# Patient Record
Sex: Female | Born: 1980 | Race: White | Hispanic: No | Marital: Single | Smoking: Never smoker
Health system: Southern US, Community
[De-identification: ages and names within clinical notes are randomized; demographics above are authoritative.]

## PROBLEM LIST (undated history)

## (undated) DIAGNOSIS — E559 Vitamin D deficiency, unspecified: Secondary | ICD-10-CM

## (undated) DIAGNOSIS — M5481 Occipital neuralgia: Secondary | ICD-10-CM

## (undated) DIAGNOSIS — G43909 Migraine, unspecified, not intractable, without status migrainosus: Secondary | ICD-10-CM

## (undated) DIAGNOSIS — D649 Anemia, unspecified: Secondary | ICD-10-CM

## (undated) DIAGNOSIS — D509 Iron deficiency anemia, unspecified: Secondary | ICD-10-CM

## (undated) DIAGNOSIS — R9431 Abnormal electrocardiogram [ECG] [EKG]: Secondary | ICD-10-CM

## (undated) DIAGNOSIS — Z87442 Personal history of urinary calculi: Secondary | ICD-10-CM

## (undated) DIAGNOSIS — N200 Calculus of kidney: Secondary | ICD-10-CM

## (undated) DIAGNOSIS — N938 Other specified abnormal uterine and vaginal bleeding: Secondary | ICD-10-CM

## (undated) DIAGNOSIS — K589 Irritable bowel syndrome without diarrhea: Secondary | ICD-10-CM

## (undated) DIAGNOSIS — E669 Obesity, unspecified: Secondary | ICD-10-CM

## (undated) DIAGNOSIS — Z9889 Other specified postprocedural states: Secondary | ICD-10-CM

## (undated) DIAGNOSIS — G43119 Migraine with aura, intractable, without status migrainosus: Secondary | ICD-10-CM

## (undated) DIAGNOSIS — R112 Nausea with vomiting, unspecified: Secondary | ICD-10-CM

## (undated) DIAGNOSIS — T8859XA Other complications of anesthesia, initial encounter: Secondary | ICD-10-CM

## (undated) DIAGNOSIS — E041 Nontoxic single thyroid nodule: Secondary | ICD-10-CM

## (undated) DIAGNOSIS — R011 Cardiac murmur, unspecified: Secondary | ICD-10-CM

## (undated) DIAGNOSIS — Z87448 Personal history of other diseases of urinary system: Secondary | ICD-10-CM

## (undated) HISTORY — DX: Obesity, unspecified: E66.9

## (undated) HISTORY — DX: Calculus of kidney: N20.0

## (undated) HISTORY — DX: Irritable bowel syndrome, unspecified: K58.9

## (undated) HISTORY — PX: URETHRAL STRICTURE DILATATION: SHX477

## (undated) HISTORY — PX: LITHOTRIPSY: SUR834

## (undated) HISTORY — DX: Vitamin D deficiency, unspecified: E55.9

## (undated) HISTORY — DX: Migraine, unspecified, not intractable, without status migrainosus: G43.909

---

## 2004-10-28 ENCOUNTER — Observation Stay: Payer: Self-pay | Admitting: Internal Medicine

## 2005-01-22 ENCOUNTER — Emergency Department (HOSPITAL_COMMUNITY): Admission: EM | Admit: 2005-01-22 | Discharge: 2005-01-22 | Payer: Self-pay | Admitting: Family Medicine

## 2005-02-03 ENCOUNTER — Emergency Department (HOSPITAL_COMMUNITY): Admission: EM | Admit: 2005-02-03 | Discharge: 2005-02-03 | Payer: Self-pay | Admitting: Family Medicine

## 2005-07-22 ENCOUNTER — Ambulatory Visit: Payer: Self-pay | Admitting: Gastroenterology

## 2006-12-29 ENCOUNTER — Emergency Department (HOSPITAL_COMMUNITY): Admission: EM | Admit: 2006-12-29 | Discharge: 2006-12-29 | Payer: Self-pay | Admitting: Family Medicine

## 2008-01-31 ENCOUNTER — Emergency Department (HOSPITAL_COMMUNITY): Admission: EM | Admit: 2008-01-31 | Discharge: 2008-01-31 | Payer: Self-pay | Admitting: Emergency Medicine

## 2009-08-26 ENCOUNTER — Emergency Department (HOSPITAL_COMMUNITY): Admission: EM | Admit: 2009-08-26 | Discharge: 2009-08-26 | Payer: Self-pay | Admitting: Family Medicine

## 2010-06-17 ENCOUNTER — Ambulatory Visit: Payer: Self-pay | Admitting: Internal Medicine

## 2010-07-21 ENCOUNTER — Other Ambulatory Visit (HOSPITAL_COMMUNITY): Payer: Self-pay | Admitting: Oncology

## 2010-07-21 ENCOUNTER — Encounter (HOSPITAL_BASED_OUTPATIENT_CLINIC_OR_DEPARTMENT_OTHER): Payer: 59 | Admitting: Oncology

## 2010-07-21 ENCOUNTER — Encounter: Payer: Self-pay | Admitting: Oncology

## 2010-07-21 DIAGNOSIS — D509 Iron deficiency anemia, unspecified: Secondary | ICD-10-CM

## 2010-07-21 LAB — LACTATE DEHYDROGENASE: LDH: 140 U/L (ref 94–250)

## 2010-07-21 LAB — CBC & DIFF AND RETIC
Basophils Absolute: 0.1 10*3/uL (ref 0.0–0.1)
EOS%: 1.6 % (ref 0.0–7.0)
LYMPH%: 20 % (ref 14.0–49.7)
MCH: 21.2 pg — ABNORMAL LOW (ref 25.1–34.0)
MCV: 71.2 fL — ABNORMAL LOW (ref 79.5–101.0)
MONO%: 4.5 % (ref 0.0–14.0)
Platelets: 388 10*3/uL (ref 145–400)
RBC: 4.96 10*6/uL (ref 3.70–5.45)
RDW: 17.9 % — ABNORMAL HIGH (ref 11.2–14.5)
Retic %: 1.05 % (ref 0.50–1.50)
Retic Ct Abs: 52.08 10*3/uL (ref 18.30–72.70)

## 2010-07-21 LAB — MORPHOLOGY: PLT EST: ADEQUATE

## 2010-07-21 LAB — COMPREHENSIVE METABOLIC PANEL
ALT: 13 U/L (ref 0–35)
AST: 18 U/L (ref 0–37)
Albumin: 4.4 g/dL (ref 3.5–5.2)
CO2: 26 mEq/L (ref 19–32)
Calcium: 9.6 mg/dL (ref 8.4–10.5)
Chloride: 105 mEq/L (ref 96–112)
Creatinine, Ser: 0.82 mg/dL (ref 0.40–1.20)
Potassium: 3.7 mEq/L (ref 3.5–5.3)

## 2010-07-21 LAB — IRON AND TIBC: UIBC: 421 ug/dL

## 2010-09-15 ENCOUNTER — Ambulatory Visit: Payer: Self-pay

## 2010-09-15 ENCOUNTER — Other Ambulatory Visit: Payer: Self-pay | Admitting: Occupational Medicine

## 2010-09-15 DIAGNOSIS — R52 Pain, unspecified: Secondary | ICD-10-CM

## 2010-12-03 ENCOUNTER — Other Ambulatory Visit (HOSPITAL_COMMUNITY): Payer: Self-pay | Admitting: Orthopedic Surgery

## 2010-12-03 DIAGNOSIS — M25532 Pain in left wrist: Secondary | ICD-10-CM

## 2010-12-09 ENCOUNTER — Other Ambulatory Visit (HOSPITAL_COMMUNITY): Payer: Self-pay | Admitting: Orthopedic Surgery

## 2010-12-09 DIAGNOSIS — M25532 Pain in left wrist: Secondary | ICD-10-CM

## 2010-12-10 ENCOUNTER — Ambulatory Visit (HOSPITAL_COMMUNITY)
Admission: RE | Admit: 2010-12-10 | Discharge: 2010-12-10 | Disposition: A | Discharge status: Home / Self Care | Payer: PRIVATE HEALTH INSURANCE | Source: Ambulatory Visit | Attending: Orthopedic Surgery | Admitting: Orthopedic Surgery

## 2010-12-10 ENCOUNTER — Other Ambulatory Visit (HOSPITAL_COMMUNITY): Payer: Self-pay | Admitting: Orthopedic Surgery

## 2010-12-10 DIAGNOSIS — M25532 Pain in left wrist: Secondary | ICD-10-CM

## 2010-12-10 DIAGNOSIS — M8569 Other cyst of bone, multiple sites: Secondary | ICD-10-CM | POA: Insufficient documentation

## 2010-12-10 DIAGNOSIS — M25539 Pain in unspecified wrist: Secondary | ICD-10-CM | POA: Insufficient documentation

## 2010-12-10 MED ORDER — GADOBENATE DIMEGLUMINE 529 MG/ML IV SOLN
0.5000 mL | Freq: Once | INTRAVENOUS | Status: AC | PRN
  Administered 2010-12-10: 0.5 mL via INTRAVENOUS

## 2010-12-10 MED ORDER — IOHEXOL 300 MG/ML  SOLN
10.0000 mL | Freq: Once | INTRAMUSCULAR | Status: AC | PRN
  Administered 2010-12-10: 10 mL via INTRAVENOUS

## 2011-03-09 LAB — POCT URINALYSIS DIP (DEVICE)
Ketones, ur: 15 — AB
Protein, ur: NEGATIVE
pH: 6

## 2011-03-09 LAB — URINE CULTURE

## 2011-03-11 ENCOUNTER — Inpatient Hospital Stay (INDEPENDENT_AMBULATORY_CARE_PROVIDER_SITE_OTHER)
Admission: RE | Admit: 2011-03-11 | Discharge: 2011-03-11 | Disposition: A | Discharge status: Home / Self Care | Payer: PRIVATE HEALTH INSURANCE | Source: Ambulatory Visit | Attending: Family Medicine | Admitting: Family Medicine

## 2011-03-11 DIAGNOSIS — M79609 Pain in unspecified limb: Secondary | ICD-10-CM

## 2011-03-13 ENCOUNTER — Other Ambulatory Visit (HOSPITAL_COMMUNITY): Payer: Self-pay | Admitting: Specialist

## 2011-03-13 DIAGNOSIS — M2390 Unspecified internal derangement of unspecified knee: Secondary | ICD-10-CM

## 2011-03-16 ENCOUNTER — Ambulatory Visit (HOSPITAL_COMMUNITY)
Admission: RE | Admit: 2011-03-16 | Discharge: 2011-03-16 | Disposition: A | Discharge status: Home / Self Care | Payer: 59 | Source: Ambulatory Visit | Attending: Specialist | Admitting: Specialist

## 2011-03-16 DIAGNOSIS — M7120 Synovial cyst of popliteal space [Baker], unspecified knee: Secondary | ICD-10-CM | POA: Insufficient documentation

## 2011-03-16 DIAGNOSIS — W19XXXA Unspecified fall, initial encounter: Secondary | ICD-10-CM | POA: Insufficient documentation

## 2011-03-16 DIAGNOSIS — M2240 Chondromalacia patellae, unspecified knee: Secondary | ICD-10-CM | POA: Insufficient documentation

## 2011-03-16 DIAGNOSIS — M25569 Pain in unspecified knee: Secondary | ICD-10-CM | POA: Insufficient documentation

## 2011-03-16 DIAGNOSIS — S8000XA Contusion of unspecified knee, initial encounter: Secondary | ICD-10-CM | POA: Insufficient documentation

## 2011-03-16 DIAGNOSIS — M25469 Effusion, unspecified knee: Secondary | ICD-10-CM | POA: Insufficient documentation

## 2011-03-16 DIAGNOSIS — M2390 Unspecified internal derangement of unspecified knee: Secondary | ICD-10-CM

## 2011-04-10 ENCOUNTER — Other Ambulatory Visit: Payer: Self-pay | Admitting: Otolaryngology

## 2011-04-23 ENCOUNTER — Encounter (HOSPITAL_BASED_OUTPATIENT_CLINIC_OR_DEPARTMENT_OTHER): Payer: Self-pay | Admitting: *Deleted

## 2011-04-23 NOTE — Progress Notes (Signed)
To [facility] at 1100.Hg,urine pregnancy on arrival.npo after mn-clear liquid only until 0700.

## 2011-04-24 ENCOUNTER — Encounter (HOSPITAL_BASED_OUTPATIENT_CLINIC_OR_DEPARTMENT_OTHER): Payer: Self-pay | Admitting: Anesthesiology

## 2011-04-24 ENCOUNTER — Ambulatory Visit (HOSPITAL_BASED_OUTPATIENT_CLINIC_OR_DEPARTMENT_OTHER): Payer: 59 | Admitting: Anesthesiology

## 2011-04-24 ENCOUNTER — Encounter (HOSPITAL_BASED_OUTPATIENT_CLINIC_OR_DEPARTMENT_OTHER): Payer: Self-pay | Admitting: *Deleted

## 2011-04-24 ENCOUNTER — Ambulatory Visit (HOSPITAL_BASED_OUTPATIENT_CLINIC_OR_DEPARTMENT_OTHER)
Admission: RE | Admit: 2011-04-24 | Discharge: 2011-04-24 | Disposition: A | Discharge status: Home / Self Care | Payer: 59 | Source: Ambulatory Visit | Attending: Specialist | Admitting: Specialist

## 2011-04-24 ENCOUNTER — Encounter (HOSPITAL_BASED_OUTPATIENT_CLINIC_OR_DEPARTMENT_OTHER)
Admission: RE | Disposition: A | Discharge status: Home / Self Care | Payer: Self-pay | Source: Ambulatory Visit | Attending: Specialist

## 2011-04-24 DIAGNOSIS — R011 Cardiac murmur, unspecified: Secondary | ICD-10-CM | POA: Insufficient documentation

## 2011-04-24 DIAGNOSIS — IMO0002 Reserved for concepts with insufficient information to code with codable children: Secondary | ICD-10-CM | POA: Insufficient documentation

## 2011-04-24 DIAGNOSIS — Z79899 Other long term (current) drug therapy: Secondary | ICD-10-CM | POA: Insufficient documentation

## 2011-04-24 DIAGNOSIS — M2240 Chondromalacia patellae, unspecified knee: Secondary | ICD-10-CM | POA: Insufficient documentation

## 2011-04-24 DIAGNOSIS — W19XXXS Unspecified fall, sequela: Secondary | ICD-10-CM | POA: Insufficient documentation

## 2011-04-24 DIAGNOSIS — IMO0001 Reserved for inherently not codable concepts without codable children: Secondary | ICD-10-CM | POA: Insufficient documentation

## 2011-04-24 DIAGNOSIS — Y9289 Other specified places as the place of occurrence of the external cause: Secondary | ICD-10-CM | POA: Insufficient documentation

## 2011-04-24 DIAGNOSIS — Y939 Activity, unspecified: Secondary | ICD-10-CM | POA: Insufficient documentation

## 2011-04-24 DIAGNOSIS — W19XXXA Unspecified fall, initial encounter: Secondary | ICD-10-CM | POA: Insufficient documentation

## 2011-04-24 DIAGNOSIS — S83209A Unspecified tear of unspecified meniscus, current injury, unspecified knee, initial encounter: Secondary | ICD-10-CM

## 2011-04-24 HISTORY — DX: Anemia, unspecified: D64.9

## 2011-04-24 HISTORY — PX: KNEE ARTHROSCOPY: SHX127

## 2011-04-24 HISTORY — DX: Cardiac murmur, unspecified: R01.1

## 2011-04-24 LAB — POCT HEMOGLOBIN-HEMACUE: Hemoglobin: 11.7 g/dL — ABNORMAL LOW (ref 12.0–15.0)

## 2011-04-24 SURGERY — ARTHROSCOPY, KNEE
Anesthesia: General | Site: Knee | Laterality: Right | Wound class: Clean

## 2011-04-24 MED ORDER — ONDANSETRON HCL 4 MG/2ML IJ SOLN
INTRAMUSCULAR | Status: DC | PRN
  Administered 2011-04-24: 4 mg via INTRAVENOUS

## 2011-04-24 MED ORDER — FENTANYL CITRATE 0.05 MG/ML IJ SOLN: 25.0000 ug | INTRAMUSCULAR | Status: DC | PRN

## 2011-04-24 MED ORDER — PROPOFOL 10 MG/ML IV EMUL
INTRAVENOUS | Status: DC | PRN
  Administered 2011-04-24: 300 mg via INTRAVENOUS

## 2011-04-24 MED ORDER — PROMETHAZINE HCL 25 MG/ML IJ SOLN
6.2500 mg | INTRAMUSCULAR | Status: DC | PRN
  Administered 2011-04-24: 6.25 mg via INTRAVENOUS

## 2011-04-24 MED ORDER — DEXAMETHASONE SODIUM PHOSPHATE 4 MG/ML IJ SOLN
INTRAMUSCULAR | Status: DC | PRN
  Administered 2011-04-24: 4 mg via INTRAVENOUS

## 2011-04-24 MED ORDER — CHLORHEXIDINE GLUCONATE 4 % EX LIQD: 60.0000 mL | Freq: Once | CUTANEOUS | Status: DC

## 2011-04-24 MED ORDER — KETOROLAC TROMETHAMINE 30 MG/ML IJ SOLN: 15.0000 mg | Freq: Once | INTRAMUSCULAR | Status: DC | PRN

## 2011-04-24 MED ORDER — LACTATED RINGERS IV SOLN
INTRAVENOUS | Status: DC
  Administered 2011-04-24 (×2): via INTRAVENOUS

## 2011-04-24 MED ORDER — BUPIVACAINE-EPINEPHRINE 0.5% -1:200000 IJ SOLN
INTRAMUSCULAR | Status: DC | PRN
  Administered 2011-04-24: 30 mL

## 2011-04-24 MED ORDER — MIDAZOLAM HCL 5 MG/5ML IJ SOLN
INTRAMUSCULAR | Status: DC | PRN
  Administered 2011-04-24: 2 mg via INTRAVENOUS

## 2011-04-24 MED ORDER — LIDOCAINE HCL (CARDIAC) 20 MG/ML IV SOLN
INTRAVENOUS | Status: DC | PRN
  Administered 2011-04-24: 80 mg via INTRAVENOUS

## 2011-04-24 MED ORDER — SODIUM CHLORIDE 0.9 % IR SOLN
Status: DC | PRN
  Administered 2011-04-24: 3000 mL

## 2011-04-24 MED ORDER — FENTANYL CITRATE 0.05 MG/ML IJ SOLN
INTRAMUSCULAR | Status: DC | PRN
  Administered 2011-04-24 (×4): 50 ug via INTRAVENOUS

## 2011-04-24 MED ORDER — CEFAZOLIN SODIUM 1-5 GM-% IV SOLN
1.0000 g | INTRAVENOUS | Status: AC
  Administered 2011-04-24: 2 g via INTRAVENOUS

## 2011-04-24 MED ORDER — KETOROLAC TROMETHAMINE 30 MG/ML IJ SOLN
INTRAMUSCULAR | Status: DC | PRN
  Administered 2011-04-24: 30 mg via INTRAVENOUS

## 2011-04-24 MED ORDER — SODIUM CHLORIDE 0.9 % IR SOLN
Status: DC | PRN
  Administered 2011-04-24: 13:00:00

## 2011-04-24 SURGICAL SUPPLY — 37 items
BANDAGE ELASTIC 6 VELCRO ST LF (GAUZE/BANDAGES/DRESSINGS) ×2 IMPLANT
BLADE 4.2CUDA (BLADE) IMPLANT
BLADE CUDA SHAVER 3.5 (BLADE) ×2 IMPLANT
BLADE GREAT WHITE 4.2 (BLADE) IMPLANT
BOOTIES KNEE HIGH SLOAN (MISCELLANEOUS) ×2 IMPLANT
CANISTER SUCT LVC 12 LTR MEDI- (MISCELLANEOUS) ×2 IMPLANT
CANISTER SUCTION 1200CC (MISCELLANEOUS) IMPLANT
CANISTER SUCTION 2500CC (MISCELLANEOUS) IMPLANT
CANNULA ACUFLEX KIT 5X76 (CANNULA) IMPLANT
CLOTH BEACON ORANGE TIMEOUT ST (SAFETY) ×2 IMPLANT
CUTTER MENISCUS  4.2MM (BLADE)
CUTTER MENISCUS 4.2MM (BLADE) IMPLANT
DRAPE ARTHROSCOPY W/POUCH 114 (DRAPES) ×2 IMPLANT
DURAPREP 26ML APPLICATOR (WOUND CARE) ×2 IMPLANT
ELECT REM PT RETURN 9FT ADLT (ELECTROSURGICAL)
ELECTRODE REM PT RTRN 9FT ADLT (ELECTROSURGICAL) IMPLANT
GLOVE SURG SS PI 8.0 STRL IVOR (GLOVE) ×2 IMPLANT
GOWN PREVENTION PLUS LG XLONG (DISPOSABLE) ×2 IMPLANT
GOWN STRL REIN XL XLG (GOWN DISPOSABLE) ×2 IMPLANT
KNEE WRAP E Z 3 GEL PACK (MISCELLANEOUS) ×2 IMPLANT
MINI VAC (SURGICAL WAND) IMPLANT
NDL SAFETY ECLIPSE 18X1.5 (NEEDLE) ×2 IMPLANT
NEEDLE FILTER BLUNT 18X 1/2SAF (NEEDLE) ×1
NEEDLE FILTER BLUNT 18X1 1/2 (NEEDLE) ×1 IMPLANT
NEEDLE HYPO 18GX1.5 SHARP (NEEDLE) ×2
PACK ARTHROSCOPY DSU (CUSTOM PROCEDURE TRAY) ×2 IMPLANT
PACK BASIN DAY SURGERY FS (CUSTOM PROCEDURE TRAY) ×2 IMPLANT
PADDING CAST COTTON 6X4 STRL (CAST SUPPLIES) ×2 IMPLANT
SET ARTHROSCOPY TUBING (MISCELLANEOUS) ×1
SET ARTHROSCOPY TUBING LN (MISCELLANEOUS) ×1 IMPLANT
SPONGE GAUZE 4X4 12PLY (GAUZE/BANDAGES/DRESSINGS) ×2 IMPLANT
SUT ETHILON 4 0 PS 2 18 (SUTURE) ×2 IMPLANT
SYR 30ML LL (SYRINGE) ×2 IMPLANT
SYRINGE 10CC LL (SYRINGE) ×2 IMPLANT
TOWEL OR 17X24 6PK STRL BLUE (TOWEL DISPOSABLE) ×2 IMPLANT
WAND 90 DEG TURBOVAC W/CORD (SURGICAL WAND) IMPLANT
WATER STERILE IRR 500ML POUR (IV SOLUTION) ×2 IMPLANT

## 2011-04-24 NOTE — Anesthesia Procedure Notes (Addendum)
Procedure Name: LMA Insertion Performed by: Otie Headlee, Melessa Pre-anesthesia Checklist: Patient identified, Emergency Drugs available, Suction available and Patient being monitored Patient Re-evaluated:Patient Re-evaluated prior to inductionOxygen Delivery Method: Circle System Utilized Preoxygenation: Pre-oxygenation with 100% oxygen Intubation Type: IV induction Ventilation: Mask ventilation without difficulty LMA: LMA inserted LMA Size: 4.0 Number of attempts: 1 Airway Equipment and Method: bite block Placement Confirmation: positive ETCO2 Dental Injury: Teeth and Oropharynx as per pre-operative assessment      

## 2011-04-24 NOTE — Brief Op Note (Signed)
04/24/2011  2:59 PM  PATIENT:  Tina Fleming  30 y.o. female  PRE-OPERATIVE DIAGNOSIS:  MENISCAL TEAR RIGHT KNEE  POST-OPERATIVE DIAGNOSIS:  MENISCAL TEAR RIGHT KNEE  PROCEDURE:  Procedure(s): ARTHROSCOPY KNEE  SURGEON:  Surgeon(s): [employer]  PHYSICIAN ASSISTANT:   ASSISTANTS: none   ANESTHESIA:   general  EBL:  Total I/O In: 500 [I.V.:500] Out: -       LOCAL MEDICATIONS USED:  MARCAINE 25CC        TOURNIQUET:  * No tourniquets in log *  DICTATION: .Other Dictation: Dictation Number [id number]  PLAN OF CARE: Discharge to home after PACU  PATIENT DISPOSITION:  PACU - hemodynamically stable.   Delay start of Pharmacological VTE agent (>24hrs) due to surgical blood loss or risk of bleeding:  {YES/NO/NOT APPLICABLE:20182

## 2011-04-24 NOTE — Transfer of Care (Signed)
Immediate Anesthesia Transfer of Care Note  Patient: Tina Fleming  Procedure(s) Performed:  ARTHROSCOPY KNEE - /Arthroscopy with debridement, right knee  Patient Location: PACU  Anesthesia Type: General  Level of Consciousness: awake and oriented  Airway & Oxygen Therapy: Patient Spontanous Breathing and Patient connected to nasal cannula oxygen  Post-op Assessment: Report given to PACU RN  Post vital signs: Reviewed and stable  Complications: No apparent anesthesia complications

## 2011-04-24 NOTE — H&P (Signed)
Tina Fleming is an 30 y.o. female.   Chief Complaint: right knee pain HPI: 30 yo meniscus tear right knee refractory  Past Medical History  Diagnosis Date  . Heart murmur     asymtomatic  . Anemia     iron suppliments in past-not currently    Past Surgical History  Procedure Date  . 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 No pertinent family history. Social History:  reports that she has never smoked. She does not have any smokeless tobacco history on file. She reports that she does not drink alcohol or use illicit drugs.  Allergies:  Allergies  Allergen Reactions  . Sulfa Antibiotics Rash    Medications Prior to Admission  Medication Dose Route Frequency Provider Last Rate Last Dose  . 1 mL EPINEPHrine 1 mg/mL (1:1000) in 0.9% Normal Saline 3000 mL irrigation    PRN Javier Docker      . ceFAZolin (ANCEF) IVPB 1 g/50 mL premix  1 g Intravenous 60 min Pre-Op Liam Graham, PA      . chlorhexidine (HIBICLENS) 4 % liquid 4 application  60 mL Topical Once Liam Graham, PA      . lactated ringers infusion   Intravenous Continuous Liam Graham, PA 50 mL/hr at 04/24/11 1226     Medications Prior to Admission  Medication Sig Dispense Refill  . HYDROcodone-acetaminophen (NORCO) 5-325 MG per tablet Take 1 tablet by mouth every 6 (six) hours as needed.          Results for orders placed during the hospital encounter of 04/24/11 (from the past 48 hour(s))  POCT PREGNANCY, URINE     Status: Normal   Collection Time   04/24/11 11:48 AM      Component Value Range Comment   Preg Test, Ur NEGATIVE     POCT HEMOGLOBIN-HEMACUE     Status: Abnormal   Collection Time   04/24/11 12:32 PM      Component Value Range Comment   Hemoglobin 11.7 (*) 12.0 - 15.0 (g/dL)    No results found.  Review of Systems  Constitutional: Negative.   HENT: Negative.   Eyes: Negative.   Respiratory: Negative.   Cardiovascular: Negative.     Gastrointestinal: Negative.   Musculoskeletal: Positive for joint pain.  Skin: Negative.   Neurological: Negative.   Endo/Heme/Allergies: Negative.   Psychiatric/Behavioral: Negative.     Blood pressure 129/81, pulse 72, temperature 98.2 F (36.8 C), temperature source Oral, resp. rate 18, height 5\' 5"  (1.651 m), weight 104.327 kg (230 lb), last menstrual period 03/27/2011, SpO2 98.00%. Physical Exam  Constitutional: She appears well-developed.  HENT:  Head: Normocephalic.  Eyes: Pupils are equal, round, and reactive to light.  Neck: Normal range of motion.  Cardiovascular: Normal rate.   Respiratory: Effort normal.  GI: Soft.  Musculoskeletal: She exhibits tenderness.       Pain medial joint line. Positive McMurray. NVI. Effusion.  Neurological: She is alert.  Skin: Skin is warm.  Psychiatric: She has a normal mood and affect.     Assessment/Plan Right knee meniscus tear. Plan Knee Arthroscopy. Risks and benefits discussed.  Margaret Staggs C 04/24/2011, 1:47 PM

## 2011-04-24 NOTE — Anesthesia Preprocedure Evaluation (Signed)
Anesthesia Evaluation  Patient identified by MRN, date of birth, ID band Patient awake    Reviewed: Allergy & Precautions, H&P , NPO status , Patient's Chart, lab work & pertinent test results  Airway Mallampati: II TM Distance: <3 FB Neck ROM: Full    Dental No notable dental hx.    Pulmonary neg pulmonary ROS,  clear to auscultation  Pulmonary exam normal       Cardiovascular neg cardio ROS Regular Normal    Neuro/Psych Negative Neurological ROS  Negative Psych ROS   GI/Hepatic negative GI ROS, Neg liver ROS,   Endo/Other  Negative Endocrine ROSMorbid obesity  Renal/GU negative Renal ROS  Genitourinary negative   Musculoskeletal negative musculoskeletal ROS (+)   Abdominal   Peds negative pediatric ROS (+)  Hematology negative hematology ROS (+)   Anesthesia Other Findings   Reproductive/Obstetrics negative OB ROS                           Anesthesia Physical Anesthesia Plan  ASA: II  Anesthesia Plan: General   Post-op Pain Management:    Induction: Intravenous  Airway Management Planned: LMA  Additional Equipment:   Intra-op Plan:   Post-operative Plan:   Informed Consent: I have reviewed the patients History and Physical, chart, labs and discussed the procedure including the risks, benefits and alternatives for the proposed anesthesia with the patient or authorized representative who has indicated his/her understanding and acceptance.   Dental advisory given  Plan Discussed with: CRNA  Anesthesia Plan Comments:         Anesthesia Quick Evaluation

## 2011-04-27 ENCOUNTER — Encounter (HOSPITAL_BASED_OUTPATIENT_CLINIC_OR_DEPARTMENT_OTHER): Payer: Self-pay | Admitting: Specialist

## 2011-04-27 NOTE — Anesthesia Postprocedure Evaluation (Signed)
  Anesthesia Post-op Note  Patient: Tina Fleming  Procedure(s) Performed:  ARTHROSCOPY KNEE - /Arthroscopy with debridement, right knee  Patient Location: PACU  Anesthesia Type: General  Level of Consciousness: awake and alert   Airway and Oxygen Therapy: Patient Spontanous Breathing  Post-op Pain: mild  Post-op Assessment: Post-op Vital signs reviewed, Patient's Cardiovascular Status Stable, Respiratory Function Stable, Patent Airway and No signs of Nausea or vomiting  Post-op Vital Signs: stable  Complications: No apparent anesthesia complications

## 2011-04-27 NOTE — Op Note (Signed)
NAME:  Tina Fleming, Tina Fleming                  ACCOUNT NO.:  MEDICAL RECORD NO.:  [id number]  LOCATION:                                 FACILITY:  PHYSICIAN:  Jene Every, M.D.    DATE OF BIRTH:  [birth year]  DATE OF PROCEDURE:  04/24/2011 DATE OF DISCHARGE:                              OPERATIVE REPORT   PREOPERATIVE DIAGNOSIS:  Post-traumatic chondromalacia of the right knee, possible osteochondral loose fragment, meniscus tear.  POSTOPERATIVE DIAGNOSIS:  Post-traumatic chondromalacia of the right knee, possible osteochondral loose fragment, meniscus tear.  PROCEDURE PERFORMED: 1. Right knee arthroscopy. 2. Chondroplasty of the patella and medial femoral condyle,     synovectomy.  ANESTHESIA:  General.  ASSISTANT:  None.  BRIEF HISTORY:  This is a female who is status post a fall at work.  She had persistent pain in the knee locking and giving way.  She had an x- ray and MRI indicating a possible osteochondral fragment medial femoral condyle.  She had patellofemoral pain, medial joint line, mechanical symptoms, possible meniscal pathology.  She was indicated for arthroscopic debridement, evaluation, removal of loose bodies noted, meniscectomy.  Risks and benefits were discussed including bleeding, infection, damage to neurovascular structures, no change in symptoms, worsening symptoms, need for repeat debridement, DVT, PE, anesthetic complications, etc.  TECHNIQUE:  The patient in supine position, after induction of adequate anesthesia, 2 g Kefzol, the right lower extremity was prepped and draped in the usual sterile fashion.  A lateral parapatellar portal and superior medial parapatellar portal were fashioned with an #11 blade. Ingress cannula atraumatically placed.  Irrigant was utilized to insufflate the joint.  Under direct visualization, a medial parapatellar portal was fashioned with an #11 blade after localization with 18 gauge needle sparing the medial meniscus.   Noted on the medial compartment was a minor grade 3 changes of the femoral condyle in the medial superior area and then region where the previous fragment was noted on x-ray. Light chondroplasty performed here, it appeared that this was beneath the chondral surface in the periosteum, this appeared almost emanating as an osteophyte.  Certainly, it was not free or within the joint itself.  Therefore, moved to the center of the joint, examined the meniscus in its entirety.  There was some mild fraying, which was shaved.  The synovitis in medial compartment and then partial synovectomy performed.  ACL and PCL were unremarkable.  Lateral compartment of normal femoral condyle, tibial plateau, and meniscus stable to probe palpation.  Suprapatellar pouch revealed extensive grade 3 changes of the patella.  There was normal patellofemoral tracking.  Light chondroplasty performed above the patella, loose cartilaginous debris removed, gutters unremarkable.  Reexamined all compartments.  No further pathology amenable to arthroscopic intervention.  Reexamined the whole medial condyle and it was in the whole joint as well as posteriorly, I did not feel there was any loose cartilaginous body and we copiously lavaged the knee, all instrumentation was removed.  Portals were closed with 4-0 nylon simple sutures, 0.25% Marcaine with epinephrine was infiltrated in joint. Wound was dressed sterilely.  Awoken without difficulty and transported to recovery in satisfactory condition.  The patient tolerated the procedure  well.  No complications.  Minimal blood loss.     Jene Every, M.D.     Cordelia Pen  D:  04/24/2011  T:  04/25/2011  Job:  [id number]

## 2011-05-11 ENCOUNTER — Ambulatory Visit: Payer: 59 | Attending: Specialist | Admitting: Physical Therapy

## 2011-05-11 DIAGNOSIS — IMO0001 Reserved for inherently not codable concepts without codable children: Secondary | ICD-10-CM | POA: Insufficient documentation

## 2011-05-11 DIAGNOSIS — M25569 Pain in unspecified knee: Secondary | ICD-10-CM | POA: Insufficient documentation

## 2011-05-15 ENCOUNTER — Ambulatory Visit: Payer: 59 | Admitting: Physical Therapy

## 2011-05-21 ENCOUNTER — Ambulatory Visit: Payer: 59 | Admitting: Physical Therapy

## 2012-03-20 ENCOUNTER — Encounter (HOSPITAL_COMMUNITY): Payer: Self-pay | Admitting: Emergency Medicine

## 2012-03-20 ENCOUNTER — Observation Stay (HOSPITAL_COMMUNITY)
Admission: EM | Admit: 2012-03-20 | Discharge: 2012-03-22 | Disposition: A | Discharge status: Home / Self Care | Payer: 59 | Attending: General Surgery | Admitting: General Surgery

## 2012-03-20 DIAGNOSIS — K37 Unspecified appendicitis: Secondary | ICD-10-CM

## 2012-03-20 DIAGNOSIS — K3580 Unspecified acute appendicitis: Principal | ICD-10-CM | POA: Insufficient documentation

## 2012-03-20 DIAGNOSIS — R1033 Periumbilical pain: Secondary | ICD-10-CM | POA: Insufficient documentation

## 2012-03-20 LAB — URINALYSIS, MICROSCOPIC ONLY
Bilirubin Urine: NEGATIVE
Hgb urine dipstick: NEGATIVE
Protein, ur: NEGATIVE mg/dL
Specific Gravity, Urine: 1.026 (ref 1.005–1.030)
Urobilinogen, UA: 0.2 mg/dL (ref 0.0–1.0)

## 2012-03-20 LAB — CBC WITH DIFFERENTIAL/PLATELET
Basophils Absolute: 0 10*3/uL (ref 0.0–0.1)
HCT: 38 % (ref 36.0–46.0)
Lymphocytes Relative: 6 % — ABNORMAL LOW (ref 12–46)
Neutro Abs: 16.9 10*3/uL — ABNORMAL HIGH (ref 1.7–7.7)
Platelets: 399 10*3/uL (ref 150–400)
RDW: 17.7 % — ABNORMAL HIGH (ref 11.5–15.5)
WBC: 18.8 10*3/uL — ABNORMAL HIGH (ref 4.0–10.5)

## 2012-03-20 LAB — POCT PREGNANCY, URINE: Preg Test, Ur: NEGATIVE

## 2012-03-20 LAB — COMPREHENSIVE METABOLIC PANEL
ALT: 17 U/L (ref 0–35)
AST: 20 U/L (ref 0–37)
CO2: 26 mEq/L (ref 19–32)
Chloride: 101 mEq/L (ref 96–112)
GFR calc non Af Amer: >90 mL/min (ref >90)
Sodium: 137 mEq/L (ref 135–145)
Total Bilirubin: 0.2 mg/dL — ABNORMAL LOW (ref 0.3–1.2)

## 2012-03-20 MED ORDER — ONDANSETRON 4 MG PO TBDP
8.0000 mg | ORAL_TABLET | Freq: Once | ORAL | Status: AC
  Administered 2012-03-20: 8 mg via ORAL
  Filled 2012-03-20: qty 2

## 2012-03-20 MED ORDER — SODIUM CHLORIDE 0.9 % IV BOLUS (SEPSIS)
1000.0000 mL | Freq: Once | INTRAVENOUS | Status: AC
  Administered 2012-03-20: 1000 mL via INTRAVENOUS

## 2012-03-20 MED ORDER — MORPHINE SULFATE 4 MG/ML IJ SOLN
4.0000 mg | Freq: Once | INTRAMUSCULAR | Status: AC
  Administered 2012-03-20: 4 mg via INTRAVENOUS
  Filled 2012-03-20: qty 1

## 2012-03-20 NOTE — ED Notes (Signed)
I&O cath done, clear yellow urine returned, no complications.

## 2012-03-20 NOTE — ED Notes (Signed)
Pt reports having mid abd pain since 230, reports emesis, and having decreased UOP; pt reports hx of kidney stones

## 2012-03-20 NOTE — ED Notes (Signed)
Pt reports lower abdominal pain that started around 2pm today, pt began to have difficulty urinating when pain began.  Pt sts she is not able to urinate at present.  Has had kidney stones in the past, sts this is a different feeling.

## 2012-03-20 NOTE — ED Provider Notes (Signed)
History     CSN: [id number]  Arrival date & time 03/20/12  2123   First MD Initiated Contact with Patient 03/20/12 2301      Chief Complaint  Patient presents with  . Abdominal Pain    (Consider location/radiation/quality/duration/timing/severity/associated sxs/prior treatment) Patient is a 31 y.o. female presenting with abdominal pain. The history is provided by the patient.  Abdominal Pain The primary symptoms of the illness include abdominal pain, nausea, vomiting and dysuria. The primary symptoms of the illness do not include fever, diarrhea, vaginal discharge or vaginal bleeding. The current episode started 6 to 12 hours ago. The onset of the illness was sudden. The problem has not changed since onset. The abdominal pain began 6 to 12 hours ago. The pain came on suddenly. The abdominal pain has been unchanged since its onset. The abdominal pain is located in the periumbilical region. The abdominal pain does not radiate. The severity of the abdominal pain is 10/10. The abdominal pain is relieved by nothing.  The vomiting began today. Vomiting occurs 2 to 5 times per day. The emesis contains stomach contents.  The patient has not had a change in bowel habit. Symptoms associated with the illness do not include chills or constipation. Significant associated medical issues do not include diabetes.    Past Medical History  Diagnosis Date  . Heart murmur     asymtomatic  . Anemia     iron suppliments in past-not currently  . Kidney stone     Past Surgical History  Procedure Date  . 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 Knee arthroscopy 04/24/2011    Procedure: ARTHROSCOPY KNEE;  Surgeon: Javier Docker;  Location: [facility];  Service: Orthopedics;  Laterality: Right;  /Arthroscopy with debridement, right knee    History reviewed. No pertinent family history.  History  Substance Use Topics  . Smoking status: Never Smoker   . Smokeless tobacco:  Not on file  . Alcohol Use: No    OB History    Grav Para Term Preterm Abortions TAB SAB Ect Mult Living                  Review of Systems  Constitutional: Negative for fever and chills.  Gastrointestinal: Positive for nausea, vomiting and abdominal pain. Negative for diarrhea and constipation.  Genitourinary: Positive for dysuria and difficulty urinating. Negative for vaginal bleeding and vaginal discharge.  All other systems reviewed and are negative.    Allergies  Sulfa antibiotics  Home Medications   Current Outpatient Rx  Name Route Sig Dispense Refill  . ADULT MULTIVITAMIN W/MINERALS CH Oral Take 1 tablet by mouth daily.      BP 127/76  Pulse 79  Temp 97.7 F (36.5 C) (Oral)  Resp 18  SpO2 100%  LMP 03/06/2012  Physical Exam  Constitutional: She is oriented to person, place, and time. She appears well-developed and well-nourished. No distress.  HENT:  Head: Normocephalic and atraumatic.  Mouth/Throat: Oropharynx is clear and moist.  Eyes: Conjunctivae normal are normal. Pupils are equal, round, and reactive to light.  Neck: Normal range of motion. Neck supple.  Cardiovascular: Normal rate and regular rhythm.   Pulmonary/Chest: Effort normal and breath sounds normal. She has no wheezes. She has no rales.  Abdominal: Soft. Bowel sounds are normal. There is no tenderness. There is no rebound and no guarding.  Musculoskeletal: Normal range of motion.  Neurological: She is alert and oriented to person,  place, and time. She has normal reflexes.  Skin: Skin is warm and dry.  Psychiatric: She has a normal mood and affect.    ED Course  Procedures (including critical care time)  Labs Reviewed  CBC WITH DIFFERENTIAL - Abnormal; Notable for the following:    WBC 18.8 (*)     RBC 5.21 (*)     Hemoglobin 11.8 (*)     MCV 72.9 (*)     MCH 22.6 (*)     RDW 17.7 (*)     Neutrophils Relative 90 (*)     Neutro Abs 16.9 (*)     Lymphocytes Relative 6 (*)     All  other components within normal limits  COMPREHENSIVE METABOLIC PANEL - Abnormal; Notable for the following:    Glucose, Bld 129 (*)     Total Bilirubin 0.2 (*)     All other components within normal limits  URINALYSIS, MICROSCOPIC ONLY   No results found.   No diagnosis found.    MDM  Admit acute appendicitis       Lasaundra Riche K Hayden Kihara-Rasch, MD 03/21/12 [phone]

## 2012-03-20 NOTE — ED Notes (Signed)
Pt is aware that urine sample is needed, she tells me that she is unable to void and this has been her prolem

## 2012-03-21 ENCOUNTER — Encounter (HOSPITAL_COMMUNITY): Payer: Self-pay | Admitting: Anesthesiology

## 2012-03-21 ENCOUNTER — Encounter (HOSPITAL_COMMUNITY): Payer: Self-pay | Admitting: Radiology

## 2012-03-21 ENCOUNTER — Emergency Department (HOSPITAL_COMMUNITY): Payer: 59

## 2012-03-21 ENCOUNTER — Encounter (HOSPITAL_COMMUNITY)
Admission: EM | Disposition: A | Discharge status: Home / Self Care | Payer: Self-pay | Source: Home / Self Care | Attending: Emergency Medicine

## 2012-03-21 ENCOUNTER — Emergency Department (HOSPITAL_COMMUNITY): Payer: 59 | Admitting: Anesthesiology

## 2012-03-21 DIAGNOSIS — K3580 Unspecified acute appendicitis: Secondary | ICD-10-CM

## 2012-03-21 HISTORY — PX: LAPAROSCOPIC APPENDECTOMY: SHX408

## 2012-03-21 HISTORY — PX: APPENDECTOMY: SHX54

## 2012-03-21 LAB — BASIC METABOLIC PANEL
Chloride: 103 mEq/L (ref 96–112)
GFR calc non Af Amer: >90 mL/min (ref >90)
Glucose, Bld: 110 mg/dL — ABNORMAL HIGH (ref 70–99)
Potassium: 3.6 mEq/L (ref 3.5–5.1)
Sodium: 135 mEq/L (ref 135–145)

## 2012-03-21 LAB — CBC
Hemoglobin: 9.8 g/dL — ABNORMAL LOW (ref 12.0–15.0)
MCHC: 30.9 g/dL (ref 30.0–36.0)
RBC: 4.37 MIL/uL (ref 3.87–5.11)
WBC: 17 10*3/uL — ABNORMAL HIGH (ref 4.0–10.5)

## 2012-03-21 SURGERY — APPENDECTOMY, LAPAROSCOPIC
Anesthesia: General | Site: Abdomen | Wound class: Contaminated

## 2012-03-21 MED ORDER — OXYCODONE-ACETAMINOPHEN 5-325 MG PO TABS
1.0000 | ORAL_TABLET | ORAL | Status: DC | PRN
  Administered 2012-03-21: 2 via ORAL
  Administered 2012-03-21: 1 via ORAL
  Administered 2012-03-22 (×2): 2 via ORAL
  Filled 2012-03-21 (×4): qty 2

## 2012-03-21 MED ORDER — PROMETHAZINE HCL 25 MG/ML IJ SOLN: 12.5000 mg | Freq: Three times a day (TID) | INTRAMUSCULAR | Status: DC | PRN

## 2012-03-21 MED ORDER — GLYCOPYRROLATE 0.2 MG/ML IJ SOLN
INTRAMUSCULAR | Status: DC | PRN
  Administered 2012-03-21: 0.4 mg via INTRAVENOUS

## 2012-03-21 MED ORDER — ONDANSETRON HCL 4 MG PO TABS: 4.0000 mg | ORAL_TABLET | Freq: Four times a day (QID) | ORAL | Status: DC | PRN

## 2012-03-21 MED ORDER — SODIUM CHLORIDE 0.9 % IV BOLUS (SEPSIS)
500.0000 mL | Freq: Once | INTRAVENOUS | Status: AC
  Administered 2012-03-21: 500 mL via INTRAVENOUS

## 2012-03-21 MED ORDER — ONDANSETRON HCL 4 MG/2ML IJ SOLN: 4.0000 mg | Freq: Once | INTRAMUSCULAR | Status: DC | PRN

## 2012-03-21 MED ORDER — HYDROMORPHONE HCL PF 1 MG/ML IJ SOLN
INTRAMUSCULAR | Status: AC
  Filled 2012-03-21: qty 1

## 2012-03-21 MED ORDER — DEXTROSE 5 % IV SOLN
2.0000 g | INTRAVENOUS | Status: DC
  Filled 2012-03-21: qty 2

## 2012-03-21 MED ORDER — ENOXAPARIN SODIUM 40 MG/0.4ML ~~LOC~~ SOLN
40.0000 mg | SUBCUTANEOUS | Status: DC
  Administered 2012-03-21: 40 mg via SUBCUTANEOUS
  Filled 2012-03-21 (×2): qty 0.4

## 2012-03-21 MED ORDER — LIDOCAINE HCL (CARDIAC) 20 MG/ML IV SOLN
INTRAVENOUS | Status: DC | PRN
  Administered 2012-03-21: 60 mg via INTRAVENOUS

## 2012-03-21 MED ORDER — ONDANSETRON HCL 4 MG/2ML IJ SOLN
INTRAMUSCULAR | Status: DC | PRN
  Administered 2012-03-21: 4 mg via INTRAVENOUS

## 2012-03-21 MED ORDER — PIPERACILLIN-TAZOBACTAM 3.375 G IVPB 30 MIN
3.3750 g | Freq: Once | INTRAVENOUS | Status: AC
  Administered 2012-03-21: 3.375 g via INTRAVENOUS
  Filled 2012-03-21: qty 50

## 2012-03-21 MED ORDER — BUPIVACAINE-EPINEPHRINE 0.5% -1:200000 IJ SOLN
INTRAMUSCULAR | Status: DC | PRN
  Administered 2012-03-21: 10 mL

## 2012-03-21 MED ORDER — VECURONIUM BROMIDE 10 MG IV SOLR
INTRAVENOUS | Status: DC | PRN
  Administered 2012-03-21: 1 mg via INTRAVENOUS
  Administered 2012-03-21: 5 mg via INTRAVENOUS

## 2012-03-21 MED ORDER — BUPIVACAINE-EPINEPHRINE (PF) 0.5% -1:200000 IJ SOLN
INTRAMUSCULAR | Status: AC
  Filled 2012-03-21: qty 10

## 2012-03-21 MED ORDER — FENTANYL CITRATE 0.05 MG/ML IJ SOLN
INTRAMUSCULAR | Status: DC | PRN
  Administered 2012-03-21 (×2): 100 ug via INTRAVENOUS
  Administered 2012-03-21 (×2): 75 ug via INTRAVENOUS

## 2012-03-21 MED ORDER — SUCCINYLCHOLINE CHLORIDE 20 MG/ML IJ SOLN
INTRAMUSCULAR | Status: DC | PRN
  Administered 2012-03-21: 120 mg via INTRAVENOUS

## 2012-03-21 MED ORDER — HYDROMORPHONE HCL PF 1 MG/ML IJ SOLN
1.0000 mg | INTRAMUSCULAR | Status: DC | PRN
  Administered 2012-03-21 (×2): 1 mg via INTRAVENOUS
  Administered 2012-03-21: 0.5 mg via INTRAVENOUS
  Filled 2012-03-21 (×2): qty 1

## 2012-03-21 MED ORDER — ONDANSETRON HCL 4 MG/2ML IJ SOLN
4.0000 mg | Freq: Once | INTRAMUSCULAR | Status: AC
  Administered 2012-03-21: 4 mg via INTRAVENOUS
  Filled 2012-03-21: qty 2

## 2012-03-21 MED ORDER — SODIUM CHLORIDE 0.9 % IV SOLN
INTRAVENOUS | Status: DC | PRN
  Administered 2012-03-21: 03:00:00 via INTRAVENOUS

## 2012-03-21 MED ORDER — HYDROMORPHONE HCL PF 1 MG/ML IJ SOLN
0.2500 mg | INTRAMUSCULAR | Status: DC | PRN
  Administered 2012-03-21 (×4): 0.5 mg via INTRAVENOUS

## 2012-03-21 MED ORDER — ONDANSETRON HCL 4 MG/2ML IJ SOLN
4.0000 mg | Freq: Four times a day (QID) | INTRAMUSCULAR | Status: DC | PRN
  Administered 2012-03-21 – 2012-03-22 (×3): 4 mg via INTRAVENOUS
  Filled 2012-03-21 (×3): qty 2

## 2012-03-21 MED ORDER — NEOSTIGMINE METHYLSULFATE 1 MG/ML IJ SOLN
INTRAMUSCULAR | Status: DC | PRN
  Administered 2012-03-21: 2 mg via INTRAVENOUS

## 2012-03-21 MED ORDER — IOHEXOL 300 MG/ML  SOLN
100.0000 mL | Freq: Once | INTRAMUSCULAR | Status: AC | PRN
  Administered 2012-03-21: 100 mL via INTRAVENOUS

## 2012-03-21 MED ORDER — PROPOFOL 10 MG/ML IV BOLUS
INTRAVENOUS | Status: DC | PRN
  Administered 2012-03-21: 200 mg via INTRAVENOUS

## 2012-03-21 MED ORDER — SODIUM CHLORIDE 0.9 % IR SOLN
Status: DC | PRN
  Administered 2012-03-21: 1

## 2012-03-21 MED ORDER — LACTATED RINGERS IV SOLN
INTRAVENOUS | Status: DC | PRN
  Administered 2012-03-21 (×2): via INTRAVENOUS

## 2012-03-21 MED ORDER — SODIUM CHLORIDE 0.9 % IV SOLN
INTRAVENOUS | Status: DC
  Administered 2012-03-21 – 2012-03-22 (×2): via INTRAVENOUS

## 2012-03-21 MED ORDER — IOHEXOL 300 MG/ML  SOLN: 20.0000 mL | INTRAMUSCULAR | Status: AC

## 2012-03-21 SURGICAL SUPPLY — 40 items
APPLIER CLIP ROT 10 11.4 M/L (STAPLE)
BLADE SURG ROTATE 9660 (MISCELLANEOUS) IMPLANT
CANISTER SUCTION 2500CC (MISCELLANEOUS) ×2 IMPLANT
CHLORAPREP W/TINT 26ML (MISCELLANEOUS) ×2 IMPLANT
CLIP APPLIE ROT 10 11.4 M/L (STAPLE) IMPLANT
CLOTH BEACON ORANGE TIMEOUT ST (SAFETY) ×2 IMPLANT
COVER SURGICAL LIGHT HANDLE (MISCELLANEOUS) ×2 IMPLANT
CUTTER FLEX LINEAR 45M (STAPLE) ×2 IMPLANT
DERMABOND ADHESIVE PROPEN (GAUZE/BANDAGES/DRESSINGS) ×1
DERMABOND ADVANCED (GAUZE/BANDAGES/DRESSINGS) ×1
DERMABOND ADVANCED .7 DNX12 (GAUZE/BANDAGES/DRESSINGS) ×1 IMPLANT
DERMABOND ADVANCED .7 DNX6 (GAUZE/BANDAGES/DRESSINGS) ×1 IMPLANT
DRAPE UTILITY 15X26 W/TAPE STR (DRAPE) ×4 IMPLANT
DRAPE WARM FLUID 44X44 (DRAPE) IMPLANT
ELECT REM PT RETURN 9FT ADLT (ELECTROSURGICAL) ×2
ELECTRODE REM PT RTRN 9FT ADLT (ELECTROSURGICAL) ×1 IMPLANT
ENDOLOOP SUT PDS II  0 18 (SUTURE)
ENDOLOOP SUT PDS II 0 18 (SUTURE) IMPLANT
GLOVE BIO SURGEON STRL SZ8 (GLOVE) ×4 IMPLANT
GLOVE BIOGEL PI IND STRL 8 (GLOVE) ×2 IMPLANT
GLOVE BIOGEL PI INDICATOR 8 (GLOVE) ×2
GOWN EXTRA PROTECTION XXL 0583 (GOWNS) ×2 IMPLANT
GOWN STRL NON-REIN LRG LVL3 (GOWN DISPOSABLE) ×2 IMPLANT
KIT BASIN OR (CUSTOM PROCEDURE TRAY) ×2 IMPLANT
KIT ROOM TURNOVER OR (KITS) ×2 IMPLANT
NS IRRIG 1000ML POUR BTL (IV SOLUTION) ×2 IMPLANT
PAD ARMBOARD 7.5X6 YLW CONV (MISCELLANEOUS) ×4 IMPLANT
POUCH SPECIMEN RETRIEVAL 10MM (ENDOMECHANICALS) ×2 IMPLANT
RELOAD STAPLE TA45 3.5 REG BLU (ENDOMECHANICALS) ×2 IMPLANT
SCALPEL HARMONIC ACE (MISCELLANEOUS) ×2 IMPLANT
SET IRRIG TUBING LAPAROSCOPIC (IRRIGATION / IRRIGATOR) ×2 IMPLANT
SPECIMEN JAR SMALL (MISCELLANEOUS) ×2 IMPLANT
SUT MON AB 4-0 PC3 18 (SUTURE) ×2 IMPLANT
TOWEL OR 17X24 6PK STRL BLUE (TOWEL DISPOSABLE) ×2 IMPLANT
TOWEL OR 17X26 10 PK STRL BLUE (TOWEL DISPOSABLE) ×2 IMPLANT
TRAY FOLEY CATH 14FR (SET/KITS/TRAYS/PACK) ×2 IMPLANT
TRAY LAPAROSCOPIC (CUSTOM PROCEDURE TRAY) ×2 IMPLANT
TROCAR XCEL BLADELESS 5X75MML (TROCAR) ×4 IMPLANT
TROCAR XCEL BLUNT TIP 100MML (ENDOMECHANICALS) ×2 IMPLANT
WATER STERILE IRR 1000ML POUR (IV SOLUTION) IMPLANT

## 2012-03-21 NOTE — Op Note (Signed)
Appendectomy, Laparoscopic Procedure Note  Indications: The patient presented with a history of right-sided abdominal pain. A CT revealed findings consistent with acute appendicitis.Options of treatment discussed as well as expectations and complications of each.   Pre-operative Diagnosis: Acute appendicitis without mention of peritonitis  Post-operative Diagnosis: Same  Surgeon: Damascus Feldpausch A.   Assistants: OR   Anesthesia: General endotracheal anesthesia and Local anesthesia 0.25.% bupivacaine, with epinephrine  ASA Class: 1  Procedure Details  The patient was seen again in the Holding Room. The risks, benefits, complications, treatment options, and expected outcomes were discussed with the patient and/or family. The possibilities of reaction to medication, pulmonary aspiration, perforation of viscus, bleeding, recurrent infection, finding a normal appendix, the need for additional procedures, failure to diagnose a condition, and creating a complication requiring transfusion or operation were discussed. There was concurrence with the proposed plan and informed consent was obtained. The site of surgery was properly noted/marked. The patient was taken to Operating Room, identified as Tina Fleming and the procedure verified as Appendectomy. A Time Out was held and the above information confirmed.  The patient was placed in the supine position and general anesthesia was induced, along with placement of orogastric tube, Venodyne boots, and a Foley catheter. The abdomen was prepped and draped in a sterile fashion. A one centimeter infraumbilical incision was made and the peritoneal cavity was accessed using the OPEN  technique. The pneumoperitoneum was then established to steady pressure of 15 mmHg. A 12 mm port was placed through the umbilical incision. Additional 5 mm cannulas then placed in the left lower quadrant of the abdomen and half way between the umbilicus and pubic symphysis under  direct vision. A careful evaluation of the entire abdomen was carried out. The patient was placed in Trendelenburg and left lateral decubitus position. The small intestines were retracted to the cephalad and left lateral direction away from the pelvis and right lower quadrant. The patient was found to have an enlarged and inflamed appendix that was extending into the pelvis. There was no evidence of perforation.  The appendix was carefully dissected. A window was made in the mesoappendix at the base of the appendix. A harmonic scalpel was used across the mesoappendix. The appendix was divided at its base using an endo-GIA stapler. Minimal appendiceal stump was left in place. There was no evidence of bleeding, leakage, or complication after division of the appendix. Irrigation was also performed and irrigate suctioned from the abdomen as well.  Appendix removed with endo catch bag. No evidence of intraabdominal injury upon inspection.  The umbilical port site was closed using 0 vicryl pursestring sutures fashion at the level of the fascia. The trocar site skin wounds were closed using 4 O moncryl.  Instrument, sponge, and needle counts were correct at the conclusion of the case.   Findings: The appendix was found to be inflamed. There were signs of necrosis.  There was not perforation. There was not abscess formation.  Estimated Blood Loss:  Minimal         Drains: none         Total IV Fluids: 1000 mL         Specimens: appendix         Complications:  None; patient tolerated the procedure well.         Disposition: PACU - hemodynamically stable.         Condition: stable

## 2012-03-21 NOTE — ED Notes (Signed)
CT called for pt transport 

## 2012-03-21 NOTE — Transfer of Care (Signed)
Immediate Anesthesia Transfer of Care Note  Patient: Tina Fleming  Procedure(s) Performed: Procedure(s) (LRB) with comments: APPENDECTOMY LAPAROSCOPIC (N/A)  Patient Location: PACU  Anesthesia Type:General  Level of Consciousness: awake  Airway & Oxygen Therapy: Patient Spontanous Breathing  Post-op Assessment: Report given to PACU RN and Post -op Vital signs reviewed and stable  Post vital signs: Reviewed and stable  Complications: No apparent anesthesia complications

## 2012-03-21 NOTE — Progress Notes (Signed)
Day of Surgery  Subjective: A/A/O this am still c/o of nausea with food, "has not voided yet" per patient. Pain is well mananged.  Objective: Vital signs in last 24 hours: Temp:  [97.7 F (36.5 C)-99.2 F (37.3 C)] 98.3 F (36.8 C) (10/28 0532) Pulse Rate:  [77-149] 77  (10/28 0532) Resp:  [16-37] 16  (10/28 0532) BP: (111-135)/(55-86) 112/61 mmHg (10/28 0532) SpO2:  [93 %-100 %] 97 % (10/28 0532) Weight:  [229 lb 11.5 oz (104.2 kg)] 229 lb 11.5 oz (104.2 kg) (10/28 0532) Last BM Date: 03/20/12  Intake/Output from previous day: 10/27 0701 - 10/28 0700 In: 1300 [I.V.:1300] Out: 20 [Blood:20] Intake/Output this shift:    General appearance: alert, cooperative, appears stated age, no distress and moderately obese Chest: CTA bilaterally Cardiac: RRR No M/R/G Abdomen: soft, appropriately tender, all surgical wound sites are well approximated no signs of drainage or distention, + BS, flatus. + c/o nausea no emesis. Extremities: warm to touch + pulses bilaterally, moving all four extremities equally. WBC trending down post op, H&H down some from pre-op (will monitor, may represent dilution) VSS, afebrile  Lab Results:   [facility] 03/21/12 0821 03/20/12 2135  WBC 17.0* 18.8*  HGB 9.8* 11.8*  HCT 31.7* 38.0  PLT 327 399   BMET  Basename 03/20/12 2135  NA 137  K 3.7  CL 101  CO2 26  GLUCOSE 129*  BUN 9  CREATININE 0.78  CALCIUM 9.9   PT/INR No results found for this basename: LABPROT:2,INR:2 in the last 72 hours ABG No results found for this basename: PHART:2,PCO2:2,PO2:2,HCO3:2 in the last 72 hours  Studies/Results: Ct Abdomen Pelvis W Contrast  03/21/2012  *RADIOLOGY REPORT*  Clinical Data: No abdominal pain.  Difficulty urinating.  CT ABDOMEN AND PELVIS WITH CONTRAST  Technique:  Multidetector CT imaging of the abdomen and pelvis was performed following the standard protocol during bolus administration of intravenous contrast.  Contrast: 1 OMNIPAQUE IOHEXOL 300  MG/ML  SOLN, OMNIPAQUE IOHEXOL 300 MG/ML  SOLN  Comparison: None.  Findings: Dependent atelectasis is seen in the lung base.  No pleural or pericardial effusion.  The gallbladder, liver, spleen, adrenal glands, pancreas and left kidney appear normal.  Two punctate nonobstructing nonobstructing stones are identified in the right kidney.  The larger measures 0.4 cm.  There is a small amount of free pelvic fluid which is greater than typically seen in physiologic change.  The appendix is dilated at 1.4 cm and there is periappendiceal inflammatory change.  No abscess or perforation is identified.  Uterus and adnexa appear normal.  The stomach, small bowel and colon are normal in appearance.  There is no lymphadenopathy.  No focal bony abnormality is identified.  IMPRESSION:  1.  Study is positive for acute appendicitis without abscess or perforation. 2.  Two nonobstructing right renal stones are identified.  Critical Value/emergent results were called by telephone at the time of interpretation on 03/21/2012 at 1:55 am to Dr. Nicanor Alcon- Rasch, who verbally acknowledged these results.   Original Report Authenticated By: Bernadene Bell. Maricela Curet, M.D.     Anti-infectives: Anti-infectives     Start     Dose/Rate Route Frequency Ordered Stop   03/21/12 0600   cefOXitin (MEFOXIN) 2 g in dextrose 5 % 50 mL IVPB  Status:  Discontinued        2 g 100 mL/hr over 30 Minutes Intravenous On call to O.R. 03/21/12 0329 03/21/12 0530   03/21/12 0215  piperacillin-tazobactam (ZOSYN) IVPB 3.375 g  3.375 g 100 mL/hr over 30 Minutes Intravenous  Once 03/21/12 1308 03/21/12 0256          Assessment/Plan: There is no problem list on file for this patient. s/p Procedure(s) (LRB) with comments: APPENDECTOMY LAPAROSCOPIC (N/A)  Plan: 1. Will increase IVF and give NS bolus in order to hopefully increase UOP 2. May need in and out cath (will check residual if patient has not voided 4 hrs post Bolus and increased IV  rate) 3. Ambulate/OOB 4.Encourage IS use. 5. Will recheck labs in am   LOS: 1 day    Golda Acre [facility] Pager # [phone] 03/21/2012

## 2012-03-21 NOTE — Progress Notes (Signed)
Utilization review completed. Jalaiya Oyster, RN, BSN. 

## 2012-03-21 NOTE — Anesthesia Postprocedure Evaluation (Signed)
  Anesthesia Post-op Note  Patient: Tina Fleming  Procedure(s) Performed: Procedure(s) (LRB) with comments: APPENDECTOMY LAPAROSCOPIC (N/A)  Patient Location: PACU  Anesthesia Type: General  Level of Consciousness: awake, oriented, sedated and patient cooperative  Airway and Oxygen Therapy: Patient Spontanous Breathing  Post-op Pain: mild  Post-op Assessment: Post-op Vital signs reviewed, Patient's Cardiovascular Status Stable, Respiratory Function Stable, Patent Airway, No signs of Nausea or vomiting and Pain level controlled  Post-op Vital Signs: stable  Complications: No apparent anesthesia complications

## 2012-03-21 NOTE — Anesthesia Procedure Notes (Signed)
Procedure Name: Intubation Date/Time: 03/21/2012 3:36 AM Performed by: Alanda Amass A Pre-anesthesia Checklist: Patient identified, Timeout performed, Emergency Drugs available, Suction available and Patient being monitored Patient Re-evaluated:Patient Re-evaluated prior to inductionOxygen Delivery Method: Circle system utilized Preoxygenation: Pre-oxygenation with 100% oxygen Intubation Type: IV induction, Rapid sequence and Cricoid Pressure applied Laryngoscope Size: Mac and 3 Grade View: Grade I Tube type: Oral Tube size: 7.5 mm Number of attempts: 1 Airway Equipment and Method: Stylet Placement Confirmation: ETT inserted through vocal cords under direct vision,  breath sounds checked- equal and bilateral and positive ETCO2 Secured at: 21 cm Tube secured with: Tape Dental Injury: Teeth and Oropharynx as per pre-operative assessment

## 2012-03-21 NOTE — Progress Notes (Signed)
Agree with above.  Will need IVF and to urinate.  Ambulate today.  Home this PM or in AM.

## 2012-03-21 NOTE — H&P (Signed)
Tina Fleming is an 31 y.o. female.   Chief Complaint: abdominal pain HPI: 1 day history of periumbilical pain.  Sharp and worsening especially with movement.  Nausea and vomiting. CT shows appendicitis.  Past Medical History  Diagnosis Date  . Heart murmur     asymtomatic  . Anemia     iron suppliments in past-not currently  . Kidney stone     Past Surgical History  Procedure Date  . 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 Knee arthroscopy 04/24/2011    Procedure: ARTHROSCOPY KNEE;  Surgeon: Javier Docker;  Location: [facility];  Service: Orthopedics;  Laterality: Right;  /Arthroscopy with debridement, right knee    History reviewed. No pertinent family history. Social History:  reports that she has never smoked. She does not have any smokeless tobacco history on file. She reports that she does not drink alcohol or use illicit drugs.  Allergies:  Allergies  Allergen Reactions  . Sulfa Antibiotics Rash     (Not in a hospital admission)  Results for orders placed during the hospital encounter of 03/20/12 (from the past 48 hour(s))  CBC WITH DIFFERENTIAL     Status: Abnormal   Collection Time   03/20/12  9:35 PM      Component Value Range Comment   WBC 18.8 (*) 4.0 - 10.5 K/uL    RBC 5.21 (*) 3.87 - 5.11 MIL/uL    Hemoglobin 11.8 (*) 12.0 - 15.0 g/dL    HCT 47.8  29.5 - 62.1 %    MCV 72.9 (*) 78.0 - 100.0 fL    MCH 22.6 (*) 26.0 - 34.0 pg    MCHC 31.1  30.0 - 36.0 g/dL    RDW 30.8 (*) 65.7 - 15.5 %    Platelets 399  150 - 400 K/uL    Neutrophils Relative 90 (*) 43 - 77 %    Neutro Abs 16.9 (*) 1.7 - 7.7 K/uL    Lymphocytes Relative 6 (*) 12 - 46 %    Lymphs Abs 1.2  0.7 - 4.0 K/uL    Monocytes Relative 4  3 - 12 %    Monocytes Absolute 0.7  0.1 - 1.0 K/uL    Eosinophils Relative 0  0 - 5 %    Eosinophils Absolute 0.0  0.0 - 0.7 K/uL    Basophils Relative 0  0 - 1 %    Basophils Absolute 0.0  0.0 - 0.1 K/uL   COMPREHENSIVE METABOLIC  PANEL     Status: Abnormal   Collection Time   03/20/12  9:35 PM      Component Value Range Comment   Sodium 137  135 - 145 mEq/L    Potassium 3.7  3.5 - 5.1 mEq/L    Chloride 101  96 - 112 mEq/L    CO2 26  19 - 32 mEq/L    Glucose, Bld 129 (*) 70 - 99 mg/dL    BUN 9  6 - 23 mg/dL    Creatinine, Ser 8.46  0.50 - 1.10 mg/dL    Calcium 9.9  8.4 - 96.2 mg/dL    Total Protein 8.0  6.0 - 8.3 g/dL    Albumin 4.4  3.5 - 5.2 g/dL    AST 20  0 - 37 U/L    ALT 17  0 - 35 U/L    Alkaline Phosphatase 89  39 - 117 U/L    Total Bilirubin 0.2 (*) 0.3 -  1.2 mg/dL    GFR calc non Af Amer >90  >90 mL/min    GFR calc Af Amer >90  >90 mL/min   LIPASE, BLOOD     Status: Normal   Collection Time   03/20/12  9:35 PM      Component Value Range Comment   Lipase 27  11 - 59 U/L   URINALYSIS, MICROSCOPIC ONLY     Status: Abnormal   Collection Time   03/20/12 11:36 PM      Component Value Range Comment   Color, Urine YELLOW  YELLOW    APPearance CLOUDY (*) CLEAR    Specific Gravity, Urine 1.026  1.005 - 1.030    pH 7.5  5.0 - 8.0    Glucose, UA NEGATIVE  NEGATIVE mg/dL    Hgb urine dipstick NEGATIVE  NEGATIVE    Bilirubin Urine NEGATIVE  NEGATIVE    Ketones, ur 40 (*) NEGATIVE mg/dL    Protein, ur NEGATIVE  NEGATIVE mg/dL    Urobilinogen, UA 0.2  0.0 - 1.0 mg/dL    Nitrite NEGATIVE  NEGATIVE    Leukocytes, UA NEGATIVE  NEGATIVE    WBC, UA 0-2  <3 WBC/hpf    Bacteria, UA MANY (*) RARE    Squamous Epithelial / LPF FEW (*) RARE   POCT PREGNANCY, URINE     Status: Normal   Collection Time   03/20/12 11:40 PM      Component Value Range Comment   Preg Test, Ur NEGATIVE  NEGATIVE    Ct Abdomen Pelvis W Contrast  03/21/2012  *RADIOLOGY REPORT*  Clinical Data: No abdominal pain.  Difficulty urinating.  CT ABDOMEN AND PELVIS WITH CONTRAST  Technique:  Multidetector CT imaging of the abdomen and pelvis was performed following the standard protocol during bolus administration of intravenous contrast.   Contrast: 1 OMNIPAQUE IOHEXOL 300 MG/ML  SOLN, OMNIPAQUE IOHEXOL 300 MG/ML  SOLN  Comparison: None.  Findings: Dependent atelectasis is seen in the lung base.  No pleural or pericardial effusion.  The gallbladder, liver, spleen, adrenal glands, pancreas and left kidney appear normal.  Two punctate nonobstructing nonobstructing stones are identified in the right kidney.  The larger measures 0.4 cm.  There is a small amount of free pelvic fluid which is greater than typically seen in physiologic change.  The appendix is dilated at 1.4 cm and there is periappendiceal inflammatory change.  No abscess or perforation is identified.  Uterus and adnexa appear normal.  The stomach, small bowel and colon are normal in appearance.  There is no lymphadenopathy.  No focal bony abnormality is identified.  IMPRESSION:  1.  Study is positive for acute appendicitis without abscess or perforation. 2.  Two nonobstructing right renal stones are identified.  Critical Value/emergent results were called by telephone at the time of interpretation on 03/21/2012 at 1:55 am to Dr. Nicanor Alcon- Rasch, who verbally acknowledged these results.   Original Report Authenticated By: Bernadene Bell. Maricela Curet, M.D.     Review of Systems  Constitutional: Negative.   HENT: Negative.   Eyes: Negative.   Respiratory: Negative.   Cardiovascular: Negative.   Gastrointestinal: Positive for nausea, vomiting and abdominal pain.  Genitourinary: Negative.   Musculoskeletal: Negative.   Skin: Negative.   Neurological: Negative.   Endo/Heme/Allergies: Negative.   Psychiatric/Behavioral: Negative.     Blood pressure 127/76, pulse 79, temperature 97.7 F (36.5 C), temperature source Oral, resp. rate 18, last menstrual period 03/06/2012, SpO2 100.00%. Physical Exam  Constitutional: She is  oriented to person, place, and time. She appears well-developed and well-nourished.  HENT:  Head: Normocephalic and atraumatic.  Eyes: EOM are normal. Pupils  are equal, round, and reactive to light.  Neck: Normal range of motion. Neck supple.  Cardiovascular: Normal rate and regular rhythm.   Respiratory: Effort normal and breath sounds normal.  GI: There is tenderness in the right lower quadrant and periumbilical area. There is tenderness at McBurney's point.  Musculoskeletal: Normal range of motion.  Neurological: She is alert and oriented to person, place, and time.  Skin: Skin is warm.  Psychiatric: She has a normal mood and affect. Her behavior is normal. Judgment and thought content normal.     Assessment/Plan Acute appendicitis  Discussed medical and surgical management and risks/benifits of each.  She wishes to proceed with laparoscopic appendectomy.  Risks of bleeding,  Infection,  Organ injury open procedure ,  Abscess and need for further surgery.  She agrees to proceed.  Maxamus Colao A. 03/21/2012, 2:31 AM

## 2012-03-21 NOTE — Anesthesia Preprocedure Evaluation (Addendum)
Anesthesia Evaluation  Patient identified by MRN, date of birth, ID band Patient awake    Reviewed: Allergy & Precautions, H&P , NPO status , Patient's Chart, lab work & pertinent test results  History of Anesthesia Complications (+) PONV  Airway Mallampati: I TM Distance: >3 FB Neck ROM: Full    Dental  (+) Teeth Intact and Dental Advisory Given   Pulmonary          Cardiovascular + Valvular Problems/Murmurs Rhythm:regular Rate:Normal     Neuro/Psych  Headaches,    GI/Hepatic   Endo/Other    Renal/GU      Musculoskeletal   Abdominal   Peds  Hematology   Anesthesia Other Findings   Reproductive/Obstetrics                          Anesthesia Physical Anesthesia Plan  ASA: I  Anesthesia Plan: General   Post-op Pain Management:    Induction: Intravenous  Airway Management Planned: Oral ETT  Additional Equipment:   Intra-op Plan:   Post-operative Plan: Extubation in OR  Informed Consent: I have reviewed the patients History and Physical, chart, labs and discussed the procedure including the risks, benefits and alternatives for the proposed anesthesia with the patient or authorized representative who has indicated his/her understanding and acceptance.     Plan Discussed with: CRNA, Anesthesiologist and Surgeon  Anesthesia Plan Comments:         Anesthesia Quick Evaluation

## 2012-03-21 NOTE — ED Notes (Signed)
OR called for pt.  

## 2012-03-22 LAB — CBC
Hemoglobin: 9.3 g/dL — ABNORMAL LOW (ref 12.0–15.0)
MCH: 22 pg — ABNORMAL LOW (ref 26.0–34.0)
RBC: 4.22 MIL/uL (ref 3.87–5.11)

## 2012-03-22 MED ORDER — OXYCODONE-ACETAMINOPHEN 5-325 MG PO TABS: 1.0000 | ORAL_TABLET | ORAL | Status: DC | PRN

## 2012-03-22 NOTE — Progress Notes (Signed)
Pt doing better this PM and asking to go home.  Pt to f/u x 2 weeks

## 2012-03-22 NOTE — Progress Notes (Signed)
1 Day Post-Op  Subjective: 31 y/o female POD#2 s/p lap appy for appendicitis.  Pt's abdomen pain is much improved today, but she is c/o nausea, dizziness, and a headache.  She also did not receive her dinner last night and didn't receive her breakfast until 1000 this am so she is a bit frustrated.  As of 1045 she has been too nauseous to eat.  Pt was given zofran, but it hasn't kicked in yet.  Pt denies vomiting, diarrhea, chest pain, SOB, or leg tenderness.  Pt also notes nausea and dizziness during ambulation.  She is having flatus, but no BM yet.    Objective: Vital signs in last 24 hours: Temp:  [97.3 F (36.3 C)-98.2 F (36.8 C)] 97.3 F (36.3 C) (10/29 0643) Pulse Rate:  [55-66] 55  (10/29 0643) Resp:  [16-18] 18  (10/29 0643) BP: (110-112)/(60-61) 110/61 mmHg (10/29 0643) SpO2:  [94 %-98 %] 94 % (10/29 0643) Last BM Date: 03/20/12  Intake/Output from previous day: 10/28 0701 - 10/29 0700 In: 360 [P.O.:360] Out: 3 [Urine:2; Emesis/NG output:1] Intake/Output this shift:    PE: Gen:  Alert, NAD, pleasant Card:  RRR, no murmur heard Pulm:  CTA, no wheezing Abd: Soft, NT/ND, +BS, dermabond in place, no dressings, incisions C/D/I Ext:  No erythema, edema, or tenderness  Lab Results:   Basename 03/22/12 0740 03/21/12 0821  WBC 7.3 17.0*  HGB 9.3* 9.8*  HCT 31.4* 31.7*  PLT 309 327   BMET  Basename 03/21/12 0821 03/20/12 2135  NA 135 137  K 3.6 3.7  CL 103 101  CO2 24 26  GLUCOSE 110* 129*  BUN 6 9  CREATININE 0.68 0.78  CALCIUM 8.6 9.9   PT/INR No results found for this basename: LABPROT:2,INR:2 in the last 72 hours CMP     Component Value Date/Time   NA 135 03/21/2012 0821   K 3.6 03/21/2012 0821   CL 103 03/21/2012 0821   CO2 24 03/21/2012 0821   GLUCOSE 110* 03/21/2012 0821   BUN 6 03/21/2012 0821   CREATININE 0.68 03/21/2012 0821   CALCIUM 8.6 03/21/2012 0821   PROT 8.0 03/20/2012 2135   ALBUMIN 4.4 03/20/2012 2135   AST 20 03/20/2012 2135   ALT  17 03/20/2012 2135   ALKPHOS 89 03/20/2012 2135   BILITOT 0.2* 03/20/2012 2135   GFRNONAA >90 03/21/2012 0821   GFRAA >90 03/21/2012 0821   Lipase     Component Value Date/Time   LIPASE 27 03/20/2012 2135       Studies/Results: Ct Abdomen Pelvis W Contrast  03/21/2012  *RADIOLOGY REPORT*  Clinical Data: No abdominal pain.  Difficulty urinating.  CT ABDOMEN AND PELVIS WITH CONTRAST  Technique:  Multidetector CT imaging of the abdomen and pelvis was performed following the standard protocol during bolus administration of intravenous contrast.  Contrast: 1 OMNIPAQUE IOHEXOL 300 MG/ML  SOLN, OMNIPAQUE IOHEXOL 300 MG/ML  SOLN  Comparison: None.  Findings: Dependent atelectasis is seen in the lung base.  No pleural or pericardial effusion.  The gallbladder, liver, spleen, adrenal glands, pancreas and left kidney appear normal.  Two punctate nonobstructing nonobstructing stones are identified in the right kidney.  The larger measures 0.4 cm.  There is a small amount of free pelvic fluid which is greater than typically seen in physiologic change.  The appendix is dilated at 1.4 cm and there is periappendiceal inflammatory change.  No abscess or perforation is identified.  Uterus and adnexa appear normal.  The stomach, small  bowel and colon are normal in appearance.  There is no lymphadenopathy.  No focal bony abnormality is identified.  IMPRESSION:  1.  Study is positive for acute appendicitis without abscess or perforation. 2.  Two nonobstructing right renal stones are identified.  Critical Value/emergent results were called by telephone at the time of interpretation on 03/21/2012 at 1:55 am to Dr. Nicanor Alcon- Rasch, who verbally acknowledged these results.   Original Report Authenticated By: Bernadene Bell. Maricela Curet, M.D.     Anti-infectives: Anti-infectives     Start     Dose/Rate Route Frequency Ordered Stop   03/21/12 0600   cefOXitin (MEFOXIN) 2 g in dextrose 5 % 50 mL IVPB  Status:  Discontinued         2 g 100 mL/hr over 30 Minutes Intravenous On call to O.R. 03/21/12 0329 03/21/12 0530   03/21/12 0215  piperacillin-tazobactam (ZOSYN) IVPB 3.375 g       3.375 g 100 mL/hr over 30 Minutes Intravenous  Once 03/21/12 0212 03/21/12 0256           Assessment/Plan 31 y/o female POD#2 s/p lap appy for appendicitis 1.  Due to nausea, dizziness, and headache may not be able to go home today, will plan on tomorrow unless she is much improved this afternoon/evening 2.  Cont diet as tolerated 3.  Cont ambulation OOB 4.  Urinating today-no need for cath    LOS: 2 days    Tina Fleming, Tina Fleming 03/22/2012, 10:49 AM Pager: [phone]

## 2012-03-22 NOTE — Discharge Summary (Signed)
Physician Discharge Summary  Patient ID: Tina Fleming MRN: [id number] DOB/AGE: January 23, 1981 31 y.o.  Admit date: 03/20/2012 Discharge date: 03/22/2012  Admitting Diagnosis: 1.  Acute appendicitis  Discharge Diagnosis Acute appendicitis, s/p lap appy  Consultants None  Procedures Lap Appy  Hospital Course:  31 y/o female who presented to [facility] with right lower quadrant abdominal pain, nausea, and vomiting.  Workup showed acute appendicitis and elevated WBC count.  Patient was admitted and underwent procedure listed above.  Tolerated procedure well and was transferred to the floor.  Diet was advanced as tolerated.  On POD 1 and early AM on POD 2 she had some nausea and headache.  On POD 2 (PM), the patient was voiding well, tolerating diet, ambulating well, pain well controlled, vital signs stable, incisions c/d/i and felt stable for discharge home.  Patient will follow up in our office in 2-3 weeks and knows to call with questions or concerns.    Medication List     As of 03/22/2012 12:59 PM    TAKE these medications         multivitamin with minerals Tabs   Take 1 tablet by mouth daily.      oxyCODONE-acetaminophen 5-325 MG per tablet   Commonly known as: PERCOCET/ROXICET   Take 1-2 tablets by mouth every 4 (four) hours as needed.             Follow-up Information    Follow up with [facility]. On 04/12/2012. (2:00pm arrive at 1:30pm for appointment)    Contact information:   [address]   [place] [address] [phone]          Signed: Candiss Norse [facility] [phone]  03/22/2012, 12:59 PM

## 2012-03-22 NOTE — Progress Notes (Signed)
D/C instructions and scripts given.Pts parents are at the bedside to take pt home.

## 2012-03-24 ENCOUNTER — Encounter (HOSPITAL_COMMUNITY): Payer: Self-pay | Admitting: Surgery

## 2012-04-12 ENCOUNTER — Encounter (INDEPENDENT_AMBULATORY_CARE_PROVIDER_SITE_OTHER): Payer: Self-pay

## 2012-04-12 ENCOUNTER — Encounter (INDEPENDENT_AMBULATORY_CARE_PROVIDER_SITE_OTHER): Payer: Self-pay | Admitting: General Surgery

## 2012-04-12 ENCOUNTER — Ambulatory Visit (INDEPENDENT_AMBULATORY_CARE_PROVIDER_SITE_OTHER): Payer: Commercial Managed Care - PPO | Admitting: General Surgery

## 2012-04-12 VITALS — BP 122/80 | HR 85 | Temp 98.7°F | Ht 65.0 in | Wt 222.8 lb

## 2012-04-12 DIAGNOSIS — Z9889 Other specified postprocedural states: Secondary | ICD-10-CM

## 2012-04-12 DIAGNOSIS — K3580 Unspecified acute appendicitis: Secondary | ICD-10-CM

## 2012-04-12 NOTE — Progress Notes (Signed)
Tina Fleming [facility] [birth year] [id number] 04/12/2012   History of Present Illness: Tina Fleming is a  31 y.o. female who presents today status post lap appy by Dr. Luisa Hart.  Pathology reveals acute appendicitis.  The patient is tolerating a regular diet, having normal bowel movements, has good pain control.  She  is back to most normal activities.  The only complaint she has is a chronic cough since surgery.  No hoarseness.  Physical Exam: Abd: soft, nontender, active bowel sounds, nondistended.  All incisions are well healed.  Impression: 1.  Acute appendicitis, s/p lap appy  Plan: She is able to return to normal activities. She  may follow up on a prn basis. I have informed her to return to work in 2 more weeks when she can resume full duties.  She is also informed to follow up with her PCP for her cough.

## 2012-04-12 NOTE — Patient Instructions (Signed)
Return to work in 2 weeks

## 2012-07-09 ENCOUNTER — Other Ambulatory Visit: Payer: Self-pay

## 2013-02-07 ENCOUNTER — Encounter: Payer: Self-pay | Admitting: Internal Medicine

## 2013-02-07 ENCOUNTER — Ambulatory Visit (INDEPENDENT_AMBULATORY_CARE_PROVIDER_SITE_OTHER): Payer: 59 | Admitting: Internal Medicine

## 2013-02-07 VITALS — BP 110/80 | HR 66 | Temp 98.0°F | Ht 65.25 in | Wt 218.2 lb

## 2013-02-07 DIAGNOSIS — N200 Calculus of kidney: Secondary | ICD-10-CM

## 2013-02-07 DIAGNOSIS — E559 Vitamin D deficiency, unspecified: Secondary | ICD-10-CM

## 2013-02-07 DIAGNOSIS — R55 Syncope and collapse: Secondary | ICD-10-CM

## 2013-02-07 DIAGNOSIS — G43909 Migraine, unspecified, not intractable, without status migrainosus: Secondary | ICD-10-CM

## 2013-02-07 DIAGNOSIS — D649 Anemia, unspecified: Secondary | ICD-10-CM

## 2013-02-07 DIAGNOSIS — Z87440 Personal history of urinary (tract) infections: Secondary | ICD-10-CM

## 2013-02-07 DIAGNOSIS — K589 Irritable bowel syndrome without diarrhea: Secondary | ICD-10-CM

## 2013-02-07 DIAGNOSIS — Z13220 Encounter for screening for lipoid disorders: Secondary | ICD-10-CM

## 2013-02-07 LAB — LIPID PANEL
Cholesterol: 185 mg/dL (ref 0–200)
HDL: 45.8 mg/dL (ref >39.00)
VLDL: 16.2 mg/dL (ref 0.0–40.0)

## 2013-02-07 LAB — CBC WITH DIFFERENTIAL/PLATELET
Basophils Relative: 0.5 % (ref 0.0–3.0)
Eosinophils Relative: 1.1 % (ref 0.0–5.0)
HCT: 33.6 % — ABNORMAL LOW (ref 36.0–46.0)
Lymphs Abs: 1.4 10*3/uL (ref 0.7–4.0)
MCV: 70 fl — ABNORMAL LOW (ref 78.0–100.0)
Monocytes Absolute: 0.4 10*3/uL (ref 0.1–1.0)
RBC: 4.8 Mil/uL (ref 3.87–5.11)
WBC: 7.4 10*3/uL (ref 4.5–10.5)

## 2013-02-07 LAB — COMPREHENSIVE METABOLIC PANEL
Alkaline Phosphatase: 74 U/L (ref 39–117)
BUN: 12 mg/dL (ref 6–23)
Glucose, Bld: 87 mg/dL (ref 70–99)
Total Bilirubin: 0.4 mg/dL (ref 0.3–1.2)

## 2013-02-07 LAB — TSH: TSH: 1.12 u[IU]/mL (ref 0.35–5.50)

## 2013-02-08 ENCOUNTER — Telehealth: Payer: Self-pay | Admitting: Internal Medicine

## 2013-02-08 ENCOUNTER — Encounter: Payer: Self-pay | Admitting: Internal Medicine

## 2013-02-08 DIAGNOSIS — D649 Anemia, unspecified: Secondary | ICD-10-CM

## 2013-02-08 NOTE — Telephone Encounter (Signed)
Messaged pt back about the iron.

## 2013-02-08 NOTE — Telephone Encounter (Signed)
Pt notified of lab results via my chart.  Needs a follow up non fasting lab in 4 weeks.  Please schedule her for a lab appt date and time and call her.  Thanks.

## 2013-02-09 MED ORDER — POLYSACCHARIDE IRON COMPLEX 150 MG PO CAPS: 150.0000 mg | ORAL_CAPSULE | Freq: Every day | ORAL | Status: DC

## 2013-02-09 NOTE — Telephone Encounter (Signed)
Lab appointmetn 10/15  Pt aware jamie made appointment

## 2013-02-09 NOTE — Telephone Encounter (Signed)
Left message for pt to call office

## 2013-02-09 NOTE — Telephone Encounter (Signed)
Sent in rx for ferrex 150mg  q day #30 with 3 refills.

## 2013-02-11 ENCOUNTER — Encounter: Payer: Self-pay | Admitting: Internal Medicine

## 2013-02-11 DIAGNOSIS — K589 Irritable bowel syndrome without diarrhea: Secondary | ICD-10-CM | POA: Insufficient documentation

## 2013-02-11 DIAGNOSIS — N200 Calculus of kidney: Secondary | ICD-10-CM | POA: Insufficient documentation

## 2013-02-11 DIAGNOSIS — Z87440 Personal history of urinary (tract) infections: Secondary | ICD-10-CM | POA: Insufficient documentation

## 2013-02-11 DIAGNOSIS — G43909 Migraine, unspecified, not intractable, without status migrainosus: Secondary | ICD-10-CM | POA: Insufficient documentation

## 2013-02-11 DIAGNOSIS — D649 Anemia, unspecified: Secondary | ICD-10-CM | POA: Insufficient documentation

## 2013-02-11 DIAGNOSIS — E559 Vitamin D deficiency, unspecified: Secondary | ICD-10-CM | POA: Insufficient documentation

## 2013-02-11 DIAGNOSIS — R55 Syncope and collapse: Secondary | ICD-10-CM | POA: Insufficient documentation

## 2013-02-11 NOTE — Assessment & Plan Note (Signed)
Alternating diarrhea and constipation.  Saw Dr Wohl.  Has had colonoscopy.  Follow.    

## 2013-02-11 NOTE — Progress Notes (Signed)
  Subjective:    Patient ID: Tina Fleming, female    DOB: [birth year], 32 y.o.   MRN: [id number]  HPI 32 year old female with past history of nephrolithiasis, migraine headaches, anemia, vitamin D deficiency and IBS.  She comes in today to follow up on these issues as well as to establish care.  She has been seeing Dr Park Breed.  States she has a history of heart racing.  Previous syncope.  Last episode occurred a few years ago.  Had extensive w/up.  No reoccurrence.  Brother has ankylosing spondylitis.  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  No back pain.  She does have migraine headaches.  Diagnosed at age 25.  Has tried imitrex.  Also has tried Lexapro and zoloft.  She uses maxalt now.  Typical headache starts with her losing her vision out of one eye.  Has associated emesis.  Last severe headache 6/14.  Takes excedrin migraine three times per week to prevent headaches.  Better. Has IBS.  Has had colonoscopy at age 50/21.  Saw Dr Servando Snare.  Alternates between diarrhea and constipation.  Lives with her parents in [place].     Past Medical History  Diagnosis Date  . Heart murmur     asymtomatic  . Anemia     iron suppliments in past-not currently  . Nephrolithiasis   . Migraine   . IBS (irritable bowel syndrome)   . Anemia   . Vitamin D deficiency     Outpatient Encounter Prescriptions as of 02/07/2013  Medication Sig Dispense Refill  . rizatriptan (MAXALT) 10 MG tablet Take 10 mg by mouth as needed for migraine. May repeat in 2 hours if needed      . [DISCONTINUED] Multiple Vitamin (MULTIVITAMIN WITH MINERALS) TABS Take 1 tablet by mouth daily.       No facility-administered encounter medications on file as of 02/07/2013.    Review of Systems Patient denies lightheadedness or dizziness.  Headaches as outlined.  Takes excedrin migraine three times per week and since doing this - she has not had a severe headache.  Last severe headache - 6/14.  Has had intolerance to multiple medications.  No  chest pain, tightness or palpitations.  No increased shortness of breath, cough or congestion.  No nausea or vomiting. No acid reflux.  No abdominal pain or cramping.  No BRBPR or melana.  Alternating diarrhea and constipation.  No urine change.        Objective:   Physical Exam Filed Vitals:   02/07/13 0834  BP: 110/80  Pulse: 66  Temp: 98 F (53.59 C)   32 year old female in no acute distress.   HEENT:  Nares- clear.  Oropharynx - without lesions. NECK:  Supple.  Nontender.  No audible bruit.  HEART:  Appears to be regular. LUNGS:  No crackles or wheezing audible.  Respirations even and unlabored.  RADIAL PULSE:  Equal bilaterally.   ABDOMEN:  Soft, nontender.  Bowel sounds present and normal.  No audible abdominal bruit.   EXTREMITIES:  No increased edema present.  DP pulses palpable and equal bilaterally.          Assessment & Plan:  HEALTH MAINTENANCE.  Obtain records.  Keep on track with her physicals.    I spent 45 minutes with the patient and more than 50% of the time was spent in consultation regarding the above.

## 2013-02-11 NOTE — Assessment & Plan Note (Signed)
Last kidney stone - one year ago.  Stable.  Follow.

## 2013-02-11 NOTE — Assessment & Plan Note (Signed)
Has a history of urethral stricture.  Surgery at age 32.  Doing well.  Follow.

## 2013-02-11 NOTE — Assessment & Plan Note (Signed)
Has a history of anemia.  Check cbc and ferritin.   

## 2013-02-11 NOTE — Assessment & Plan Note (Signed)
Extensive w/up.  Not an issue now.  Follow.   

## 2013-02-11 NOTE — Assessment & Plan Note (Signed)
Headaches as outlined.  Takes excedrin migraine now three times per week - preventative.  She has had intolerance to multiple medications.  Discussed concern regarding rebound headaches with increased excedrin migraine.  Discussed referral to neurology for further evaluation and treatment of her headaches.  She is in agreement.  Schedule appt with [facility].

## 2013-02-11 NOTE — Assessment & Plan Note (Signed)
Check vitamin D level 

## 2013-02-21 ENCOUNTER — Ambulatory Visit (INDEPENDENT_AMBULATORY_CARE_PROVIDER_SITE_OTHER): Payer: 59 | Admitting: Neurology

## 2013-02-21 ENCOUNTER — Encounter: Payer: Self-pay | Admitting: Neurology

## 2013-02-21 VITALS — BP 114/71 | HR 72 | Ht 65.0 in | Wt 220.0 lb

## 2013-02-21 DIAGNOSIS — G43909 Migraine, unspecified, not intractable, without status migrainosus: Secondary | ICD-10-CM

## 2013-02-21 NOTE — Progress Notes (Signed)
Reason for visit: Headache  Tina Fleming is a 32 y.o. female  History of present illness:  Tina Fleming is a 32 year old right-handed white female with a history of migraine headaches since age 10. The patient indicates that she gets 1 or 2 headaches a month, essentially always while at work. The patient believes that stress will bring on her headaches. The patient is taking Excedrin Migraine approximately 3 times a week when she goes to work to help prevent the headache. The patient will take Maxalt for the headache, but she indicates that this causes dizziness, and she will not take this until she leaves work, several hours after the headache has started. The patient will note that the headaches will begin with visual changes involving one eye or the next, and then go into a generalized throbbing headache. The patient does have nausea and vomiting with the headache, and she has not been able to take Imitrex because of nausea. The patient indicates that light will bother her during the headache, but sound does not. The patient reports no focal numbness or weakness of the face, arms, or legs. The patient does not note any other particular activating factors for her headache. The patient is sent to this office for an evaluation.  Past Medical History  Diagnosis Date  . Heart murmur     asymtomatic  . Anemia     iron suppliments in past-not currently  . Nephrolithiasis   . Migraine   . IBS (irritable bowel syndrome)   . Anemia   . Vitamin D deficiency   . Obesity     Past Surgical History  Procedure Laterality Date  . Urethral stricture dilatation      as a child  . Knee arthroscopy  04/24/2011    Procedure: ARTHROSCOPY KNEE;  Surgeon: Javier Docker;  Location: [facility];  Service: Orthopedics;  Laterality: Right;  /Arthroscopy with debridement, right knee  . Laparoscopic appendectomy  03/21/2012    Procedure: APPENDECTOMY LAPAROSCOPIC;  Surgeon: Clovis Pu.  Cornett, MD;  Location: [facility];  Service: General;  Laterality: N/A;  . Appendectomy  03/21/12    laproscopic appy    Family History  Problem Relation Age of Onset  . Cancer Mother     pancreatic  . Arthritis Mother   . Hyperlipidemia Mother   . Hypertension Mother   . Cancer Father     skin  . Arthritis Father   . Hyperlipidemia Father   . Hypertension Father   . Diabetes Father   . Ankylosing spondylitis Brother   . Spondylitis Brother     Social history:  reports that she has never smoked. She has never used smokeless tobacco. She reports that she does not drink alcohol or use illicit drugs.  Medications:  Current Outpatient Prescriptions on File Prior to Visit  Medication Sig Dispense Refill  . iron polysaccharides (FERREX 150) 150 MG capsule Take 1 capsule (150 mg total) by mouth daily.  30 capsule  3  . rizatriptan (MAXALT) 10 MG tablet Take 10 mg by mouth as needed for migraine. May repeat in 2 hours if needed       No current facility-administered medications on file prior to visit.      Allergies  Allergen Reactions  . Sulfa Antibiotics Rash    ROS:  Out of a complete 14 system review of symptoms, the patient complains only of the following symptoms, and all other reviewed systems are negative.  Murmur Anemia Allergies  Headache  Blood pressure 114/71, pulse 72, height 5\' 5"  (1.651 m), weight 220 lb (99.791 kg), last menstrual period 02/02/2013.  Physical Exam  General: The patient is alert and cooperative at the time of the examination. The patient is moderately to markedly obese.  Head: Pupils are equal, round, and reactive to light. Discs are flat bilaterally.  Neck: The neck is supple, no carotid bruits are noted.  Respiratory: The respiratory examination is clear.  Cardiovascular: The cardiovascular examination reveals a regular rate and rhythm, no obvious murmurs or rubs are noted.  Neuromuscular: The patient has good range of movement of the  cervical spine, some crepitus is noted in the temporomandibular joints bilaterally.  Skin: Extremities are without significant edema.  Neurologic Exam  Mental status:  Cranial nerves: Facial symmetry is present. There is good sensation of the face to pinprick and soft touch bilaterally. The strength of the facial muscles and the muscles to head turning and shoulder shrug are normal bilaterally. Speech is well enunciated, no aphasia or dysarthria is noted. Extraocular movements are full. Visual fields are full.  Motor: The motor testing reveals 5 over 5 strength of all 4 extremities. Good symmetric motor tone is noted throughout.  Sensory: Sensory testing is intact to pinprick, soft touch, vibration sensation, and position sense on all 4 extremities. No evidence of extinction is noted.  Coordination: Cerebellar testing reveals good finger-nose-finger and heel-to-shin bilaterally.  Gait and station: Gait is normal. Tandem gait is normal. Romberg is negative. No drift is seen.  Reflexes: Deep tendon reflexes are symmetric and normal bilaterally. Toes are downgoing bilaterally.   Assessment/Plan:  1. Migraine headache  The patient is having only one or 2 headaches a month, and treatment of these headaches usually do not include a daily prophylactic medication. We will need to try to find a medication that she can tolerate, and one that works for her headaches. The patient is having dizziness from Maxalt, and she cannot keep the Imitrex down because of nausea. I will try Zomig nasal spray to see if this helps. Samples were given. The patient will contact our office if she needs a prescription. The patient otherwise will followup in 3 or 4 months.  Marlan Palau MD 02/21/2013 1:34 PM  [facility] [address] [place], [place] [address]  Phone [phone] Fax [phone]

## 2013-03-08 ENCOUNTER — Other Ambulatory Visit: Payer: Self-pay

## 2013-03-09 ENCOUNTER — Other Ambulatory Visit (INDEPENDENT_AMBULATORY_CARE_PROVIDER_SITE_OTHER): Payer: 59

## 2013-03-09 DIAGNOSIS — D649 Anemia, unspecified: Secondary | ICD-10-CM

## 2013-03-09 LAB — CBC WITH DIFFERENTIAL/PLATELET
Basophils Absolute: 0 10*3/uL (ref 0.0–0.1)
Eosinophils Absolute: 0.1 10*3/uL (ref 0.0–0.7)
Hemoglobin: 10.4 g/dL — ABNORMAL LOW (ref 12.0–15.0)
Lymphocytes Relative: 24 % (ref 12.0–46.0)
MCHC: 31.6 g/dL (ref 30.0–36.0)
Neutro Abs: 4.6 10*3/uL (ref 1.4–7.7)
Neutrophils Relative %: 69.4 % (ref 43.0–77.0)
RDW: 19.4 % — ABNORMAL HIGH (ref 11.5–14.6)

## 2013-03-10 ENCOUNTER — Telehealth: Payer: Self-pay | Admitting: Internal Medicine

## 2013-03-10 ENCOUNTER — Encounter: Payer: Self-pay | Admitting: Internal Medicine

## 2013-03-10 DIAGNOSIS — D649 Anemia, unspecified: Secondary | ICD-10-CM

## 2013-03-10 MED ORDER — POLYSACCHARIDE IRON COMPLEX 150 MG PO CAPS: 150.0000 mg | ORAL_CAPSULE | Freq: Two times a day (BID) | ORAL | Status: DC

## 2013-03-10 NOTE — Telephone Encounter (Signed)
rx sent if for femerex bid #60 with 3 refills

## 2013-03-10 NOTE — Telephone Encounter (Signed)
Pt notified of lab results and need for f/u lab in 6 weeks.   Please schedule her for a non fasting lab appt in 6 weeks and contact pt with appt date and time.  Thanks.

## 2013-03-13 NOTE — Telephone Encounter (Signed)
Left message for pt to call office

## 2013-03-15 ENCOUNTER — Encounter: Payer: Self-pay | Admitting: Internal Medicine

## 2013-03-15 NOTE — Telephone Encounter (Signed)
Left message for pt to call office

## 2013-03-17 NOTE — Telephone Encounter (Signed)
Appointment 1/25 pt aware °

## 2013-03-30 ENCOUNTER — Other Ambulatory Visit: Payer: Self-pay

## 2013-04-14 ENCOUNTER — Emergency Department (HOSPITAL_COMMUNITY): Payer: 59

## 2013-04-14 ENCOUNTER — Encounter (HOSPITAL_COMMUNITY): Payer: Self-pay | Admitting: Emergency Medicine

## 2013-04-14 ENCOUNTER — Emergency Department (HOSPITAL_COMMUNITY)
Admission: EM | Admit: 2013-04-14 | Discharge: 2013-04-14 | Disposition: A | Discharge status: Home / Self Care | Payer: 59 | Attending: Emergency Medicine | Admitting: Emergency Medicine

## 2013-04-14 DIAGNOSIS — G43909 Migraine, unspecified, not intractable, without status migrainosus: Secondary | ICD-10-CM | POA: Insufficient documentation

## 2013-04-14 DIAGNOSIS — Z79899 Other long term (current) drug therapy: Secondary | ICD-10-CM | POA: Insufficient documentation

## 2013-04-14 DIAGNOSIS — D649 Anemia, unspecified: Secondary | ICD-10-CM | POA: Insufficient documentation

## 2013-04-14 DIAGNOSIS — N200 Calculus of kidney: Secondary | ICD-10-CM | POA: Insufficient documentation

## 2013-04-14 DIAGNOSIS — Z3202 Encounter for pregnancy test, result negative: Secondary | ICD-10-CM | POA: Insufficient documentation

## 2013-04-14 DIAGNOSIS — E669 Obesity, unspecified: Secondary | ICD-10-CM | POA: Insufficient documentation

## 2013-04-14 DIAGNOSIS — R011 Cardiac murmur, unspecified: Secondary | ICD-10-CM | POA: Insufficient documentation

## 2013-04-14 DIAGNOSIS — Z8719 Personal history of other diseases of the digestive system: Secondary | ICD-10-CM | POA: Insufficient documentation

## 2013-04-14 DIAGNOSIS — Z8639 Personal history of other endocrine, nutritional and metabolic disease: Secondary | ICD-10-CM | POA: Insufficient documentation

## 2013-04-14 LAB — COMPREHENSIVE METABOLIC PANEL
ALT: 16 U/L (ref 0–35)
AST: 20 U/L (ref 0–37)
CO2: 23 mEq/L (ref 19–32)
Chloride: 104 mEq/L (ref 96–112)
Creatinine, Ser: 1.23 mg/dL — ABNORMAL HIGH (ref 0.50–1.10)
GFR calc Af Amer: 67 mL/min — ABNORMAL LOW (ref >90)
GFR calc non Af Amer: 58 mL/min — ABNORMAL LOW (ref >90)
Sodium: 136 mEq/L (ref 135–145)
Total Bilirubin: 0.4 mg/dL (ref 0.3–1.2)

## 2013-04-14 LAB — URINALYSIS, ROUTINE W REFLEX MICROSCOPIC
Bilirubin Urine: NEGATIVE
Glucose, UA: NEGATIVE mg/dL
Nitrite: NEGATIVE
Protein, ur: NEGATIVE mg/dL
Specific Gravity, Urine: 1.031 — ABNORMAL HIGH (ref 1.005–1.030)
Urobilinogen, UA: 0.2 mg/dL (ref 0.0–1.0)

## 2013-04-14 LAB — CBC WITH DIFFERENTIAL/PLATELET
Eosinophils Relative: 1 % (ref 0–5)
Hemoglobin: 10.2 g/dL — ABNORMAL LOW (ref 12.0–15.0)
Lymphs Abs: 1.4 10*3/uL (ref 0.7–4.0)
MCV: 71.7 fL — ABNORMAL LOW (ref 78.0–100.0)
Monocytes Absolute: 0.9 10*3/uL (ref 0.1–1.0)
Monocytes Relative: 9 % (ref 3–12)
Neutro Abs: 7.5 10*3/uL (ref 1.7–7.7)
Neutrophils Relative %: 76 % (ref 43–77)
Platelets: 330 10*3/uL (ref 150–400)
RBC: 4.6 MIL/uL (ref 3.87–5.11)
WBC: 9.9 10*3/uL (ref 4.0–10.5)

## 2013-04-14 LAB — URINE MICROSCOPIC-ADD ON

## 2013-04-14 MED ORDER — KETOROLAC TROMETHAMINE 30 MG/ML IJ SOLN
30.0000 mg | Freq: Once | INTRAMUSCULAR | Status: AC
  Administered 2013-04-14: 30 mg via INTRAVENOUS
  Filled 2013-04-14: qty 1

## 2013-04-14 MED ORDER — ONDANSETRON HCL 4 MG/2ML IJ SOLN
4.0000 mg | Freq: Once | INTRAMUSCULAR | Status: AC
  Administered 2013-04-14: 4 mg via INTRAVENOUS

## 2013-04-14 MED ORDER — ONDANSETRON HCL 4 MG/2ML IJ SOLN
4.0000 mg | Freq: Once | INTRAMUSCULAR | Status: AC
  Administered 2013-04-14: 4 mg via INTRAVENOUS
  Filled 2013-04-14: qty 2

## 2013-04-14 MED ORDER — HYDROMORPHONE HCL PF 1 MG/ML IJ SOLN
1.0000 mg | Freq: Once | INTRAMUSCULAR | Status: AC
  Administered 2013-04-14: 1 mg via INTRAVENOUS
  Filled 2013-04-14: qty 1

## 2013-04-14 MED ORDER — ONDANSETRON 4 MG PO TBDP: 4.0000 mg | ORAL_TABLET | Freq: Three times a day (TID) | ORAL | Status: DC | PRN

## 2013-04-14 MED ORDER — HYDROCODONE-ACETAMINOPHEN 5-325 MG PO TABS: 1.0000 | ORAL_TABLET | Freq: Four times a day (QID) | ORAL | Status: DC | PRN

## 2013-04-14 MED ORDER — SODIUM CHLORIDE 0.9 % IV SOLN
1000.0000 mL | Freq: Once | INTRAVENOUS | Status: AC
  Administered 2013-04-14: 1000 mL via INTRAVENOUS

## 2013-04-14 MED ORDER — ONDANSETRON HCL 4 MG/2ML IJ SOLN
4.0000 mg | Freq: Once | INTRAMUSCULAR | Status: DC
  Filled 2013-04-14: qty 2

## 2013-04-14 MED ORDER — HYDROMORPHONE HCL PF 1 MG/ML IJ SOLN
0.5000 mg | Freq: Once | INTRAMUSCULAR | Status: AC
  Administered 2013-04-14: 0.5 mg via INTRAVENOUS
  Filled 2013-04-14: qty 1

## 2013-04-14 NOTE — ED Notes (Signed)
Patient reports to ED with right, mid abdominal pain radiating to right back, and nausea. Denies vomiting, diarrhea.

## 2013-04-14 NOTE — ED Notes (Signed)
Patient was educated not to drive, operate heavy machinery, or drink alcohol while taking narcotic medication.  

## 2013-04-14 NOTE — ED Provider Notes (Signed)
CSN: [id number]     Arrival date & time 04/14/13  7829 History   First MD Initiated Contact with Patient 04/14/13 0734     No chief complaint on file.  (Consider location/radiation/quality/duration/timing/severity/associated sxs/prior Treatment) HPI The patient presents with concerns of right abdominal right flank pain. Symptoms began several days ago without clear precipitant.  Since onset symptoms have been waxing/waning, severe when most prominent.  The pain is sharp, otherwise nonradiating.  There is associated nausea, but no vomiting. No urinary changes.  No fever, no chills, no confusion, no disorientation. Patient notes a history of kidney stone.  States that this feels similar. She has a history of appendectomy, prior ureteral stenosis revision. Past Medical History  Diagnosis Date  . Heart murmur     asymtomatic  . Anemia     iron suppliments in past-not currently  . Nephrolithiasis   . Migraine   . IBS (irritable bowel syndrome)   . Anemia   . Vitamin D deficiency   . Obesity    Past Surgical History  Procedure Laterality Date  . Urethral stricture dilatation      as a child  . Knee arthroscopy  04/24/2011    Procedure: ARTHROSCOPY KNEE;  Surgeon: Javier Docker;  Location: [facility];  Service: Orthopedics;  Laterality: Right;  /Arthroscopy with debridement, right knee  . Laparoscopic appendectomy  03/21/2012    Procedure: APPENDECTOMY LAPAROSCOPIC;  Surgeon: Clovis Pu. Cornett, MD;  Location: [facility];  Service: General;  Laterality: N/A;  . Appendectomy  03/21/12    laproscopic appy   Family History  Problem Relation Age of Onset  . Cancer Mother     pancreatic  . Arthritis Mother   . Hyperlipidemia Mother   . Hypertension Mother   . Cancer Father     skin  . Arthritis Father   . Hyperlipidemia Father   . Hypertension Father   . Diabetes Father   . Ankylosing spondylitis Brother   . Spondylitis Brother    History  Substance Use Topics   . Smoking status: Never Smoker   . Smokeless tobacco: Never Used  . Alcohol Use: No   OB History   Grav Para Term Preterm Abortions TAB SAB Ect Mult Living                 Review of Systems  Constitutional:       Per HPI, otherwise negative  HENT:       Per HPI, otherwise negative  Respiratory:       Per HPI, otherwise negative  Cardiovascular:       Per HPI, otherwise negative  Gastrointestinal: Negative for vomiting.  Endocrine:       Negative aside from HPI  Genitourinary:       Neg aside from HPI   Musculoskeletal:       Per HPI, otherwise negative  Skin: Negative.   Neurological: Negative for syncope.    Allergies  Sulfa antibiotics  Home Medications   Current Outpatient Rx  Name  Route  Sig  Dispense  Refill  . iron polysaccharides (FERREX 150) 150 MG capsule   Oral   Take 1 capsule (150 mg total) by mouth 2 (two) times daily.   60 capsule   3   . rizatriptan (MAXALT) 10 MG tablet   Oral   Take 10 mg by mouth as needed for migraine. May repeat in 2 hours if needed  BP 145/90  Pulse 81  Temp(Src) 97.8 F (36.6 C) (Oral)  Resp 16  SpO2 97% Physical Exam  Nursing note and vitals reviewed. Constitutional: She is oriented to person, place, and time. She appears well-developed and well-nourished. No distress.  HENT:  Head: Normocephalic and atraumatic.  Eyes: Conjunctivae and EOM are normal.  Cardiovascular: Normal rate and regular rhythm.   Pulmonary/Chest: Effort normal and breath sounds normal. No stridor. No respiratory distress.  Abdominal: She exhibits no distension. There is no tenderness. There is no rebound.  Musculoskeletal: She exhibits no edema.  Neurological: She is alert and oriented to person, place, and time. No cranial nerve deficit.  Skin: Skin is warm and dry.  Psychiatric: She has a normal mood and affect.    ED Course  Procedures (including critical care time) Labs Review Labs Reviewed  CBC WITH DIFFERENTIAL   COMPREHENSIVE METABOLIC PANEL  URINALYSIS, ROUTINE W REFLEX MICROSCOPIC  PREGNANCY, URINE   Imaging Review No results found.  EKG Interpretation   None      Update: On repeat exam the patient appears better.  She continues to have mild pain.  Ultrasound results discussed.  With persistent pain, elevated creatinine, CT scan ordered.  Update: Patient feeling.  CT results discussed with her and her mother.  She will follow up with nephrology. MDM   1. Nephrolithiasis    This patient presents with flank pain, and on exam is awake and alert, afebrile, uncomfortable, but in no distress.  Patient had substantial improvement in her condition here, though with evidence of a kidney stone, mild elevation in creatinine, she requires outpatient followup with urology.  She is a Engineer, civil (consulting).  She voiced an understanding of return precautions, follow up instructions.    Gerhard Munch, MD 04/14/13 1100

## 2013-04-14 NOTE — ED Notes (Signed)
Patient transported to CT 

## 2013-04-15 LAB — URINE CULTURE

## 2013-04-17 ENCOUNTER — Encounter (HOSPITAL_COMMUNITY): Payer: Self-pay | Admitting: *Deleted

## 2013-04-17 ENCOUNTER — Encounter (HOSPITAL_COMMUNITY)
Admission: RE | Disposition: A | Discharge status: Home / Self Care | Payer: Self-pay | Source: Ambulatory Visit | Attending: Urology

## 2013-04-17 ENCOUNTER — Ambulatory Visit (HOSPITAL_COMMUNITY)
Admission: RE | Admit: 2013-04-17 | Discharge: 2013-04-17 | Disposition: A | Discharge status: Home / Self Care | Payer: 59 | Source: Ambulatory Visit | Attending: Urology | Admitting: Urology

## 2013-04-17 ENCOUNTER — Other Ambulatory Visit: Payer: Self-pay | Admitting: Urology

## 2013-04-17 DIAGNOSIS — Z87442 Personal history of urinary calculi: Secondary | ICD-10-CM | POA: Insufficient documentation

## 2013-04-17 DIAGNOSIS — E669 Obesity, unspecified: Secondary | ICD-10-CM | POA: Insufficient documentation

## 2013-04-17 DIAGNOSIS — N201 Calculus of ureter: Secondary | ICD-10-CM | POA: Insufficient documentation

## 2013-04-17 SURGERY — LITHOTRIPSY, ESWL
Anesthesia: LOCAL | Laterality: Right

## 2013-04-17 MED ORDER — DEXTROSE-NACL 5-0.45 % IV SOLN
INTRAVENOUS | Status: DC
  Administered 2013-04-17: 16:00:00 via INTRAVENOUS

## 2013-04-17 MED ORDER — CIPROFLOXACIN HCL 500 MG PO TABS
500.0000 mg | ORAL_TABLET | ORAL | Status: AC
  Administered 2013-04-17: 500 mg via ORAL
  Filled 2013-04-17: qty 1

## 2013-04-17 MED ORDER — OXYCODONE-ACETAMINOPHEN 5-325 MG PO TABS: 1.0000 | ORAL_TABLET | ORAL | Status: DC | PRN

## 2013-04-17 MED ORDER — DIAZEPAM 5 MG PO TABS
10.0000 mg | ORAL_TABLET | ORAL | Status: AC
  Administered 2013-04-17: 10 mg via ORAL
  Filled 2013-04-17: qty 2

## 2013-04-17 MED ORDER — DIPHENHYDRAMINE HCL 25 MG PO CAPS
25.0000 mg | ORAL_CAPSULE | ORAL | Status: AC
  Administered 2013-04-17: 25 mg via ORAL
  Filled 2013-04-17: qty 1

## 2013-04-17 NOTE — H&P (Signed)
ctive Problems Problems   1. Ureteral stone (592.1)  History of Present Illness  Tina Fleming is a former patient with a history of Calcium Oxalate stones who I last saw in 2007.   She had the onset of severe right flank pain on Weds and finally had to go to the ER on Friday where a CT showed an obstructing 5mm stone in the right proximal ureter.   There was a tiny renal stone as well.  She still has severe pain and nausea.   Vicodin helps but doesn't eliminate the pain and doesn't last.  She has some dysuria and straining to void which started Thursday.  She has had no fever.    She has had one additional stone that she passed about 18 months ago.  She passed her stone in 2007.   Past Medical History Problems   1. History of cardiac murmur (V12.59)  2. History of renal calculi (V13.01)  Surgical History Problems   1. History of Appendectomy  2. History of Knee Arthroscopy  3. History of Urethra Surgery  Current Meds  1. Ferrex 150 Forte CAPS;  Therapy: (Recorded:24Nov2014) to Recorded  2. Hydrocodone-Acetaminophen 5-325 MG Oral Tablet;  Therapy: (Recorded:24Nov2014) to Recorded  Allergies Medication   1. Sulfa Drugs  Family History Problems   1. Family history of Blood in the urine : Father  2. Family history of hypertension (V17.49) : Mother  3. Family history of kidney stones (V18.69) : Father  4. Family history of pancreatic cancer (V16.0) : Mother  5. Family history of skin cancer (V16.8) : Father  Social History Problems    Daily caffeine consumption, 1 serving a day   Father alive   Mother alive   Never smoker (V49.89)   No alcohol use   Occupation   Engineer, civil (consulting) at [employer].   Single  Review of Systems Genitourinary, constitutional, skin, eye, otolaryngeal, hematologic/lymphatic, cardiovascular, pulmonary, endocrine, musculoskeletal, gastrointestinal, neurological and psychiatric system(s) were reviewed and pertinent findings if present are noted.   Genitourinary: dysuria, nocturia, urinary stream starts and stops, hematuria (micro in ER. ) and initiating urination requires straining.  Gastrointestinal: nausea, vomiting, heartburn and constipation.  Constitutional: feeling tired (fatigue).  Musculoskeletal: back pain.    Vitals Vital Signs [Data Includes: Last 1 Day]  Recorded: 24Nov2014 10:07AM  Height: 5 ft 5 in Weight: 220 lb  BMI Calculated: 36.61 BSA Calculated: 2.06 Blood Pressure: 126 / 76 Temperature: 97.5 F Heart Rate: 57  Physical Exam Constitutional: Well nourished and well developed . No acute distress.  ENT:. The ears and nose are normal in appearance.  Neck: The appearance of the neck is normal and no neck mass is present.  Pulmonary: No respiratory distress and normal respiratory rhythm and effort.  Cardiovascular: Heart rate and rhythm are normal . No peripheral edema.  Abdomen: The abdomen is mildly obese. No masses are palpated. Moderate tenderness in the RLQ is present. moderate right CVA tenderness and no left CVA tenderness. No hernias are palpable. No hepatosplenomegaly noted.  Skin: Normal skin turgor, no visible rash and no visible skin lesions.  Neuro/Psych:. Mood and affect are appropriate.    Results/Data Urine [Data Includes: Last 1 Day]   24Nov2014  COLOR YELLOW   APPEARANCE CLEAR   SPECIFIC GRAVITY 1.010   pH 7.0   GLUCOSE NEG mg/dL  BILIRUBIN NEG   KETONE NEG mg/dL  BLOOD NEG   PROTEIN NEG mg/dL  UROBILINOGEN 0.2 mg/dL  NITRITE NEG   LEUKOCYTE ESTERASE NEG  The following images/tracing/specimen were independently visualized:  I have reviewed her CT films and report. KUB today shows a 5mm stone between L3 and L4 on the right which is minimally changed from the CT. There are no other abnormalities.  The following clinical lab reports were reviewed:  UA and recent blood work reviewed.    Assessment Assessed   1. Ureteral stone (592.1)   She has a 5mm symptomatic right proximal  stone.   Plan Health Maintenance   1. UA With REFLEX; Status:Resulted - Requires Verification;   Done: 24Nov2014 09:48AM Ureteral stone   2. Administer: Morphine Sulfate 15 MG/ML Injection Solution; INJECT 10  MG Intramuscular;  To Be Done: 24Nov2014  3. Administer: Promethazine HCl 25 MG/ML Injection Solution; INJECT 0.5  ML  Intramuscular 12.5mg  IM; To Be Done: 24Nov2014  4. KUB; Status:Resulted - Requires Verification;   Done: 24Nov2014 12:00AM   I reviewed the options including MET, ureteroscopy and ESWL and will set her up for right ESWL. I reviewed the risks of bleeding, infection, injury to adjacent organs, need for secondary procedures for obstructing fragments, thrombotic events and sedation complications.    We will do her today.   Discussion/Summary  CC: Dr. Dale [place]

## 2013-04-18 HISTORY — PX: EXTRACORPOREAL SHOCK WAVE LITHOTRIPSY: SHX1557

## 2013-04-23 ENCOUNTER — Encounter: Payer: Self-pay | Admitting: Internal Medicine

## 2013-04-28 ENCOUNTER — Other Ambulatory Visit: Payer: Self-pay

## 2013-05-03 ENCOUNTER — Ambulatory Visit (INDEPENDENT_AMBULATORY_CARE_PROVIDER_SITE_OTHER): Payer: 59 | Admitting: Internal Medicine

## 2013-05-03 ENCOUNTER — Encounter: Payer: Self-pay | Admitting: Internal Medicine

## 2013-05-03 VITALS — BP 132/72 | HR 70 | Temp 98.3°F | Resp 12 | Ht 65.0 in | Wt 224.0 lb

## 2013-05-03 DIAGNOSIS — J209 Acute bronchitis, unspecified: Secondary | ICD-10-CM

## 2013-05-03 MED ORDER — BENZONATATE 200 MG PO CAPS: 200.0000 mg | ORAL_CAPSULE | Freq: Three times a day (TID) | ORAL | Status: DC | PRN

## 2013-05-03 NOTE — Progress Notes (Signed)
Pre visit review using our clinic review tool, if applicable. No additional management support is needed unless otherwise documented below in the visit note. 

## 2013-05-03 NOTE — Progress Notes (Signed)
Patient ID: Tina Fleming, female   DOB: Jun 14, 1980, 32 y.o.   MRN: [id number]  Patient Active Problem List   Diagnosis Date Noted  . Acute bronchitis 05/06/2013  . Nephrolithiasis 02/11/2013  . Migraine 02/11/2013  . History of frequent urinary tract infections 02/11/2013  . Syncope 02/11/2013  . IBS (irritable bowel syndrome) 02/11/2013  . Anemia 02/11/2013  . Vitamin D deficiency 02/11/2013    Subjective:  CC:   Chief Complaint  Patient presents with  . Cough    productive cough in the mornings, dry cough throughout day x 1 week    HPI:   Tina Dreibelbis Rimmeris a 32 y.o. female who presents with Productive cough for the past week ,  Worse in the morning  Dries up but persistent hacking for the rest of the day  No fevers, myalgias, or sinus congestion/drainage.    Past Medical History  Diagnosis Date  . Heart murmur     asymtomatic  . Anemia     iron suppliments in past-not currently  . Nephrolithiasis   . Migraine   . IBS (irritable bowel syndrome)   . Anemia   . Vitamin D deficiency   . Obesity     Past Surgical History  Procedure Laterality Date  . Urethral stricture dilatation      as a child  . Knee arthroscopy  04/24/2011    Procedure: ARTHROSCOPY KNEE;  Surgeon: Javier Docker;  Location: [facility];  Service: Orthopedics;  Laterality: Right;  /Arthroscopy with debridement, right knee  . Laparoscopic appendectomy  03/21/2012    Procedure: APPENDECTOMY LAPAROSCOPIC;  Surgeon: Clovis Pu. Cornett, MD;  Location: [facility];  Service: General;  Laterality: N/A;  . Appendectomy  03/21/12    laproscopic appy       The following portions of the patient's history were reviewed and updated as appropriate: Allergies, current medications, and problem list.    Review of Systems:   12 Pt  review of systems was negative except those addressed in the HPI,     History   Social History  . Marital Status: Single    Spouse Name: N/A    Number  of Children: N/A  . Years of Education: N/A   Occupational History  . Not on file.   Social History Main Topics  . Smoking status: Never Smoker   . Smokeless tobacco: Never Used  . Alcohol Use: No  . Drug Use: No  . Sexual Activity: Not on file   Other Topics Concern  . Not on file   Social History Narrative  . No narrative on file    Objective:  Filed Vitals:   05/03/13 1604  BP: 132/72  Pulse: 70  Temp: 98.3 F (36.8 C)  Resp: 12     General appearance: alert, cooperative and appears stated age Ears: normal TM's and external ear canals both ears Throat: lips, mucosa, and tongue normal; teeth and gums normal Neck: no adenopathy, no carotid bruit, supple, symmetrical, trachea midline and thyroid not enlarged, symmetric, no tenderness/mass/nodules Back: symmetric, no curvature. ROM normal. No CVA tenderness. Lungs: clear to auscultation bilaterally Heart: regular rate and rhythm, S1, S2 normal, no murmur, click, rub or gallop Abdomen: soft, non-tender; bowel sounds normal; no masses,  no organomegaly Pulses: 2+ and symmetric Skin: Skin color, texture, turgor normal. No rashes or lesions Lymph nodes: Cervical, supraclavicular, and axillary nodes normal.  Assessment and Plan:  Acute bronchitis Steroid ihnaler and prednisone taper prescribed. No  antibiotics indicated.   Updated Medication List Outpatient Encounter Prescriptions as of 05/03/2013  Medication Sig  . iron polysaccharides (NIFEREX) 150 MG capsule Take 150 mg by mouth daily.  . rizatriptan (MAXALT) 10 MG tablet Take 10 mg by mouth as needed for migraine. May repeat in 2 hours if needed  . benzonatate (TESSALON) 200 MG capsule Take 1 capsule (200 mg total) by mouth 3 (three) times daily as needed for cough.  . [DISCONTINUED] HYDROcodone-acetaminophen (NORCO/VICODIN) 5-325 MG per tablet Take 1 tablet by mouth every 6 (six) hours as needed for moderate pain.  . [DISCONTINUED] ondansetron (ZOFRAN ODT) 4 MG  disintegrating tablet Take 1 tablet (4 mg total) by mouth every 8 (eight) hours as needed for nausea or vomiting.  . [DISCONTINUED] oxyCODONE-acetaminophen (ROXICET) 5-325 MG per tablet Take 1 tablet by mouth every 4 (four) hours as needed for severe pain.     No orders of the defined types were placed in this encounter.    No Follow-up on file.

## 2013-05-03 NOTE — Patient Instructions (Signed)
I am treating you for bronchitis  With a steroid inhaler and a non narcotic cough suppressant   Flovent diskus: one puff twice daily for 2 weeks  Tessalon Perles  One caspule every 8 hours as needed for cough suppression  If the cough has not resolved after 1-2 weeks,  Follow up with Dr Lorin Picket for additional treatment and evaluation   Bronchitis Bronchitis is the body's way of reacting to injury and/or infection (inflammation) of the bronchi. Bronchi are the air tubes that extend from the windpipe into the lungs. If the inflammation becomes severe, it may cause shortness of breath. CAUSES  Inflammation may be caused by:  A virus.  Germs (bacteria).  Dust.  Allergens.  Pollutants and many other irritants. The cells lining the bronchial tree are covered with tiny hairs (cilia). These constantly beat upward, away from the lungs, toward the mouth. This keeps the lungs free of pollutants. When these cells become too irritated and are unable to do their job, mucus begins to develop. This causes the characteristic cough of bronchitis. The cough clears the lungs when the cilia are unable to do their job. Without either of these protective mechanisms, the mucus would settle in the lungs. Then you would develop pneumonia. Smoking is a common cause of bronchitis and can contribute to pneumonia. Stopping this habit is the single most important thing you can do to help yourself. TREATMENT   Your caregiver may prescribe an antibiotic if the cough is caused by bacteria. Also, medicines that open up your airways make it easier to breathe. Your caregiver may also recommend or prescribe an expectorant. It will loosen the mucus to be coughed up. Only take over-the-counter or prescription medicines for pain, discomfort, or fever as directed by your caregiver.  Removing whatever causes the problem (smoking, for example) is critical to preventing the problem from getting worse.  Cough suppressants may be  prescribed for relief of cough symptoms.  Inhaled medicines may be prescribed to help with symptoms now and to help prevent problems from returning.  For those with recurrent (chronic) bronchitis, there may be a need for steroid medicines. SEEK IMMEDIATE MEDICAL CARE IF:   During treatment, you develop more pus-like mucus (purulent sputum).  You have a fever.  You become progressively more ill.  You have increased difficulty breathing, wheezing, or shortness of breath. It is necessary to seek immediate medical care if you are elderly or sick from any other disease. MAKE SURE YOU:   Understand these instructions.  Will watch your condition.  Will get help right away if you are not doing well or get worse. Document Released: 05/11/2005 Document Revised: 01/11/2013 Document Reviewed: 01/03/2013 [facility] Patient Information 2014 [place], [place].

## 2013-05-06 ENCOUNTER — Encounter: Payer: Self-pay | Admitting: Internal Medicine

## 2013-05-06 DIAGNOSIS — J209 Acute bronchitis, unspecified: Secondary | ICD-10-CM | POA: Insufficient documentation

## 2013-05-06 NOTE — Assessment & Plan Note (Signed)
Steroid ihnaler and prednisone taper prescribed. No  antibiotics indicated.

## 2013-05-07 ENCOUNTER — Encounter: Payer: Self-pay | Admitting: Internal Medicine

## 2013-05-08 ENCOUNTER — Other Ambulatory Visit: Payer: Self-pay | Admitting: *Deleted

## 2013-05-08 MED ORDER — GUAIFENESIN-CODEINE 100-10 MG/5ML PO SYRP: ORAL_SOLUTION | ORAL | Status: DC

## 2013-05-08 NOTE — Telephone Encounter (Signed)
rx for cheratussin.  Pt informed not to take this with other sedating medicatoins.

## 2013-08-10 ENCOUNTER — Encounter: Payer: Self-pay | Admitting: Internal Medicine

## 2013-11-01 ENCOUNTER — Encounter: Payer: Self-pay | Admitting: Internal Medicine

## 2013-11-01 DIAGNOSIS — Z13220 Encounter for screening for lipoid disorders: Secondary | ICD-10-CM

## 2013-11-01 DIAGNOSIS — E559 Vitamin D deficiency, unspecified: Secondary | ICD-10-CM

## 2013-11-01 DIAGNOSIS — D649 Anemia, unspecified: Secondary | ICD-10-CM

## 2013-11-02 NOTE — Telephone Encounter (Signed)
Orders placed for f/u labs.  

## 2013-11-15 ENCOUNTER — Encounter: Payer: Self-pay | Admitting: Internal Medicine

## 2013-12-14 ENCOUNTER — Other Ambulatory Visit (INDEPENDENT_AMBULATORY_CARE_PROVIDER_SITE_OTHER): Payer: 59

## 2013-12-14 DIAGNOSIS — E559 Vitamin D deficiency, unspecified: Secondary | ICD-10-CM

## 2013-12-14 DIAGNOSIS — Z13220 Encounter for screening for lipoid disorders: Secondary | ICD-10-CM

## 2013-12-14 DIAGNOSIS — D649 Anemia, unspecified: Secondary | ICD-10-CM

## 2013-12-14 LAB — COMPREHENSIVE METABOLIC PANEL
ALBUMIN: 4 g/dL (ref 3.5–5.2)
ALT: 15 U/L (ref 0–35)
AST: 20 U/L (ref 0–37)
Alkaline Phosphatase: 80 U/L (ref 39–117)
BUN: 10 mg/dL (ref 6–23)
CHLORIDE: 106 meq/L (ref 96–112)
CO2: 27 mEq/L (ref 19–32)
Calcium: 9.3 mg/dL (ref 8.4–10.5)
Creatinine, Ser: 0.7 mg/dL (ref 0.4–1.2)
GFR: 101.37 mL/min (ref >60.00)
Glucose, Bld: 87 mg/dL (ref 70–99)
POTASSIUM: 4.2 meq/L (ref 3.5–5.1)
Sodium: 139 mEq/L (ref 135–145)
TOTAL PROTEIN: 6.9 g/dL (ref 6.0–8.3)
Total Bilirubin: 0.4 mg/dL (ref 0.2–1.2)

## 2013-12-14 LAB — FERRITIN: Ferritin: 3.4 ng/mL — ABNORMAL LOW (ref 10.0–291.0)

## 2013-12-14 LAB — CBC WITH DIFFERENTIAL/PLATELET
BASOS ABS: 0 10*3/uL (ref 0.0–0.1)
Basophils Relative: 0.5 % (ref 0.0–3.0)
EOS ABS: 0.1 10*3/uL (ref 0.0–0.7)
Eosinophils Relative: 1.8 % (ref 0.0–5.0)
HCT: 33.4 % — ABNORMAL LOW (ref 36.0–46.0)
Hemoglobin: 10.3 g/dL — ABNORMAL LOW (ref 12.0–15.0)
LYMPHS PCT: 28.1 % (ref 12.0–46.0)
Lymphs Abs: 1.7 10*3/uL (ref 0.7–4.0)
MCHC: 30.8 g/dL (ref 30.0–36.0)
MCV: 69.2 fl — ABNORMAL LOW (ref 78.0–100.0)
MONO ABS: 0.4 10*3/uL (ref 0.1–1.0)
Monocytes Relative: 5.7 % (ref 3.0–12.0)
NEUTROS PCT: 63.9 % (ref 43.0–77.0)
Neutro Abs: 4 10*3/uL (ref 1.4–7.7)
Platelets: 422 10*3/uL — ABNORMAL HIGH (ref 150.0–400.0)
RBC: 4.83 Mil/uL (ref 3.87–5.11)
RDW: 19.5 % — ABNORMAL HIGH (ref 11.5–15.5)
WBC: 6.2 10*3/uL (ref 4.0–10.5)

## 2013-12-14 LAB — LIPID PANEL
CHOL/HDL RATIO: 4
Cholesterol: 170 mg/dL (ref 0–200)
HDL: 45.9 mg/dL (ref >39.00)
LDL Cholesterol: 113 mg/dL — ABNORMAL HIGH (ref 0–99)
NONHDL: 124.1
TRIGLYCERIDES: 57 mg/dL (ref 0.0–149.0)
VLDL: 11.4 mg/dL (ref 0.0–40.0)

## 2013-12-14 LAB — TSH: TSH: 1.66 u[IU]/mL (ref 0.35–4.50)

## 2013-12-14 LAB — VITAMIN B12: VITAMIN B 12: 269 pg/mL (ref 211–911)

## 2013-12-14 LAB — IBC PANEL
IRON: 17 ug/dL — AB (ref 42–145)
Saturation Ratios: 3.6 % — ABNORMAL LOW (ref 20.0–50.0)
TRANSFERRIN: 333.2 mg/dL (ref 212.0–360.0)

## 2013-12-14 LAB — VITAMIN D 25 HYDROXY (VIT D DEFICIENCY, FRACTURES): VITD: 12.9 ng/mL — AB (ref 30.00–100.00)

## 2013-12-15 ENCOUNTER — Encounter: Payer: Self-pay | Admitting: Internal Medicine

## 2013-12-15 MED ORDER — ERGOCALCIFEROL 1.25 MG (50000 UT) PO CAPS: 50000.0000 [IU] | ORAL_CAPSULE | ORAL | Status: DC

## 2013-12-15 MED ORDER — INTEGRA 62.5-62.5-40-3 MG PO CAPS: ORAL_CAPSULE | ORAL | Status: DC

## 2013-12-15 NOTE — Telephone Encounter (Signed)
Sent in rx for ergocalciferol 50, 000 q week #4 with 2 refills and integra #30 with 2 refills.

## 2014-02-05 ENCOUNTER — Encounter: Payer: Self-pay | Admitting: Internal Medicine

## 2014-02-05 ENCOUNTER — Other Ambulatory Visit (HOSPITAL_COMMUNITY)
Admission: RE | Admit: 2014-02-05 | Discharge: 2014-02-05 | Disposition: A | Discharge status: Home / Self Care | Payer: 59 | Source: Ambulatory Visit | Attending: Internal Medicine | Admitting: Internal Medicine

## 2014-02-05 ENCOUNTER — Ambulatory Visit (INDEPENDENT_AMBULATORY_CARE_PROVIDER_SITE_OTHER): Payer: 59 | Admitting: Internal Medicine

## 2014-02-05 VITALS — BP 118/70 | HR 69 | Temp 98.3°F | Ht 66.0 in | Wt 229.5 lb

## 2014-02-05 DIAGNOSIS — Z124 Encounter for screening for malignant neoplasm of cervix: Secondary | ICD-10-CM | POA: Diagnosis present

## 2014-02-05 DIAGNOSIS — N200 Calculus of kidney: Secondary | ICD-10-CM

## 2014-02-05 DIAGNOSIS — Z1151 Encounter for screening for human papillomavirus (HPV): Secondary | ICD-10-CM | POA: Diagnosis present

## 2014-02-05 DIAGNOSIS — G43109 Migraine with aura, not intractable, without status migrainosus: Secondary | ICD-10-CM

## 2014-02-05 DIAGNOSIS — D508 Other iron deficiency anemias: Secondary | ICD-10-CM

## 2014-02-05 DIAGNOSIS — E559 Vitamin D deficiency, unspecified: Secondary | ICD-10-CM

## 2014-02-05 DIAGNOSIS — K589 Irritable bowel syndrome without diarrhea: Secondary | ICD-10-CM

## 2014-02-05 DIAGNOSIS — R55 Syncope and collapse: Secondary | ICD-10-CM

## 2014-02-05 MED ORDER — RIZATRIPTAN BENZOATE 10 MG PO TABS: ORAL_TABLET | ORAL | Status: DC

## 2014-02-05 NOTE — Progress Notes (Signed)
Pre visit review using our clinic review tool, if applicable. No additional management support is needed unless otherwise documented below in the visit note. 

## 2014-02-06 LAB — FERRITIN: Ferritin: 4.6 ng/mL — ABNORMAL LOW (ref 10.0–291.0)

## 2014-02-06 LAB — VITAMIN D 25 HYDROXY (VIT D DEFICIENCY, FRACTURES): VITD: 24.39 ng/mL — ABNORMAL LOW (ref 30.00–100.00)

## 2014-02-07 LAB — CBC WITH DIFFERENTIAL/PLATELET
BASOS ABS: 0 10*3/uL (ref 0.0–0.1)
Basophils Relative: 0.3 % (ref 0.0–3.0)
Eosinophils Absolute: 0.1 10*3/uL (ref 0.0–0.7)
Eosinophils Relative: 1 % (ref 0.0–5.0)
HEMATOCRIT: 34 % — AB (ref 36.0–46.0)
Hemoglobin: 10.6 g/dL — ABNORMAL LOW (ref 12.0–15.0)
Lymphocytes Relative: 18.5 % (ref 12.0–46.0)
Lymphs Abs: 1.9 10*3/uL (ref 0.7–4.0)
MCHC: 31.2 g/dL (ref 30.0–36.0)
MCV: 70.9 fl — AB (ref 78.0–100.0)
MONO ABS: 0 10*3/uL — AB (ref 0.1–1.0)
MONOS PCT: 0.2 % — AB (ref 3.0–12.0)
NEUTROS ABS: 8.3 10*3/uL — AB (ref 1.4–7.7)
Neutrophils Relative %: 80 % — ABNORMAL HIGH (ref 43.0–77.0)
Platelets: 370 10*3/uL (ref 150.0–400.0)
RBC: 4.79 Mil/uL (ref 3.87–5.11)
RDW: 20.9 % — AB (ref 11.5–15.5)
WBC: 10.4 10*3/uL (ref 4.0–10.5)

## 2014-02-08 LAB — CYTOLOGY - PAP

## 2014-02-10 ENCOUNTER — Encounter: Payer: Self-pay | Admitting: Internal Medicine

## 2014-02-11 ENCOUNTER — Encounter: Payer: Self-pay | Admitting: Internal Medicine

## 2014-02-11 NOTE — Progress Notes (Signed)
Subjective:    Patient ID: Tina Fleming, female    DOB: [birth year], 33 y.o.   MRN: [id number]  HPI 33 year old female with past history of nephrolithiasis, migraine headaches, anemia, vitamin D deficiency and IBS.  She comes in today to follow up on these issues as well as for a complete physical exam.   She has been seeing Dr Park Breed.  States she has a history of heart racing.  Previous syncope.  Last episode occurred a few years ago.  Had extensive w/up.  No reoccurrence.  Brother has ankylosing spondylitis.  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  No back pain.  She does have migraine headaches.  Diagnosed at age 24.  Has tried imitrex.  Also has tried Lexapro and zoloft.  She uses maxalt now.  Typical headache starts with her losing her vision out of one eye.  Has associated emesis.  Better.  States the "bad headaches" are better.  Less frequent.  States if she does not get enough sleep, will trigger.  Has IBS.  Has had colonoscopy at age [age].  Saw Dr Servando Snare.  Alternates between diarrhea and constipation.       Past Medical History  Diagnosis Date  . Heart murmur     asymtomatic  . Anemia     iron suppliments in past-not currently  . Nephrolithiasis   . Migraine   . IBS (irritable bowel syndrome)   . Anemia   . Vitamin D deficiency   . Obesity     Outpatient Encounter Prescriptions as of 02/05/2014  Medication Sig  . ergocalciferol (VITAMIN D2) 50000 UNITS capsule Take 1 capsule (50,000 Units total) by mouth once a week.  . Fe Fum-FePoly-Vit C-Vit B3 (INTEGRA) 62.5-62.5-40-3 MG CAPS One tablet per day  . rizatriptan (MAXALT) 10 MG tablet Use as directed  . [DISCONTINUED] benzonatate (TESSALON) 200 MG capsule Take 1 capsule (200 mg total) by mouth 3 (three) times daily as needed for cough.  . [DISCONTINUED] guaiFENesin-codeine (CHERATUSSIN AC) 100-10 MG/5ML syrup by mouth q hs prn  . [DISCONTINUED] iron polysaccharides (NIFEREX) 150 MG capsule Take 150 mg by mouth daily.  .  [DISCONTINUED] rizatriptan (MAXALT) 10 MG tablet Take 10 mg by mouth as needed for migraine. May repeat in 2 hours if needed    Review of Systems Patient denies lightheadedness or dizziness.  Headaches as outlined.  Has had intolerance to multiple medications.  Takes maxalt now.  Severe headaches are better.  No chest pain, tightness or palpitations.  No increased shortness of breath, cough or congestion.  No nausea or vomiting. No acid reflux.  No abdominal pain or cramping.  No BRBPR or melana.  Alternating diarrhea and constipation. No urine change.        Objective:   Physical Exam  Filed Vitals:   02/05/14 1556  BP: 118/70  Pulse: 69  Temp: 98.3 F (23.48 C)   33 year old female in no acute distress.   HEENT:  Nares- clear.  Oropharynx - without lesions. NECK:  Supple.  Nontender.  No audible bruit.  HEART:  Appears to be regular. LUNGS:  No crackles or wheezing audible.  Respirations even and unlabored.  RADIAL PULSE:  Equal bilaterally.    BREASTS:  No nipple discharge or nipple retraction present.  Could not appreciate any distinct nodules or axillary adenopathy.  ABDOMEN:  Soft, nontender.  Bowel sounds present and normal.  No audible abdominal bruit.  GU:  Normal external genitalia.  Vaginal vault  without lesions.  Cervix identified.  Pap performed. Could not appreciate any adnexal masses or tenderness.   RECTAL:  Not performed.    EXTREMITIES:  No increased edema present.  DP pulses palpable and equal bilaterally.        Assessment & Plan:  HEALTH MAINTENANCE.  Physical today.  Pap today.    I spent 25 minutes with the patient and more than 50% of the time was spent in consultation regarding the above.

## 2014-02-11 NOTE — Assessment & Plan Note (Signed)
Continue supplements.  Follow vitamin D level.    

## 2014-02-11 NOTE — Assessment & Plan Note (Addendum)
Last kidney stone - a couple of years ago.  Stable.  Follow.

## 2014-02-11 NOTE — Assessment & Plan Note (Signed)
Has a history of anemia.  Check cbc and ferritin.

## 2014-02-11 NOTE — Assessment & Plan Note (Signed)
Headaches as outlined.  Better.  Uses maxalt prn.  Feels stable.  Follow.

## 2014-02-11 NOTE — Assessment & Plan Note (Signed)
Extensive w/up.  Not an issue now.  Follow.

## 2014-02-11 NOTE — Assessment & Plan Note (Signed)
Alternating diarrhea and constipation.  Saw Dr Servando Snare.  Has had colonoscopy.  Follow.

## 2014-02-12 ENCOUNTER — Other Ambulatory Visit: Payer: Self-pay | Admitting: Internal Medicine

## 2014-02-12 ENCOUNTER — Encounter: Payer: Self-pay | Admitting: Internal Medicine

## 2014-02-12 DIAGNOSIS — D649 Anemia, unspecified: Secondary | ICD-10-CM

## 2014-02-12 NOTE — Progress Notes (Signed)
Order placed for f/u labs.  

## 2014-02-14 NOTE — Telephone Encounter (Signed)
Unread mychart message mailed to patient 

## 2014-04-16 ENCOUNTER — Other Ambulatory Visit: Payer: Self-pay

## 2014-04-21 ENCOUNTER — Telehealth: Payer: 59 | Admitting: Nurse Practitioner

## 2014-04-21 DIAGNOSIS — R197 Diarrhea, unspecified: Secondary | ICD-10-CM

## 2014-04-21 MED ORDER — AZITHROMYCIN 500 MG PO TABS: 500.0000 mg | ORAL_TABLET | Freq: Every day | ORAL | Status: DC

## 2014-04-21 NOTE — Progress Notes (Signed)
We are sorry that you are not feeling well.  Here is how we plan to help!  Based on what you have shared with me it looks like you have Acute Infectious Diarrhea.  Most cases of acute diarrhea are due to infections with virus and bacteria and are self-limited conditions lasting less than 14 days.  For your symptoms you may take Imodium 2 mg tablets that are over the counter at your local pharmacy. Take two tablet now and then one after each loose stool up to 6 a day.  Antibiotics are not needed for most people with diarrhea.  Optional: I have sent in  azithromycin 500 mg daily for 3 days.  HOME CARE  We recommend changing your diet to help with your symptoms for the next few days.  Drink plenty of fluids that contain water salt and sugar. Sports drinks such as Gatorade may help.   You may try broths, soups, bananas, applesauce, soft breads, mashed potatoes or crackers.   You are considered infectious for as long as the diarrhea continues. Hand washing or use of alcohol based hand sanitizers is recommend.  It is best to stay out of work or school until your symptoms stop.   If your stool remains black you will need to see your PCP ASAP.  GET HELP RIGHT AWAY  If you have dark yellow colored urine or do not pass urine frequently you should drink more fluids.    If your symptoms worsen   If you feel like you are going to pass out (faint)  You have a new problem  MAKE SURE YOU   Understand these instructions.  Will watch your condition.  Will get help right away if you are not doing well or get worse.  Your e-visit answers were reviewed by a board certified advanced clinical practitioner to complete your personal care plan.  Depending on the condition, your plan could have included both over the counter or prescription medications.  Please review your pharmacy choice.  If there is a problem, you may call our nursing hot line at [phone] and have the prescription routed to  another pharmacy.  Your safety is important to us.  If you have drug allergies check your prescription carefully.    You can use MyChart to ask questions about today's visit, request a non-urgent call back, or ask for a work or school excuse.  You will get an e-mail in the next two days asking about your experience.  I hope that your e-visit has been valuable and will speed your recovery. Thank you for using e-visits.

## 2014-04-23 ENCOUNTER — Other Ambulatory Visit (INDEPENDENT_AMBULATORY_CARE_PROVIDER_SITE_OTHER): Payer: 59

## 2014-04-23 ENCOUNTER — Other Ambulatory Visit: Payer: Self-pay

## 2014-04-23 DIAGNOSIS — D649 Anemia, unspecified: Secondary | ICD-10-CM

## 2014-04-23 LAB — CBC WITH DIFFERENTIAL/PLATELET
BASOS PCT: 0.6 % (ref 0.0–3.0)
Basophils Absolute: 0 10*3/uL (ref 0.0–0.1)
EOS PCT: 1.7 % (ref 0.0–5.0)
Eosinophils Absolute: 0.1 10*3/uL (ref 0.0–0.7)
HCT: 37.5 % (ref 36.0–46.0)
Hemoglobin: 11.7 g/dL — ABNORMAL LOW (ref 12.0–15.0)
LYMPHS ABS: 2.3 10*3/uL (ref 0.7–4.0)
Lymphocytes Relative: 29.4 % (ref 12.0–46.0)
MCHC: 31.1 g/dL (ref 30.0–36.0)
MCV: 72.2 fl — AB (ref 78.0–100.0)
MONO ABS: 0.4 10*3/uL (ref 0.1–1.0)
Monocytes Relative: 5.6 % (ref 3.0–12.0)
Neutro Abs: 4.8 10*3/uL (ref 1.4–7.7)
Neutrophils Relative %: 62.7 % (ref 43.0–77.0)
Platelets: 382 10*3/uL (ref 150.0–400.0)
RBC: 5.19 Mil/uL — ABNORMAL HIGH (ref 3.87–5.11)
RDW: 19.9 % — ABNORMAL HIGH (ref 11.5–15.5)
WBC: 7.7 10*3/uL (ref 4.0–10.5)

## 2014-04-23 LAB — FERRITIN: FERRITIN: 11.7 ng/mL (ref 10.0–291.0)

## 2014-04-26 ENCOUNTER — Encounter: Payer: Self-pay | Admitting: Internal Medicine

## 2014-04-26 MED ORDER — INTEGRA 62.5-62.5-40-3 MG PO CAPS: ORAL_CAPSULE | ORAL | Status: DC

## 2014-04-26 NOTE — Telephone Encounter (Signed)
rx sent in for integra #30 with 3 refills

## 2014-05-09 ENCOUNTER — Encounter: Payer: Self-pay | Admitting: Internal Medicine

## 2014-05-10 MED ORDER — RIZATRIPTAN BENZOATE 10 MG PO TBDP: 10.0000 mg | ORAL_TABLET | ORAL | Status: DC | PRN

## 2014-05-10 NOTE — Telephone Encounter (Signed)
Changed and refilled maxalt disintegrating tablets #10 with no refills.

## 2014-05-16 ENCOUNTER — Encounter: Payer: Self-pay | Admitting: *Deleted

## 2014-05-22 ENCOUNTER — Telehealth: Payer: 59 | Admitting: Nurse Practitioner

## 2014-05-22 DIAGNOSIS — J208 Acute bronchitis due to other specified organisms: Secondary | ICD-10-CM

## 2014-05-22 MED ORDER — BENZONATATE 200 MG PO CAPS: 200.0000 mg | ORAL_CAPSULE | Freq: Two times a day (BID) | ORAL | Status: DC | PRN

## 2014-05-22 NOTE — Progress Notes (Signed)
We are sorry that you are not feeling well.  Here is how we plan to help!  Based on what you have shared with me it looks like you have upper respiratory tract inflammation that has resulted in a signification cough.  Inflammation and infection in the upper respiratory tract is commonly called bronchitis and has four common causes:  Allergies, Viral Infections, Acid Reflux and Bacterial Infections.  Allergies, viruses and acid reflux are treated by controlling symptoms or eliminating the cause. An example might be a cough caused by taking certain blood pressure medications. You stop the cough by changing the medication. Another example might be a cough caused by acid reflux. Controlling the reflux helps control the cough.  Based on your presentation I believe you most likely have A cough due to a virus.  This is called viral bronchitis and is best treated by rest, plenty of fluids and control of the cough.  You may use Ibuprofen or Tylenol as directed to help your symptoms.    In addition you may use A prescription cough medication called Tessalon Perles 100mg. You may take 1-2 capsules every 8 hours as needed for your cough.    HOME CARE . Only take medications as instructed by your medical team. . Complete the entire course of an antibiotic. . Drink plenty of fluids and get plenty of rest. . Avoid close contacts especially the very young and the elderly . Cover your mouth if you cough or cough into your sleeve. . Always remember to wash your hands . A steam or ultrasonic humidifier can help congestion.    GET HELP RIGHT AWAY IF: . You develop worsening fever. . You become short of breath . You cough up blood. . Your symptoms persist after you have completed your treatment plan MAKE SURE YOU   Understand these instructions.  Will watch your condition.  Will get help right away if you are not doing well or get worse.  Your e-visit answers were reviewed by a board certified advanced  clinical practitioner to complete your personal care plan.  Depending on the condition, your plan could have included both over the counter or prescription medications.  If there is a problem please reply  once you have received a response from your provider.  Your safety is important to us.  If you have drug allergies check your prescription carefully.    You can use MyChart to ask questions about today's visit, request a non-urgent call back, or ask for a work or school excuse.  You will get an e-mail in the next two days asking about your experience.  I hope that your e-visit has been valuable and will speed your recovery. Thank you for using e-visits.   

## 2014-08-06 ENCOUNTER — Encounter: Payer: Self-pay | Admitting: Internal Medicine

## 2014-08-06 ENCOUNTER — Ambulatory Visit (INDEPENDENT_AMBULATORY_CARE_PROVIDER_SITE_OTHER): Payer: 59 | Admitting: Internal Medicine

## 2014-08-06 VITALS — BP 120/78 | HR 66 | Temp 98.3°F | Ht 66.0 in | Wt 239.4 lb

## 2014-08-06 DIAGNOSIS — H938X9 Other specified disorders of ear, unspecified ear: Secondary | ICD-10-CM | POA: Insufficient documentation

## 2014-08-06 DIAGNOSIS — K589 Irritable bowel syndrome without diarrhea: Secondary | ICD-10-CM

## 2014-08-06 DIAGNOSIS — D649 Anemia, unspecified: Secondary | ICD-10-CM

## 2014-08-06 DIAGNOSIS — Z0000 Encounter for general adult medical examination without abnormal findings: Secondary | ICD-10-CM

## 2014-08-06 DIAGNOSIS — H938X3 Other specified disorders of ear, bilateral: Secondary | ICD-10-CM

## 2014-08-06 DIAGNOSIS — L988 Other specified disorders of the skin and subcutaneous tissue: Secondary | ICD-10-CM

## 2014-08-06 DIAGNOSIS — N649 Disorder of breast, unspecified: Secondary | ICD-10-CM

## 2014-08-06 DIAGNOSIS — G43109 Migraine with aura, not intractable, without status migrainosus: Secondary | ICD-10-CM

## 2014-08-06 NOTE — Assessment & Plan Note (Signed)
Physical 02/05/14.  PAP at physical negative with negative HPV.

## 2014-08-06 NOTE — Assessment & Plan Note (Signed)
Persistent lesion.  Recently changed.  Refer to dermatology.

## 2014-08-06 NOTE — Progress Notes (Signed)
Patient ID: Jim LikeJennifer N Hsia, female   DOB: 01/26/1981, 34 y.o.   MRN: [id number]   Subjective:    Patient ID: Jim LikeJennifer N Droke, female    DOB: 09/28/1980, 34 y.o.   MRN: [id number]  HPI  Patient here for a scheduled follow up.  Reports increased bilateral ear fullness.  No pain.  Has been present for approximately two months.  Worse when she wakes up.  Some increased nasal congestion.  Increased drainage.  Sore throat in the am.  Some cough.  No chest congestion.  No sob.  No acid reflux.  Has tried otc nyquil and claritin.  She also has noticed a lesion on her right breast. Noticed a change - becoming darker.  Non tender.  Bowels stable.  Headaches stable.    Past Medical History  Diagnosis Date  . Heart murmur     asymtomatic  . Anemia     iron suppliments in past-not currently  . Nephrolithiasis   . Migraine   . IBS (irritable bowel syndrome)   . Anemia   . Vitamin D deficiency   . Obesity     Current Outpatient Prescriptions on File Prior to Visit  Medication Sig Dispense Refill  . Fe Fum-FePoly-Vit C-Vit B3 (INTEGRA) 62.5-62.5-40-3 MG CAPS One tablet per day 30 capsule 3  . rizatriptan (MAXALT-MLT) 10 MG disintegrating tablet Take 1 tablet (10 mg total) by mouth as needed for migraine. May repeat in 2 hours if needed 10 tablet 0   No current facility-administered medications on file prior to visit.    Review of Systems  Constitutional: Negative for appetite change and unexpected weight change.  HENT: Positive for congestion (nasal congestion), postnasal drip and sore throat (in the am. ). Negative for sinus pressure.        Bilateral ear fullness.  No pain.   Respiratory: Positive for cough (some cough.  no chest congestion. ). Negative for chest tightness and shortness of breath.   Cardiovascular: Negative for chest pain, palpitations and leg swelling.  Gastrointestinal: Negative for nausea, vomiting, abdominal pain and diarrhea.       No acid reflux.    Skin: Negative  for rash.       Right breast lesion  Neurological: Negative for dizziness, light-headedness and headaches.       Objective:    Physical Exam  Constitutional: She appears well-developed and well-nourished. No distress.  HENT:  Right Ear: External ear normal.  Left Ear: External ear normal.  Nose: Nose normal.  Mouth/Throat: Oropharynx is clear and moist.  TMs - no erythema visualized.    Neck: Neck supple. No thyromegaly present.  Cardiovascular: Normal rate and regular rhythm.   Pulmonary/Chest: Breath sounds normal. No respiratory distress. She has no wheezes.  Abdominal: Soft. Bowel sounds are normal. There is no tenderness.  Musculoskeletal: She exhibits no edema or tenderness.  Lymphadenopathy:    She has no cervical adenopathy.  Skin:  Small dark (brown) lesion right breast.  Non tender.     BP 120/78 mmHg  Pulse 66  Temp(Src) 98.3 F (36.8 C) (Oral)  Ht 5\' 6"  (1.676 m)  Wt 239 lb 6 oz (108.58 kg)  BMI 38.65 kg/m2  SpO2 96% Wt Readings from Last 3 Encounters:  08/06/14 239 lb 6 oz (108.58 kg)  02/05/14 229 lb 8 oz (104.101 kg)  05/03/13 224 lb (101.606 kg)     Lab Results  Component Value Date   WBC 7.7 04/23/2014   HGB 11.7* 04/23/2014  HCT 37.5 04/23/2014   PLT 382.0 04/23/2014   GLUCOSE 87 12/14/2013   CHOL 170 12/14/2013   TRIG 57.0 12/14/2013   HDL 45.90 12/14/2013   LDLCALC 113* 12/14/2013   ALT 15 12/14/2013   AST 20 12/14/2013   NA 139 12/14/2013   K 4.2 12/14/2013   CL 106 12/14/2013   CREATININE 0.7 12/14/2013   BUN 10 12/14/2013   CO2 27 12/14/2013   TSH 1.66 12/14/2013       Assessment & Plan:   Problem List Items Addressed This Visit    Anemia    Hemoglobin on last check 11/7.  Follow cbc and iron stores.        Ear fullness - Primary    Bilateral ear fullness.  No pain.  Exam as outlined.  Treat with nasacort nasal spray and saline nasal spray as directed.  Robitussin as directed.  Call with update over the next 1-2 weeks.          Health care maintenance    Physical 02/05/14.  PAP at physical negative with negative HPV.       IBS (irritable bowel syndrome)    Bowels stable.  Follow.       Migraine    Stable.       Skin lesion of breast    Persistent lesion.  Recently changed.  Refer to dermatology.        Relevant Orders   Ambulatory referral to Dermatology       Dale Dearing, MD

## 2014-08-06 NOTE — Patient Instructions (Signed)
Saline nasal spray - flush nose at least 2-3x/day  nasacort nasal spray - 2 sprays each nostril one time per day.  Do this in the evening.    Robitussin twice a day 

## 2014-08-06 NOTE — Assessment & Plan Note (Signed)
Bilateral ear fullness.  No pain.  Exam as outlined.  Treat with nasacort nasal spray and saline nasal spray as directed.  Robitussin as directed.  Call with update over the next 1-2 weeks.

## 2014-08-06 NOTE — Assessment & Plan Note (Signed)
Hemoglobin on last check 11/7.  Follow cbc and iron stores.

## 2014-08-06 NOTE — Assessment & Plan Note (Signed)
Stable

## 2014-08-06 NOTE — Assessment & Plan Note (Signed)
Bowels stable.  Follow.   

## 2014-08-06 NOTE — Progress Notes (Signed)
Pre visit review using our clinic review tool, if applicable. No additional management support is needed unless otherwise documented below in the visit note. 

## 2015-02-08 ENCOUNTER — Ambulatory Visit (INDEPENDENT_AMBULATORY_CARE_PROVIDER_SITE_OTHER): Payer: 59 | Admitting: Internal Medicine

## 2015-02-08 ENCOUNTER — Encounter: Payer: Self-pay | Admitting: Internal Medicine

## 2015-02-08 VITALS — BP 112/80 | HR 61 | Temp 98.2°F | Ht 66.0 in | Wt 229.0 lb

## 2015-02-08 DIAGNOSIS — D649 Anemia, unspecified: Secondary | ICD-10-CM

## 2015-02-08 DIAGNOSIS — Z0000 Encounter for general adult medical examination without abnormal findings: Secondary | ICD-10-CM | POA: Diagnosis not present

## 2015-02-08 DIAGNOSIS — R5383 Other fatigue: Secondary | ICD-10-CM

## 2015-02-08 DIAGNOSIS — J029 Acute pharyngitis, unspecified: Secondary | ICD-10-CM

## 2015-02-08 DIAGNOSIS — E7800 Pure hypercholesterolemia, unspecified: Secondary | ICD-10-CM

## 2015-02-08 DIAGNOSIS — K589 Irritable bowel syndrome without diarrhea: Secondary | ICD-10-CM

## 2015-02-08 DIAGNOSIS — E559 Vitamin D deficiency, unspecified: Secondary | ICD-10-CM

## 2015-02-08 DIAGNOSIS — G43109 Migraine with aura, not intractable, without status migrainosus: Secondary | ICD-10-CM

## 2015-02-08 LAB — CBC WITH DIFFERENTIAL/PLATELET
BASOS ABS: 0.1 10*3/uL (ref 0.0–0.1)
Basophils Relative: 0.9 % (ref 0.0–3.0)
EOS PCT: 2.1 % (ref 0.0–5.0)
Eosinophils Absolute: 0.2 10*3/uL (ref 0.0–0.7)
HEMATOCRIT: 36.7 % (ref 36.0–46.0)
HEMOGLOBIN: 11.6 g/dL — AB (ref 12.0–15.0)
LYMPHS PCT: 21.6 % (ref 12.0–46.0)
Lymphs Abs: 1.6 10*3/uL (ref 0.7–4.0)
MCHC: 31.6 g/dL (ref 30.0–36.0)
MCV: 72 fl — AB (ref 78.0–100.0)
MONOS PCT: 5 % (ref 3.0–12.0)
Monocytes Absolute: 0.4 10*3/uL (ref 0.1–1.0)
NEUTROS PCT: 70.4 % (ref 43.0–77.0)
Neutro Abs: 5.3 10*3/uL (ref 1.4–7.7)
Platelets: 377 10*3/uL (ref 150.0–400.0)
RBC: 5.09 Mil/uL (ref 3.87–5.11)
RDW: 19.3 % — ABNORMAL HIGH (ref 11.5–15.5)
WBC: 7.5 10*3/uL (ref 4.0–10.5)

## 2015-02-08 LAB — LIPID PANEL
CHOL/HDL RATIO: 4
Cholesterol: 180 mg/dL (ref 0–200)
HDL: 46.4 mg/dL (ref >39.00)
LDL Cholesterol: 111 mg/dL — ABNORMAL HIGH (ref 0–99)
NonHDL: 133.83
TRIGLYCERIDES: 115 mg/dL (ref 0.0–149.0)
VLDL: 23 mg/dL (ref 0.0–40.0)

## 2015-02-08 LAB — FERRITIN: Ferritin: 5.3 ng/mL — ABNORMAL LOW (ref 10.0–291.0)

## 2015-02-08 LAB — COMPREHENSIVE METABOLIC PANEL
ALBUMIN: 4.1 g/dL (ref 3.5–5.2)
ALK PHOS: 91 U/L (ref 39–117)
ALT: 20 U/L (ref 0–35)
AST: 19 U/L (ref 0–37)
BILIRUBIN TOTAL: 0.4 mg/dL (ref 0.2–1.2)
BUN: 12 mg/dL (ref 6–23)
CALCIUM: 9.5 mg/dL (ref 8.4–10.5)
CO2: 27 mEq/L (ref 19–32)
Chloride: 105 mEq/L (ref 96–112)
Creatinine, Ser: 0.72 mg/dL (ref 0.40–1.20)
GFR: 99.03 mL/min (ref >60.00)
GLUCOSE: 79 mg/dL (ref 70–99)
Potassium: 4.7 mEq/L (ref 3.5–5.1)
Sodium: 139 mEq/L (ref 135–145)
TOTAL PROTEIN: 7.1 g/dL (ref 6.0–8.3)

## 2015-02-08 LAB — TSH: TSH: 1.57 u[IU]/mL (ref 0.35–4.50)

## 2015-02-08 LAB — VITAMIN D 25 HYDROXY (VIT D DEFICIENCY, FRACTURES): VITD: 10.53 ng/mL — ABNORMAL LOW (ref 30.00–100.00)

## 2015-02-08 NOTE — Progress Notes (Signed)
Pre-visit discussion using our clinic review tool. No additional management support is needed unless otherwise documented below in the visit note.  

## 2015-02-08 NOTE — Progress Notes (Signed)
Patient ID: Tina Fleming, female   DOB: [birth year], 34 y.o.   MRN: [id number]   Subjective:    Patient ID: Tina Fleming, female    DOB: [birth year], 34 y.o.   MRN: [id number]  HPI  Patient here for her physical exam.  She also reports not feeling well.  She recently traveled with her mother to [place].  Did not go into the [place] zone.  States her mother was sick.  She noticed on her way back - not feeling well.  States has sore throat.  Started this am.  No fever.  No rash.  Some allergy symptoms, but no change from previous allergy issues.  No significant nasal congestion.  No headache.  No earache.  No sinus pressure.  No chest congestion or cough.  No sob. No acid reflux.  Reports fatigue.  No diarrhea.  Starting to feel better.  Energy is better.  Appetite is improving.  Eating and drinking now.  Denies possibility of being pregnant.  Has been off her iron.    Past Medical History  Diagnosis Date  . Heart murmur     asymtomatic  . Anemia     iron suppliments in past-not currently  . Nephrolithiasis   . Migraine   . IBS (irritable bowel syndrome)   . Anemia   . Vitamin D deficiency   . Obesity    Past Surgical History  Procedure Laterality Date  . Urethral stricture dilatation      as a child  . Knee arthroscopy  04/24/2011    Procedure: ARTHROSCOPY KNEE;  Surgeon: Johnn Hai;  Location: [facility];  Service: Orthopedics;  Laterality: Right;  /Arthroscopy with debridement, right knee  . Laparoscopic appendectomy  03/21/2012    Procedure: APPENDECTOMY LAPAROSCOPIC;  Surgeon: Joyice Faster. Cornett, MD;  Location: [facility];  Service: General;  Laterality: N/A;  . Appendectomy  03/21/12    laproscopic appy   Family History  Problem Relation Age of Onset  . Cancer Mother     pancreatic  . Arthritis Mother   . Hyperlipidemia Mother   . Hypertension Mother   . Cancer Father     skin  . Arthritis Father   . Hyperlipidemia Father   . Hypertension Father   .  Diabetes Father   . Ankylosing spondylitis Brother   . Spondylitis Brother    Social History   Social History  . Marital Status: Single    Spouse Name: N/A  . Number of Children: N/A  . Years of Education: N/A   Social History Main Topics  . Smoking status: Never Smoker   . Smokeless tobacco: Never Used  . Alcohol Use: No  . Drug Use: No  . Sexual Activity: Not Asked   Other Topics Concern  . None   Social History Narrative    Outpatient Encounter Prescriptions as of 02/08/2015  Medication Sig  . Fe Fum-FePoly-Vit C-Vit B3 (INTEGRA) 62.5-62.5-40-3 MG CAPS One tablet per day  . rizatriptan (MAXALT-MLT) 10 MG disintegrating tablet Take 1 tablet (10 mg total) by mouth as needed for migraine. May repeat in 2 hours if needed   No facility-administered encounter medications on file as of 02/08/2015.    Review of Systems  Constitutional: Positive for fatigue. Negative for fever and unexpected weight change.       Appetite improved.   HENT: Positive for sore throat. Negative for congestion and sinus pressure.   Eyes: Negative for pain and discharge.  Respiratory: Negative  for cough, chest tightness and shortness of breath.   Cardiovascular: Negative for chest pain, palpitations and leg swelling.  Gastrointestinal: Negative for nausea, vomiting, abdominal pain and diarrhea.  Genitourinary: Negative for dysuria and difficulty urinating.  Musculoskeletal: Negative for back pain and joint swelling.  Skin: Negative for color change and rash.  Neurological: Negative for dizziness, light-headedness and headaches.       Previous headache - sinus - resolved.  No migraine headaches recently.    Hematological: Negative for adenopathy. Does not bruise/bleed easily.  Psychiatric/Behavioral: Negative for dysphoric mood and agitation.       Objective:    Physical Exam  Constitutional: She is oriented to person, place, and time. She appears well-developed and well-nourished. No distress.    HENT:  Nose: Nose normal.  Minimal erythema - posterior oropharynx.   Eyes: Conjunctivae are normal. Right eye exhibits no discharge. Left eye exhibits no discharge. No scleral icterus.  Neck: Neck supple. No thyromegaly present.  Cardiovascular: Normal rate and regular rhythm.   Pulmonary/Chest: Breath sounds normal. No accessory muscle usage. No tachypnea. No respiratory distress. She has no decreased breath sounds. She has no wheezes. She has no rhonchi. Right breast exhibits no inverted nipple, no mass, no nipple discharge and no tenderness (no axillary adenopathy). Left breast exhibits no inverted nipple, no mass, no nipple discharge and no tenderness (no axilarry adenopathy).  Abdominal: Soft. Bowel sounds are normal. There is no tenderness.  Musculoskeletal: She exhibits no edema or tenderness.  Lymphadenopathy:    She has no cervical adenopathy.  Neurological: She is alert and oriented to person, place, and time.  Skin: Skin is warm. No rash noted. No erythema.  Psychiatric: She has a normal mood and affect. Her behavior is normal.    BP 112/80 mmHg  Pulse 61  Temp(Src) 98.2 F (36.8 C) (Oral)  Ht _0  (1.676 m)  Wt 229 lb (103.874 kg)  BMI 36.98 kg/m2  SpO2 95%  LMP 01/13/2015 (Approximate) Wt Readings from Last 3 Encounters:  02/08/15 229 lb (103.874 kg)  08/06/14 239 lb 6 oz (108.58 kg)  02/05/14 229 lb 8 oz (104.101 kg)     Lab Results  Component Value Date   WBC 7.5 02/08/2015   HGB 11.6* 02/08/2015   HCT 36.7 02/08/2015   PLT 377.0 02/08/2015   GLUCOSE 79 02/08/2015   CHOL 180 02/08/2015   TRIG 115.0 02/08/2015   HDL 46.40 02/08/2015   LDLCALC 111* 02/08/2015   ALT 20 02/08/2015   AST 19 02/08/2015   NA 139 02/08/2015   K 4.7 02/08/2015   CL 105 02/08/2015   CREATININE 0.72 02/08/2015   BUN 12 02/08/2015   CO2 27 02/08/2015   TSH 1.57 02/08/2015       Assessment & Plan:   Problem List Items Addressed This Visit    Anemia - Primary    Has not  been on iron supplements.  Back on now.  Check cbc and ferritin.        Relevant Orders   CBC with Differential/Platelet (Completed)   Ferritin (Completed)   Throat culture ([facility])   Fatigue    Fatigue as outlined.  Question of viral illness.  Sore throat as outlined.  Check throat culture.  Hold on abx.  Check cbc, met c and tsh.        Relevant Orders   Comprehensive metabolic panel (Completed)   TSH (Completed)   Throat culture [facility])   Health care maintenance  Physical today 02/08/15.  PAP 02/05/14 - negative with negative HPV.        IBS (irritable bowel syndrome)    Bowels stable.        Migraine    No migraine headaches recently.  Follow       Sore throat    Sore throat as outlined.  Reports fatigue.  Is improving.  Eating and drinking.  No rash.  No fever.  Check throat culture.  Rest.  Stay hydrated.  Check cbc and tsh as outlined.  Follow.  Call with update.        Vitamin D deficiency    Not on supplements now.  Recheck vitamin D level today with labs.        Relevant Orders   Vit D  25 hydroxy (rtn osteoporosis monitoring) (Completed)   Throat culture [facility])    Other Visit Diagnoses    Hypercholesterolemia        Relevant Orders    Lipid panel (Completed)    Throat culture [facility])        Einar Pheasant, MD

## 2015-02-09 ENCOUNTER — Encounter: Payer: Self-pay | Admitting: Internal Medicine

## 2015-02-09 DIAGNOSIS — J029 Acute pharyngitis, unspecified: Secondary | ICD-10-CM | POA: Insufficient documentation

## 2015-02-09 LAB — CULTURE, GROUP A STREP: Organism ID, Bacteria: NORMAL

## 2015-02-09 NOTE — Assessment & Plan Note (Signed)
Has not been on iron supplements.  Back on now.  Check cbc and ferritin.

## 2015-02-09 NOTE — Assessment & Plan Note (Signed)
Sore throat as outlined.  Reports fatigue.  Is improving.  Eating and drinking.  No rash.  No fever.  Check throat culture.  Rest.  Stay hydrated.  Check cbc and tsh as outlined.  Follow.  Call with update.

## 2015-02-09 NOTE — Assessment & Plan Note (Signed)
Bowels stable.  

## 2015-02-09 NOTE — Assessment & Plan Note (Signed)
Fatigue as outlined.  Question of viral illness.  Sore throat as outlined.  Check throat culture.  Hold on abx.  Check cbc, met c and tsh.   

## 2015-02-09 NOTE — Assessment & Plan Note (Signed)
No migraine headaches recently.  Follow

## 2015-02-09 NOTE — Assessment & Plan Note (Signed)
Physical today 02/08/15.  PAP 02/05/14 - negative with negative HPV.

## 2015-02-09 NOTE — Assessment & Plan Note (Signed)
Not on supplements now.  Recheck vitamin D level today with labs.

## 2015-02-10 ENCOUNTER — Other Ambulatory Visit: Payer: Self-pay | Admitting: Internal Medicine

## 2015-02-10 ENCOUNTER — Encounter: Payer: Self-pay | Admitting: Internal Medicine

## 2015-02-10 DIAGNOSIS — D509 Iron deficiency anemia, unspecified: Secondary | ICD-10-CM

## 2015-02-10 DIAGNOSIS — E559 Vitamin D deficiency, unspecified: Secondary | ICD-10-CM

## 2015-02-10 MED ORDER — ERGOCALCIFEROL 1.25 MG (50000 UT) PO CAPS: 50000.0000 [IU] | ORAL_CAPSULE | ORAL | Status: DC

## 2015-02-10 NOTE — Progress Notes (Signed)
Order placed for f/u labs and rx sent in for prescription vitamin d.

## 2015-02-12 ENCOUNTER — Other Ambulatory Visit: Payer: Self-pay | Admitting: *Deleted

## 2015-02-12 MED ORDER — RIZATRIPTAN BENZOATE 10 MG PO TBDP: 10.0000 mg | ORAL_TABLET | ORAL | Status: DC | PRN

## 2015-04-15 ENCOUNTER — Other Ambulatory Visit: Payer: Self-pay

## 2015-04-15 ENCOUNTER — Other Ambulatory Visit (INDEPENDENT_AMBULATORY_CARE_PROVIDER_SITE_OTHER): Payer: 59

## 2015-04-15 DIAGNOSIS — E559 Vitamin D deficiency, unspecified: Secondary | ICD-10-CM

## 2015-04-15 DIAGNOSIS — D509 Iron deficiency anemia, unspecified: Secondary | ICD-10-CM | POA: Diagnosis not present

## 2015-04-16 LAB — CBC WITH DIFFERENTIAL/PLATELET
BASOS ABS: 0 10*3/uL (ref 0.0–0.1)
Basophils Relative: 0.3 % (ref 0.0–3.0)
EOS ABS: 0.2 10*3/uL (ref 0.0–0.7)
Eosinophils Relative: 1.5 % (ref 0.0–5.0)
HCT: 36.6 % (ref 36.0–46.0)
Hemoglobin: 11.2 g/dL — ABNORMAL LOW (ref 12.0–15.0)
LYMPHS ABS: 2.3 10*3/uL (ref 0.7–4.0)
LYMPHS PCT: 22 % (ref 12.0–46.0)
MCHC: 30.5 g/dL (ref 30.0–36.0)
MCV: 72.2 fl — ABNORMAL LOW (ref 78.0–100.0)
MONOS PCT: 3.4 % (ref 3.0–12.0)
Monocytes Absolute: 0.3 10*3/uL (ref 0.1–1.0)
NEUTROS ABS: 7.5 10*3/uL (ref 1.4–7.7)
NEUTROS PCT: 72.8 % (ref 43.0–77.0)
PLATELETS: 402 10*3/uL — AB (ref 150.0–400.0)
RBC: 5.07 Mil/uL (ref 3.87–5.11)
RDW: 18.6 % — ABNORMAL HIGH (ref 11.5–15.5)
WBC: 10.2 10*3/uL (ref 4.0–10.5)

## 2015-04-16 LAB — VITAMIN D 25 HYDROXY (VIT D DEFICIENCY, FRACTURES): VITD: 22.12 ng/mL — AB (ref 30.00–100.00)

## 2015-04-16 LAB — FERRITIN: Ferritin: 5.7 ng/mL — ABNORMAL LOW (ref 10.0–291.0)

## 2015-04-17 ENCOUNTER — Encounter: Payer: Self-pay | Admitting: Internal Medicine

## 2015-04-17 ENCOUNTER — Other Ambulatory Visit: Payer: Self-pay | Admitting: Internal Medicine

## 2015-04-17 DIAGNOSIS — D509 Iron deficiency anemia, unspecified: Secondary | ICD-10-CM

## 2015-04-17 NOTE — Progress Notes (Signed)
Orders placed for labs

## 2015-04-25 ENCOUNTER — Encounter: Payer: Self-pay | Admitting: Internal Medicine

## 2015-04-26 NOTE — Telephone Encounter (Signed)
Please place her on the schedule for Monday 04/29/15 - 4:30 - for back pain.  Thanks  Pt is aware of appt.  I just need her placed on the schedule.

## 2015-04-29 ENCOUNTER — Ambulatory Visit (INDEPENDENT_AMBULATORY_CARE_PROVIDER_SITE_OTHER): Payer: 59 | Admitting: Internal Medicine

## 2015-04-29 ENCOUNTER — Encounter: Payer: Self-pay | Admitting: Internal Medicine

## 2015-04-29 VITALS — BP 112/70 | HR 67 | Temp 97.9°F | Resp 18 | Ht 66.0 in | Wt 236.1 lb

## 2015-04-29 DIAGNOSIS — M545 Low back pain, unspecified: Secondary | ICD-10-CM

## 2015-04-29 DIAGNOSIS — K3189 Other diseases of stomach and duodenum: Secondary | ICD-10-CM

## 2015-04-29 MED ORDER — MELOXICAM 7.5 MG PO TABS: 7.5000 mg | ORAL_TABLET | Freq: Every day | ORAL | Status: DC

## 2015-04-29 NOTE — Patient Instructions (Signed)
Tylenol extra strength (or arthritis tylenol) - two tablets two times per day  Zantac (ranitidine) 150mg  - take one tablet 30 minutes before breakfast

## 2015-04-29 NOTE — Progress Notes (Signed)
Pre-visit discussion using our clinic review tool. No additional management support is needed unless otherwise documented below in the visit note.  

## 2015-04-30 ENCOUNTER — Ambulatory Visit (HOSPITAL_COMMUNITY)
Admission: RE | Admit: 2015-04-30 | Discharge: 2015-04-30 | Disposition: A | Discharge status: Home / Self Care | Payer: 59 | Source: Ambulatory Visit | Attending: Internal Medicine | Admitting: Internal Medicine

## 2015-04-30 ENCOUNTER — Encounter: Payer: Self-pay | Admitting: Internal Medicine

## 2015-04-30 DIAGNOSIS — M545 Low back pain, unspecified: Secondary | ICD-10-CM

## 2015-04-30 DIAGNOSIS — K3189 Other diseases of stomach and duodenum: Secondary | ICD-10-CM | POA: Insufficient documentation

## 2015-04-30 NOTE — Assessment & Plan Note (Signed)
Persistent low back pain as outlined.  Exam as outlined.  Will start mobic 7.5mg  q day.   Tylenol as directed.  Stop ibuprofen.  Check xray.  Further w/up pending results.

## 2015-04-30 NOTE — Assessment & Plan Note (Signed)
Minimal.  Add zantac.  Stop ibuprofen.  mobic with food.  Follow closely.

## 2015-04-30 NOTE — Progress Notes (Signed)
Patient ID: Tina Fleming, female   DOB: 05-28-1980, 34 y.o.   MRN: [id number]   Subjective:    Patient ID: Tina Fleming, female    DOB: 04/15/1981, 34 y.o.   MRN: [id number]  HPI  Patient with past history of anemia.  She comes in today as a work in with concerns regarding low back pain.  She has been having intermittent flares with her back for a while now.  May last a couple of days and then resolves.  Last week, was lifting a Christmas tree.  Increased low back pain since.  No pain radiating down her legs.  No numbness or tingling.  Increased stiffness and pain after she has been sitting and then gets up.  Increased pain with standing.  No pain when lying down.  Taking  ibuprofen bid.  Brother has anklyosing spondylitis.  Denies possibility of being pregnant.  Having period now and states "no way could be pregnant".     Past Medical History  Diagnosis Date  . Heart murmur     asymtomatic  . Anemia     iron suppliments in past-not currently  . Nephrolithiasis   . Migraine   . IBS (irritable bowel syndrome)   . Anemia   . Vitamin D deficiency   . Obesity    Past Surgical History  Procedure Laterality Date  . Urethral stricture dilatation      as a child  . Knee arthroscopy  04/24/2011    Procedure: ARTHROSCOPY KNEE;  Surgeon: Javier Docker;  Location: [facility];  Service: Orthopedics;  Laterality: Right;  /Arthroscopy with debridement, right knee  . Laparoscopic appendectomy  03/21/2012    Procedure: APPENDECTOMY LAPAROSCOPIC;  Surgeon: Clovis Pu. Cornett, MD;  Location: [facility];  Service: General;  Laterality: N/A;  . Appendectomy  03/21/12    laproscopic appy   Family History  Problem Relation Age of Onset  . Cancer Mother     pancreatic  . Arthritis Mother   . Hyperlipidemia Mother   . Hypertension Mother   . Cancer Father     skin  . Arthritis Father   . Hyperlipidemia Father   . Hypertension Father   . Diabetes Father   . Ankylosing  spondylitis Brother   . Spondylitis Brother    Social History   Social History  . Marital Status: Single    Spouse Name: N/A  . Number of Children: N/A  . Years of Education: N/A   Social History Main Topics  . Smoking status: Never Smoker   . Smokeless tobacco: Never Used  . Alcohol Use: No  . Drug Use: No  . Sexual Activity: Not Asked   Other Topics Concern  . None   Social History Narrative    Outpatient Encounter Prescriptions as of 04/29/2015  Medication Sig  . ergocalciferol (VITAMIN D2) 50000 UNITS capsule Take 1 capsule (50,000 Units total) by mouth once a week.  . Fe Fum-FePoly-Vit C-Vit B3 (INTEGRA) 62.5-62.5-40-3 MG CAPS One tablet per day  . rizatriptan (MAXALT-MLT) 10 MG disintegrating tablet Take 1 tablet (10 mg total) by mouth as needed for migraine. May repeat in 2 hours if needed  . meloxicam (MOBIC) 7.5 MG tablet Take 1 tablet (7.5 mg total) by mouth daily.   No facility-administered encounter medications on file as of 04/29/2015.    Review of Systems  Constitutional:       Denies possibility of being pregnant.    Gastrointestinal: Negative for abdominal pain.  No stomach irritation from taking the ibuprofen.    Genitourinary: Negative for dysuria and difficulty urinating.  Musculoskeletal: Positive for back pain.       No pain radiating down the legs.  No numbness or tingling.    Skin: Negative for color change and rash.  Neurological: Negative for dizziness and headaches.       Objective:    Physical Exam  Constitutional: She appears well-developed and well-nourished. No distress.  Cardiovascular: Normal rate and regular rhythm.   Pulmonary/Chest: Breath sounds normal. No respiratory distress. She has no wheezes.  Abdominal: Soft. Bowel sounds are normal. There is no tenderness.  Musculoskeletal: She exhibits no edema or tenderness.  No pain with lying down.  Some pain in the mid low back with going from a lying position to siting.  Some  increased pain with flexion at the waist.  Negative straight leg raise.    Skin: No rash noted. No erythema.    BP 112/70 mmHg  Pulse 67  Temp(Src) 97.9 F (36.6 C) (Oral)  Resp 18  Ht 5\' 6"  (1.676 m)  Wt 236 lb 2 oz (107.106 kg)  BMI 38.13 kg/m2  SpO2 93%  LMP 04/27/2015 (Exact Date) Wt Readings from Last 3 Encounters:  04/29/15 236 lb 2 oz (107.106 kg)  02/08/15 229 lb (103.874 kg)  08/06/14 239 lb 6 oz (108.58 kg)     Lab Results  Component Value Date   WBC 10.2 04/15/2015   HGB 11.2* 04/15/2015   HCT 36.6 04/15/2015   PLT 402.0* 04/15/2015   GLUCOSE 79 02/08/2015   CHOL 180 02/08/2015   TRIG 115.0 02/08/2015   HDL 46.40 02/08/2015   LDLCALC 111* 02/08/2015   ALT 20 02/08/2015   AST 19 02/08/2015   NA 139 02/08/2015   K 4.7 02/08/2015   CL 105 02/08/2015   CREATININE 0.72 02/08/2015   BUN 12 02/08/2015   CO2 27 02/08/2015   TSH 1.57 02/08/2015       Assessment & Plan:   Problem List Items Addressed This Visit    Low back pain - Primary    Persistent low back pain as outlined.  Exam as outlined.  Will start mobic 7.5mg  q day.   Tylenol as directed.  Stop ibuprofen.  Check xray.  Further w/up pending results.        Relevant Medications   meloxicam (MOBIC) 7.5 MG tablet   Other Relevant Orders   DG Lumbar Spine 2-3 Views   Stomach irritation    Minimal.  Add zantac.  Stop ibuprofen.  mobic with food.  Follow closely.           Dale DurhamSCOTT, Jeanett Antonopoulos, MD

## 2015-05-01 ENCOUNTER — Encounter: Payer: Self-pay | Admitting: Internal Medicine

## 2015-05-17 ENCOUNTER — Other Ambulatory Visit: Payer: Self-pay

## 2015-06-04 ENCOUNTER — Other Ambulatory Visit: Payer: Self-pay

## 2015-10-31 ENCOUNTER — Encounter: Payer: Self-pay | Admitting: Internal Medicine

## 2015-12-12 ENCOUNTER — Encounter: Payer: Self-pay | Admitting: Internal Medicine

## 2015-12-12 DIAGNOSIS — N939 Abnormal uterine and vaginal bleeding, unspecified: Secondary | ICD-10-CM

## 2015-12-15 NOTE — Telephone Encounter (Signed)
Order placed for gyn referral.  Pt notified via my chart.   

## 2015-12-25 ENCOUNTER — Other Ambulatory Visit: Payer: Self-pay

## 2015-12-25 ENCOUNTER — Encounter: Payer: Self-pay | Admitting: Internal Medicine

## 2015-12-25 DIAGNOSIS — E559 Vitamin D deficiency, unspecified: Secondary | ICD-10-CM

## 2015-12-25 NOTE — Telephone Encounter (Signed)
Order placed for labs.  Also needs cbc and ferritin.  Already scheduled.

## 2015-12-26 ENCOUNTER — Other Ambulatory Visit (INDEPENDENT_AMBULATORY_CARE_PROVIDER_SITE_OTHER): Payer: 59

## 2015-12-26 DIAGNOSIS — D509 Iron deficiency anemia, unspecified: Secondary | ICD-10-CM

## 2015-12-26 DIAGNOSIS — E559 Vitamin D deficiency, unspecified: Secondary | ICD-10-CM

## 2015-12-26 LAB — CBC WITH DIFFERENTIAL/PLATELET
BASOS ABS: 0.1 10*3/uL (ref 0.0–0.1)
Basophils Relative: 0.8 % (ref 0.0–3.0)
EOS ABS: 0.1 10*3/uL (ref 0.0–0.7)
Eosinophils Relative: 1.6 % (ref 0.0–5.0)
HCT: 31.4 % — ABNORMAL LOW (ref 36.0–46.0)
HEMOGLOBIN: 9.9 g/dL — AB (ref 12.0–15.0)
LYMPHS ABS: 1.9 10*3/uL (ref 0.7–4.0)
Lymphocytes Relative: 27.6 % (ref 12.0–46.0)
MCHC: 31.4 g/dL (ref 30.0–36.0)
MCV: 71.1 fl — ABNORMAL LOW (ref 78.0–100.0)
MONO ABS: 0.4 10*3/uL (ref 0.1–1.0)
Monocytes Relative: 5.7 % (ref 3.0–12.0)
NEUTROS PCT: 64.3 % (ref 43.0–77.0)
Neutro Abs: 4.4 10*3/uL (ref 1.4–7.7)
Platelets: 434 10*3/uL — ABNORMAL HIGH (ref 150.0–400.0)
RBC: 4.42 Mil/uL (ref 3.87–5.11)
RDW: 19 % — ABNORMAL HIGH (ref 11.5–15.5)
WBC: 6.9 10*3/uL (ref 4.0–10.5)

## 2015-12-26 LAB — VITAMIN D 25 HYDROXY (VIT D DEFICIENCY, FRACTURES): VITD: 22.72 ng/mL — AB (ref 30.00–100.00)

## 2015-12-26 LAB — FERRITIN: FERRITIN: 4.4 ng/mL — AB (ref 10.0–291.0)

## 2015-12-27 ENCOUNTER — Encounter: Payer: Self-pay | Admitting: Internal Medicine

## 2015-12-30 ENCOUNTER — Other Ambulatory Visit: Payer: Self-pay | Admitting: Internal Medicine

## 2015-12-30 MED FILL — VIT D2 1.25 MG (50,000 UNIT: 1.25 MG | 28 days supply | Qty: 4 | Fill #2

## 2015-12-30 MED FILL — INTEGRA CAPSULE: 62.5-62.5-4 | 30 days supply | Qty: 30 | Fill #0

## 2016-01-01 ENCOUNTER — Encounter (HOSPITAL_COMMUNITY): Payer: Self-pay

## 2016-01-01 ENCOUNTER — Inpatient Hospital Stay (HOSPITAL_COMMUNITY)
Admission: AD | Admit: 2016-01-01 | Discharge: 2016-01-01 | Disposition: A | Discharge status: Home / Self Care | Payer: 59 | Attending: Obstetrics & Gynecology | Admitting: Obstetrics & Gynecology

## 2016-01-01 DIAGNOSIS — K589 Irritable bowel syndrome without diarrhea: Secondary | ICD-10-CM | POA: Insufficient documentation

## 2016-01-01 DIAGNOSIS — Z8249 Family history of ischemic heart disease and other diseases of the circulatory system: Secondary | ICD-10-CM | POA: Insufficient documentation

## 2016-01-01 DIAGNOSIS — R42 Dizziness and giddiness: Secondary | ICD-10-CM | POA: Diagnosis not present

## 2016-01-01 DIAGNOSIS — Z79899 Other long term (current) drug therapy: Secondary | ICD-10-CM | POA: Diagnosis not present

## 2016-01-01 DIAGNOSIS — Z833 Family history of diabetes mellitus: Secondary | ICD-10-CM | POA: Insufficient documentation

## 2016-01-01 DIAGNOSIS — D500 Iron deficiency anemia secondary to blood loss (chronic): Secondary | ICD-10-CM | POA: Diagnosis not present

## 2016-01-01 DIAGNOSIS — Z882 Allergy status to sulfonamides status: Secondary | ICD-10-CM | POA: Diagnosis not present

## 2016-01-01 DIAGNOSIS — E559 Vitamin D deficiency, unspecified: Secondary | ICD-10-CM | POA: Diagnosis not present

## 2016-01-01 DIAGNOSIS — N939 Abnormal uterine and vaginal bleeding, unspecified: Secondary | ICD-10-CM | POA: Insufficient documentation

## 2016-01-01 LAB — URINE MICROSCOPIC-ADD ON
SQUAMOUS EPITHELIAL / LPF: NONE SEEN
WBC UA: NONE SEEN WBC/hpf (ref 0–5)

## 2016-01-01 LAB — CBC
HCT: 28.1 % — ABNORMAL LOW (ref 36.0–46.0)
Hemoglobin: 8.4 g/dL — ABNORMAL LOW (ref 12.0–15.0)
MCH: 21.6 pg — AB (ref 26.0–34.0)
MCHC: 29.9 g/dL — ABNORMAL LOW (ref 30.0–36.0)
MCV: 72.2 fL — AB (ref 78.0–100.0)
PLATELETS: 430 10*3/uL — AB (ref 150–400)
RBC: 3.89 MIL/uL (ref 3.87–5.11)
RDW: 17.6 % — AB (ref 11.5–15.5)
WBC: 8.6 10*3/uL (ref 4.0–10.5)

## 2016-01-01 LAB — URINALYSIS, ROUTINE W REFLEX MICROSCOPIC
BILIRUBIN URINE: NEGATIVE
Glucose, UA: NEGATIVE mg/dL
KETONES UR: NEGATIVE mg/dL
Leukocytes, UA: NEGATIVE
NITRITE: NEGATIVE
PH: 5.5 (ref 5.0–8.0)
Protein, ur: NEGATIVE mg/dL
SPECIFIC GRAVITY, URINE: 1.015 (ref 1.005–1.030)

## 2016-01-01 LAB — POCT PREGNANCY, URINE: Preg Test, Ur: NEGATIVE

## 2016-01-01 MED ORDER — MEGESTROL ACETATE 20 MG PO TABS: 40.0000 mg | ORAL_TABLET | Freq: Two times a day (BID) | ORAL | 0 refills | Status: DC

## 2016-01-01 MED FILL — MEGESTROL 40 MG TABLET: 40 | 40 days supply | Qty: 80 | Fill #0

## 2016-01-01 NOTE — Discharge Instructions (Signed)
Abnormal Uterine Bleeding °Abnormal uterine bleeding means bleeding from the vagina that is not your normal menstrual period. This can be: °· Bleeding or spotting between periods. °· Bleeding after sex (sexual intercourse). °· Bleeding that is heavier or more than normal. °· Periods that last longer than usual. °· Bleeding after menopause. °There are many problems that may cause this. Treatment will depend on the cause of the bleeding. Any kind of bleeding that is not normal should be reviewed by your doctor.  °HOME CARE °Watch your condition for any changes. These actions may lessen any discomfort you are having: °· Do not use tampons or douches as told by your doctor. °· Change your pads often. °You should get regular pelvic exams and Pap tests. Keep all appointments for tests as told by your doctor. °GET HELP IF: °· You are bleeding for more than 1 week. °· You feel dizzy at times. °GET HELP RIGHT AWAY IF:  °· You pass out. °· You have to change pads every 15 to 30 minutes. °· You have belly pain. °· You have a fever. °· You become sweaty or weak. °· You are passing large blood clots from the vagina. °· You feel sick to your stomach (nauseous) and throw up (vomit). °MAKE SURE YOU: °· Understand these instructions. °· Will watch your condition. °· Will get help right away if you are not doing well or get worse. °  °This information is not intended to replace advice given to you by your health care provider. Make sure you discuss any questions you have with your health care provider. °  °Document Released: 03/08/2009 Document Revised: 05/16/2013 Document Reviewed: 12/08/2012 °Elsevier Interactive Patient Education ©2016 Elsevier Inc. ° °

## 2016-01-01 NOTE — MAU Note (Signed)
Pt complaints of irregular periods and has had steady bleeding since July 12. Has had golf ball sized clots but reports having this with her regular periods. Denies any pain. Pt started feeling dizzy today around 1000, Denies loosing balance due to them. Has had intermittant headaches.

## 2016-01-01 NOTE — MAU Provider Note (Signed)
History     CSN: [id number]  Arrival date and time: 01/01/16 1239   First Provider Initiated Contact with Patient 01/01/16 1319      Chief Complaint  Patient presents with  . Vaginal Bleeding  . Dizziness   HPI Ms. Tina Fleming is a 35 y.o. who presents to MAU today with complaint of vaginal bleeding and dizziness. The patient states a history of irregular periods, however never an episode of bleeding that lasted this long. She states vaginal bleeding daily with clots since 12/04/15. She denies abdominal pain with the bleeding although she usually has cramping with bleeding. She states that she has been referred to GYN in [place], but does not have an appointment until next week. She states that she came in today because she felt really dizzy and fatigued this morning and though she might pass out. She denies LOC, weakness or SOB. She has used 2 tampons and an overnight pad today.   OB History    No data available      Past Medical History:  Diagnosis Date  . Anemia    iron suppliments in past-not currently  . Anemia   . Heart murmur    asymtomatic  . IBS (irritable bowel syndrome)   . Migraine   . Nephrolithiasis   . Obesity   . Vitamin D deficiency     Past Surgical History:  Procedure Laterality Date  . APPENDECTOMY  03/21/12   laproscopic appy  . KNEE ARTHROSCOPY  04/24/2011   Procedure: ARTHROSCOPY KNEE;  Surgeon: Javier DockerJeffrey C Beane;  [facility];  Service: Orthopedics;  Laterality: Right;  /Arthroscopy with debridement, right knee  . LAPAROSCOPIC APPENDECTOMY  03/21/2012   Procedure: APPENDECTOMY LAPAROSCOPIC;  Surgeon: Clovis Puhomas A. Cornett, MD;  Location: [facility];  Service: General;  Laterality: N/A;  . URETHRAL STRICTURE DILATATION     as a child    Family History  Problem Relation Age of Onset  . Cancer Mother     pancreatic  . Arthritis Mother   . Hyperlipidemia Mother   . Hypertension Mother   . Cancer Father     skin  .  Arthritis Father   . Hyperlipidemia Father   . Hypertension Father   . Diabetes Father   . Ankylosing spondylitis Brother   . Spondylitis Brother     Social History  Substance Use Topics  . Smoking status: Never Smoker  . Smokeless tobacco: Never Used  . Alcohol use No    Allergies:  Allergies  Allergen Reactions  . Sulfa Antibiotics Rash    Prescriptions Prior to Admission  Medication Sig Dispense Refill Last Dose  . ergocalciferol (VITAMIN D2) 50000 UNITS capsule Take 1 capsule (50,000 Units total) by mouth once a week. 4 capsule 2 Taking  . Fe Fum-FePoly-Vit C-Vit B3 (INTEGRA) 62.5-62.5-40-3 MG CAPS TAKE 1 CAPSULE BY MOUTH ONCE DAILY 30 capsule 2   . meloxicam (MOBIC) 7.5 MG tablet Take 1 tablet (7.5 mg total) by mouth daily. 20 tablet 0   . rizatriptan (MAXALT-MLT) 10 MG disintegrating tablet Take 1 tablet (10 mg total) by mouth as needed for migraine. May repeat in 2 hours if needed 10 tablet 0 Taking    Review of Systems  Constitutional: Positive for malaise/fatigue. Negative for fever.  Respiratory: Negative for shortness of breath.   Gastrointestinal: Negative for abdominal pain.  Genitourinary: Negative for dysuria, frequency and urgency.       + vaginal bleeding  Neurological: Positive for dizziness  and headaches. Negative for loss of consciousness and weakness.   Physical Exam   Blood pressure 129/69, pulse 68, temperature 98 F (36.7 C), temperature source Oral, resp. rate 18, height  (1.651 m), weight 230 lb (104.3 kg), last menstrual period 12/04/2015.  Physical Exam  Nursing note and vitals reviewed. Constitutional: She is oriented to person, place, and time. She appears well-developed and well-nourished. No distress.  HENT:  Head: Normocephalic and atraumatic.  Cardiovascular: Normal rate.   Respiratory: Effort normal.  GI: Soft. She exhibits no distension and no mass. There is no tenderness. There is no rebound and no guarding.  Genitourinary:  Uterus is not enlarged (exam limited by patient body habitus) and not tender. Cervix exhibits no motion tenderness, no discharge and no friability. Right adnexum displays no mass and no tenderness. Left adnexum displays no mass and no tenderness. There is bleeding (small amount of blood pooling in the vaginal vault, no clots) in the vagina. No vaginal discharge found.  Neurological: She is alert and oriented to person, place, and time.  Skin: Skin is warm and dry. No erythema.  Psychiatric: She has a normal mood and affect.    Results for orders placed or performed during the hospital encounter of 01/01/16 (from the past 24 hour(s))  CBC     Status: Abnormal   Collection Time: 01/01/16  1:14 PM  Result Value Ref Range   WBC 8.6 4.0 - 10.5 K/uL   RBC 3.89 3.87 - 5.11 MIL/uL   Hemoglobin 8.4 (L) 12.0 - 15.0 g/dL   HCT 09.8 (L) 11.9 - 14.7 %   MCV 72.2 (L) 78.0 - 100.0 fL   MCH 21.6 (L) 26.0 - 34.0 pg   MCHC 29.9 (L) 30.0 - 36.0 g/dL   RDW 82.9 (H) 56.2 - 13.0 %   Platelets 430 (H) 150 - 400 K/uL  Pregnancy, urine POC     Status: None   Collection Time: 01/01/16  1:22 PM  Result Value Ref Range   Preg Test, Ur NEGATIVE NEGATIVE   Orthostatic VS for the past 24 hrs:  BP- Lying Pulse- Lying BP- Sitting Pulse- Sitting BP- Standing at 0 minutes Pulse- Standing at 0 minutes  01/01/16 1100 138/65 75 144/79 77 138/79 85     MAU Course  Procedures None  MDM UPT- negative UA and CBC today  Orthostatic VS without evidence of orthostatic changes VSS, patient is not bleeding heavily currently. Hgb stable.  Patient discharged in stable condition to complete AUB work-up as outpatient. Will start on Megace today and continue PO iron as previously prescribed.   Assessment and Plan  A: Abnormal uterine bleeding  Anemia of chronic blood loss  P: Discharge home Rx for Megace given to patient Advised to continue BID Iron supplements Bleeding precautions discussed Patient advised to  follow-up with GYN in [place] as planned for further work-up of AUB Patient may return to MAU as needed or if her condition were to change or worsen  Marny Lowenstein, PA-C  01/01/2016, 2:03 PM

## 2016-01-02 MED ORDER — RIZATRIPTAN BENZOATE 10 MG PO TBDP: 10.0000 mg | ORAL_TABLET | ORAL | 0 refills | Status: DC | PRN

## 2016-01-02 MED FILL — RIZATRIPTAN 10 MG ODT: 10 | 30 days supply | Qty: 9 | Fill #0

## 2016-01-02 NOTE — Telephone Encounter (Signed)
rx sent in for maxalt #10 with no refills.

## 2016-01-08 ENCOUNTER — Telehealth: Payer: Self-pay | Admitting: Internal Medicine

## 2016-01-08 DIAGNOSIS — Z1329 Encounter for screening for other suspected endocrine disorder: Secondary | ICD-10-CM | POA: Diagnosis not present

## 2016-01-08 DIAGNOSIS — N939 Abnormal uterine and vaginal bleeding, unspecified: Secondary | ICD-10-CM | POA: Diagnosis not present

## 2016-01-08 DIAGNOSIS — N924 Excessive bleeding in the premenopausal period: Secondary | ICD-10-CM

## 2016-01-08 LAB — CBC AND DIFFERENTIAL
HEMATOCRIT: 30 % — AB (ref 36–46)
HEMOGLOBIN: 9 g/dL — AB (ref 12.0–16.0)
PLATELETS: 450 10*3/uL — AB (ref 150–399)
WBC: 8.3 10^3/mL

## 2016-01-08 LAB — TSH: TSH: 3.01 u[IU]/mL (ref 0.41–5.90)

## 2016-01-08 NOTE — Telephone Encounter (Signed)
Called pt and discussed her visit.  Discussed referral to another gyn.  She is in agreement.  Wants to be seen in [place].  Is gaining weight with megace.  Wants to stop. Will stop and monitor bleeding.  Order placed for referral.

## 2016-01-31 DIAGNOSIS — N938 Other specified abnormal uterine and vaginal bleeding: Secondary | ICD-10-CM | POA: Diagnosis not present

## 2016-01-31 DIAGNOSIS — Z01419 Encounter for gynecological examination (general) (routine) without abnormal findings: Secondary | ICD-10-CM | POA: Diagnosis not present

## 2016-01-31 DIAGNOSIS — N840 Polyp of corpus uteri: Secondary | ICD-10-CM | POA: Diagnosis not present

## 2016-01-31 MED FILL — HEATHER TABLET: 0.35 | 28 days supply | Qty: 28 | Fill #0

## 2016-02-04 ENCOUNTER — Encounter: Payer: Self-pay | Admitting: Internal Medicine

## 2016-02-07 DIAGNOSIS — Z7689 Persons encountering health services in other specified circumstances: Secondary | ICD-10-CM

## 2016-02-09 ENCOUNTER — Encounter: Payer: Self-pay | Admitting: Internal Medicine

## 2016-02-11 ENCOUNTER — Encounter: Payer: Self-pay | Admitting: Internal Medicine

## 2016-02-17 MED FILL — INTEGRA CAPSULE: 62.5-62.5-4 | 30 days supply | Qty: 30 | Fill #1

## 2016-02-19 ENCOUNTER — Telehealth: Payer: Self-pay | Admitting: Internal Medicine

## 2016-02-19 NOTE — Telephone Encounter (Signed)
Fyi - Pt called and stated that the people that fill out the FMLA papers filled them out incorrectly and that they will be sending them back to the office. Please call patient when they do arrive.  Thank you!  Call pt @ [phone]

## 2016-02-24 NOTE — Telephone Encounter (Signed)
Pt has requested an update on FMLA paperwork  for pt's migraine  Pt contact [phone]

## 2016-02-24 NOTE — Telephone Encounter (Signed)
Please advise 

## 2016-02-25 ENCOUNTER — Other Ambulatory Visit: Payer: Self-pay | Admitting: Obstetrics and Gynecology

## 2016-02-25 NOTE — Telephone Encounter (Signed)
I can add the migraine history to the paper work, but I need more information.  Also, let her know that I still may need to combine the migraine information with the bleeding and gyn issues because they ask for dates seen recently ,etc.  I have not seen her recently for migraines.  Also need to know how often the episodes (migraine) and duration.  Also, what medication does she take when occurs.  Just need a little more info from her to complete form.

## 2016-02-25 NOTE — Telephone Encounter (Signed)
Noted.  Will complete form.

## 2016-02-25 NOTE — Telephone Encounter (Signed)
Rizatriptan,patient states the migraines flare in the winter about 2 per months, they sometimes last up to 2-3 days, patient states rest helps the most.

## 2016-02-25 NOTE — Telephone Encounter (Signed)
Pt called back regarding FMLA papers. I see that Dr. Lorin PicketScott had some questions for the pt. Thank you!  Call pt @ [phone]

## 2016-03-09 NOTE — Patient Instructions (Signed)
Your procedure is scheduled on:  Friday, Oct. 27, 2017  [facility] at:  11:30 AM  Pick up the phone at the desk and dial [phone].  Call this number if you have problems the morning of surgery: [phone].  Remember: Do NOT eat food:  After Midnight Thursday, Oct. 26, 2017  Do NOT drink clear liquids after:  9:00 AM day of surgery  Take these medicines the morning of surgery with a SIP OF WATER:  None  Stop ALL herbal medications at this time   Do NOT wear jewelry (body piercing), metal hair clips/bobby pins, make-up, or nail polish. Do NOT wear lotions, powders, or perfumes.  You may wear deodorant. Do NOT shave for 48 hours prior to surgery. Do NOT bring valuables to the hospital. Contacts, dentures, or bridgework may not be worn into surgery.  Have a responsible adult drive you home and stay with you for 24 hours after your procedure

## 2016-03-11 ENCOUNTER — Encounter (HOSPITAL_COMMUNITY): Payer: Self-pay

## 2016-03-11 ENCOUNTER — Encounter (HOSPITAL_COMMUNITY)
Admission: RE | Admit: 2016-03-11 | Discharge: 2016-03-11 | Disposition: A | Discharge status: Home / Self Care | Payer: 59 | Source: Ambulatory Visit | Attending: Obstetrics and Gynecology | Admitting: Obstetrics and Gynecology

## 2016-03-11 DIAGNOSIS — Z01812 Encounter for preprocedural laboratory examination: Secondary | ICD-10-CM | POA: Diagnosis not present

## 2016-03-11 HISTORY — DX: Nausea with vomiting, unspecified: R11.2

## 2016-03-11 HISTORY — DX: Abnormal electrocardiogram (ECG) (EKG): R94.31

## 2016-03-11 HISTORY — DX: Other specified postprocedural states: Z98.890

## 2016-03-11 HISTORY — DX: Personal history of urinary calculi: Z87.442

## 2016-03-11 LAB — CBC
HCT: 32.4 % — ABNORMAL LOW (ref 36.0–46.0)
HEMOGLOBIN: 9.5 g/dL — AB (ref 12.0–15.0)
MCH: 20 pg — AB (ref 26.0–34.0)
MCHC: 29.3 g/dL — AB (ref 30.0–36.0)
MCV: 68.2 fL — ABNORMAL LOW (ref 78.0–100.0)
Platelets: 455 10*3/uL — ABNORMAL HIGH (ref 150–400)
RBC: 4.75 MIL/uL (ref 3.87–5.11)
RDW: 17.4 % — AB (ref 11.5–15.5)
WBC: 7.2 10*3/uL (ref 4.0–10.5)

## 2016-03-20 ENCOUNTER — Encounter (HOSPITAL_COMMUNITY)
Admission: RE | Disposition: A | Discharge status: Home / Self Care | Payer: Self-pay | Source: Ambulatory Visit | Attending: Obstetrics and Gynecology

## 2016-03-20 ENCOUNTER — Ambulatory Visit (HOSPITAL_COMMUNITY)
Admission: RE | Admit: 2016-03-20 | Discharge: 2016-03-20 | Disposition: A | Discharge status: Home / Self Care | Payer: 59 | Source: Ambulatory Visit | Attending: Obstetrics and Gynecology | Admitting: Obstetrics and Gynecology

## 2016-03-20 ENCOUNTER — Encounter (HOSPITAL_COMMUNITY): Payer: Self-pay

## 2016-03-20 ENCOUNTER — Ambulatory Visit (HOSPITAL_COMMUNITY): Payer: 59 | Admitting: Anesthesiology

## 2016-03-20 DIAGNOSIS — D649 Anemia, unspecified: Secondary | ICD-10-CM | POA: Insufficient documentation

## 2016-03-20 DIAGNOSIS — R011 Cardiac murmur, unspecified: Secondary | ICD-10-CM | POA: Diagnosis not present

## 2016-03-20 DIAGNOSIS — Z833 Family history of diabetes mellitus: Secondary | ICD-10-CM | POA: Diagnosis not present

## 2016-03-20 DIAGNOSIS — Z8261 Family history of arthritis: Secondary | ICD-10-CM | POA: Diagnosis not present

## 2016-03-20 DIAGNOSIS — G43909 Migraine, unspecified, not intractable, without status migrainosus: Secondary | ICD-10-CM | POA: Insufficient documentation

## 2016-03-20 DIAGNOSIS — Z882 Allergy status to sulfonamides status: Secondary | ICD-10-CM | POA: Diagnosis not present

## 2016-03-20 DIAGNOSIS — N938 Other specified abnormal uterine and vaginal bleeding: Secondary | ICD-10-CM | POA: Diagnosis not present

## 2016-03-20 DIAGNOSIS — K589 Irritable bowel syndrome without diarrhea: Secondary | ICD-10-CM | POA: Diagnosis not present

## 2016-03-20 DIAGNOSIS — N840 Polyp of corpus uteri: Secondary | ICD-10-CM | POA: Diagnosis not present

## 2016-03-20 DIAGNOSIS — Z800 Family history of malignant neoplasm of digestive organs: Secondary | ICD-10-CM | POA: Insufficient documentation

## 2016-03-20 DIAGNOSIS — Z6841 Body Mass Index (BMI) 40.0 and over, adult: Secondary | ICD-10-CM | POA: Diagnosis not present

## 2016-03-20 DIAGNOSIS — Z8249 Family history of ischemic heart disease and other diseases of the circulatory system: Secondary | ICD-10-CM | POA: Insufficient documentation

## 2016-03-20 DIAGNOSIS — E559 Vitamin D deficiency, unspecified: Secondary | ICD-10-CM | POA: Diagnosis not present

## 2016-03-20 DIAGNOSIS — Z808 Family history of malignant neoplasm of other organs or systems: Secondary | ICD-10-CM | POA: Insufficient documentation

## 2016-03-20 HISTORY — PX: DILATATION & CURETTAGE/HYSTEROSCOPY WITH MYOSURE: SHX6511

## 2016-03-20 SURGERY — DILATATION & CURETTAGE/HYSTEROSCOPY WITH MYOSURE
Anesthesia: General

## 2016-03-20 MED ORDER — FENTANYL CITRATE (PF) 100 MCG/2ML IJ SOLN: 25.0000 ug | INTRAMUSCULAR | Status: DC | PRN

## 2016-03-20 MED ORDER — PROPOFOL 10 MG/ML IV BOLUS
INTRAVENOUS | Status: DC | PRN
  Administered 2016-03-20: 180 mg via INTRAVENOUS

## 2016-03-20 MED ORDER — SCOPOLAMINE 1 MG/3DAYS TD PT72
MEDICATED_PATCH | TRANSDERMAL | Status: AC
  Administered 2016-03-20: 1.5 mg via TRANSDERMAL
  Filled 2016-03-20: qty 1

## 2016-03-20 MED ORDER — KETOROLAC TROMETHAMINE 30 MG/ML IJ SOLN
INTRAMUSCULAR | Status: AC
  Filled 2016-03-20: qty 1

## 2016-03-20 MED ORDER — PROPOFOL 10 MG/ML IV BOLUS
INTRAVENOUS | Status: AC
  Filled 2016-03-20: qty 40

## 2016-03-20 MED ORDER — SCOPOLAMINE 1 MG/3DAYS TD PT72: 1.0000 | MEDICATED_PATCH | Freq: Once | TRANSDERMAL | Status: DC

## 2016-03-20 MED ORDER — ONDANSETRON HCL 4 MG/2ML IJ SOLN
INTRAMUSCULAR | Status: AC
  Filled 2016-03-20: qty 2

## 2016-03-20 MED ORDER — DEXAMETHASONE SODIUM PHOSPHATE 10 MG/ML IJ SOLN
INTRAMUSCULAR | Status: DC | PRN
  Administered 2016-03-20: 10 mg via INTRAVENOUS

## 2016-03-20 MED ORDER — GLYCOPYRROLATE 0.2 MG/ML IJ SOLN
INTRAMUSCULAR | Status: DC | PRN
  Administered 2016-03-20: 0.2 mg via INTRAVENOUS

## 2016-03-20 MED ORDER — SODIUM CHLORIDE 0.9 % IR SOLN
Status: DC | PRN
  Administered 2016-03-20: 3000 mL

## 2016-03-20 MED ORDER — SCOPOLAMINE 1 MG/3DAYS TD PT72
1.0000 | MEDICATED_PATCH | Freq: Once | TRANSDERMAL | Status: DC
  Administered 2016-03-20: 1.5 mg via TRANSDERMAL

## 2016-03-20 MED ORDER — FENTANYL CITRATE (PF) 100 MCG/2ML IJ SOLN: INTRAMUSCULAR | Status: DC | PRN

## 2016-03-20 MED ORDER — FENTANYL CITRATE (PF) 250 MCG/5ML IJ SOLN
INTRAMUSCULAR | Status: AC
  Filled 2016-03-20: qty 10

## 2016-03-20 MED ORDER — MIDAZOLAM HCL 2 MG/2ML IJ SOLN
INTRAMUSCULAR | Status: DC | PRN
  Administered 2016-03-20: 1.5 mg via INTRAVENOUS

## 2016-03-20 MED ORDER — LACTATED RINGERS IV SOLN: INTRAVENOUS | Status: DC

## 2016-03-20 MED ORDER — ONDANSETRON HCL 4 MG/2ML IJ SOLN
INTRAMUSCULAR | Status: DC | PRN
  Administered 2016-03-20: 4 mg via INTRAVENOUS

## 2016-03-20 MED ORDER — LACTATED RINGERS IV SOLN
INTRAVENOUS | Status: DC
  Administered 2016-03-20 (×2): via INTRAVENOUS

## 2016-03-20 MED ORDER — MIDAZOLAM HCL 2 MG/2ML IJ SOLN
INTRAMUSCULAR | Status: AC
  Filled 2016-03-20: qty 2

## 2016-03-20 MED ORDER — LIDOCAINE HCL (CARDIAC) 20 MG/ML IV SOLN
INTRAVENOUS | Status: DC | PRN
  Administered 2016-03-20: 30 mg via INTRAVENOUS

## 2016-03-20 MED ORDER — PROMETHAZINE HCL 25 MG/ML IJ SOLN: 6.2500 mg | INTRAMUSCULAR | Status: DC | PRN

## 2016-03-20 MED ORDER — FENTANYL CITRATE (PF) 100 MCG/2ML IJ SOLN
INTRAMUSCULAR | Status: DC | PRN
  Administered 2016-03-20 (×2): 50 ug via INTRAVENOUS

## 2016-03-20 MED ORDER — DEXAMETHASONE SODIUM PHOSPHATE 10 MG/ML IJ SOLN
INTRAMUSCULAR | Status: AC
  Filled 2016-03-20: qty 1

## 2016-03-20 MED ORDER — KETOROLAC TROMETHAMINE 30 MG/ML IJ SOLN
INTRAMUSCULAR | Status: DC | PRN
  Administered 2016-03-20: 30 mg via INTRAMUSCULAR
  Administered 2016-03-20: 30 mg via INTRAVENOUS

## 2016-03-20 MED ORDER — LIDOCAINE HCL (CARDIAC) 20 MG/ML IV SOLN
INTRAVENOUS | Status: AC
  Filled 2016-03-20: qty 5

## 2016-03-20 SURGICAL SUPPLY — 20 items
CANISTER SUCT 3000ML (MISCELLANEOUS) ×2 IMPLANT
CATH ROBINSON RED A/P 16FR (CATHETERS) ×2 IMPLANT
CLOTH BEACON ORANGE TIMEOUT ST (SAFETY) ×2 IMPLANT
CONTAINER PREFILL 10% NBF 60ML (FORM) ×4 IMPLANT
DEVICE MYOSURE LITE (MISCELLANEOUS) IMPLANT
DEVICE MYOSURE REACH (MISCELLANEOUS) IMPLANT
ELECT REM PT RETURN 9FT ADLT (ELECTROSURGICAL) ×2
ELECTRODE REM PT RTRN 9FT ADLT (ELECTROSURGICAL) ×1 IMPLANT
FILTER ARTHROSCOPY CONVERTOR (FILTER) ×2 IMPLANT
GLOVE BIOGEL PI IND STRL 7.0 (GLOVE) ×2 IMPLANT
GLOVE BIOGEL PI INDICATOR 7.0 (GLOVE) ×2
GLOVE ECLIPSE 6.5 STRL STRAW (GLOVE) ×2 IMPLANT
GOWN STRL REUS W/TWL LRG LVL3 (GOWN DISPOSABLE) ×4 IMPLANT
PACK VAGINAL MINOR WOMEN LF (CUSTOM PROCEDURE TRAY) ×2 IMPLANT
PAD OB MATERNITY 4.3X12.25 (PERSONAL CARE ITEMS) ×2 IMPLANT
SEAL ROD LENS SCOPE MYOSURE (ABLATOR) ×2 IMPLANT
TOWEL OR 17X24 6PK STRL BLUE (TOWEL DISPOSABLE) ×4 IMPLANT
TUBING AQUILEX INFLOW (TUBING) ×2 IMPLANT
TUBING AQUILEX OUTFLOW (TUBING) ×2 IMPLANT
WATER STERILE IRR 1000ML POUR (IV SOLUTION) ×2 IMPLANT

## 2016-03-20 NOTE — H&P (Signed)
Tina Fleming is an 35 y.o. female.SF presents for dx hysteroscopy, D&C due to endometrial polyp fragments noted on ebx done for DUB  Pertinent Gynecological History: Menses: irreg Bleeding: dysfunctional uterine bleeding Contraception: none DES exposure: denies Blood transfusions: none Sexually transmitted diseases: no past history Previous GYN Procedures: none  Last mammogram: n/a Date:n/a Last pap: normal Date:2017 OB History: G0   Menstrual History: Menarche age:n/a Patient's last menstrual period was 03/04/2016 (approximate).    Past Medical History:  Diagnosis Date  . Abnormal EKG    no cardiology follow  up was needed  . Anemia    iron suppliments in past-not currently  . Anemia   . Heart murmur    asymtomatic  . History of kidney stones   . IBS (irritable bowel syndrome)   . Migraine   . Nephrolithiasis   . Obesity   . PONV (postoperative nausea and vomiting)    prolonged sedation  . Vitamin D deficiency     Past Surgical History:  Procedure Laterality Date  . APPENDECTOMY  03/21/12   laproscopic appy  . KNEE ARTHROSCOPY  04/24/2011   Procedure: ARTHROSCOPY KNEE;  Surgeon: Tina Fleming;  [facility];  Service: Orthopedics;  Laterality: Right;  /Arthroscopy with debridement, right knee  . LAPAROSCOPIC APPENDECTOMY  03/21/2012   Procedure: APPENDECTOMY LAPAROSCOPIC;  Surgeon: Tina Puhomas A. Cornett, MD;  Location: [facility];  Service: General;  Laterality: N/A;  . LITHOTRIPSY    . URETHRAL STRICTURE DILATATION     as a child    Family History  Problem Relation Age of Onset  . Cancer Mother     pancreatic  . Arthritis Mother   . Hyperlipidemia Mother   . Hypertension Mother   . Cancer Father     skin  . Arthritis Father   . Hyperlipidemia Father   . Hypertension Father   . Diabetes Father   . Ankylosing spondylitis Brother   . Spondylitis Brother     Social History:  reports that she has never smoked. She has never used  smokeless tobacco. She reports that she does not drink alcohol or use drugs.  Allergies:  Allergies  Allergen Reactions  . Sulfa Antibiotics Rash    No prescriptions prior to admission.    Review of Systems  All other systems reviewed and are negative.   Last menstrual period 03/04/2016. Physical Exam  Constitutional: She is oriented to person, place, and time. She appears well-developed and well-nourished.  HENT:  Head: Atraumatic.  Eyes: EOM are normal.  Cardiovascular: Normal rate and regular rhythm.   Respiratory: Breath sounds normal.  GI: Soft.  Genitourinary: Vagina normal and uterus normal.  Musculoskeletal: Normal range of motion.  Neurological: She is oriented to person, place, and time.  Skin: Skin is warm and dry.    No results found for this or any previous visit (from the past 24 hour(s)).  No results found.  Assessment/Plan: DUB Endometrial polyp P) dx hysteroscopy, D&C . Risk of surgery includes infection, bleeding, injury to surrounding organ structures, internal scar tissue, fluid overload and its risk. All ? answered  Tina Fleming A 03/20/2016, 6:17 AM

## 2016-03-20 NOTE — Brief Op Note (Signed)
03/20/2016  2:22 PM  PATIENT:  Tina Fleming  35 y.o. female  PRE-OPERATIVE DIAGNOSIS:  Dysfunctional Uterine Bleeding, Endometrial Polyp  POST-OPERATIVE DIAGNOSIS:  Dysfunctional Uterine Bleeding, Endometrial Polyp  PROCEDURE:  Diagnostic hysteroscopy, D&C, hysteroscopic resection of endometrial polyps using myosure  SURGEON:  Surgeon(s) and Role:    * Maxie BetterSheronette Akai Dollard, MD - Primary  PHYSICIAN ASSISTANT:   ASSISTANTS: none   ANESTHESIA:   general FINDINGS;nl tubal ostia, endometrial polyps, nl endocervical canal EBL:  Total I/O In: 1000 [I.V.:1000] Out: 160 [Urine:150; Blood:10]  BLOOD ADMINISTERED:none  DRAINS: none   LOCAL MEDICATIONS USED:  NONE  SPECIMEN:  Source of Specimen:  EMC with endom polyps  DISPOSITION OF SPECIMEN:  PATHOLOGY  COUNTS:  YES  TOURNIQUET:  * No tourniquets in log *  DICTATION: .Other Dictation: Dictation Number [id number]  PLAN OF CARE: Discharge to home after PACU  PATIENT DISPOSITION:  PACU - hemodynamically stable.   Delay start of Pharmacological VTE agent (>24hrs) due to surgical blood loss or risk of bleeding: no

## 2016-03-20 NOTE — Anesthesia Procedure Notes (Signed)
Procedure Name: LMA Insertion Date/Time: 03/20/2016 1:17 PM Performed by: Rica RecordsICKELTON, Berdina Cheever Pre-anesthesia Checklist: Patient identified, Emergency Drugs available, Suction available and Patient being monitored Patient Re-evaluated:Patient Re-evaluated prior to inductionOxygen Delivery Method: Circle system utilized Preoxygenation: Pre-oxygenation with 100% oxygen Intubation Type: IV induction LMA: LMA inserted LMA Size: 3.0

## 2016-03-20 NOTE — Transfer of Care (Signed)
Immediate Anesthesia Transfer of Care Note  Patient: Gregery NaJennifer N Briseno  Procedure(s) Performed: Procedure(s) with comments: DILATATION & CURETTAGE/HYSTEROSCOPY WITH MYOSURE (N/A) - 45 min. requested  Patient Location: PACU  Anesthesia Type:General  Level of Consciousness: awake, alert  and oriented  Airway & Oxygen Therapy: Patient Spontanous Breathing and Patient connected to nasal cannula oxygen  Post-op Assessment: Report given to RN and Post -op Vital signs reviewed and stable  Post vital signs: Reviewed and stable  Last Vitals:  Vitals:   03/20/16 1120  BP: 134/76  Pulse: 62  Resp: 16  Temp: 36.6 C    Last Pain:  Vitals:   03/20/16 1120  TempSrc: Oral      Patients Stated Pain Goal: 4 (03/20/16 1120)  Complications: No apparent anesthesia complications

## 2016-03-20 NOTE — Anesthesia Postprocedure Evaluation (Signed)
Anesthesia Post Note  Patient: Tina Fleming  Procedure(s) Performed: Procedure(s) (LRB): DILATATION & CURETTAGE/HYSTEROSCOPY WITH MYOSURE (N/A)  Patient location during evaluation: PACU Anesthesia Type: General Level of consciousness: awake and alert Pain management: pain level controlled Vital Signs Assessment: post-procedure vital signs reviewed and stable Respiratory status: spontaneous breathing, nonlabored ventilation, respiratory function stable and patient connected to nasal cannula oxygen Cardiovascular status: blood pressure returned to baseline and stable Postop Assessment: no signs of nausea or vomiting Anesthetic complications: no     Last Vitals:  Vitals:   03/20/16 1515 03/20/16 1530  BP: 121/70   Pulse: 77 78  Resp: 18 16  Temp:      Last Pain:  Vitals:   03/20/16 1120  TempSrc: Oral   Pain Goal: Patients Stated Pain Goal: 4 (03/20/16 1120)               Nicholes Hibler J

## 2016-03-20 NOTE — Anesthesia Preprocedure Evaluation (Signed)
Anesthesia Evaluation  Patient identified by MRN, date of birth, ID band Patient awake    Reviewed: Allergy & Precautions, NPO status , Patient's Chart, lab work & pertinent test results  History of Anesthesia Complications (+) PONV and history of anesthetic complications  Airway Mallampati: II  TM Distance: >3 FB Neck ROM: Full    Dental no notable dental hx.    Pulmonary neg pulmonary ROS,    Pulmonary exam normal breath sounds clear to auscultation       Cardiovascular negative cardio ROS Normal cardiovascular exam+ Valvular Problems/Murmurs  Rhythm:Regular Rate:Normal     Neuro/Psych  Headaches, negative psych ROS   GI/Hepatic negative GI ROS, Neg liver ROS,   Endo/Other  Morbid obesity  Renal/GU Renal disease  negative genitourinary   Musculoskeletal negative musculoskeletal ROS (+)   Abdominal (+) + obese,   Peds negative pediatric ROS (+)  Hematology  (+) anemia ,   Anesthesia Other Findings   Reproductive/Obstetrics negative OB ROS                             Anesthesia Physical Anesthesia Plan  ASA: III  Anesthesia Plan: General   Post-op Pain Management:    Induction: Intravenous  Airway Management Planned: LMA  Additional Equipment:   Intra-op Plan:   Post-operative Plan: Extubation in OR  Informed Consent: I have reviewed the patients History and Physical, chart, labs and discussed the procedure including the risks, benefits and alternatives for the proposed anesthesia with the patient or authorized representative who has indicated his/her understanding and acceptance.   Dental advisory given  Plan Discussed with: CRNA  Anesthesia Plan Comments:         Anesthesia Quick Evaluation

## 2016-03-20 NOTE — Discharge Instructions (Signed)
DISCHARGE INSTRUCTIONS: D&C / D&E °The following instructions have been prepared to help you care for yourself upon your return home. °  °Personal hygiene: °• Use sanitary pads for vaginal drainage, not tampons. °• Shower the day after your procedure. °• NO tub baths, pools or Jacuzzis for 2-3 weeks. °• Wipe front to back after using the bathroom. ° °Activity and limitations: °• Do NOT drive or operate any equipment for 24 hours. The effects of anesthesia are still present and drowsiness may result. °• Do NOT rest in bed all day. °• Walking is encouraged. °• Walk up and down stairs slowly. °• You may resume your normal activity in one to two days or as indicated by your physician. ° °Sexual activity: NO intercourse for at least 2 weeks after the procedure, or as indicated by your physician. ° °Diet: Eat a light meal as desired this evening. You may resume your usual diet tomorrow. ° °Return to work: You may resume your work activities in one to two days or as indicated by your doctor. ° °What to expect after your surgery: Expect to have vaginal bleeding/discharge for 2-3 days and spotting for up to 10 days. It is not unusual to have soreness for up to 1-2 weeks. You may have a slight burning sensation when you urinate for the first day. Mild cramps may continue for a couple of days. You may have a regular period in 2-6 weeks. ° °Call your doctor for any of the following: °• Excessive vaginal bleeding, saturating and changing one pad every hour. °• Inability to urinate 6 hours after discharge from hospital. °• Pain not relieved by pain medication. °• Fever of 100.4° F or greater. °• Unusual vaginal discharge or odor. ° ° Call for an appointment:  ° ° °Patient’s signature: ______________________ ° °Nurse’s signature ________________________ ° °Support person's signature_______________________ ° ° °CALL  IF TEMP>100.4, NOTHING PER VAGINA X 1 WK, CALL IF SOAKING A MAXI  PAD EVERY HOUR OR MORE FREQUENTLY °

## 2016-03-21 NOTE — Op Note (Signed)
[name]:  Tina Fleming, Tina Fleming             ACCOUNT NO.:  [id number]  MEDICAL RECORD NO.:  [id number]  LOCATION:  [facility]                          FACILITY:  [facility]  PHYSICIAN:  Maxie BetterSheronette Elner Seifert, M.D.DATE OF BIRTH:  12-Sep-1980  DATE OF PROCEDURE:  03/20/2016 DATE OF DISCHARGE:  03/20/2016                              OPERATIVE REPORT   PREOPERATIVE DIAGNOSES:  Dysfunctional uterine bleeding, endometrial polyp.  PROCEDURE:  Diagnostic hysteroscopy, hysteroscopic resection of endometrial polyps using MyoSure dilation and curettage.  POSTOPERATIVE DIAGNOSES:  Dysfunctional uterine bleeding, endometrial polyp.  ANESTHESIA:  General.  SURGEON:  Maxie BetterSheronette Michaelia Beilfuss, MD.  ASSISTANT:  None.  DESCRIPTION OF PROCEDURE:  Under adequate general anesthesia, the patient was placed in dorsal lithotomy position.  She was sterilely prepped and draped in usual fashion.  Bladder was catheterized with small amount of urine.  Examination under anesthesia revealed anteverted uterus.  No adnexal masses could be appreciated.  A Peterson vaginal speculum was placed in the vagina.  Single-tooth tenaculum was placed on the anterior lip of the cervix.  Initial attempt at dilating the cervix was unsuccessful, therefore Os Finder was utilized, which was able to traverse the internal os; however, continued use of the dilators were initially not able to dilate cervix.  Finally, the dilatation of internal os was brought up to the [facility] dilator.  The saline primed MyoSure hysteroscope was attempted to be introduced into the internal os.  The internal os could be seen, but due to a rut in the posterior aspect of the endocervical canal, we could not traverse the internal os at all with the MyoSure initially.  At that point, we switched to the regular small diagnostic hysteroscope, which allowed easily entry into the uterine cavity.  Both tubal ostia could be seen.  Polypoid lesions off the right lateral wall and some  smaller ones in the posterior and left lateral wall was noted.  At that point, the hysteroscope was removed. Again, attempted dilation occurred.  The MyoSure hysteroscope was finally introduced into the uterine cavity after removing the bivalve speculum first.  Once in the cavity, Lite classic Myosure resectoscope was introduced.  The polypoid lesions were all removed.  The endometrium was also resected.  All areas that need to be resected was done.  The hysteroscope was removed.  The cavity was gently curetted for small amount of tissue.  All instruments were then removed from the vagina.  SPECIMEN:  Labeled endometrial curettings with polyps were sent to Pathology.  ESTIMATED BLOOD LOSS:  Minimal.  FLUID DEFICIT:  About 680ml.  COMPLICATIONS:  None.  The patient tolerated the procedure well, was transferred to recovery in stable condition.     Maxie BetterSheronette Gurjot Brisco, M.D.     [place]/MEDQ  D:  03/20/2016  T:  03/21/2016  Job:  [id number]

## 2016-03-23 ENCOUNTER — Encounter (HOSPITAL_COMMUNITY): Payer: Self-pay | Admitting: Obstetrics and Gynecology

## 2016-07-07 MED FILL — INTEGRA CAPSULE: 62.5-62.5-4 | 30 days supply | Qty: 30 | Fill #2

## 2016-07-14 ENCOUNTER — Encounter: Payer: Self-pay | Admitting: Internal Medicine

## 2016-07-14 ENCOUNTER — Emergency Department (HOSPITAL_COMMUNITY): Payer: 59

## 2016-07-14 ENCOUNTER — Emergency Department (HOSPITAL_COMMUNITY)
Admission: EM | Admit: 2016-07-14 | Discharge: 2016-07-14 | Disposition: A | Discharge status: Home / Self Care | Payer: 59 | Attending: Physician Assistant | Admitting: Physician Assistant

## 2016-07-14 ENCOUNTER — Encounter (HOSPITAL_COMMUNITY): Payer: Self-pay

## 2016-07-14 ENCOUNTER — Ambulatory Visit (INDEPENDENT_AMBULATORY_CARE_PROVIDER_SITE_OTHER): Payer: 59 | Admitting: Internal Medicine

## 2016-07-14 DIAGNOSIS — R42 Dizziness and giddiness: Secondary | ICD-10-CM | POA: Insufficient documentation

## 2016-07-14 DIAGNOSIS — Z5181 Encounter for therapeutic drug level monitoring: Secondary | ICD-10-CM | POA: Insufficient documentation

## 2016-07-14 DIAGNOSIS — H8110 Benign paroxysmal vertigo, unspecified ear: Secondary | ICD-10-CM | POA: Diagnosis not present

## 2016-07-14 DIAGNOSIS — N938 Other specified abnormal uterine and vaginal bleeding: Secondary | ICD-10-CM

## 2016-07-14 DIAGNOSIS — Z7982 Long term (current) use of aspirin: Secondary | ICD-10-CM | POA: Diagnosis not present

## 2016-07-14 DIAGNOSIS — R51 Headache: Secondary | ICD-10-CM | POA: Diagnosis not present

## 2016-07-14 DIAGNOSIS — G43109 Migraine with aura, not intractable, without status migrainosus: Secondary | ICD-10-CM | POA: Diagnosis not present

## 2016-07-14 LAB — PROTIME-INR
INR: 1.09
Prothrombin Time: 14.1 seconds (ref 11.4–15.2)

## 2016-07-14 LAB — CBC
HCT: 39.1 % (ref 36.0–46.0)
HEMOGLOBIN: 11.3 g/dL — AB (ref 12.0–15.0)
MCH: 20.1 pg — AB (ref 26.0–34.0)
MCHC: 28.9 g/dL — AB (ref 30.0–36.0)
MCV: 69.6 fL — ABNORMAL LOW (ref 78.0–100.0)
Platelets: 476 10*3/uL — ABNORMAL HIGH (ref 150–400)
RBC: 5.62 MIL/uL — ABNORMAL HIGH (ref 3.87–5.11)
RDW: 19.7 % — ABNORMAL HIGH (ref 11.5–15.5)
WBC: 9.2 10*3/uL (ref 4.0–10.5)

## 2016-07-14 LAB — COMPREHENSIVE METABOLIC PANEL
ALK PHOS: 87 U/L (ref 38–126)
ALT: 23 U/L (ref 14–54)
ANION GAP: 12 (ref 5–15)
AST: 23 U/L (ref 15–41)
Albumin: 4.6 g/dL (ref 3.5–5.0)
BILIRUBIN TOTAL: 0.5 mg/dL (ref 0.3–1.2)
BUN: 10 mg/dL (ref 6–20)
CALCIUM: 10.2 mg/dL (ref 8.9–10.3)
CO2: 24 mmol/L (ref 22–32)
Chloride: 104 mmol/L (ref 101–111)
Creatinine, Ser: 0.77 mg/dL (ref 0.44–1.00)
Glucose, Bld: 100 mg/dL — ABNORMAL HIGH (ref 65–99)
Potassium: 4.3 mmol/L (ref 3.5–5.1)
Sodium: 140 mmol/L (ref 135–145)
TOTAL PROTEIN: 7.6 g/dL (ref 6.5–8.1)

## 2016-07-14 LAB — DIFFERENTIAL
BASOS PCT: 0 %
Basophils Absolute: 0 10*3/uL (ref 0.0–0.1)
EOS PCT: 1 %
Eosinophils Absolute: 0.1 10*3/uL (ref 0.0–0.7)
LYMPHS ABS: 1.7 10*3/uL (ref 0.7–4.0)
Lymphocytes Relative: 18 %
MONOS PCT: 4 %
Monocytes Absolute: 0.4 10*3/uL (ref 0.1–1.0)
NEUTROS ABS: 7 10*3/uL (ref 1.7–7.7)
NEUTROS PCT: 77 %

## 2016-07-14 LAB — I-STAT BETA HCG BLOOD, ED (MC, WL, AP ONLY)

## 2016-07-14 LAB — I-STAT TROPONIN, ED: Troponin i, poc: 0 ng/mL (ref 0.00–0.08)

## 2016-07-14 LAB — APTT: aPTT: 33 seconds (ref 24–36)

## 2016-07-14 MED ORDER — MECLIZINE HCL 25 MG PO TABS: 25.0000 mg | ORAL_TABLET | Freq: Three times a day (TID) | ORAL | 0 refills | Status: DC | PRN

## 2016-07-14 MED ORDER — MECLIZINE HCL 25 MG PO TABS
25.0000 mg | ORAL_TABLET | Freq: Once | ORAL | Status: AC
  Administered 2016-07-14: 25 mg via ORAL
  Filled 2016-07-14: qty 1

## 2016-07-14 MED ORDER — ONDANSETRON 4 MG PO TBDP: 4.0000 mg | ORAL_TABLET | Freq: Three times a day (TID) | ORAL | 0 refills | Status: DC | PRN

## 2016-07-14 MED ORDER — LORAZEPAM 1 MG PO TABS
1.0000 mg | ORAL_TABLET | Freq: Once | ORAL | Status: AC
  Administered 2016-07-14: 1 mg via ORAL
  Filled 2016-07-14: qty 1

## 2016-07-14 MED ORDER — ONDANSETRON HCL 4 MG PO TABS
4.0000 mg | ORAL_TABLET | Freq: Once | ORAL | Status: AC
  Administered 2016-07-14: 4 mg via ORAL
  Filled 2016-07-14: qty 1

## 2016-07-14 NOTE — Progress Notes (Signed)
Patient ID: Tina Fleming, female   DOB: 08-23-1980, 36 y.o.   MRN: [id number]   Subjective:    Patient ID: Tina Fleming, female    DOB: 19-Nov-1980, 36 y.o.   MRN: [id number]  HPI  Patient here for a scheduled follow up.  She had been having issues with DUB.  Is s/p D&C and this is better.  Now having regular periods.  No heavy bleeding.  Had been dong well until this am.  States she woke acutely at 2:30 this am.  Did not feel right.  Could not make out the numbers on her clock.  Lasted for a few minutes.  Vision is now normal.  She went to get up and was extremely dizzy.  Has been dizzy since.  No headache.  This does not feel anything Fleming her migraines or auras associated with her migraines.  She has not been sick.  No head trauma.  Has had vomiting with the dizziness.  Mother brought her in to the office.     Past Medical History:  Diagnosis Date  . Abnormal EKG    no cardiology follow  up was needed  . Anemia    iron suppliments in past-not currently  . Anemia   . Heart murmur    asymtomatic  . History of kidney stones   . IBS (irritable bowel syndrome)   . Migraine   . Nephrolithiasis   . Obesity   . PONV (postoperative nausea and vomiting)    prolonged sedation  . Vitamin D deficiency    Past Surgical History:  Procedure Laterality Date  . APPENDECTOMY  03/21/12   laproscopic appy  . DILATATION & CURETTAGE/HYSTEROSCOPY WITH MYOSURE N/A 03/20/2016   Procedure: DILATATION & CURETTAGE/HYSTEROSCOPY WITH MYOSURE;  Surgeon: Maxie Better, MD;  Location: [facility];  Service: Gynecology;  Laterality: N/A;  45 min. requested  . KNEE ARTHROSCOPY  04/24/2011   Procedure: ARTHROSCOPY KNEE;  Surgeon: Javier Docker;  Location: [facility];  Service: Orthopedics;  Laterality: Right;  /Arthroscopy with debridement, right knee  . LAPAROSCOPIC APPENDECTOMY  03/21/2012   Procedure: APPENDECTOMY LAPAROSCOPIC;  Surgeon: Clovis Pu. Cornett, MD;  Location: [facility];  Service:  General;  Laterality: N/A;  . LITHOTRIPSY    . URETHRAL STRICTURE DILATATION     as a child   Family History  Problem Relation Age of Onset  . Cancer Mother     pancreatic  . Arthritis Mother   . Hyperlipidemia Mother   . Hypertension Mother   . Cancer Father     skin  . Arthritis Father   . Hyperlipidemia Father   . Hypertension Father   . Diabetes Father   . Ankylosing spondylitis Brother   . Spondylitis Brother    Social History   Social History  . Marital status: Single    Spouse name: N/A  . Number of children: N/A  . Years of education: N/A   Social History Main Topics  . Smoking status: Never Smoker  . Smokeless tobacco: Never Used  . Alcohol use No  . Drug use: No  . Sexual activity: Not Currently    Birth control/ protection: Pill   Other Topics Concern  . None   Social History Narrative  . None    Outpatient Encounter Prescriptions as of 07/14/2016  Medication Sig  . aspirin-acetaminophen-caffeine (EXCEDRIN MIGRAINE) 250-250-65 MG tablet Take 1 tablet by mouth every 6 (six) hours as needed for headache or migraine.  . Cholecalciferol (VITAMIN  D PO) Take 1 capsule by mouth every morning.  . rizatriptan (MAXALT-MLT) 10 MG disintegrating tablet Take 1 tablet (10 mg total) by mouth as needed for migraine. May repeat in 2 hours if needed  . [DISCONTINUED] ergocalciferol (VITAMIN D2) 50000 UNITS capsule Take 1 capsule (50,000 Units total) by mouth once a week.  . [DISCONTINUED] norethindrone (HEATHER) 0.35 MG tablet Take 1 tablet by mouth daily.  . [DISCONTINUED] Fe Fum-FePoly-Vit C-Vit B3 (INTEGRA) 62.5-62.5-40-3 MG CAPS TAKE 1 CAPSULE BY MOUTH ONCE DAILY (Patient not taking: Reported on 07/14/2016)   No facility-administered encounter medications on file as of 07/14/2016.     Review of Systems  Constitutional: Negative for appetite change and unexpected weight change.  HENT: Negative for sinus pressure.        No increased congestion.    Respiratory:  Negative for cough, chest tightness and shortness of breath.   Cardiovascular: Negative for chest pain, palpitations and leg swelling.  Gastrointestinal: Positive for nausea and vomiting. Negative for abdominal pain and diarrhea.  Genitourinary: Negative for dysuria.       Periods better.  Regular.    Musculoskeletal: Negative for back pain and joint swelling.  Skin: Negative for color change and rash.  Neurological: Positive for dizziness. Negative for headaches.  Psychiatric/Behavioral: Negative for agitation and dysphoric mood.       Objective:     Blood pressure rechecked by me:  128/80 lying and 128/82 standing.    Physical Exam  Constitutional: She appears well-developed and well-nourished. No distress.  Does not appear to feel well.   HENT:  Nose: Nose normal.  Mouth/Throat: Oropharynx is clear and moist.  TMs without erythema.   Eyes:  Some horizontal nystagmus.   Neck: Neck supple. No thyromegaly present.  Cardiovascular: Normal rate and regular rhythm.   Pulmonary/Chest: Breath sounds normal. No respiratory distress. She has no wheezes.  Abdominal: Soft. Bowel sounds are normal. There is no tenderness.  Musculoskeletal: She exhibits no edema or tenderness.  Lymphadenopathy:    She has no cervical adenopathy.  Neurological:  Reproducible dizziness on exam with lying back and rotating head.    Skin: No rash noted. No erythema.    BP (!) 150/88 (BP Location: Left Arm, Patient Position: Sitting, Cuff Size: Large)   Pulse 69   Temp 98.6 F (37 C) (Oral)   Ht 5\' 5"  (1.651 m)   Wt 237 lb (107.5 kg)   LMP 07/10/2016   SpO2 99%   BMI 39.44 kg/m  Wt Readings from Last 3 Encounters:  07/14/16 237 lb (107.5 kg)  03/11/16 241 lb 6 oz (109.5 kg)  01/01/16 230 lb (104.3 kg)     Lab Results  Component Value Date   WBC 9.2 07/14/2016   HGB 11.3 (L) 07/14/2016   HCT 39.1 07/14/2016   PLT 476 (H) 07/14/2016   GLUCOSE 100 (H) 07/14/2016   CHOL 180 02/08/2015   TRIG  115.0 02/08/2015   HDL 46.40 02/08/2015   LDLCALC 111 (H) 02/08/2015   ALT 23 07/14/2016   AST 23 07/14/2016   NA 140 07/14/2016   K 4.3 07/14/2016   CL 104 07/14/2016   CREATININE 0.77 07/14/2016   BUN 10 07/14/2016   CO2 24 07/14/2016   TSH 3.01 01/08/2016   INR 1.09 07/14/2016       Assessment & Plan:   Problem List Items Addressed This Visit    Dizziness    Acute dizziness with visual disturbance.  Does not have a history  of inner ear.  Has a history of migraines.  This does not feel anything Fleming her symptoms/auras associated with her migraines.  Symptoms reproducible on exam.  Given acuity and given visual disturbance concern over possible acute neurologic event.  Discussed with neurology who agreed, ER evaluation warranted to r/u infarct,  etc.  Pt and mother notified and in agreement.        DUB (dysfunctional uterine bleeding)    S/p D&C.  Doing better.  Bleeding better.  Regular periods.  Follow.  Check cbc.       Migraine    Has a history of migraines.  Her current symptoms are not c/w her migraines or any previous auras.  Refer to ER for w/up as outlined.           Dale DurhamSCOTT, Mckaylah Bettendorf, MD

## 2016-07-14 NOTE — ED Provider Notes (Signed)
[facility] Provider Note   CSN: [id number] Arrival date & time: 07/14/16  1317     History   Chief Complaint Chief Complaint  Patient presents with  . dizziness, blurred vision    HPI Tina Fleming is a 36 y.o. female. With history of microcytic anemia that presents to the ED with Dizziness. She reports waking up at 2:30 this morning and feeling like the room was spinning. She states it is worse with head movement and bending forward.  She has associated symptoms of blurry vision and nausea/vomiting.  She denies any ear pain, tinnitus, fever/chills or any recent illnesses.  HPI   Past Medical History:  Diagnosis Date  . Abnormal EKG    no cardiology follow  up was needed  . Anemia    iron suppliments in past-not currently  . Anemia   . Heart murmur    asymtomatic  . History of kidney stones   . IBS (irritable bowel syndrome)   . Migraine   . Nephrolithiasis   . Obesity   . PONV (postoperative nausea and vomiting)    prolonged sedation  . Vitamin D deficiency     Patient Active Problem List   Diagnosis Date Noted  . Dizziness 07/14/2016  . Low back pain 04/30/2015  . Stomach irritation 04/30/2015  . Sore throat 02/09/2015  . Fatigue 02/08/2015  . Ear fullness 08/06/2014  . Skin lesion of breast 08/06/2014  . Health care maintenance 08/06/2014  . Acute bronchitis 05/06/2013  . Nephrolithiasis 02/11/2013  . Migraine 02/11/2013  . History of frequent urinary tract infections 02/11/2013  . Syncope 02/11/2013  . IBS (irritable bowel syndrome) 02/11/2013  . Anemia 02/11/2013  . Vitamin D deficiency 02/11/2013    Past Surgical History:  Procedure Laterality Date  . APPENDECTOMY  03/21/12   laproscopic appy  . DILATATION & CURETTAGE/HYSTEROSCOPY WITH MYOSURE N/A 03/20/2016   Procedure: DILATATION & CURETTAGE/HYSTEROSCOPY WITH MYOSURE;  Surgeon: Maxie Better, MD;  Location: [facility];  Service: Gynecology;  Laterality: N/A;  45 min. requested  .  KNEE ARTHROSCOPY  04/24/2011   Procedure: ARTHROSCOPY KNEE;  Surgeon: Javier Docker;  Location: [facility];  Service: Orthopedics;  Laterality: Right;  /Arthroscopy with debridement, right knee  . LAPAROSCOPIC APPENDECTOMY  03/21/2012   Procedure: APPENDECTOMY LAPAROSCOPIC;  Surgeon: Clovis Pu. Cornett, MD;  Location: [facility];  Service: General;  Laterality: N/A;  . LITHOTRIPSY    . URETHRAL STRICTURE DILATATION     as a child    OB History    No data available       Home Medications    Prior to Admission medications   Medication Sig Start Date End Date Taking? Authorizing Provider  aspirin-acetaminophen-caffeine (EXCEDRIN MIGRAINE) [phone] MG tablet Take 1 tablet by mouth every 6 (six) hours as needed for headache or migraine.    Historical Provider, MD  Cholecalciferol (VITAMIN D PO) Take 1 capsule by mouth every morning.    Historical Provider, MD  ergocalciferol (VITAMIN D2) 50000 UNITS capsule Take 1 capsule (50,000 Units total) by mouth once a week. 02/10/15   Dale Holyoke, MD  norethindrone (HEATHER) 0.35 MG tablet Take 1 tablet by mouth daily.    Historical Provider, MD  rizatriptan (MAXALT-MLT) 10 MG disintegrating tablet Take 1 tablet (10 mg total) by mouth as needed for migraine. May repeat in 2 hours if needed 01/02/16   Dale Ivalee, MD    Family History Family History  Problem Relation Age of Onset  .  Cancer Mother     pancreatic  . Arthritis Mother   . Hyperlipidemia Mother   . Hypertension Mother   . Cancer Father     skin  . Arthritis Father   . Hyperlipidemia Father   . Hypertension Father   . Diabetes Father   . Ankylosing spondylitis Brother   . Spondylitis Brother     Social History Social History  Substance Use Topics  . Smoking status: Never Smoker  . Smokeless tobacco: Never Used  . Alcohol use No     Allergies   Sulfa antibiotics   Review of Systems Review of Systems  Constitutional: Negative for fever.  HENT:  Negative for hearing loss and tinnitus.   Respiratory: Negative for shortness of breath.   Cardiovascular: Negative for chest pain.  Gastrointestinal: Negative for abdominal pain.  Musculoskeletal: Negative for neck pain.  Neurological: Positive for dizziness. Negative for weakness, numbness and headaches.     Physical Exam Updated Vital Signs BP 128/86   Pulse 83   Temp 98.8 F (37.1 C) (Oral)   Resp 25   LMP 07/10/2016   SpO2 100%   Physical Exam  Constitutional: She appears well-developed and well-nourished.  HENT:  Left and right ear with clear tympanic membranes  Eyes: Pupils are equal, round, and reactive to light.  Cardiovascular: Normal rate, regular rhythm and normal heart sounds.  Exam reveals no gallop and no friction rub.   No murmur heard. Pulmonary/Chest: Effort normal and breath sounds normal. No respiratory distress. She has no wheezes. She has no rales.  Abdominal: Soft. Bowel sounds are normal. She exhibits no distension and no mass. There is no tenderness. There is no guarding.  Musculoskeletal: She exhibits no edema.  Neurological:  CN II-XII intact Sensation intact bilaterally in upper and lower extremities bilaterally 5/5 motor strength in upper and lower extremities bilaterally Finger to nose exam intact     ED Treatments / Results  Labs (all labs ordered are listed, but only abnormal results are displayed) Labs Reviewed  CBC - Abnormal; Notable for the following:       Result Value   RBC 5.62 (*)    Hemoglobin 11.3 (*)    MCV 69.6 (*)    MCH 20.1 (*)    MCHC 28.9 (*)    RDW 19.7 (*)    Platelets 476 (*)    All other components within normal limits  COMPREHENSIVE METABOLIC PANEL - Abnormal; Notable for the following:    Glucose, Bld 100 (*)    All other components within normal limits  PROTIME-INR  APTT  DIFFERENTIAL  I-STAT TROPOININ, ED  I-STAT BETA HCG BLOOD, ED ([facility], [facility], [facility] ONLY)    EKG  EKG Interpretation  Date/Time:  Tuesday  July 14 2016 13:22:19 EST Ventricular Rate:  77 PR Interval:  164 QRS Duration: 92 QT Interval:  408 QTC Calculation: 461 R Axis:   15 Text Interpretation:  Normal sinus rhythm Cannot rule out Anterior infarct , age undetermined Abnormal ECG Confirmed by [facility] MD, Barbara Cower [phone]) on 07/14/2016 2:35:22 PM       Radiology No results found.  Procedures Procedures (including critical care time)  Medications Ordered in ED Medications  meclizine (ANTIVERT) tablet 25 mg (not administered)     Initial Impression / Assessment and Plan / ED Course  I have reviewed the triage vital signs and the nursing notes.  Pertinent labs & imaging results that were available during my care of the patient were reviewed by  me and considered in my medical decision making (see chart for details).   Patient presents to the ED with vertigo symptoms consistent with BPPV.  Gave patient meclizine and zofran with slight improvement of symptoms.  Patient was given ativan at shift change.  Patient was signed out and follow up care was provided by Dr. Charm BargesButler.   Final Clinical Impressions(s) / ED Diagnoses   Final diagnoses:  None    New Prescriptions New Prescriptions   No medications on file     Camelia PhenesJessica Ratliff Aftin Lye, DO 07/15/16 2015    Marily MemosJason Mesner, MD 07/16/16 [phone]

## 2016-07-14 NOTE — Assessment & Plan Note (Addendum)
Acute dizziness with visual disturbance.  Does not have a history of inner ear.  Has a history of migraines.  This does not feel anything like her symptoms/auras associated with her migraines.  Symptoms reproducible on exam.  Given acuity and given visual disturbance concern over possible acute neurologic event.  Discussed with neurology who agreed, ER evaluation warranted to r/u infarct,  etc.  Pt and mother notified and in agreement.

## 2016-07-14 NOTE — ED Notes (Signed)
Care handoff to Casey RN 

## 2016-07-14 NOTE — Discharge Instructions (Addendum)
Ms. Galen ManilaRimmer,  Please take meclizine as needed for vertigo symptoms.  Please take zofran as needed for nausea/vomiting.

## 2016-07-14 NOTE — Progress Notes (Signed)
Pre-visit discussion using our clinic review tool. No additional management support is needed unless otherwise documented below in the visit note.  

## 2016-07-14 NOTE — ED Notes (Signed)
ED Provider at bedside. 

## 2016-07-14 NOTE — ED Notes (Signed)
Pt departed in NAD.  

## 2016-07-14 NOTE — ED Triage Notes (Signed)
Patient had sudden onset of blurred vision and dizziness this am 0230 with nausea. On arrival no neuro deficits present but complains of ongoing dizziness. Alert and oriented.

## 2016-07-14 NOTE — ED Provider Notes (Signed)
  Physical Exam  BP 114/75   Pulse 62   Temp 98.8 F (37.1 C) (Oral)   Resp 20   LMP 07/10/2016   SpO2 99%   Physical Exam  ED Course  Procedures  MDM Assumed care at 1600. Agree with former H&P. Pt p/w dizzy and lightheadedness, very positional. Likely peripheral vertigo. Giving Meclizine and Ativan. Reassess sx and likely d/c.   I reassessed pt. She has improved symptomatically but still having some positional dizziness. She and her mother report her PCP and Neurologist wanted her sent here to have CT scan to r/o bleed. I strongly suspect peripheral vertigo, however CT scan ordered to r/o intracranial process. CT normal. Plan is for d/c with Meclizine and Zofran. Pt and her mother are in agreement with plan. Pt ambulating without difficulty. Plan is for pt to f.u with PCP.     Lennette BihariJohn Michael Nitin Mckowen, MD 07/15/16 1125    Marily MemosJason Mesner, MD 07/16/16 [phone]

## 2016-07-14 NOTE — ED Notes (Signed)
PT vomits with position changes. PT reports she has been constantly dizzy. Dizziness is worse with position changes. PT feels as though room is spinning

## 2016-07-15 ENCOUNTER — Encounter: Payer: Self-pay | Admitting: Internal Medicine

## 2016-07-15 ENCOUNTER — Telehealth: Payer: Self-pay | Admitting: *Deleted

## 2016-07-15 DIAGNOSIS — R42 Dizziness and giddiness: Secondary | ICD-10-CM

## 2016-07-15 NOTE — Telephone Encounter (Signed)
Pt was seen in the office on 07/14/17 following up at the emergency room. She continues to have dizziness. Pt will also need a ER follow up with Dr. Lorin PicketScott, Please give a time and date  Pt contact [phone]

## 2016-07-16 ENCOUNTER — Encounter: Payer: Self-pay | Admitting: Internal Medicine

## 2016-07-16 DIAGNOSIS — N938 Other specified abnormal uterine and vaginal bleeding: Secondary | ICD-10-CM | POA: Insufficient documentation

## 2016-07-16 NOTE — Telephone Encounter (Signed)
I have been sending her my chart messages.  We are in the process of getting her an appt with neurology and ent asap.  Someone should be calling her with an appt date and time.  If we need to change this appt can.  We will see when we can get her other appts arranged.

## 2016-07-16 NOTE — Telephone Encounter (Signed)
See my chart message.  I saw pt and sent her to ER after discussing with neurology.  She is having persistent dizziness, unable to work , Catering manageretc.  Still unclear as to exact etiology.  Needs appt with ENT and neurology asap.  Let me know if I need to do anything.  Thanks    Dr Lorin PicketScott

## 2016-07-16 NOTE — Telephone Encounter (Signed)
App made for 07-28-16 at 3:30. Pt informed about time and date of app.

## 2016-07-16 NOTE — Assessment & Plan Note (Signed)
S/p D&C.  Doing better.  Bleeding better.  Regular periods.  Follow.  Check cbc.

## 2016-07-16 NOTE — Assessment & Plan Note (Signed)
Has a history of migraines.  Her current symptoms are not c/w her migraines or any previous auras.  Refer to ER for w/up as outlined.

## 2016-07-20 ENCOUNTER — Telehealth: Payer: Self-pay

## 2016-07-20 DIAGNOSIS — H8111 Benign paroxysmal vertigo, right ear: Secondary | ICD-10-CM | POA: Diagnosis not present

## 2016-07-20 DIAGNOSIS — R42 Dizziness and giddiness: Secondary | ICD-10-CM | POA: Diagnosis not present

## 2016-07-20 NOTE — Telephone Encounter (Signed)
fmla form received from fax. In your box for review.

## 2016-07-21 ENCOUNTER — Encounter: Payer: Self-pay | Admitting: Internal Medicine

## 2016-07-21 NOTE — Telephone Encounter (Signed)
mychart message sent to patient to let her know that form is at reception for pick up. Copy sent to scan

## 2016-07-21 NOTE — Telephone Encounter (Signed)
Form completed.  Placed in box.  Will need copy for our records.

## 2016-07-27 DIAGNOSIS — G4719 Other hypersomnia: Secondary | ICD-10-CM | POA: Diagnosis not present

## 2016-07-27 DIAGNOSIS — E538 Deficiency of other specified B group vitamins: Secondary | ICD-10-CM | POA: Diagnosis not present

## 2016-07-27 DIAGNOSIS — R42 Dizziness and giddiness: Secondary | ICD-10-CM | POA: Diagnosis not present

## 2016-07-27 DIAGNOSIS — G43119 Migraine with aura, intractable, without status migrainosus: Secondary | ICD-10-CM | POA: Diagnosis not present

## 2016-07-28 ENCOUNTER — Ambulatory Visit: Payer: Self-pay | Admitting: Internal Medicine

## 2016-07-28 ENCOUNTER — Other Ambulatory Visit: Payer: Self-pay | Admitting: Neurology

## 2016-07-28 DIAGNOSIS — G43119 Migraine with aura, intractable, without status migrainosus: Secondary | ICD-10-CM

## 2016-08-05 DIAGNOSIS — R42 Dizziness and giddiness: Secondary | ICD-10-CM | POA: Diagnosis not present

## 2016-08-11 ENCOUNTER — Ambulatory Visit: Payer: 59

## 2016-08-20 ENCOUNTER — Ambulatory Visit
Admission: RE | Admit: 2016-08-20 | Discharge: 2016-08-20 | Disposition: A | Discharge status: Home / Self Care | Payer: 59 | Source: Ambulatory Visit | Attending: Neurology | Admitting: Neurology

## 2016-08-20 DIAGNOSIS — H539 Unspecified visual disturbance: Secondary | ICD-10-CM | POA: Diagnosis not present

## 2016-08-20 DIAGNOSIS — G43909 Migraine, unspecified, not intractable, without status migrainosus: Secondary | ICD-10-CM | POA: Diagnosis not present

## 2016-08-20 DIAGNOSIS — G43119 Migraine with aura, intractable, without status migrainosus: Secondary | ICD-10-CM | POA: Diagnosis not present

## 2016-08-20 DIAGNOSIS — R42 Dizziness and giddiness: Secondary | ICD-10-CM | POA: Diagnosis not present

## 2016-08-20 MED ORDER — GADOBENATE DIMEGLUMINE 529 MG/ML IV SOLN
20.0000 mL | Freq: Once | INTRAVENOUS | Status: AC | PRN
  Administered 2016-08-20: 20 mL via INTRAVENOUS

## 2016-08-25 ENCOUNTER — Other Ambulatory Visit: Payer: Self-pay | Admitting: Internal Medicine

## 2016-08-26 MED FILL — INTEGRA CAPSULE: 62.5-62.5-4 | 30 days supply | Qty: 30 | Fill #0

## 2016-08-27 ENCOUNTER — Telehealth: Payer: Self-pay

## 2016-08-27 MED ORDER — RIZATRIPTAN BENZOATE 10 MG PO TBDP: 10.0000 mg | ORAL_TABLET | ORAL | 0 refills | Status: DC | PRN

## 2016-08-27 NOTE — Telephone Encounter (Signed)
Medication: Rizatriptan   Directions: one tab for migraine may repeat in 2 hrs if needed Last given: 01/02/16 #10 Number refills: 0 Last o/v: 07-14-16 Follow up: n/a

## 2016-08-27 NOTE — Telephone Encounter (Signed)
ok'd rx for maxalt #10 with no refills.

## 2016-08-28 MED FILL — RIZATRIPTAN 10 MG ODT: 10 | 20 days supply | Qty: 10 | Fill #0

## 2016-09-02 DIAGNOSIS — R42 Dizziness and giddiness: Secondary | ICD-10-CM | POA: Diagnosis not present

## 2016-09-21 ENCOUNTER — Telehealth: Payer: 59 | Admitting: Family

## 2016-09-21 DIAGNOSIS — J029 Acute pharyngitis, unspecified: Secondary | ICD-10-CM

## 2016-09-21 MED ORDER — PREDNISONE 5 MG PO TABS: 5.0000 mg | ORAL_TABLET | ORAL | 0 refills | Status: DC

## 2016-09-21 MED ORDER — BENZONATATE 100 MG PO CAPS: 100.0000 mg | ORAL_CAPSULE | Freq: Three times a day (TID) | ORAL | 0 refills | Status: DC | PRN

## 2016-09-21 NOTE — Progress Notes (Signed)
We are sorry that you are not feeling well.  Here is how we plan to help!  Based on what you have shared with me it looks like you have upper respiratory tract inflammation that has resulted in a significant cough.  Inflammation and infection in the upper respiratory tract is commonly called bronchitis and has four common causes:  Allergies, Viral Infections, Acid Reflux and Bacterial Infections.  Allergies, viruses and acid reflux are treated by controlling symptoms or eliminating the cause. An example might be a cough caused by taking certain blood pressure medications. You stop the cough by changing the medication. Another example might be a cough caused by acid reflux. Controlling the reflux helps control the cough.  Based on your presentation I believe you most likely have A cough due to a virus.  This is called viral bronchitis and is best treated by rest, plenty of fluids and control of the cough.  You may use Ibuprofen or Tylenol as directed to help your symptoms.     In addition you may use A non-prescription cough medication called Mucinex DM: take 2 tablets every 12 hours. and A prescription cough medication called Tessalon Perles 100mg. You may take 1-2 capsules every 8 hours as needed for your cough.  Sterapred 5 mg dosepak  USE OF BRONCHODILATOR ("RESCUE") INHALERS: There is a risk from using your bronchodilator too frequently.  The risk is that over-reliance on a medication which only relaxes the muscles surrounding the breathing tubes can reduce the effectiveness of medications prescribed to reduce swelling and congestion of the tubes themselves.  Although you feel brief relief from the bronchodilator inhaler, your asthma may actually be worsening with the tubes becoming more swollen and filled with mucus.  This can delay other crucial treatments, such as oral steroid medications. If you need to use a bronchodilator inhaler daily, several times per day, you should discuss this with your  provider.  There are probably better treatments that could be used to keep your asthma under control.     HOME CARE . Only take medications as instructed by your medical team. . Complete the entire course of an antibiotic. . Drink plenty of fluids and get plenty of rest. . Avoid close contacts especially the very young and the elderly . Cover your mouth if you cough or cough into your sleeve. . Always remember to wash your hands . A steam or ultrasonic humidifier can help congestion.   GET HELP RIGHT AWAY IF: . You develop worsening fever. . You become short of breath . You cough up blood. . Your symptoms persist after you have completed your treatment plan MAKE SURE YOU   Understand these instructions.  Will watch your condition.  Will get help right away if you are not doing well or get worse.  Your e-visit answers were reviewed by a board certified advanced clinical practitioner to complete your personal care plan.  Depending on the condition, your plan could have included both over the counter or prescription medications. If there is a problem please reply  once you have received a response from your provider. Your safety is important to us.  If you have drug allergies check your prescription carefully.    You can use MyChart to ask questions about today's visit, request a non-urgent call back, or ask for a work or school excuse for 24 hours related to this e-Visit. If it has been greater than 24 hours you will need to follow up with your provider,   or enter a new e-Visit to address those concerns. You will get an e-mail in the next two days asking about your experience.  I hope that your e-visit has been valuable and will speed your recovery. Thank you for using e-visits.   

## 2016-09-30 DIAGNOSIS — R42 Dizziness and giddiness: Secondary | ICD-10-CM | POA: Diagnosis not present

## 2016-09-30 DIAGNOSIS — G43119 Migraine with aura, intractable, without status migrainosus: Secondary | ICD-10-CM | POA: Diagnosis not present

## 2017-01-06 ENCOUNTER — Encounter: Payer: Self-pay | Admitting: Internal Medicine

## 2017-01-06 NOTE — Telephone Encounter (Signed)
I called pt.  She reports she does not need anything today.  Has good support from her brothers.  Agreed to see me tomorrow at 12:30.  Ok to wait until tomorrow.  Explained I would be out of office this pm. Please add to schedule tomorrow.  Thanks

## 2017-01-07 ENCOUNTER — Ambulatory Visit (INDEPENDENT_AMBULATORY_CARE_PROVIDER_SITE_OTHER): Payer: 59 | Admitting: Internal Medicine

## 2017-01-07 ENCOUNTER — Encounter: Payer: Self-pay | Admitting: Internal Medicine

## 2017-01-07 DIAGNOSIS — F439 Reaction to severe stress, unspecified: Secondary | ICD-10-CM

## 2017-01-07 MED ORDER — ALPRAZOLAM 0.25 MG PO TABS: 0.2500 mg | ORAL_TABLET | Freq: Two times a day (BID) | ORAL | 0 refills | Status: DC | PRN

## 2017-01-07 NOTE — Progress Notes (Signed)
Patient ID: Tina Fleming, female   DOB: [birth year], 36 y.o.   MRN: [id number]   Subjective:    Patient ID: Tina Fleming, female    DOB: [birth year], 36 y.o.   MRN: [id number]  HPI  Patient here as a work in appt.  She is accompanied by her sister-n-law.  History obtained from both of them.  She reports increased stress recently.  Her father shot and killed her mother and then killed himself.  This just occurred last week.  They are in the process of making arrangements.  Discussed with her today.  She has good support.  She is staying with her brother and another brother is staying with her.  Some increased anxiety.  She is sleeping some.  Mind does not want to shut down at night.  No chest pain.  Breathing stable.  Some decreased appetite.  Stomach has been a little upset - relates to the increased stress. She has been out of work.  No suicidal ideations.     Past Medical History:  Diagnosis Date  . Abnormal EKG    no cardiology follow  up was needed  . Anemia    iron suppliments in past-not currently  . Anemia   . Heart murmur    asymtomatic  . History of kidney stones   . IBS (irritable bowel syndrome)   . Migraine   . Nephrolithiasis   . Obesity   . PONV (postoperative nausea and vomiting)    prolonged sedation  . Vitamin D deficiency    Past Surgical History:  Procedure Laterality Date  . APPENDECTOMY  03/21/12   laproscopic appy  . DILATATION & CURETTAGE/HYSTEROSCOPY WITH MYOSURE N/A 03/20/2016   Procedure: DILATATION & CURETTAGE/HYSTEROSCOPY WITH MYOSURE;  Surgeon: Maxie Better, MD;  Location: [facility];  Service: Gynecology;  Laterality: N/A;  45 min. requested  . KNEE ARTHROSCOPY  04/24/2011   Procedure: ARTHROSCOPY KNEE;  Surgeon: Javier Docker;  Location: [facility];  Service: Orthopedics;  Laterality: Right;  /Arthroscopy with debridement, right knee  . LAPAROSCOPIC APPENDECTOMY  03/21/2012   Procedure: APPENDECTOMY LAPAROSCOPIC;  Surgeon:  Clovis Pu. Cornett, MD;  Location: [facility];  Service: General;  Laterality: N/A;  . LITHOTRIPSY    . URETHRAL STRICTURE DILATATION     as a child   Family History  Problem Relation Age of Onset  . Cancer Mother        pancreatic  . Arthritis Mother   . Hyperlipidemia Mother   . Hypertension Mother   . Cancer Father        skin  . Arthritis Father   . Hyperlipidemia Father   . Hypertension Father   . Diabetes Father   . Ankylosing spondylitis Brother   . Spondylitis Brother    Social History   Social History  . Marital status: Single    Spouse name: N/A  . Number of children: N/A  . Years of education: N/A   Social History Main Topics  . Smoking status: Never Smoker  . Smokeless tobacco: Never Used  . Alcohol use No  . Drug use: No  . Sexual activity: Not Currently    Birth control/ protection: Pill   Other Topics Concern  . None   Social History Narrative  . None    Outpatient Encounter Prescriptions as of 01/07/2017  Medication Sig  . aspirin-acetaminophen-caffeine (EXCEDRIN MIGRAINE) 250-250-65 MG tablet Take 1 tablet by mouth every 6 (six) hours as needed for headache or migraine.  Marland Kitchen  Cholecalciferol (VITAMIN D PO) Take 1 capsule by mouth every morning.  . Fe Fum-FePoly-Vit C-Vit B3 (INTEGRA) 62.5-62.5-40-3 MG CAPS Take 1 tablet by mouth daily.  . Fe Fum-FePoly-Vit C-Vit B3 (INTEGRA) 62.5-62.5-40-3 MG CAPS TAKE 1 CAPSULE BY MOUTH ONCE DAILY  . rizatriptan (MAXALT-MLT) 10 MG disintegrating tablet Take 1 tablet (10 mg total) by mouth as needed for migraine. May repeat in 2 hours if needed  . ALPRAZolam (XANAX) 0.25 MG tablet Take 1 tablet (0.25 mg total) by mouth 2 (two) times daily as needed for anxiety.  . [DISCONTINUED] benzonatate (TESSALON PERLES) 100 MG capsule Take 1-2 capsules (100-200 mg total) by mouth every 8 (eight) hours as needed for cough. (Patient not taking: Reported on 01/07/2017)  . [DISCONTINUED] meclizine (ANTIVERT) 25 MG tablet Take 1 tablet (25  mg total) by mouth 3 (three) times daily as needed for dizziness. (Patient not taking: Reported on 01/07/2017)  . [DISCONTINUED] ondansetron (ZOFRAN ODT) 4 MG disintegrating tablet Take 1 tablet (4 mg total) by mouth every 8 (eight) hours as needed for nausea or vomiting. (Patient not taking: Reported on 01/07/2017)  . [DISCONTINUED] predniSONE (DELTASONE) 5 MG tablet Take 1 tablet (5 mg total) by mouth as directed. sterapred generic taper (Patient not taking: Reported on 01/07/2017)   No facility-administered encounter medications on file as of 01/07/2017.     Review of Systems  Constitutional:       Some decreased appetite.  Has lost weight.    Respiratory: Negative for chest tightness and shortness of breath.   Gastrointestinal: Negative for abdominal pain, nausea and vomiting.  Musculoskeletal: Negative for joint swelling and myalgias.  Neurological: Negative for dizziness, light-headedness and headaches.  Psychiatric/Behavioral:       Increased stress as outlined.         Objective:    Physical Exam  Constitutional: She appears well-developed and well-nourished. No distress.  Psychiatric: Her behavior is normal. Judgment and thought content normal.  Did not perform physical given nature of visit.     BP 116/72 (BP Location: Left Arm, Patient Position: Sitting, Cuff Size: Normal)   Pulse 67   Temp 98.5 F (36.9 C) (Oral)   Resp 16   Wt 229 lb 4 oz (104 kg)   SpO2 98%   BMI 38.15 kg/m  Wt Readings from Last 3 Encounters:  01/07/17 229 lb 4 oz (104 kg)  07/14/16 237 lb (107.5 kg)  03/11/16 241 lb 6 oz (109.5 kg)     Lab Results  Component Value Date   WBC 9.2 07/14/2016   HGB 11.3 (L) 07/14/2016   HCT 39.1 07/14/2016   PLT 476 (H) 07/14/2016   GLUCOSE 100 (H) 07/14/2016   CHOL 180 02/08/2015   TRIG 115.0 02/08/2015   HDL 46.40 02/08/2015   LDLCALC 111 (H) 02/08/2015   ALT 23 07/14/2016   AST 23 07/14/2016   NA 140 07/14/2016   K 4.3 07/14/2016   CL 104  07/14/2016   CREATININE 0.77 07/14/2016   BUN 10 07/14/2016   CO2 24 07/14/2016   TSH 3.01 01/08/2016   INR 1.09 07/14/2016    Mr Brain W Wo Contrast  Result Date: 08/20/2016 CLINICAL DATA:  Migraine headache.  Visual disturbance and vomiting. EXAM: MRI HEAD WITHOUT AND WITH CONTRAST TECHNIQUE: Multiplanar, multiecho pulse sequences of the brain and surrounding structures were obtained without and with intravenous contrast. CONTRAST:  20mL MULTIHANCE GADOBENATE DIMEGLUMINE 529 MG/ML IV SOLN COMPARISON:  Head CT 07/14/2016 FINDINGS: Brain: The brain has normal  appearance without evidence of malformation, atrophy, old or acute small or large vessel infarction, hemorrhage, hydrocephalus or extra-axial collection. No pituitary abnormality. After contrast administration, one can appreciate a small developmental venous anomaly in the left pons, not of any clinical significance. No abnormal enhancement of significance. Vascular: Major vessels at the base of the brain show flow. Skull and upper cervical spine: Normal Sinuses/Orbits: Clear/ normal. Other: None significant. IMPRESSION: Normal examination. No cause of headache identified. 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 Electronically Signed   By: Paulina Fusi M.D.   On: 08/20/2016 10:02       Assessment & Plan:   Problem List Items Addressed This Visit    Stress    Increased stress as outlined.  Discussed with her today.  She has good support.  Have asked her to continue to stay with her brother.  She has contacted counseling through her work.  I also gave her names of some counselors.  No suicidal ideations.  Discussed treatment options.  Discussed SSRI.  zoloft previously made her more depressed.  She prefers just to have something as needed.  rx given for xanax.  Follow closely.  Schedule a f/u appt soon.            Dale Carbon, MD

## 2017-01-10 ENCOUNTER — Encounter: Payer: Self-pay | Admitting: Internal Medicine

## 2017-01-10 DIAGNOSIS — F439 Reaction to severe stress, unspecified: Secondary | ICD-10-CM | POA: Insufficient documentation

## 2017-01-10 NOTE — Assessment & Plan Note (Signed)
Increased stress as outlined.  Discussed with her today.  She has good support.  Have asked her to continue to stay with her brother.  She has contacted counseling through her work.  I also gave her names of some counselors.  No suicidal ideations.  Discussed treatment options.  Discussed SSRI.  zoloft previously made her more depressed.  She prefers just to have something as needed.  rx given for xanax.  Follow closely.  Schedule a f/u appt soon.

## 2017-01-10 NOTE — Telephone Encounter (Signed)
Sent my chart message to pt for update.

## 2017-01-20 ENCOUNTER — Encounter: Payer: Self-pay | Admitting: Internal Medicine

## 2017-01-29 NOTE — Telephone Encounter (Signed)
I was going to call her, but realized she has an appt with you on the 12th at 12 already, are you wanting to move it to the 11th then?

## 2017-01-29 NOTE — Telephone Encounter (Signed)
I did not realize she had an appt on 02/03/17.  Whichever one works best for her.  Just need to make sure she is ok and ok to wait for appt next week.   Thanks

## 2017-01-29 NOTE — Telephone Encounter (Signed)
I attempted to reach via phone, left a VM to return my call, I will mychart as well. thanks

## 2017-01-29 NOTE — Telephone Encounter (Signed)
Reviewed her my chart message.  Please call pt and confirm she is ok.  I will need to see her and we can discuss other treatment.  I can see her on 02/02/17 at 8:00 for work in for this.  Make sure ok to wait until then.  Also, she is seeing a Camera operatorgrief counselor.  Let her know that I can schedule her to see a psychiatrist as well - if needed.

## 2017-02-02 ENCOUNTER — Encounter: Payer: Self-pay | Admitting: Internal Medicine

## 2017-02-02 ENCOUNTER — Ambulatory Visit (INDEPENDENT_AMBULATORY_CARE_PROVIDER_SITE_OTHER): Payer: 59 | Admitting: Internal Medicine

## 2017-02-02 DIAGNOSIS — F439 Reaction to severe stress, unspecified: Secondary | ICD-10-CM | POA: Diagnosis not present

## 2017-02-02 MED ORDER — ESCITALOPRAM OXALATE 10 MG PO TABS: 10.0000 mg | ORAL_TABLET | Freq: Every day | ORAL | 1 refills | Status: DC

## 2017-02-02 MED FILL — ESCITALOPRAM 10 MG TABLET: 10 | 30 days supply | Qty: 30 | Fill #0

## 2017-02-02 NOTE — Progress Notes (Signed)
Patient ID: Tina Fleming, female   DOB: Nov 30, 1980, 36 y.o.   MRN: [id number]   Subjective:    Patient ID: Tina Fleming, female    DOB: 09-11-1980, 36 y.o.   MRN: [id number]  HPI  Patient here as a work in with concerns regarding persistent increased stress.  See previous note for details.  Discussed with her today.  She is still having a hard time dealing with her mother's unexpected death.  She has seen a counselor and plans to continue to f/u with her.  She has tried to go back to work.  Developed increased anxiety.  Feels she needs to be on something to help level things out.  She is eating and sleeping.  Still staying with her brothers.  No suicidal thoughts.     Past Medical History:  Diagnosis Date  . Abnormal EKG    no cardiology follow  up was needed  . Anemia    iron suppliments in past-not currently  . Anemia   . Heart murmur    asymtomatic  . History of kidney stones   . IBS (irritable bowel syndrome)   . Migraine   . Nephrolithiasis   . Obesity   . PONV (postoperative nausea and vomiting)    prolonged sedation  . Vitamin D deficiency    Past Surgical History:  Procedure Laterality Date  . APPENDECTOMY  03/21/12   laproscopic appy  . DILATATION & CURETTAGE/HYSTEROSCOPY WITH MYOSURE N/A 03/20/2016   Procedure: DILATATION & CURETTAGE/HYSTEROSCOPY WITH MYOSURE;  Surgeon: Maxie Better, MD;  Location: [facility];  Service: Gynecology;  Laterality: N/A;  45 min. requested  . KNEE ARTHROSCOPY  04/24/2011   Procedure: ARTHROSCOPY KNEE;  Surgeon: Javier Docker;  Location: [facility];  Service: Orthopedics;  Laterality: Right;  /Arthroscopy with debridement, right knee  . LAPAROSCOPIC APPENDECTOMY  03/21/2012   Procedure: APPENDECTOMY LAPAROSCOPIC;  Surgeon: Clovis Pu. Cornett, MD;  Location: [facility];  Service: General;  Laterality: N/A;  . LITHOTRIPSY    . URETHRAL STRICTURE DILATATION     as a child   Family History  Problem Relation Age of Onset    . Cancer Mother        pancreatic  . Arthritis Mother   . Hyperlipidemia Mother   . Hypertension Mother   . Cancer Father        skin  . Arthritis Father   . Hyperlipidemia Father   . Hypertension Father   . Diabetes Father   . Ankylosing spondylitis Brother   . Spondylitis Brother    Social History   Social History  . Marital status: Single    Spouse name: N/A  . Number of children: N/A  . Years of education: N/A   Social History Main Topics  . Smoking status: Never Smoker  . Smokeless tobacco: Never Used  . Alcohol use No  . Drug use: No  . Sexual activity: Not Currently    Birth control/ protection: Pill   Other Topics Concern  . None   Social History Narrative  . None    Outpatient Encounter Prescriptions as of 02/02/2017  Medication Sig  . ALPRAZolam (XANAX) 0.25 MG tablet Take 1 tablet (0.25 mg total) by mouth 2 (two) times daily as needed for anxiety.  Marland Kitchen aspirin-acetaminophen-caffeine (EXCEDRIN MIGRAINE) 250-250-65 MG tablet Take 1 tablet by mouth every 6 (six) hours as needed for headache or migraine.  . Cholecalciferol (VITAMIN D PO) Take 1 capsule by mouth every morning.  Marland Kitchen  Fe Fum-FePoly-Vit C-Vit B3 (INTEGRA) 62.5-62.5-40-3 MG CAPS Take 1 tablet by mouth daily.  . Fe Fum-FePoly-Vit C-Vit B3 (INTEGRA) 62.5-62.5-40-3 MG CAPS TAKE 1 CAPSULE BY MOUTH ONCE DAILY  . rizatriptan (MAXALT-MLT) 10 MG disintegrating tablet Take 1 tablet (10 mg total) by mouth as needed for migraine. May repeat in 2 hours if needed  . escitalopram (LEXAPRO) 10 MG tablet Take 1 tablet (10 mg total) by mouth daily.   No facility-administered encounter medications on file as of 02/02/2017.     Review of Systems  Constitutional: Negative for appetite change and unexpected weight change.  HENT: Negative for congestion and sinus pressure.   Respiratory: Negative for cough and shortness of breath.   Cardiovascular: Negative for chest pain, palpitations and leg swelling.   Gastrointestinal: Negative for diarrhea, nausea and vomiting.  Neurological: Negative for dizziness, light-headedness and headaches.  Psychiatric/Behavioral: Negative for agitation.       Increased stress and anxiety as outlined.         Objective:    Physical Exam  Constitutional: She appears well-developed and well-nourished. No distress.  HENT:  Nose: Nose normal.  Mouth/Throat: Oropharynx is clear and moist.  Neck: Neck supple. No thyromegaly present.  Cardiovascular: Normal rate and regular rhythm.   Pulmonary/Chest: Breath sounds normal. No respiratory distress. She has no wheezes.  Abdominal: Soft. Bowel sounds are normal. There is no tenderness.  Musculoskeletal: She exhibits no edema or tenderness.  Lymphadenopathy:    She has no cervical adenopathy.  Skin: No rash noted. No erythema.  Psychiatric: She has a normal mood and affect. Her behavior is normal.    BP 128/82 (BP Location: Left Arm, Patient Position: Sitting, Cuff Size: Normal)   Pulse 62   Temp 98 F (36.7 C) (Oral)   Ht 5' 4.96" (1.65 m)   Wt 230 lb 3.2 oz (104.4 kg)   SpO2 98%   BMI 38.35 kg/m  Wt Readings from Last 3 Encounters:  02/02/17 230 lb 3.2 oz (104.4 kg)  01/07/17 229 lb 4 oz (104 kg)  07/14/16 237 lb (107.5 kg)     Lab Results  Component Value Date   WBC 9.2 07/14/2016   HGB 11.3 (L) 07/14/2016   HCT 39.1 07/14/2016   PLT 476 (H) 07/14/2016   GLUCOSE 100 (H) 07/14/2016   CHOL 180 02/08/2015   TRIG 115.0 02/08/2015   HDL 46.40 02/08/2015   LDLCALC 111 (H) 02/08/2015   ALT 23 07/14/2016   AST 23 07/14/2016   NA 140 07/14/2016   K 4.3 07/14/2016   CL 104 07/14/2016   CREATININE 0.77 07/14/2016   BUN 10 07/14/2016   CO2 24 07/14/2016   TSH 3.01 01/08/2016   INR 1.09 07/14/2016    Mr Brain W Wo Contrast  Result Date: 08/20/2016 CLINICAL DATA:  Migraine headache.  Visual disturbance and vomiting. EXAM: MRI HEAD WITHOUT AND WITH CONTRAST TECHNIQUE: Multiplanar, multiecho  pulse sequences of the brain and surrounding structures were obtained without and with intravenous contrast. CONTRAST:  20mL MULTIHANCE GADOBENATE DIMEGLUMINE 529 MG/ML IV SOLN COMPARISON:  Head CT 07/14/2016 FINDINGS: Brain: The brain has normal appearance without evidence of malformation, atrophy, old or acute small or large vessel infarction, hemorrhage, hydrocephalus or extra-axial collection. No pituitary abnormality. After contrast administration, one can appreciate a small developmental venous anomaly in the left pons, not of any clinical significance. No abnormal enhancement of significance. Vascular: Major vessels at the base of the brain show flow. Skull and upper cervical spine: Normal  Sinuses/Orbits: Clear/ normal. Other: None significant. IMPRESSION: Normal examination. No cause of headache identified. 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 Electronically Signed   By: Paulina FusiMark  Shogry M.D.   On: 08/20/2016 10:02       Assessment & Plan:   Problem List Items Addressed This Visit    Stress    Increased stress and anxiety as outlined.  Discussed with her today.  Seeing a Veterinary surgeoncounselor.  Feels needs something to help level things out.  Will start lexapro.  Will notify me if feels needs anything more.  Follow closely.            Dale DurhamSCOTT, Samaj Wessells, MD

## 2017-02-03 ENCOUNTER — Ambulatory Visit: Payer: Self-pay | Admitting: Internal Medicine

## 2017-02-04 ENCOUNTER — Encounter: Payer: Self-pay | Admitting: Internal Medicine

## 2017-02-04 NOTE — Assessment & Plan Note (Signed)
Increased stress and anxiety as outlined.  Discussed with her today.  Seeing a Veterinary surgeoncounselor.  Feels needs something to help level things out.  Will start lexapro.  Will notify me if feels needs anything more.  Follow closely.

## 2017-02-09 ENCOUNTER — Other Ambulatory Visit: Payer: Self-pay | Admitting: Internal Medicine

## 2017-02-09 ENCOUNTER — Encounter: Payer: Self-pay | Admitting: Internal Medicine

## 2017-02-09 DIAGNOSIS — F329 Major depressive disorder, single episode, unspecified: Secondary | ICD-10-CM

## 2017-02-09 DIAGNOSIS — F419 Anxiety disorder, unspecified: Secondary | ICD-10-CM

## 2017-02-09 DIAGNOSIS — F439 Reaction to severe stress, unspecified: Secondary | ICD-10-CM

## 2017-02-09 NOTE — Progress Notes (Signed)
Order placed for psych referral.   

## 2017-02-19 DIAGNOSIS — Z0279 Encounter for issue of other medical certificate: Secondary | ICD-10-CM

## 2017-02-19 NOTE — Telephone Encounter (Signed)
Pt called to follow up on her FMLA paperwork. Thank you!

## 2017-02-22 ENCOUNTER — Telehealth: Payer: Self-pay | Admitting: *Deleted

## 2017-02-22 NOTE — Telephone Encounter (Signed)
Form completed and in box.  Please notify pt.

## 2017-02-22 NOTE — Telephone Encounter (Signed)
Pt has requested a update of the FMLA forms that were faxed over by [employer].  Pt contact [phone]  Pt will need this returned to her job by Feb 24 2017

## 2017-02-22 NOTE — Telephone Encounter (Signed)
Called patient let her know that we have faxed and left copy at reception for her.Marland Kitchen

## 2017-02-26 ENCOUNTER — Encounter: Payer: Self-pay | Admitting: Internal Medicine

## 2017-02-26 NOTE — Telephone Encounter (Signed)
Need paper work.  I do not have.

## 2017-03-01 NOTE — Telephone Encounter (Signed)
Pt requested a call  Pt contact [phone]

## 2017-03-03 ENCOUNTER — Telehealth: Payer: Self-pay

## 2017-03-03 NOTE — Telephone Encounter (Signed)
FMLA paperwork in your red folder.

## 2017-03-03 NOTE — Telephone Encounter (Signed)
Please notify pt that I have added a return to work date of 03/16/17 with plans to work 4 hour days (with plans to advance as tolerated).  Form placed in box.

## 2017-03-04 NOTE — Telephone Encounter (Signed)
FMLA paperwork completed and date changed.  Already placed in box.  Have also just received short term disability paper work to complete.

## 2017-03-04 NOTE — Telephone Encounter (Signed)
Please call pt in regards to her FMLA paperwork. She stated she was denied today.  Pt contact [phone]

## 2017-03-05 NOTE — Telephone Encounter (Signed)
Completed and signed.  Placed back in box.

## 2017-03-05 NOTE — Telephone Encounter (Signed)
Called patient have sent to office this am let her know

## 2017-03-08 DIAGNOSIS — F4323 Adjustment disorder with mixed anxiety and depressed mood: Secondary | ICD-10-CM | POA: Diagnosis not present

## 2017-03-08 DIAGNOSIS — F5105 Insomnia due to other mental disorder: Secondary | ICD-10-CM | POA: Diagnosis not present

## 2017-03-11 NOTE — Telephone Encounter (Signed)
Another form received and placed in your red folder for review.

## 2017-03-12 NOTE — Telephone Encounter (Signed)
Received another form.  Need to clarify exactly what is needed.  She sent me a letter stating she wanted to return to work - 4 hour work days.  I added this to the form - will plan to start 03/16/17 4 hour work days with plans to advance as tolerated.  Phone note states denied.  Form I received states needs clarification on reduced work schedule.  I am not sure what I need to add.  See me about this.  Form placed back in box.

## 2017-03-16 NOTE — Telephone Encounter (Signed)
Pt informed. Forms faxed and pt picking up hard copy

## 2017-03-16 NOTE — Telephone Encounter (Signed)
Filled out new form and put in blue folder only needs your signature .

## 2017-03-16 NOTE — Telephone Encounter (Signed)
Signed and placed in box.   

## 2017-03-18 ENCOUNTER — Encounter: Payer: Self-pay | Admitting: Internal Medicine

## 2017-03-18 ENCOUNTER — Ambulatory Visit (INDEPENDENT_AMBULATORY_CARE_PROVIDER_SITE_OTHER): Payer: 59 | Admitting: Internal Medicine

## 2017-03-18 DIAGNOSIS — F439 Reaction to severe stress, unspecified: Secondary | ICD-10-CM | POA: Diagnosis not present

## 2017-03-18 NOTE — Progress Notes (Signed)
Patient ID: Tina Fleming, female   DOB: June 09, 1980, 36 y.o.   MRN: [id number]   Subjective:    Patient ID: Tina Fleming, female    DOB: 1980/08/14, 36 y.o.   MRN: [id number]  HPI  Patient here for a scheduled follow up.  Still dealing with increased stress and anxiety with her parents recent death.  See previous notes for details.  She is now seeing psychiatry.  On effexor now.  Just increased dose.  Discussed with her today.  She has gone back to work.  Plans to start 4 hours per day this week.  Will increase as tolerated.  She is back at her home now.  Handling this ok.  Ready to get back in a routine.  Has good family support.  No suicidal ideations.     Past Medical History:  Diagnosis Date  . Abnormal EKG    no cardiology follow  up was needed  . Anemia    iron suppliments in past-not currently  . Anemia   . Heart murmur    asymtomatic  . History of kidney stones   . IBS (irritable bowel syndrome)   . Migraine   . Nephrolithiasis   . Obesity   . PONV (postoperative nausea and vomiting)    prolonged sedation  . Vitamin D deficiency    Past Surgical History:  Procedure Laterality Date  . APPENDECTOMY  03/21/12   laproscopic appy  . DILATATION & CURETTAGE/HYSTEROSCOPY WITH MYOSURE N/A 03/20/2016   Procedure: DILATATION & CURETTAGE/HYSTEROSCOPY WITH MYOSURE;  Surgeon: Maxie Better, MD;  Location: [facility];  Service: Gynecology;  Laterality: N/A;  45 min. requested  . KNEE ARTHROSCOPY  04/24/2011   Procedure: ARTHROSCOPY KNEE;  Surgeon: Javier Docker;  Location: [facility];  Service: Orthopedics;  Laterality: Right;  /Arthroscopy with debridement, right knee  . LAPAROSCOPIC APPENDECTOMY  03/21/2012   Procedure: APPENDECTOMY LAPAROSCOPIC;  Surgeon: Clovis Pu. Cornett, MD;  Location: [facility];  Service: General;  Laterality: N/A;  . LITHOTRIPSY    . URETHRAL STRICTURE DILATATION     as a child   Family History  Problem Relation Age of Onset  . Cancer  Mother        pancreatic  . Arthritis Mother   . Hyperlipidemia Mother   . Hypertension Mother   . Cancer Father        skin  . Arthritis Father   . Hyperlipidemia Father   . Hypertension Father   . Diabetes Father   . Ankylosing spondylitis Brother   . Spondylitis Brother    Social History   Social History  . Marital status: Single    Spouse name: N/A  . Number of children: N/A  . Years of education: N/A   Social History Main Topics  . Smoking status: Never Smoker  . Smokeless tobacco: Never Used  . Alcohol use No  . Drug use: No  . Sexual activity: Not Currently    Birth control/ protection: Pill   Other Topics Concern  . None   Social History Narrative  . None    Outpatient Encounter Prescriptions as of 03/18/2017  Medication Sig  . ALPRAZolam (XANAX) 0.25 MG tablet Take 1 tablet (0.25 mg total) by mouth 2 (two) times daily as needed for anxiety.  Marland Kitchen aspirin-acetaminophen-caffeine (EXCEDRIN MIGRAINE) 250-250-65 MG tablet Take 1 tablet by mouth every 6 (six) hours as needed for headache or migraine.  . Cholecalciferol (VITAMIN D PO) Take 1 capsule by mouth every  morning.  . Fe Fum-FePoly-Vit C-Vit B3 (INTEGRA) 62.5-62.5-40-3 MG CAPS Take 1 tablet by mouth daily.  . Fe Fum-FePoly-Vit C-Vit B3 (INTEGRA) 62.5-62.5-40-3 MG CAPS TAKE 1 CAPSULE BY MOUTH ONCE DAILY  . rizatriptan (MAXALT-MLT) 10 MG disintegrating tablet Take 1 tablet (10 mg total) by mouth as needed for migraine. May repeat in 2 hours if needed  . venlafaxine (EFFEXOR) 75 MG tablet Take 75 mg by mouth daily.  . [DISCONTINUED] escitalopram (LEXAPRO) 10 MG tablet Take 1 tablet (10 mg total) by mouth daily.   No facility-administered encounter medications on file as of 03/18/2017.     Review of Systems  Constitutional: Negative for appetite change and unexpected weight change.  HENT: Negative for congestion and sinus pressure.   Respiratory: Negative for cough, chest tightness and shortness of breath.     Cardiovascular: Negative for chest pain, palpitations and leg swelling.  Gastrointestinal: Negative for abdominal pain, diarrhea, nausea and vomiting.  Genitourinary: Negative for difficulty urinating and dysuria.  Musculoskeletal: Negative for back pain and joint swelling.  Skin: Negative for color change and rash.  Neurological: Negative for dizziness, light-headedness and headaches.  Psychiatric/Behavioral: Negative for agitation and dysphoric mood.       Objective:    Physical Exam  Constitutional: She appears well-developed and well-nourished. No distress.  HENT:  Nose: Nose normal.  Mouth/Throat: Oropharynx is clear and moist.  Neck: Neck supple. No thyromegaly present.  Cardiovascular: Normal rate and regular rhythm.   Pulmonary/Chest: Breath sounds normal. No respiratory distress. She has no wheezes.  Abdominal: Soft. Bowel sounds are normal. There is no tenderness.  Musculoskeletal: She exhibits no edema or tenderness.  Lymphadenopathy:    She has no cervical adenopathy.  Skin: No rash noted. No erythema.  Psychiatric: She has a normal mood and affect. Her behavior is normal.    BP 130/80 (BP Location: Left Arm, Patient Position: Sitting, Cuff Size: Normal)   Pulse 71   Temp 98.2 F (36.8 C) (Oral)   Resp 17   Ht 5' 4.96" (1.65 m)   Wt 233 lb 9.6 oz (106 kg)   SpO2 99%   BMI 38.92 kg/m  Wt Readings from Last 3 Encounters:  03/18/17 233 lb 9.6 oz (106 kg)  02/02/17 230 lb 3.2 oz (104.4 kg)  01/07/17 229 lb 4 oz (104 kg)     Lab Results  Component Value Date   WBC 9.2 07/14/2016   HGB 11.3 (L) 07/14/2016   HCT 39.1 07/14/2016   PLT 476 (H) 07/14/2016   GLUCOSE 100 (H) 07/14/2016   CHOL 180 02/08/2015   TRIG 115.0 02/08/2015   HDL 46.40 02/08/2015   LDLCALC 111 (H) 02/08/2015   ALT 23 07/14/2016   AST 23 07/14/2016   NA 140 07/14/2016   K 4.3 07/14/2016   CL 104 07/14/2016   CREATININE 0.77 07/14/2016   BUN 10 07/14/2016   CO2 24 07/14/2016    TSH 3.01 01/08/2016   INR 1.09 07/14/2016    Mr Brain W Wo Contrast  Result Date: 08/20/2016 CLINICAL DATA:  Migraine headache.  Visual disturbance and vomiting. EXAM: MRI HEAD WITHOUT AND WITH CONTRAST TECHNIQUE: Multiplanar, multiecho pulse sequences of the brain and surrounding structures were obtained without and with intravenous contrast. CONTRAST:  20mL MULTIHANCE GADOBENATE DIMEGLUMINE 529 MG/ML IV SOLN COMPARISON:  Head CT 07/14/2016 FINDINGS: Brain: The brain has normal appearance without evidence of malformation, atrophy, old or acute small or large vessel infarction, hemorrhage, hydrocephalus or extra-axial collection. No pituitary abnormality.  After contrast administration, one can appreciate a small developmental venous anomaly in the left pons, not of any clinical significance. No abnormal enhancement of significance. Vascular: Major vessels at the base of the brain show flow. Skull and upper cervical spine: Normal Sinuses/Orbits: Clear/ normal. Other: None significant. IMPRESSION: Normal examination. No cause of headache identified. 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 Electronically Signed   By: Paulina Fusi M.D.   On: 08/20/2016 10:02       Assessment & Plan:   Problem List Items Addressed This Visit    Stress    Increased stress as outlined.  Discussed at length with her today.  She is seeing psychiatry.  On effexor now.  Continue f/u with psychiatry.  Just recently increased dose.            Dale Brownington, MD

## 2017-03-20 ENCOUNTER — Encounter: Payer: Self-pay | Admitting: Internal Medicine

## 2017-03-20 NOTE — Assessment & Plan Note (Signed)
Increased stress as outlined.  Discussed at length with her today.  She is seeing psychiatry.  On effexor now.  Continue f/u with psychiatry.  Just recently increased dose.

## 2017-03-24 DIAGNOSIS — F5105 Insomnia due to other mental disorder: Secondary | ICD-10-CM | POA: Diagnosis not present

## 2017-03-24 DIAGNOSIS — F4323 Adjustment disorder with mixed anxiety and depressed mood: Secondary | ICD-10-CM | POA: Diagnosis not present

## 2017-04-06 ENCOUNTER — Encounter: Payer: Self-pay | Admitting: Internal Medicine

## 2017-04-06 NOTE — Telephone Encounter (Signed)
Letter printed, signed and placed in box.   °

## 2017-05-11 ENCOUNTER — Encounter: Payer: Self-pay | Admitting: Internal Medicine

## 2017-05-12 DIAGNOSIS — G43119 Migraine with aura, intractable, without status migrainosus: Secondary | ICD-10-CM | POA: Diagnosis not present

## 2017-05-12 DIAGNOSIS — F4321 Adjustment disorder with depressed mood: Secondary | ICD-10-CM | POA: Diagnosis not present

## 2017-05-12 MED FILL — CYCLOBENZAPRINE 5 MG TABLET: 5 | 30 days supply | Qty: 60 | Fill #0

## 2017-05-14 DIAGNOSIS — G43111 Migraine with aura, intractable, with status migrainosus: Secondary | ICD-10-CM | POA: Insufficient documentation

## 2017-06-03 DIAGNOSIS — F411 Generalized anxiety disorder: Secondary | ICD-10-CM | POA: Diagnosis not present

## 2017-06-09 ENCOUNTER — Encounter: Payer: Self-pay | Admitting: Internal Medicine

## 2017-06-09 DIAGNOSIS — F411 Generalized anxiety disorder: Secondary | ICD-10-CM | POA: Diagnosis not present

## 2017-06-11 ENCOUNTER — Encounter: Payer: Self-pay | Admitting: Internal Medicine

## 2017-06-11 NOTE — Telephone Encounter (Signed)
I have typed and printed a letter for her.  Please contact pt to see if she wants us to fax the letter or does she want to pick it up.

## 2017-06-21 ENCOUNTER — Ambulatory Visit: Payer: 59 | Admitting: Internal Medicine

## 2017-06-21 ENCOUNTER — Encounter: Payer: Self-pay | Admitting: Internal Medicine

## 2017-06-21 VITALS — BP 128/68 | HR 85 | Temp 98.2°F | Resp 18 | Wt 241.2 lb

## 2017-06-21 DIAGNOSIS — E559 Vitamin D deficiency, unspecified: Secondary | ICD-10-CM

## 2017-06-21 DIAGNOSIS — D649 Anemia, unspecified: Secondary | ICD-10-CM | POA: Diagnosis not present

## 2017-06-21 DIAGNOSIS — G43109 Migraine with aura, not intractable, without status migrainosus: Secondary | ICD-10-CM

## 2017-06-21 DIAGNOSIS — Z13220 Encounter for screening for lipoid disorders: Secondary | ICD-10-CM

## 2017-06-21 DIAGNOSIS — F439 Reaction to severe stress, unspecified: Secondary | ICD-10-CM | POA: Diagnosis not present

## 2017-06-21 DIAGNOSIS — Z0000 Encounter for general adult medical examination without abnormal findings: Secondary | ICD-10-CM

## 2017-06-21 LAB — LIPID PANEL
CHOL/HDL RATIO: 3
Cholesterol: 163 mg/dL (ref 0–200)
HDL: 47.3 mg/dL (ref >39.00)
LDL CALC: 95 mg/dL (ref 0–99)
NONHDL: 115.59
Triglycerides: 105 mg/dL (ref 0.0–149.0)
VLDL: 21 mg/dL (ref 0.0–40.0)

## 2017-06-21 LAB — CBC WITH DIFFERENTIAL/PLATELET
BASOS ABS: 0.1 10*3/uL (ref 0.0–0.1)
Basophils Relative: 1 % (ref 0.0–3.0)
EOS ABS: 0.1 10*3/uL (ref 0.0–0.7)
Eosinophils Relative: 1.4 % (ref 0.0–5.0)
HCT: 36.3 % (ref 36.0–46.0)
Hemoglobin: 11.2 g/dL — ABNORMAL LOW (ref 12.0–15.0)
LYMPHS PCT: 24.9 % (ref 12.0–46.0)
Lymphs Abs: 1.9 10*3/uL (ref 0.7–4.0)
MCHC: 31 g/dL (ref 30.0–36.0)
MCV: 69.6 fl — ABNORMAL LOW (ref 78.0–100.0)
MONOS PCT: 5.8 % (ref 3.0–12.0)
Monocytes Absolute: 0.4 10*3/uL (ref 0.1–1.0)
NEUTROS ABS: 5 10*3/uL (ref 1.4–7.7)
NEUTROS PCT: 66.9 % (ref 43.0–77.0)
PLATELETS: 421 10*3/uL — AB (ref 150.0–400.0)
RBC: 5.2 Mil/uL — AB (ref 3.87–5.11)
RDW: 19.3 % — ABNORMAL HIGH (ref 11.5–15.5)
WBC: 7.5 10*3/uL (ref 4.0–10.5)

## 2017-06-21 LAB — COMPREHENSIVE METABOLIC PANEL
ALK PHOS: 81 U/L (ref 39–117)
ALT: 15 U/L (ref 0–35)
AST: 15 U/L (ref 0–37)
Albumin: 4.3 g/dL (ref 3.5–5.2)
BILIRUBIN TOTAL: 0.4 mg/dL (ref 0.2–1.2)
BUN: 13 mg/dL (ref 6–23)
CO2: 27 meq/L (ref 19–32)
CREATININE: 0.77 mg/dL (ref 0.40–1.20)
Calcium: 9.3 mg/dL (ref 8.4–10.5)
Chloride: 106 mEq/L (ref 96–112)
GFR: 90.38 mL/min (ref >60.00)
GLUCOSE: 94 mg/dL (ref 70–99)
Potassium: 4 mEq/L (ref 3.5–5.1)
Sodium: 140 mEq/L (ref 135–145)
TOTAL PROTEIN: 7.6 g/dL (ref 6.0–8.3)

## 2017-06-21 LAB — TSH: TSH: 1.32 u[IU]/mL (ref 0.35–4.50)

## 2017-06-21 LAB — VITAMIN D 25 HYDROXY (VIT D DEFICIENCY, FRACTURES): VITD: 13.05 ng/mL — AB (ref 30.00–100.00)

## 2017-06-21 NOTE — Progress Notes (Signed)
Patient ID: Tina Fleming, female   DOB: 03/30/1981, 37 y.o.   MRN: [id number]   Subjective:    Patient ID: Tina Fleming, female    DOB: 02/21/1981, [age] y.o.   MRN: [id number]  HPI  Patient here for her physical exam.  She is having her period. Unable to do pap smear.  Still with increased stress as outlined in previous notes.  Still trying to cope with death of her mother and father.  Has had some other family stress as well.  Seeing Ival BibleGary Bailey now.  On no medication.  Off effexor and trazodone.  Overall is able to get out a little more now.  Is back at work.  Sleeping better.  No acid reflux.  Is having problems with headaches.  Saw neurology.  Was prescribed flexeril and instructed to take melatonin.  Does not feel she needs melatonin.  Has not taken flexeril.  Afraid will make her too sleepy.  Still with headaches.  Feels comes up from her neck.  Will have some associated vomiting.  Request rx for zofran.  Planning for f/u with neurology for persistent issues.  Bowels stable.     Past Medical History:  Diagnosis Date  . Abnormal EKG    no cardiology follow  up was needed  . Anemia    iron suppliments in past-not currently  . Anemia   . Heart murmur    asymtomatic  . History of kidney stones   . IBS (irritable bowel syndrome)   . Migraine   . Nephrolithiasis   . Obesity   . PONV (postoperative nausea and vomiting)    prolonged sedation  . Vitamin D deficiency    Past Surgical History:  Procedure Laterality Date  . APPENDECTOMY  03/21/12   laproscopic appy  . DILATATION & CURETTAGE/HYSTEROSCOPY WITH MYOSURE N/A 03/20/2016   Procedure: DILATATION & CURETTAGE/HYSTEROSCOPY WITH MYOSURE;  Surgeon: Maxie BetterSheronette Cousins, MD;  Location: [facility];  Service: Gynecology;  Laterality: N/A;  45 min. requested  . KNEE ARTHROSCOPY  04/24/2011   Procedure: ARTHROSCOPY KNEE;  Surgeon: Javier DockerJeffrey C Beane;  Location: [facility];  Service: Orthopedics;  Laterality: Right;   /Arthroscopy with debridement, right knee  . LAPAROSCOPIC APPENDECTOMY  03/21/2012   Procedure: APPENDECTOMY LAPAROSCOPIC;  Surgeon: Clovis Puhomas A. Cornett, MD;  Location: [facility];  Service: General;  Laterality: N/A;  . LITHOTRIPSY    . URETHRAL STRICTURE DILATATION     as a child   Family History  Problem Relation Age of Onset  . Cancer Mother        pancreatic  . Arthritis Mother   . Hyperlipidemia Mother   . Hypertension Mother   . Cancer Father        skin  . Arthritis Father   . Hyperlipidemia Father   . Hypertension Father   . Diabetes Father   . Ankylosing spondylitis Brother   . Spondylitis Brother    Social History   Socioeconomic History  . Marital status: Single    Spouse name: None  . Number of children: None  . Years of education: None  . Highest education level: None  Social Needs  . Financial resource strain: None  . Food insecurity - worry: None  . Food insecurity - inability: None  . Transportation needs - medical: None  . Transportation needs - non-medical: None  Occupational History  . None  Tobacco Use  . Smoking status: Never Smoker  . Smokeless tobacco: Never Used  Substance and  Sexual Activity  . Alcohol use: No    Alcohol/week: 0.0 oz  . Drug use: No  . Sexual activity: Not Currently    Birth control/protection: Pill  Other Topics Concern  . None  Social History Narrative  . None    Outpatient Encounter Medications as of 06/21/2017  Medication Sig  . ALPRAZolam (XANAX) 0.25 MG tablet Take 1 tablet (0.25 mg total) by mouth 2 (two) times daily as needed for anxiety.  Marland Kitchen aspirin-acetaminophen-caffeine (EXCEDRIN MIGRAINE) 250-250-65 MG tablet Take 1 tablet by mouth every 6 (six) hours as needed for headache or migraine.  . Cholecalciferol (VITAMIN D PO) Take 1 capsule by mouth every morning.  . cyclobenzaprine (FLEXERIL) 5 MG tablet Take 1 tablet by mouth 2 (two) times daily as needed.  . Fe Fum-FePoly-Vit C-Vit B3 (INTEGRA) 62.5-62.5-40-3 MG  CAPS Take 1 tablet by mouth daily.  . Fe Fum-FePoly-Vit C-Vit B3 (INTEGRA) 62.5-62.5-40-3 MG CAPS TAKE 1 CAPSULE BY MOUTH ONCE DAILY  . rizatriptan (MAXALT-MLT) 10 MG disintegrating tablet Take 1 tablet (10 mg total) by mouth as needed for migraine. May repeat in 2 hours if needed  . [DISCONTINUED] traZODone (DESYREL) 50 MG tablet TAKE 1-2 TABLETS BY MOUTH NIGHTLY AS NEEDED FOR SLEEP  . ondansetron (ZOFRAN ODT) 4 MG disintegrating tablet Take 1 tablet (4 mg total) by mouth every 12 (twelve) hours as needed for nausea or vomiting.  . [DISCONTINUED] venlafaxine (EFFEXOR) 75 MG tablet Take 75 mg by mouth daily.   No facility-administered encounter medications on file as of 06/21/2017.     Review of Systems  Constitutional: Negative for appetite change and fever.  HENT: Negative for congestion and sinus pressure.   Eyes: Negative for pain and visual disturbance.  Respiratory: Negative for cough, chest tightness and shortness of breath.   Cardiovascular: Negative for chest pain, palpitations and leg swelling.  Gastrointestinal: Negative for abdominal pain, diarrhea and nausea.       Vomiting associated with headaches.   Genitourinary: Negative for difficulty urinating and dysuria.  Musculoskeletal: Negative for back pain and joint swelling.  Skin: Negative for color change and rash.  Neurological: Positive for headaches. Negative for dizziness and light-headedness.  Hematological: Negative for adenopathy. Does not bruise/bleed easily.  Psychiatric/Behavioral: Negative for agitation and dysphoric mood.       Objective:    Physical Exam  Constitutional: She is oriented to person, place, and time. She appears well-developed and well-nourished. No distress.  HENT:  Nose: Nose normal.  Mouth/Throat: Oropharynx is clear and moist.  Eyes: Right eye exhibits no discharge. Left eye exhibits no discharge. No scleral icterus.  Neck: Neck supple. No thyromegaly present.  Cardiovascular: Normal rate  and regular rhythm.  Pulmonary/Chest: Breath sounds normal. No accessory muscle usage. No tachypnea. No respiratory distress. She has no decreased breath sounds. She has no wheezes. She has no rhonchi. Right breast exhibits no inverted nipple, no mass, no nipple discharge and no tenderness (no axillary adenopathy). Left breast exhibits no inverted nipple, no mass, no nipple discharge and no tenderness (no axilarry adenopathy).  Abdominal: Soft. Bowel sounds are normal. There is no tenderness.  Genitourinary:  Genitourinary Comments: Not performed (pt having her period0.   Musculoskeletal: She exhibits no edema or tenderness.  Lymphadenopathy:    She has no cervical adenopathy.  Neurological: She is alert and oriented to person, place, and time.  Skin: Skin is warm. No rash noted.  Psychiatric: She has a normal mood and affect. Her behavior is normal.  BP 128/68 (BP Location: Left Arm, Patient Position: Sitting, Cuff Size: Large)   Pulse 85   Temp 98.2 F (36.8 C) (Oral)   Resp 18   Wt 241 lb 3.2 oz (109.4 kg)   SpO2 97%   BMI 40.19 kg/m  Wt Readings from Last 3 Encounters:  06/21/17 241 lb 3.2 oz (109.4 kg)  03/18/17 233 lb 9.6 oz (106 kg)  02/02/17 230 lb 3.2 oz (104.4 kg)     Lab Results  Component Value Date   WBC 7.5 06/21/2017   HGB 11.2 (L) 06/21/2017   HCT 36.3 06/21/2017   PLT 421.0 (H) 06/21/2017   GLUCOSE 94 06/21/2017   CHOL 163 06/21/2017   TRIG 105.0 06/21/2017   HDL 47.30 06/21/2017   LDLCALC 95 06/21/2017   ALT 15 06/21/2017   AST 15 06/21/2017   NA 140 06/21/2017   K 4.0 06/21/2017   CL 106 06/21/2017   CREATININE 0.77 06/21/2017   BUN 13 06/21/2017   CO2 27 06/21/2017   TSH 1.32 06/21/2017   INR 1.09 07/14/2016    Mr Brain W Wo Contrast  Result Date: 08/20/2016 CLINICAL DATA:  Migraine headache.  Visual disturbance and vomiting. EXAM: MRI HEAD WITHOUT AND WITH CONTRAST TECHNIQUE: Multiplanar, multiecho pulse sequences of the brain and  surrounding structures were obtained without and with intravenous contrast. CONTRAST:  20mL MULTIHANCE GADOBENATE DIMEGLUMINE 529 MG/ML IV SOLN COMPARISON:  Head CT 07/14/2016 FINDINGS: Brain: The brain has normal appearance without evidence of malformation, atrophy, old or acute small or large vessel infarction, hemorrhage, hydrocephalus or extra-axial collection. No pituitary abnormality. After contrast administration, one can appreciate a small developmental venous anomaly in the left pons, not of any clinical significance. No abnormal enhancement of significance. Vascular: Major vessels at the base of the brain show flow. Skull and upper cervical spine: Normal Sinuses/Orbits: Clear/ normal. Other: None significant. IMPRESSION: Normal examination. No cause of headache identified. 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 Electronically Signed   By: Paulina Fusi M.D.   On: 08/20/2016 10:02       Assessment & Plan:   Problem List Items Addressed This Visit    Anemia    Recheck cbc.       Relevant Orders   CBC with Differential/Platelet (Completed)   Comprehensive metabolic panel (Completed)   TSH (Completed)   Health care maintenance    Physical today 06/21/17.  PAP 02/05/14 - negative with negative HPV.  Unable to do pap today.  Will do next visit.        Migraine    Persistent issues with headaches.  Seeing neurology.  Has not tried flexeril.  Follow.  Plans to f/u with neurology.        Relevant Medications   cyclobenzaprine (FLEXERIL) 5 MG tablet   Stress - Primary    Increased stress as outlined. Seeing Ival Bible.  Getting out more.  Back at work.  Does not feel needs anything more at this time.  Follow.       Vitamin D deficiency    Follow vitamin D level.        Relevant Orders   VITAMIN D 25 Hydroxy (Vit-D Deficiency, Fractures) (Completed)    Other Visit Diagnoses    Screening cholesterol level       Relevant Orders   Lipid  panel (Completed)       Dale [place], MD

## 2017-06-22 ENCOUNTER — Other Ambulatory Visit: Payer: Self-pay | Admitting: Internal Medicine

## 2017-06-22 ENCOUNTER — Encounter: Payer: Self-pay | Admitting: Internal Medicine

## 2017-06-22 ENCOUNTER — Other Ambulatory Visit (INDEPENDENT_AMBULATORY_CARE_PROVIDER_SITE_OTHER): Payer: 59

## 2017-06-22 DIAGNOSIS — D649 Anemia, unspecified: Secondary | ICD-10-CM

## 2017-06-22 LAB — FERRITIN: Ferritin: 4.4 ng/mL — ABNORMAL LOW (ref 10.0–291.0)

## 2017-06-22 MED ORDER — ONDANSETRON 4 MG PO TBDP: 4.0000 mg | ORAL_TABLET | Freq: Two times a day (BID) | ORAL | 0 refills | Status: DC | PRN

## 2017-06-22 MED ORDER — VITAMIN D (ERGOCALCIFEROL) 1.25 MG (50000 UNIT) PO CAPS
50000.0000 [IU] | ORAL_CAPSULE | ORAL | 0 refills | Status: DC
Start: 2017-06-22 — End: 2019-08-21

## 2017-06-22 MED FILL — VIT D2 1.25 MG (50,000 UNIT: 1.25 MG | 56 days supply | Qty: 8 | Fill #0

## 2017-06-22 MED FILL — ONDANSETRON ODT 4 MG TABLET: 4 | 10 days supply | Qty: 20 | Fill #0

## 2017-06-22 NOTE — Assessment & Plan Note (Signed)
Physical today 06/21/17.  PAP 02/05/14 - negative with negative HPV.  Unable to do pap today.  Will do next visit.

## 2017-06-22 NOTE — Assessment & Plan Note (Signed)
Increased stress as outlined. Seeing Ival BibleGary Bailey.  Getting out more.  Back at work.  Does not feel needs anything more at this time.  Follow.

## 2017-06-22 NOTE — Assessment & Plan Note (Signed)
Follow vitamin D level.  

## 2017-06-22 NOTE — Assessment & Plan Note (Signed)
Recheck cbc.  

## 2017-06-22 NOTE — Assessment & Plan Note (Signed)
Persistent issues with headaches.  Seeing neurology.  Has not tried flexeril.  Follow.  Plans to f/u with neurology.

## 2017-06-22 NOTE — Progress Notes (Signed)
Order placed for ferritin 

## 2017-06-22 NOTE — Progress Notes (Signed)
rx sent in for ergocalciferol.  #8 with no refills.   

## 2017-06-23 DIAGNOSIS — F411 Generalized anxiety disorder: Secondary | ICD-10-CM | POA: Diagnosis not present

## 2017-06-30 DIAGNOSIS — F411 Generalized anxiety disorder: Secondary | ICD-10-CM | POA: Diagnosis not present

## 2017-07-16 DIAGNOSIS — G43119 Migraine with aura, intractable, without status migrainosus: Secondary | ICD-10-CM | POA: Diagnosis not present

## 2017-07-22 DIAGNOSIS — F411 Generalized anxiety disorder: Secondary | ICD-10-CM | POA: Diagnosis not present

## 2017-09-22 ENCOUNTER — Ambulatory Visit: Payer: Self-pay | Admitting: Internal Medicine

## 2017-10-08 DIAGNOSIS — F4323 Adjustment disorder with mixed anxiety and depressed mood: Secondary | ICD-10-CM | POA: Diagnosis not present

## 2017-10-16 DIAGNOSIS — F4323 Adjustment disorder with mixed anxiety and depressed mood: Secondary | ICD-10-CM | POA: Diagnosis not present

## 2017-10-25 ENCOUNTER — Other Ambulatory Visit: Payer: Self-pay | Admitting: Internal Medicine

## 2017-10-27 DIAGNOSIS — F4323 Adjustment disorder with mixed anxiety and depressed mood: Secondary | ICD-10-CM | POA: Diagnosis not present

## 2017-10-27 MED FILL — RIZATRIPTAN 10 MG ODT: 10 | 30 days supply | Qty: 10 | Fill #0

## 2017-11-05 ENCOUNTER — Ambulatory Visit: Payer: 59 | Admitting: Internal Medicine

## 2017-11-08 DIAGNOSIS — F4323 Adjustment disorder with mixed anxiety and depressed mood: Secondary | ICD-10-CM | POA: Diagnosis not present

## 2017-11-09 ENCOUNTER — Encounter: Payer: Self-pay | Admitting: Internal Medicine

## 2017-11-15 DIAGNOSIS — G4700 Insomnia, unspecified: Secondary | ICD-10-CM | POA: Diagnosis not present

## 2017-11-15 DIAGNOSIS — M5481 Occipital neuralgia: Secondary | ICD-10-CM | POA: Diagnosis not present

## 2017-11-15 DIAGNOSIS — G43119 Migraine with aura, intractable, without status migrainosus: Secondary | ICD-10-CM | POA: Diagnosis not present

## 2017-11-15 DIAGNOSIS — M542 Cervicalgia: Secondary | ICD-10-CM | POA: Diagnosis not present

## 2017-11-23 DIAGNOSIS — F4323 Adjustment disorder with mixed anxiety and depressed mood: Secondary | ICD-10-CM | POA: Diagnosis not present

## 2017-11-26 ENCOUNTER — Other Ambulatory Visit: Payer: Self-pay | Admitting: Nurse Practitioner

## 2017-11-26 DIAGNOSIS — M5481 Occipital neuralgia: Secondary | ICD-10-CM

## 2017-12-02 ENCOUNTER — Ambulatory Visit
Admission: RE | Admit: 2017-12-02 | Discharge: 2017-12-02 | Disposition: A | Discharge status: Home / Self Care | Payer: 59 | Source: Ambulatory Visit | Attending: Nurse Practitioner | Admitting: Nurse Practitioner

## 2017-12-02 DIAGNOSIS — M47892 Other spondylosis, cervical region: Secondary | ICD-10-CM | POA: Insufficient documentation

## 2017-12-02 DIAGNOSIS — M5481 Occipital neuralgia: Secondary | ICD-10-CM | POA: Diagnosis not present

## 2017-12-02 DIAGNOSIS — E041 Nontoxic single thyroid nodule: Secondary | ICD-10-CM | POA: Diagnosis not present

## 2017-12-02 DIAGNOSIS — M50221 Other cervical disc displacement at C4-C5 level: Secondary | ICD-10-CM | POA: Diagnosis not present

## 2017-12-03 ENCOUNTER — Encounter: Payer: Self-pay | Admitting: Internal Medicine

## 2017-12-10 DIAGNOSIS — F332 Major depressive disorder, recurrent severe without psychotic features: Secondary | ICD-10-CM | POA: Diagnosis not present

## 2017-12-24 DIAGNOSIS — F4323 Adjustment disorder with mixed anxiety and depressed mood: Secondary | ICD-10-CM | POA: Diagnosis not present

## 2018-01-07 DIAGNOSIS — F411 Generalized anxiety disorder: Secondary | ICD-10-CM | POA: Diagnosis not present

## 2018-01-17 DIAGNOSIS — R42 Dizziness and giddiness: Secondary | ICD-10-CM | POA: Diagnosis not present

## 2018-01-17 DIAGNOSIS — M542 Cervicalgia: Secondary | ICD-10-CM | POA: Diagnosis not present

## 2018-01-17 DIAGNOSIS — M5481 Occipital neuralgia: Secondary | ICD-10-CM | POA: Diagnosis not present

## 2018-01-17 DIAGNOSIS — G43119 Migraine with aura, intractable, without status migrainosus: Secondary | ICD-10-CM | POA: Diagnosis not present

## 2018-01-25 ENCOUNTER — Encounter: Payer: Self-pay | Admitting: Internal Medicine

## 2018-01-26 ENCOUNTER — Telehealth: Payer: Self-pay | Admitting: Internal Medicine

## 2018-01-26 NOTE — Telephone Encounter (Signed)
My chart message sent to pt regarding the need for f/u thyroid ultrasound - after review of CT neck.

## 2018-01-27 DIAGNOSIS — F411 Generalized anxiety disorder: Secondary | ICD-10-CM | POA: Diagnosis not present

## 2018-02-14 DIAGNOSIS — G43119 Migraine with aura, intractable, without status migrainosus: Secondary | ICD-10-CM | POA: Diagnosis not present

## 2018-02-18 DIAGNOSIS — F332 Major depressive disorder, recurrent severe without psychotic features: Secondary | ICD-10-CM | POA: Diagnosis not present

## 2018-02-24 ENCOUNTER — Encounter: Payer: Self-pay | Admitting: Internal Medicine

## 2018-02-24 ENCOUNTER — Ambulatory Visit: Payer: 59 | Admitting: Internal Medicine

## 2018-02-24 VITALS — BP 116/78 | HR 71 | Temp 97.8°F | Resp 18 | Wt 233.2 lb

## 2018-02-24 DIAGNOSIS — F439 Reaction to severe stress, unspecified: Secondary | ICD-10-CM | POA: Diagnosis not present

## 2018-02-24 DIAGNOSIS — D649 Anemia, unspecified: Secondary | ICD-10-CM | POA: Diagnosis not present

## 2018-02-24 DIAGNOSIS — G43109 Migraine with aura, not intractable, without status migrainosus: Secondary | ICD-10-CM

## 2018-02-24 DIAGNOSIS — E041 Nontoxic single thyroid nodule: Secondary | ICD-10-CM

## 2018-02-24 DIAGNOSIS — E559 Vitamin D deficiency, unspecified: Secondary | ICD-10-CM | POA: Diagnosis not present

## 2018-02-24 LAB — CBC WITH DIFFERENTIAL/PLATELET
BASOS PCT: 0.9 % (ref 0.0–3.0)
Basophils Absolute: 0.1 10*3/uL (ref 0.0–0.1)
EOS PCT: 1.7 % (ref 0.0–5.0)
Eosinophils Absolute: 0.2 10*3/uL (ref 0.0–0.7)
HEMATOCRIT: 36.4 % (ref 36.0–46.0)
HEMOGLOBIN: 11.2 g/dL — AB (ref 12.0–15.0)
LYMPHS PCT: 21 % (ref 12.0–46.0)
Lymphs Abs: 2 10*3/uL (ref 0.7–4.0)
MCHC: 30.7 g/dL (ref 30.0–36.0)
MCV: 69.3 fl — ABNORMAL LOW (ref 78.0–100.0)
Monocytes Absolute: 0.7 10*3/uL (ref 0.1–1.0)
Monocytes Relative: 6.9 % (ref 3.0–12.0)
Neutro Abs: 6.8 10*3/uL (ref 1.4–7.7)
Neutrophils Relative %: 69.5 % (ref 43.0–77.0)
Platelets: 451 10*3/uL — ABNORMAL HIGH (ref 150.0–400.0)
RBC: 5.26 Mil/uL — ABNORMAL HIGH (ref 3.87–5.11)
RDW: 19.6 % — AB (ref 11.5–15.5)
WBC: 9.8 10*3/uL (ref 4.0–10.5)

## 2018-02-24 LAB — IBC PANEL
Iron: 25 ug/dL — ABNORMAL LOW (ref 42–145)
Saturation Ratios: 4.9 % — ABNORMAL LOW (ref 20.0–50.0)
Transferrin: 365 mg/dL — ABNORMAL HIGH (ref 212.0–360.0)

## 2018-02-24 LAB — VITAMIN D 25 HYDROXY (VIT D DEFICIENCY, FRACTURES): VITD: 18.16 ng/mL — AB (ref 30.00–100.00)

## 2018-02-24 LAB — FERRITIN: Ferritin: 4 ng/mL — ABNORMAL LOW (ref 10.0–291.0)

## 2018-02-24 NOTE — Progress Notes (Signed)
Patient ID: Tina Fleming, female   DOB: May 19, 1981, 37 y.o.   MRN: [id number]   Subjective:    Patient ID: Tina Fleming, female    DOB: [birth year], 37 y.o.   MRN: [id number]  HPI  Patient here for a scheduled follow up.  She was also scheduled for pap.  Having her period.  Unable to do pap today.  Still dealing with increased stress.  Seeing her counselor Thana Ates ([facility]).   This is going well.  Overall she feels she is doing better.  Trying to stay active.  No chest pain.  No sob.  No acid reflux.  No abdominal pain.  Bowels moving.  Still having headaches.  Seeing Dr Sherryll Burger.  S/p nerve block.  Headaches not as intense.  Is exercising.  Off iron.  Did not get refill.  Has been off for months.  Taking a multivitamin.  Has f/u with endocrinology for thyroid nodule.  Has appt at the end of the month.     Past Medical History:  Diagnosis Date  . Abnormal EKG    no cardiology follow  up was needed  . Anemia    iron suppliments in past-not currently  . Anemia   . Heart murmur    asymtomatic  . History of kidney stones   . IBS (irritable bowel syndrome)   . Migraine   . Nephrolithiasis   . Obesity   . PONV (postoperative nausea and vomiting)    prolonged sedation  . Vitamin D deficiency    Past Surgical History:  Procedure Laterality Date  . APPENDECTOMY  03/21/12   laproscopic appy  . DILATATION & CURETTAGE/HYSTEROSCOPY WITH MYOSURE N/A 03/20/2016   Procedure: DILATATION & CURETTAGE/HYSTEROSCOPY WITH MYOSURE;  Surgeon: Maxie Better, MD;  Location: [facility];  Service: Gynecology;  Laterality: N/A;  45 min. requested  . KNEE ARTHROSCOPY  04/24/2011   Procedure: ARTHROSCOPY KNEE;  Surgeon: Javier Docker;  Location: [facility];  Service: Orthopedics;  Laterality: Right;  /Arthroscopy with debridement, right knee  . LAPAROSCOPIC APPENDECTOMY  03/21/2012   Procedure: APPENDECTOMY LAPAROSCOPIC;  Surgeon: Clovis Pu. Cornett, MD;  Location: [facility];   Service: General;  Laterality: N/A;  . LITHOTRIPSY    . URETHRAL STRICTURE DILATATION     as a child   Family History  Problem Relation Age of Onset  . Cancer Mother        pancreatic  . Arthritis Mother   . Hyperlipidemia Mother   . Hypertension Mother   . Cancer Father        skin  . Arthritis Father   . Hyperlipidemia Father   . Hypertension Father   . Diabetes Father   . Ankylosing spondylitis Brother   . Spondylitis Brother    Social History   Socioeconomic History  . Marital status: Single    Spouse name: Not on file  . Number of children: Not on file  . Years of education: Not on file  . Highest education level: Not on file  Occupational History  . Not on file  Social Needs  . Financial resource strain: Not on file  . Food insecurity:    Worry: Not on file    Inability: Not on file  . Transportation needs:    Medical: Not on file    Non-medical: Not on file  Tobacco Use  . Smoking status: Never Smoker  . Smokeless tobacco: Never Used  Substance and Sexual Activity  . Alcohol use:  No    Alcohol/week: 0.0 standard drinks  . Drug use: No  . Sexual activity: Not Currently    Birth control/protection: Pill  Lifestyle  . Physical activity:    Days per week: Not on file    Minutes per session: Not on file  . Stress: Not on file  Relationships  . Social connections:    Talks on phone: Not on file    Gets together: Not on file    Attends religious service: Not on file    Active member of club or organization: Not on file    Attends meetings of clubs or organizations: Not on file    Relationship status: Not on file  Other Topics Concern  . Not on file  Social History Narrative  . Not on file    Outpatient Encounter Medications as of 02/24/2018  Medication Sig  . ALPRAZolam (XANAX) 0.25 MG tablet Take 1 tablet (0.25 mg total) by mouth 2 (two) times daily as needed for anxiety.  Marland Kitchen aspirin-acetaminophen-caffeine (EXCEDRIN MIGRAINE) 250-250-65 MG tablet  Take 1 tablet by mouth every 6 (six) hours as needed for headache or migraine.  . Cholecalciferol (VITAMIN D PO) Take 1 capsule by mouth every morning.  . cyclobenzaprine (FLEXERIL) 5 MG tablet Take 1 tablet by mouth 2 (two) times daily as needed.  . ondansetron (ZOFRAN ODT) 4 MG disintegrating tablet Take 1 tablet (4 mg total) by mouth every 12 (twelve) hours as needed for nausea or vomiting.  . rizatriptan (MAXALT-MLT) 10 MG disintegrating tablet TAKE 1 TABLET BY MOUTH AS NEEDED FOR MIGRAINE. MAY REPEAT IN 2 HOURS IF NEEDED  . Vitamin D, Ergocalciferol, (DRISDOL) 50000 units CAPS capsule Take 1 capsule (50,000 Units total) by mouth every 7 (seven) days.  . [DISCONTINUED] Fe Fum-FePoly-Vit C-Vit B3 (INTEGRA) 62.5-62.5-40-3 MG CAPS Take 1 tablet by mouth daily.  . [DISCONTINUED] Fe Fum-FePoly-Vit C-Vit B3 (INTEGRA) 62.5-62.5-40-3 MG CAPS TAKE 1 CAPSULE BY MOUTH ONCE DAILY   No facility-administered encounter medications on file as of 02/24/2018.     Review of Systems  Constitutional: Negative for appetite change and unexpected weight change.  HENT: Negative for congestion and sinus pressure.   Respiratory: Negative for cough, chest tightness and shortness of breath.   Cardiovascular: Negative for chest pain, palpitations and leg swelling.  Gastrointestinal: Negative for abdominal pain, diarrhea, nausea and vomiting.  Genitourinary: Negative for difficulty urinating and dysuria.  Musculoskeletal: Negative for joint swelling and myalgias.  Skin: Negative for color change and rash.  Neurological: Positive for headaches. Negative for dizziness.  Psychiatric/Behavioral: Negative for agitation.       Increased stress as outlined.  Seeing a Veterinary surgeon.         Objective:     Blood pressure rechecked by me:  116/78  Physical Exam  Constitutional: She appears well-developed and well-nourished. No distress.  HENT:  Nose: Nose normal.  Mouth/Throat: Oropharynx is clear and moist.  Neck: Neck  supple. No thyromegaly present.  Cardiovascular: Normal rate and regular rhythm.  Pulmonary/Chest: Breath sounds normal. No respiratory distress. She has no wheezes.  Abdominal: Soft. Bowel sounds are normal. There is no tenderness.  Musculoskeletal: She exhibits no edema or tenderness.  Lymphadenopathy:    She has no cervical adenopathy.  Skin: No rash noted. No erythema.  Psychiatric: She has a normal mood and affect. Her behavior is normal.    BP 116/78 (BP Location: Left Arm, Patient Position: Sitting, Cuff Size: Normal)   Pulse 71   Temp 97.8 F (  36.6 C) (Oral)   Resp 18   Wt 233 lb 3.2 oz (105.8 kg)   SpO2 97%   BMI 38.85 kg/m  Wt Readings from Last 3 Encounters:  02/24/18 233 lb 3.2 oz (105.8 kg)  06/21/17 241 lb 3.2 oz (109.4 kg)  03/18/17 233 lb 9.6 oz (106 kg)     Lab Results  Component Value Date   WBC 9.8 02/24/2018   HGB 11.2 (L) 02/24/2018   HCT 36.4 02/24/2018   PLT 451.0 (H) 02/24/2018   GLUCOSE 94 06/21/2017   CHOL 163 06/21/2017   TRIG 105.0 06/21/2017   HDL 47.30 06/21/2017   LDLCALC 95 06/21/2017   ALT 15 06/21/2017   AST 15 06/21/2017   NA 140 06/21/2017   K 4.0 06/21/2017   CL 106 06/21/2017   CREATININE 0.77 06/21/2017   BUN 13 06/21/2017   CO2 27 06/21/2017   TSH 1.32 06/21/2017   INR 1.09 07/14/2016    Ct Cervical Spine Wo Contrast  Result Date: 12/03/2017 CLINICAL DATA:  37 year old female with headache symptoms for 2-3 years, progressing. Skull base pain, occipital neuralgia. No upper extremity symptoms and normal neck range of motion. EXAM: CT CERVICAL SPINE WITHOUT CONTRAST TECHNIQUE: Multidetector CT imaging of the cervical spine was performed without intravenous contrast. Multiplanar CT image reconstructions were also generated. COMPARISON:  Brain MRI 08/20/2016. Head CT without contrast 07/14/2016. FINDINGS: Alignment: Straightening and mild reversal of cervical lordosis. Otherwise normal vertebral height and alignment.  Cervicothoracic junction alignment is within normal limits. Bilateral posterior element alignment is within normal limits. Skull base and vertebrae: Visualized skull base is intact. No atlanto-occipital dissociation. Bone mineralization is within normal limits. Intact cervical vertebrae. Visible paranasal sinuses, tympanic cavities and mastoids are clear. Soft tissues and spinal canal: Stable and negative visible brain parenchyma. Negative noncontrast larynx, pharynx, parapharyngeal spaces, retropharyngeal space, visible sublingual space, submandibular spaces and parotid spaces. The posterior scalp and cervical paraspinal soft tissues appear normal. There is a partially calcified 2.3 centimeter hypodense right thyroid nodule (series 4, image 79). The left thyroid lobe and isthmus appear normal. Disc levels: C1-C2: Normal alignment and joint spaces. No degenerative changes about the odontoid. C2-C3:  Negative. C3-C4:  Mild right foraminal endplate spurring.  No stenosis. C4-C5:  Minimal disc bulge.  No stenosis. C5-C6:  Minimal disc bulge and endplate spurring.  No stenosis. C6-C7:  Negative. C7-T1:  Negative. Upper chest: Normal lung apices. Normal visible upper thoracic levels. IMPRESSION: 1. Normal skull base and largely unremarkable for age CT appearance of the cervical spine. Minimal degenerative changes with no spinal or foraminal stenosis. 2. A 2.3 cm right Thyroid Nodule meets consensus criteria for further characterization by Thyroid Ultrasound. 3. Otherwise normal noncontrast neck soft tissues. Electronically Signed   By: Odessa Fleming M.D.   On: 12/03/2017 08:21       Assessment & Plan:   Problem List Items Addressed This Visit    Anemia - Primary    Off iron.  Recheck cbc and ferritin.        Relevant Orders   CBC with Differential/Platelet (Completed)   Ferritin (Completed)   IBC panel (Completed)   Migraine    Followed by neurology.  S/p nerve block.  Headaches not as intense.  Follow.         Stress    Seeing a counselor regularly.  Overall doing better.  Follow.       Thyroid nodule    Right thyroid nodule.  Scheduled to f/u  with endocrinology at the end of this month.        Vitamin D deficiency    Follow vitamin D level.        Relevant Orders   VITAMIN D 25 Hydroxy (Vit-D Deficiency, Fractures) (Completed)       Dale [place], MD

## 2018-02-25 ENCOUNTER — Other Ambulatory Visit: Payer: Self-pay | Admitting: Internal Medicine

## 2018-02-25 DIAGNOSIS — D509 Iron deficiency anemia, unspecified: Secondary | ICD-10-CM

## 2018-02-25 MED ORDER — INTEGRA 62.5-62.5-40-3 MG PO CAPS: 1.0000 | ORAL_CAPSULE | Freq: Every day | ORAL | 2 refills | Status: DC

## 2018-02-25 MED FILL — INTEGRA CAPSULE: 62.5-62.5-4 | 30 days supply | Qty: 30 | Fill #0

## 2018-02-25 NOTE — Progress Notes (Signed)
Order placed for f/u cbc and ferritin.   

## 2018-02-27 ENCOUNTER — Encounter: Payer: Self-pay | Admitting: Internal Medicine

## 2018-02-27 DIAGNOSIS — E041 Nontoxic single thyroid nodule: Secondary | ICD-10-CM | POA: Insufficient documentation

## 2018-02-27 NOTE — Assessment & Plan Note (Signed)
Right thyroid nodule.  Scheduled to f/u with endocrinology at the end of this month.

## 2018-02-27 NOTE — Assessment & Plan Note (Signed)
Follow vitamin D level.  

## 2018-02-27 NOTE — Assessment & Plan Note (Signed)
Seeing a counselor regularly.  Overall doing better.  Follow.

## 2018-02-27 NOTE — Assessment & Plan Note (Signed)
Off iron.  Recheck cbc and ferritin.   

## 2018-02-27 NOTE — Assessment & Plan Note (Signed)
Followed by neurology.  S/p nerve block.  Headaches not as intense.  Follow.

## 2018-03-04 DIAGNOSIS — F332 Major depressive disorder, recurrent severe without psychotic features: Secondary | ICD-10-CM | POA: Diagnosis not present

## 2018-03-14 MED FILL — EMGALITY 120 MG/ML SOSY: 120 | 28 days supply | Qty: 2 | Fill #0

## 2018-03-15 DIAGNOSIS — E041 Nontoxic single thyroid nodule: Secondary | ICD-10-CM | POA: Diagnosis not present

## 2018-03-18 DIAGNOSIS — F4323 Adjustment disorder with mixed anxiety and depressed mood: Secondary | ICD-10-CM | POA: Diagnosis not present

## 2018-03-21 DIAGNOSIS — E041 Nontoxic single thyroid nodule: Secondary | ICD-10-CM | POA: Diagnosis not present

## 2018-03-25 DIAGNOSIS — F4323 Adjustment disorder with mixed anxiety and depressed mood: Secondary | ICD-10-CM | POA: Diagnosis not present

## 2018-03-30 DIAGNOSIS — E041 Nontoxic single thyroid nodule: Secondary | ICD-10-CM | POA: Diagnosis not present

## 2018-03-31 DIAGNOSIS — E041 Nontoxic single thyroid nodule: Secondary | ICD-10-CM | POA: Diagnosis not present

## 2018-04-13 DIAGNOSIS — F4323 Adjustment disorder with mixed anxiety and depressed mood: Secondary | ICD-10-CM | POA: Diagnosis not present

## 2018-04-20 ENCOUNTER — Other Ambulatory Visit (INDEPENDENT_AMBULATORY_CARE_PROVIDER_SITE_OTHER): Payer: 59

## 2018-04-20 DIAGNOSIS — D509 Iron deficiency anemia, unspecified: Secondary | ICD-10-CM | POA: Diagnosis not present

## 2018-04-20 LAB — FERRITIN: Ferritin: 4.9 ng/mL — ABNORMAL LOW (ref 10.0–291.0)

## 2018-04-20 LAB — CBC WITH DIFFERENTIAL/PLATELET
BASOS ABS: 0.1 10*3/uL (ref 0.0–0.1)
BASOS PCT: 1 % (ref 0.0–3.0)
EOS ABS: 0.2 10*3/uL (ref 0.0–0.7)
Eosinophils Relative: 2 % (ref 0.0–5.0)
HEMATOCRIT: 37.3 % (ref 36.0–46.0)
HEMOGLOBIN: 11.5 g/dL — AB (ref 12.0–15.0)
LYMPHS PCT: 22.4 % (ref 12.0–46.0)
Lymphs Abs: 1.8 10*3/uL (ref 0.7–4.0)
MCHC: 31 g/dL (ref 30.0–36.0)
MCV: 70.2 fl — ABNORMAL LOW (ref 78.0–100.0)
Monocytes Absolute: 0.5 10*3/uL (ref 0.1–1.0)
Monocytes Relative: 6.5 % (ref 3.0–12.0)
Neutro Abs: 5.4 10*3/uL (ref 1.4–7.7)
Neutrophils Relative %: 68.1 % (ref 43.0–77.0)
Platelets: 381 10*3/uL (ref 150.0–400.0)
RBC: 5.31 Mil/uL — ABNORMAL HIGH (ref 3.87–5.11)
RDW: 19.8 % — ABNORMAL HIGH (ref 11.5–15.5)
WBC: 8 10*3/uL (ref 4.0–10.5)

## 2018-04-29 DIAGNOSIS — F4323 Adjustment disorder with mixed anxiety and depressed mood: Secondary | ICD-10-CM | POA: Diagnosis not present

## 2018-05-14 DIAGNOSIS — F332 Major depressive disorder, recurrent severe without psychotic features: Secondary | ICD-10-CM | POA: Diagnosis not present

## 2018-05-22 ENCOUNTER — Encounter: Payer: Self-pay | Admitting: Internal Medicine

## 2018-05-25 ENCOUNTER — Telehealth: Payer: 59 | Admitting: Family

## 2018-05-25 DIAGNOSIS — L03039 Cellulitis of unspecified toe: Secondary | ICD-10-CM

## 2018-05-25 DIAGNOSIS — Z8616 Personal history of COVID-19: Secondary | ICD-10-CM

## 2018-05-25 HISTORY — DX: Personal history of COVID-19: Z86.16

## 2018-05-25 MED ORDER — DOXYCYCLINE HYCLATE 100 MG PO TABS: 100.0000 mg | ORAL_TABLET | Freq: Two times a day (BID) | ORAL | 0 refills | Status: DC

## 2018-05-25 NOTE — Progress Notes (Signed)
E Visit for Rash  We are sorry that you are not feeling well. Here is how we plan to help!  Based on what you shared, it looks like you have a paronychia infection of your toe. I have sent in an antibiotic, doxycycline 100 mg twice a day for 10 days. Keep clean and dry. You can also continue soaking your foot.   HOME CARE:   Take cool showers and avoid direct sunlight.  Apply cool compress or wet dressings.  Take a bath in an oatmeal bath.  Sprinkle content of one Aveeno packet under running faucet with comfortably warm water.  Bathe for 15-20 minutes, 1-2 times daily.  Pat dry with a towel. Do not rub the rash.  Use hydrocortisone cream.  Take an antihistamine like Benadryl for widespread rashes that itch.  The adult dose of Benadryl is 25-50 mg by mouth 4 times daily.  Caution:  This type of medication may cause sleepiness.  Do not drink alcohol, drive, or operate dangerous machinery while taking antihistamines.  Do not take these medications if you have prostate enlargement.  Read package instructions thoroughly on all medications that you take.  GET HELP RIGHT AWAY IF:   Symptoms don't go away after treatment.  Severe itching that persists.  If you rash spreads or swells.  If you rash begins to smell.  If it blisters and opens or develops a yellow-brown crust.  You develop a fever.  You have a sore throat.  You become short of breath.  MAKE SURE YOU:  Understand these instructions. Will watch your condition. Will get help right away if you are not doing well or get worse.  Thank you for choosing an e-visit. Your e-visit answers were reviewed by a board certified advanced clinical practitioner to complete your personal care plan. Depending upon the condition, your plan could have included both over the counter or prescription medications. Please review your pharmacy choice. Be sure that the pharmacy you have chosen is open so that you can pick up your prescription  now.  If there is a problem you may message your provider in MyChart to have the prescription routed to another pharmacy. Your safety is important to us. If you have drug allergies check your prescription carefully.  For the next 24 hours, you can use MyChart to ask questions about today's visit, request a non-urgent call back, or ask for a work or school excuse from your e-visit provider. You will get an email in the next two days asking about your experience. I hope that your e-visit has been valuable and will speed your recovery.

## 2018-05-26 MED FILL — DOXYCYCLINE HYCLATE 100 MG: 100 | 10 days supply | Qty: 20 | Fill #0

## 2018-05-26 MED FILL — EMGALITY 120 MG/ML SOSY: 120 | 28 days supply | Qty: 1 | Fill #0

## 2018-05-26 MED FILL — INTEGRA CAPSULE: 62.5-62.5-4 | 30 days supply | Qty: 30 | Fill #1

## 2018-05-26 NOTE — Telephone Encounter (Signed)
Pt had e-visit done and was given doxycycline

## 2018-06-01 DIAGNOSIS — F332 Major depressive disorder, recurrent severe without psychotic features: Secondary | ICD-10-CM | POA: Diagnosis not present

## 2018-06-14 DIAGNOSIS — M542 Cervicalgia: Secondary | ICD-10-CM | POA: Diagnosis not present

## 2018-06-14 DIAGNOSIS — R42 Dizziness and giddiness: Secondary | ICD-10-CM | POA: Diagnosis not present

## 2018-06-14 DIAGNOSIS — M5481 Occipital neuralgia: Secondary | ICD-10-CM | POA: Diagnosis not present

## 2018-06-14 DIAGNOSIS — G43119 Migraine with aura, intractable, without status migrainosus: Secondary | ICD-10-CM | POA: Diagnosis not present

## 2018-06-14 MED FILL — METHOCARBAMOL 500 MG TABS: 500 | 30 days supply | Qty: 30 | Fill #0

## 2018-06-17 DIAGNOSIS — F332 Major depressive disorder, recurrent severe without psychotic features: Secondary | ICD-10-CM | POA: Diagnosis not present

## 2018-07-08 DIAGNOSIS — F332 Major depressive disorder, recurrent severe without psychotic features: Secondary | ICD-10-CM | POA: Diagnosis not present

## 2018-07-12 ENCOUNTER — Encounter: Payer: Self-pay | Admitting: Internal Medicine

## 2018-07-12 ENCOUNTER — Other Ambulatory Visit (HOSPITAL_COMMUNITY)
Admission: RE | Admit: 2018-07-12 | Discharge: 2018-07-12 | Disposition: A | Discharge status: Home / Self Care | Payer: 59 | Source: Ambulatory Visit | Attending: Internal Medicine | Admitting: Internal Medicine

## 2018-07-12 ENCOUNTER — Ambulatory Visit (INDEPENDENT_AMBULATORY_CARE_PROVIDER_SITE_OTHER): Payer: 59 | Admitting: Internal Medicine

## 2018-07-12 VITALS — BP 120/70 | HR 64 | Temp 97.7°F | Wt 234.0 lb

## 2018-07-12 DIAGNOSIS — R0981 Nasal congestion: Secondary | ICD-10-CM | POA: Diagnosis not present

## 2018-07-12 DIAGNOSIS — Z124 Encounter for screening for malignant neoplasm of cervix: Secondary | ICD-10-CM | POA: Insufficient documentation

## 2018-07-12 DIAGNOSIS — D509 Iron deficiency anemia, unspecified: Secondary | ICD-10-CM | POA: Diagnosis not present

## 2018-07-12 DIAGNOSIS — Z0000 Encounter for general adult medical examination without abnormal findings: Secondary | ICD-10-CM

## 2018-07-12 DIAGNOSIS — R5383 Other fatigue: Secondary | ICD-10-CM | POA: Diagnosis not present

## 2018-07-12 DIAGNOSIS — G43109 Migraine with aura, not intractable, without status migrainosus: Secondary | ICD-10-CM

## 2018-07-12 DIAGNOSIS — Z1239 Encounter for other screening for malignant neoplasm of breast: Secondary | ICD-10-CM | POA: Diagnosis not present

## 2018-07-12 DIAGNOSIS — Z13220 Encounter for screening for lipoid disorders: Secondary | ICD-10-CM

## 2018-07-12 DIAGNOSIS — F439 Reaction to severe stress, unspecified: Secondary | ICD-10-CM

## 2018-07-12 DIAGNOSIS — E041 Nontoxic single thyroid nodule: Secondary | ICD-10-CM

## 2018-07-12 LAB — COMPREHENSIVE METABOLIC PANEL
ALT: 18 U/L (ref 0–35)
AST: 17 U/L (ref 0–37)
Albumin: 4.5 g/dL (ref 3.5–5.2)
Alkaline Phosphatase: 88 U/L (ref 39–117)
BUN: 14 mg/dL (ref 6–23)
CHLORIDE: 102 meq/L (ref 96–112)
CO2: 27 mEq/L (ref 19–32)
Calcium: 9.9 mg/dL (ref 8.4–10.5)
Creatinine, Ser: 0.82 mg/dL (ref 0.40–1.20)
GFR: 78.61 mL/min (ref >60.00)
GLUCOSE: 87 mg/dL (ref 70–99)
POTASSIUM: 3.9 meq/L (ref 3.5–5.1)
Sodium: 137 mEq/L (ref 135–145)
Total Bilirubin: 0.5 mg/dL (ref 0.2–1.2)
Total Protein: 8.1 g/dL (ref 6.0–8.3)

## 2018-07-12 LAB — CBC WITH DIFFERENTIAL/PLATELET
BASOS PCT: 0.6 % (ref 0.0–3.0)
Basophils Absolute: 0 10*3/uL (ref 0.0–0.1)
EOS PCT: 2.2 % (ref 0.0–5.0)
Eosinophils Absolute: 0.2 10*3/uL (ref 0.0–0.7)
HCT: 40 % (ref 36.0–46.0)
Hemoglobin: 12.4 g/dL (ref 12.0–15.0)
LYMPHS PCT: 25.8 % (ref 12.0–46.0)
Lymphs Abs: 2 10*3/uL (ref 0.7–4.0)
MCHC: 31 g/dL (ref 30.0–36.0)
MCV: 71.9 fl — ABNORMAL LOW (ref 78.0–100.0)
MONO ABS: 0.4 10*3/uL (ref 0.1–1.0)
Monocytes Relative: 5.7 % (ref 3.0–12.0)
NEUTROS PCT: 65.7 % (ref 43.0–77.0)
Neutro Abs: 5.1 10*3/uL (ref 1.4–7.7)
Platelets: 416 10*3/uL — ABNORMAL HIGH (ref 150.0–400.0)
RBC: 5.57 Mil/uL — ABNORMAL HIGH (ref 3.87–5.11)
RDW: 19.6 % — AB (ref 11.5–15.5)
WBC: 7.7 10*3/uL (ref 4.0–10.5)

## 2018-07-12 LAB — LIPID PANEL
CHOL/HDL RATIO: 4
Cholesterol: 191 mg/dL (ref 0–200)
HDL: 45.9 mg/dL (ref >39.00)
LDL CALC: 109 mg/dL — AB (ref 0–99)
NonHDL: 144.82
TRIGLYCERIDES: 177 mg/dL — AB (ref 0.0–149.0)
VLDL: 35.4 mg/dL (ref 0.0–40.0)

## 2018-07-12 LAB — TSH: TSH: 2.6 u[IU]/mL (ref 0.35–4.50)

## 2018-07-12 LAB — FERRITIN: FERRITIN: 6.3 ng/mL — AB (ref 10.0–291.0)

## 2018-07-12 MED ORDER — INTEGRA 62.5-62.5-40-3 MG PO CAPS: 1.0000 | ORAL_CAPSULE | Freq: Every day | ORAL | 2 refills | Status: DC

## 2018-07-12 NOTE — Assessment & Plan Note (Signed)
Physical today 07/12/18.  PAP 07/12/18.

## 2018-07-12 NOTE — Progress Notes (Signed)
Patient ID: Tina Fleming, female   DOB: 1981-04-26, 38 y.o.   MRN: [id number]   Subjective:    Patient ID: Tina Fleming, female    DOB: 1981-03-08, 38 y.o.   MRN: [id number]  HPI  Patient here for her physical exam.   Had f/u with neurology 06/14/18.  They are following her for her migraine headaches and occipital neuralgia.  Receiving emgality injections.  Uses maxalt prn.  Still with persistent headaches and will have migraine 1-2x/month.  Will experience intermittent left ear pain.  States feels like sinus issues.  Increased pressure and post nasal drainage.  Uses nasal spray and takes claritin.  Still feels has persistent issues.  Breathing stable.  No chest congestion or sob.  No chest pain.  No acid reflux.  No abdominal pain.  Bowels stable.  Overall still with increased stress trying to cope with her parents deaths.  Does not feel needs any further intervention at this time.     Past Medical History:  Diagnosis Date  . Abnormal EKG    no cardiology follow  up was needed  . Anemia    iron suppliments in past-not currently  . Anemia   . Heart murmur    asymtomatic  . History of kidney stones   . IBS (irritable bowel syndrome)   . Migraine   . Nephrolithiasis   . Obesity   . PONV (postoperative nausea and vomiting)    prolonged sedation  . Vitamin D deficiency    Past Surgical History:  Procedure Laterality Date  . APPENDECTOMY  03/21/12   laproscopic appy  . DILATATION & CURETTAGE/HYSTEROSCOPY WITH MYOSURE N/A 03/20/2016   Procedure: [place];  Surgeon: Servando Salina, MD;  Location: [facility];  Service: Gynecology;  Laterality: N/A;  45 min. requested  . KNEE ARTHROSCOPY  04/24/2011   Procedure: ARTHROSCOPY KNEE;  Surgeon: Johnn Hai;  Location: [facility];  Service: Orthopedics;  Laterality: Right;  /Arthroscopy with debridement, right knee  . LAPAROSCOPIC APPENDECTOMY  03/21/2012   Procedure: APPENDECTOMY  LAPAROSCOPIC;  Surgeon: Joyice Faster. Cornett, MD;  Location: [facility];  Service: General;  Laterality: N/A;  . LITHOTRIPSY    . URETHRAL STRICTURE DILATATION     as a child   Family History  Problem Relation Age of Onset  . Cancer Mother        pancreatic  . Arthritis Mother   . Hyperlipidemia Mother   . Hypertension Mother   . Cancer Father        skin  . Arthritis Father   . Hyperlipidemia Father   . Hypertension Father   . Diabetes Father   . Ankylosing spondylitis Brother   . Spondylitis Brother    Social History   Socioeconomic History  . Marital status: Single    Spouse name: Not on file  . Number of children: Not on file  . Years of education: Not on file  . Highest education level: Not on file  Occupational History  . Not on file  Social Needs  . Financial resource strain: Not on file  . Food insecurity:    Worry: Not on file    Inability: Not on file  . Transportation needs:    Medical: Not on file    Non-medical: Not on file  Tobacco Use  . Smoking status: Never Smoker  . Smokeless tobacco: Never Used  Substance and Sexual Activity  . Alcohol use: No    Alcohol/week: 0.0  standard drinks  . Drug use: No  . Sexual activity: Not Currently    Birth control/protection: Pill  Lifestyle  . Physical activity:    Days per week: Not on file    Minutes per session: Not on file  . Stress: Not on file  Relationships  . Social connections:    Talks on phone: Not on file    Gets together: Not on file    Attends religious service: Not on file    Active member of club or organization: Not on file    Attends meetings of clubs or organizations: Not on file    Relationship status: Not on file  Other Topics Concern  . Not on file  Social History Narrative  . Not on file    Outpatient Encounter Medications as of 07/12/2018  Medication Sig  . ALPRAZolam (XANAX) 0.25 MG tablet Take 1 tablet (0.25 mg total) by mouth 2 (two) times daily as needed for anxiety.  Marland Kitchen  aspirin-acetaminophen-caffeine (EXCEDRIN MIGRAINE) 250-250-65 MG tablet Take 1 tablet by mouth every 6 (six) hours as needed for headache or migraine.  . Cholecalciferol (VITAMIN D PO) Take 1 capsule by mouth every morning.  . cyclobenzaprine (FLEXERIL) 5 MG tablet Take 1 tablet by mouth 2 (two) times daily as needed.  . doxycycline (VIBRA-TABS) 100 MG tablet Take 1 tablet (100 mg total) by mouth 2 (two) times daily.  . Fe Fum-FePoly-Vit C-Vit B3 (INTEGRA) 62.5-62.5-40-3 MG CAPS Take 1 capsule by mouth daily.  . ondansetron (ZOFRAN ODT) 4 MG disintegrating tablet Take 1 tablet (4 mg total) by mouth every 12 (twelve) hours as needed for nausea or vomiting.  . rizatriptan (MAXALT-MLT) 10 MG disintegrating tablet TAKE 1 TABLET BY MOUTH AS NEEDED FOR MIGRAINE. MAY REPEAT IN 2 HOURS IF NEEDED  . Vitamin D, Ergocalciferol, (DRISDOL) 50000 units CAPS capsule Take 1 capsule (50,000 Units total) by mouth every 7 (seven) days.  . [DISCONTINUED] Fe Fum-FePoly-Vit C-Vit B3 (INTEGRA) 62.5-62.5-40-3 MG CAPS Take 1 capsule by mouth daily.   No facility-administered encounter medications on file as of 07/12/2018.     Review of Systems  Constitutional: Negative for appetite change and unexpected weight change.  HENT: Positive for congestion, postnasal drip and sinus pressure.   Eyes: Negative for pain and visual disturbance.  Respiratory: Negative for cough, chest tightness and shortness of breath.   Cardiovascular: Negative for chest pain, palpitations and leg swelling.  Gastrointestinal: Negative for abdominal pain, diarrhea, nausea and vomiting.  Genitourinary: Negative for difficulty urinating and dysuria.  Musculoskeletal: Negative for joint swelling and myalgias.  Skin: Negative for color change and rash.  Neurological: Negative for dizziness.       Had fullness and headache as outlined.    Hematological: Negative for adenopathy. Does not bruise/bleed easily.  Psychiatric/Behavioral: Negative for  agitation.       Increased stress as outlined.         Objective:    Physical Exam Constitutional:      General: She is not in acute distress.    Appearance: Normal appearance. She is well-developed.  HENT:     Nose: Nose normal. No congestion.     Mouth/Throat:     Pharynx: No oropharyngeal exudate or posterior oropharyngeal erythema.  Eyes:     General: No scleral icterus.       Right eye: No discharge.        Left eye: No discharge.  Neck:     Musculoskeletal: Neck supple. No muscular tenderness.  Thyroid: No thyromegaly.  Cardiovascular:     Rate and Rhythm: Normal rate and regular rhythm.  Pulmonary:     Effort: No tachypnea, accessory muscle usage or respiratory distress.     Breath sounds: Normal breath sounds. No decreased breath sounds or wheezing.  Chest:     Breasts:        Right: No inverted nipple, mass, nipple discharge or tenderness (no axillary adenopathy).        Left: No inverted nipple, mass, nipple discharge or tenderness (no axilarry adenopathy).  Abdominal:     General: Bowel sounds are normal.     Palpations: Abdomen is soft.     Tenderness: There is no abdominal tenderness.  Genitourinary:    Comments: Normal external genitalia.  Vaginal vault without lesions.  Cervix identified.  Pap smear performed.  Could not appreciate any adnexal masses or tenderness.   Musculoskeletal:        General: No swelling or tenderness.  Lymphadenopathy:     Cervical: No cervical adenopathy.  Skin:    General: Skin is warm.     Findings: No erythema or rash.  Neurological:     Mental Status: She is alert and oriented to person, place, and time.  Psychiatric:        Mood and Affect: Mood normal.        Behavior: Behavior normal.     BP 120/70   Pulse 64   Temp 97.7 F (36.5 C) (Oral)   Wt 234 lb (106.1 kg)   LMP 07/06/2018 (Exact Date)   SpO2 97%   BMI 38.99 kg/m  Wt Readings from Last 3 Encounters:  07/12/18 234 lb (106.1 kg)  02/24/18 233 lb 3.2  oz (105.8 kg)  06/21/17 241 lb 3.2 oz (109.4 kg)     Lab Results  Component Value Date   WBC 7.7 07/12/2018   HGB 12.4 07/12/2018   HCT 40.0 07/12/2018   PLT 416.0 (H) 07/12/2018   GLUCOSE 87 07/12/2018   CHOL 191 07/12/2018   TRIG 177.0 (H) 07/12/2018   HDL 45.90 07/12/2018   LDLCALC 109 (H) 07/12/2018   ALT 18 07/12/2018   AST 17 07/12/2018   NA 137 07/12/2018   K 3.9 07/12/2018   CL 102 07/12/2018   CREATININE 0.82 07/12/2018   BUN 14 07/12/2018   CO2 27 07/12/2018   TSH 2.60 07/12/2018   INR 1.09 07/14/2016    Ct Cervical Spine Wo Contrast  Result Date: 12/03/2017 CLINICAL DATA:  38 year old female with headache symptoms for 2-3 years, progressing. Skull base pain, occipital neuralgia. No upper extremity symptoms and normal neck range of motion. EXAM: CT CERVICAL SPINE WITHOUT CONTRAST TECHNIQUE: Multidetector CT imaging of the cervical spine was performed without intravenous contrast. Multiplanar CT image reconstructions were also generated. COMPARISON:  Brain MRI 08/20/2016. Head CT without contrast 07/14/2016. FINDINGS: Alignment: Straightening and mild reversal of cervical lordosis. Otherwise normal vertebral height and alignment. Cervicothoracic junction alignment is within normal limits. Bilateral posterior element alignment is within normal limits. Skull base and vertebrae: Visualized skull base is intact. No atlanto-occipital dissociation. Bone mineralization is within normal limits. Intact cervical vertebrae. Visible paranasal sinuses, tympanic cavities and mastoids are clear. Soft tissues and spinal canal: Stable and negative visible brain parenchyma. Negative noncontrast larynx, pharynx, parapharyngeal spaces, retropharyngeal space, visible sublingual space, submandibular spaces and parotid spaces. The posterior scalp and cervical paraspinal soft tissues appear normal. There is a partially calcified 2.3 centimeter hypodense right thyroid nodule (series 4, image  79). The  left thyroid lobe and isthmus appear normal. Disc levels: C1-C2: Normal alignment and joint spaces. No degenerative changes about the odontoid. C2-C3:  Negative. C3-C4:  Mild right foraminal endplate spurring.  No stenosis. C4-C5:  Minimal disc bulge.  No stenosis. C5-C6:  Minimal disc bulge and endplate spurring.  No stenosis. C6-C7:  Negative. C7-T1:  Negative. Upper chest: Normal lung apices. Normal visible upper thoracic levels. IMPRESSION: 1. Normal skull base and largely unremarkable for age CT appearance of the cervical spine. Minimal degenerative changes with no spinal or foraminal stenosis. 2. A 2.3 cm right Thyroid Nodule meets consensus criteria for further characterization by Thyroid Ultrasound. 3. Otherwise normal noncontrast neck soft tissues. Electronically Signed   By: Genevie Ann M.D.   On: 12/03/2017 08:21       Assessment & Plan:   Problem List Items Addressed This Visit    Anemia    Recheck cbc and ferritin.  Has required iron supplementation in the past.        Relevant Medications   Fe Fum-FePoly-Vit C-Vit B3 (INTEGRA) 62.5-62.5-40-3 MG CAPS   Other Relevant Orders   Ferritin (Completed)   Fatigue    Increased fatigue.  Feel is probably multifactorial.  Discussed her stress, etc.  Also discussed exercise. Follow.  Check cbc, met c and tsh.        Relevant Orders   CBC with Differential/Platelet (Completed)   Comprehensive metabolic panel (Completed)   Health care maintenance    Physical today 07/12/18.  PAP 07/12/18.        Migraine    Followed by neurology.        Stress    Increased stress as outlined.  Seeing a Social worker.  Does not feel needs anything more at this time.  Follow.        Thyroid nodule - Primary    Saw Dr Gabriel Carina 02/2018.  S/p biopsy.  Recommended f/u with endocrinology in 6 months (last seen 03/2018).        Relevant Orders   TSH (Completed)    Other Visit Diagnoses    Screening cholesterol level       Relevant Orders   Lipid panel  (Completed)   Breast cancer screening       Relevant Orders   MM 3D SCREEN BREAST BILATERAL   Sinus congestion       Relevant Orders   Ambulatory referral to ENT   Cervical cancer screening       Relevant Orders   Cytology - PAP( [place]) (Completed)       Einar Pheasant, MD

## 2018-07-13 LAB — CYTOLOGY - PAP
DIAGNOSIS: NEGATIVE
HPV: NOT DETECTED

## 2018-07-15 ENCOUNTER — Encounter: Payer: Self-pay | Admitting: Internal Medicine

## 2018-07-15 NOTE — Assessment & Plan Note (Signed)
Increased fatigue.  Feel is probably multifactorial.  Discussed her stress, etc.  Also discussed exercise. Follow.  Check cbc, met c and tsh.

## 2018-07-15 NOTE — Assessment & Plan Note (Signed)
Recheck cbc and ferritin.  Has required iron supplementation in the past.

## 2018-07-15 NOTE — Assessment & Plan Note (Signed)
Saw Dr Tedd Sias 02/2018.  S/p biopsy.  Recommended f/u with endocrinology in 6 months (last seen 03/2018).

## 2018-07-15 NOTE — Assessment & Plan Note (Signed)
Followed by neurology.   

## 2018-07-15 NOTE — Assessment & Plan Note (Signed)
Increased stress as outlined.  Seeing a Veterinary surgeon.  Does not feel needs anything more at this time.  Follow.

## 2018-07-29 ENCOUNTER — Telehealth: Payer: Self-pay

## 2018-07-29 DIAGNOSIS — F411 Generalized anxiety disorder: Secondary | ICD-10-CM | POA: Diagnosis not present

## 2018-07-29 DIAGNOSIS — D509 Iron deficiency anemia, unspecified: Secondary | ICD-10-CM

## 2018-07-29 NOTE — Telephone Encounter (Signed)
No other labs needed

## 2018-07-29 NOTE — Addendum Note (Signed)
Addended by: Larry Sierras on: 07/29/2018 10:06 AM   Modules accepted: Orders

## 2018-07-29 NOTE — Telephone Encounter (Signed)
Copied from CRM [id number]. Topic: General - Inquiry >> Jul 29, 2018  8:36 AM Fanny Bien wrote: Reason for CRM: pt called and stated that she would like to schedule a lab for a recheck. Did not see any orders in the system. Would like to know if she just needs labs or an OV appointment.

## 2018-07-29 NOTE — Telephone Encounter (Signed)
Scheduled pt for lab appt. Per last lab note, it says recheck platelet count in 2-3 weeks. I have ordered a CBC future. Is there anything else you want to add?

## 2018-08-01 ENCOUNTER — Ambulatory Visit
Admission: RE | Admit: 2018-08-01 | Discharge: 2018-08-01 | Disposition: A | Discharge status: Home / Self Care | Payer: 59 | Source: Ambulatory Visit | Attending: Internal Medicine | Admitting: Internal Medicine

## 2018-08-01 DIAGNOSIS — Z1231 Encounter for screening mammogram for malignant neoplasm of breast: Secondary | ICD-10-CM | POA: Diagnosis not present

## 2018-08-01 DIAGNOSIS — Z1239 Encounter for other screening for malignant neoplasm of breast: Secondary | ICD-10-CM

## 2018-08-05 ENCOUNTER — Other Ambulatory Visit (INDEPENDENT_AMBULATORY_CARE_PROVIDER_SITE_OTHER): Payer: 59

## 2018-08-05 ENCOUNTER — Other Ambulatory Visit: Payer: Self-pay

## 2018-08-05 DIAGNOSIS — D509 Iron deficiency anemia, unspecified: Secondary | ICD-10-CM

## 2018-08-05 LAB — CBC WITH DIFFERENTIAL/PLATELET
Basophils Absolute: 0 10*3/uL (ref 0.0–0.1)
Basophils Relative: 0.5 % (ref 0.0–3.0)
Eosinophils Absolute: 0.1 10*3/uL (ref 0.0–0.7)
Eosinophils Relative: 1.3 % (ref 0.0–5.0)
HCT: 34.9 % — ABNORMAL LOW (ref 36.0–46.0)
Hemoglobin: 11 g/dL — ABNORMAL LOW (ref 12.0–15.0)
Lymphocytes Relative: 23.5 % (ref 12.0–46.0)
Lymphs Abs: 2 10*3/uL (ref 0.7–4.0)
MCHC: 31.6 g/dL (ref 30.0–36.0)
MCV: 70.9 fl — AB (ref 78.0–100.0)
MONOS PCT: 4.5 % (ref 3.0–12.0)
Monocytes Absolute: 0.4 10*3/uL (ref 0.1–1.0)
Neutro Abs: 5.9 10*3/uL (ref 1.4–7.7)
Neutrophils Relative %: 70.2 % (ref 43.0–77.0)
Platelets: 383 10*3/uL (ref 150.0–400.0)
RBC: 4.92 Mil/uL (ref 3.87–5.11)
RDW: 18.3 % — ABNORMAL HIGH (ref 11.5–15.5)
WBC: 8.4 10*3/uL (ref 4.0–10.5)

## 2018-08-10 ENCOUNTER — Other Ambulatory Visit: Payer: Self-pay | Admitting: Physician Assistant

## 2018-08-10 ENCOUNTER — Ambulatory Visit
Admission: RE | Admit: 2018-08-10 | Discharge: 2018-08-10 | Disposition: A | Discharge status: Home / Self Care | Payer: 59 | Attending: Physician Assistant | Admitting: Physician Assistant

## 2018-08-10 ENCOUNTER — Ambulatory Visit
Admission: RE | Admit: 2018-08-10 | Discharge: 2018-08-10 | Disposition: A | Discharge status: Home / Self Care | Payer: 59 | Source: Ambulatory Visit | Attending: Physician Assistant | Admitting: Physician Assistant

## 2018-08-10 ENCOUNTER — Other Ambulatory Visit: Payer: Self-pay

## 2018-08-10 DIAGNOSIS — R51 Headache: Secondary | ICD-10-CM | POA: Diagnosis not present

## 2018-08-10 DIAGNOSIS — J019 Acute sinusitis, unspecified: Secondary | ICD-10-CM

## 2018-08-10 DIAGNOSIS — J309 Allergic rhinitis, unspecified: Secondary | ICD-10-CM | POA: Diagnosis not present

## 2018-08-18 ENCOUNTER — Other Ambulatory Visit: Payer: Self-pay | Admitting: Internal Medicine

## 2018-08-19 ENCOUNTER — Encounter: Payer: Self-pay | Admitting: Internal Medicine

## 2018-08-19 MED ORDER — POLYSACCHARIDE IRON COMPLEX 150 MG PO CAPS: 150.0000 mg | ORAL_CAPSULE | Freq: Every day | ORAL | 2 refills | Status: DC

## 2018-08-19 MED FILL — RIZATRIPTAN 10 MG ODT: 10 | 30 days supply | Qty: 10 | Fill #0

## 2018-08-19 MED FILL — FERREX 150 CAPSULE: 150 | 30 days supply | Qty: 30 | Fill #0

## 2018-08-19 MED FILL — EMGALITY 120 MG/ML SOSY: 120 | 28 days supply | Qty: 1 | Fill #0

## 2018-08-19 NOTE — Telephone Encounter (Signed)
rx sent in for ferrex.

## 2018-08-25 ENCOUNTER — Encounter: Payer: Self-pay | Admitting: Internal Medicine

## 2018-08-26 ENCOUNTER — Encounter: Payer: Self-pay | Admitting: Internal Medicine

## 2018-08-26 ENCOUNTER — Ambulatory Visit (INDEPENDENT_AMBULATORY_CARE_PROVIDER_SITE_OTHER): Payer: 59 | Admitting: Internal Medicine

## 2018-08-26 DIAGNOSIS — J069 Acute upper respiratory infection, unspecified: Secondary | ICD-10-CM | POA: Diagnosis not present

## 2018-08-26 MED ORDER — CEFDINIR 300 MG PO CAPS: 300.0000 mg | ORAL_CAPSULE | Freq: Two times a day (BID) | ORAL | 0 refills | Status: DC

## 2018-08-26 NOTE — Progress Notes (Signed)
Patient ID: Tina Fleming, female   DOB: 20-Jul-1980, 38 y.o.   MRN: [id number] Virtual Visit via Video Note  I connected with Tina Fleming on 08/26/18 at  8:30 AM EDT by a video enabled telemedicine application and verified that I am speaking with the correct person using two identifiers.  Location patient: home Location provider:work Persons participating in the virtual visit: patient, provider  I discussed the limitations of evaluation and management by telemedicine.  This visit type was conducted due to national recommendations for restrictions regarding the COVID-19 pandemic.  This format is felt to be most appropriate for this patient at this time.   The patient expressed understanding and agreed to proceed.   HPI: This is a work in visit.  Has had issues with sinus congestion/pressure and left ear issues.  See last note.  Saw ENT.  S/p laryngoscopy.  No obstruction. Had paranasal sinus xray.  Clear.  Discussed allergy testing.  Has not been arranged yet.  Over the last 2-3 weeks, symptoms have worsened.  Increased cough.  Now productive of green/yellow mucus.  No fever.  No sob.  No chest tightness.  No vomiting or diarrhea.  Some acid reflux - previously.  None recently.  Eating and drinking.  Taking flonase and claritin.  Does work at the hospital.  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     ROS: See pertinent positives and negatives per HPI.   Past Medical History:  Diagnosis Date  . Abnormal EKG    no cardiology follow  up was needed  . Anemia    iron suppliments in past-not currently  . Anemia   . Heart murmur    asymtomatic  . History of kidney stones   . IBS (irritable bowel syndrome)   . Migraine   . Nephrolithiasis   . Obesity   . PONV (postoperative nausea and vomiting)    prolonged sedation  . Vitamin D deficiency     Past Surgical History:  Procedure Laterality Date  . APPENDECTOMY  03/21/12   laproscopic appy   . DILATATION & CURETTAGE/HYSTEROSCOPY WITH MYOSURE N/A 03/20/2016   Procedure: DILATATION & CURETTAGE/HYSTEROSCOPY WITH MYOSURE;  Surgeon: Maxie Better, MD;  Location: [facility];  Service: Gynecology;  Laterality: N/A;  45 min. requested  . KNEE ARTHROSCOPY  04/24/2011   Procedure: ARTHROSCOPY KNEE;  Surgeon: Javier Docker;  Location: [facility];  Service: Orthopedics;  Laterality: Right;  /Arthroscopy with debridement, right knee  . LAPAROSCOPIC APPENDECTOMY  03/21/2012   Procedure: APPENDECTOMY LAPAROSCOPIC;  Surgeon: Clovis Pu. Cornett, MD;  Location: [facility];  Service: General;  Laterality: N/A;  . LITHOTRIPSY    . URETHRAL STRICTURE DILATATION     as a child    Family History  Problem Relation Age of Onset  . Cancer Mother        pancreatic  . Arthritis Mother   . Hyperlipidemia Mother   . Hypertension Mother   . Cancer Father        skin  . Arthritis Father   . Hyperlipidemia Father   . Hypertension Father   . Diabetes Father   . Ankylosing spondylitis Brother   . Spondylitis Brother   . Breast cancer Paternal Aunt        50-60  . Breast cancer Paternal Aunt        50-60's    SOCIAL HX: reviewed.    Current Outpatient Medications:  .  ALPRAZolam (XANAX) 0.25 MG tablet, Take 1 tablet (0.25 mg total) by mouth 2 (two) times daily as needed for anxiety., Disp: 20 tablet, Rfl: 0 .  aspirin-acetaminophen-caffeine (EXCEDRIN MIGRAINE) 250-250-65 MG tablet, Take 1 tablet by mouth every 6 (six) hours as needed for headache or migraine., Disp: , Rfl:  .  cefdinir (OMNICEF) 300 MG capsule, Take 1 capsule (300 mg total) by mouth 2 (two) times daily., Disp: 20 capsule, Rfl: 0 .  Cholecalciferol (VITAMIN D PO), Take 1 capsule by mouth every morning., Disp: , Rfl:  .  cyclobenzaprine (FLEXERIL) 5 MG tablet, Take 1 tablet by mouth 2 (two) times daily as needed., Disp: , Rfl:  .  iron polysaccharides (FERREX 150) 150 MG capsule, Take 1 capsule (150 mg total) by mouth  daily., Disp: 30 capsule, Rfl: 2 .  ondansetron (ZOFRAN ODT) 4 MG disintegrating tablet, Take 1 tablet (4 mg total) by mouth every 12 (twelve) hours as needed for nausea or vomiting., Disp: 20 tablet, Rfl: 0 .  rizatriptan (MAXALT-MLT) 10 MG disintegrating tablet, TAKE 1 TABLET BY MOUTH AS NEEDED FOR MIGRAINE. MAY REPEAT IN 2 HOURS IF NEEDED, Disp: 10 tablet, Rfl: 0 .  Vitamin D, Ergocalciferol, (DRISDOL) 50000 units CAPS capsule, Take 1 capsule (50,000 Units total) by mouth every 7 (seven) days., Disp: 8 capsule, Rfl: 0  EXAM:  GENERAL: alert, oriented, appears well and in no acute distress  HEENT: atraumatic, conjunttiva clear, no obvious abnormalities on inspection of external nose.   NECK: normal movements of the head and neck  LUNGS: on inspection no signs of respiratory distress, breathing rate appears normal, no obvious gross SOB, gasping or wheezing  CV: no obvious cyanosis  PSYCH/NEURO: pleasant and cooperative, no obvious depression or anxiety, speech and thought processing grossly intact  ASSESSMENT AND PLAN:  Discussed the following assessment and plan:  Upper respiratory tract infection, unspecified type     I discussed the assessment and treatment plan with the patient. The patient was provided an opportunity to ask questions and all were answered. The patient agreed with the plan and demonstrated an understanding of the instructions.   The patient was advised to call back or seek an in-person evaluation if the symptoms worsen or if the condition fails to improve as anticipated.  I provided 20 minutes of non-face-to-face time during this encounter.   Dale Bolton, MD

## 2018-08-26 NOTE — Assessment & Plan Note (Signed)
Recent allergies.  Now with progression of symptoms.  Now with increased congestion, cough - productive of colored mucus.  Concern now with overlying bacterial infection.  Treat with omnicef as directed.  Take probiotic.  Continue flonase.  Robitussin DM as directed.  Update me regarding symptoms.  Remain out of work.

## 2018-09-13 MED FILL — FERREX 150 CAPSULE: 150 | 30 days supply | Qty: 30 | Fill #1

## 2018-09-13 MED FILL — EMGALITY 120 MG/ML SOSY: 120 | 28 days supply | Qty: 1 | Fill #1

## 2018-09-16 DIAGNOSIS — F332 Major depressive disorder, recurrent severe without psychotic features: Secondary | ICD-10-CM | POA: Diagnosis not present

## 2018-10-05 DIAGNOSIS — E041 Nontoxic single thyroid nodule: Secondary | ICD-10-CM | POA: Diagnosis not present

## 2018-10-12 DIAGNOSIS — E041 Nontoxic single thyroid nodule: Secondary | ICD-10-CM | POA: Diagnosis not present

## 2018-10-13 MED FILL — FERREX 150 CAPSULE: 150 | 30 days supply | Qty: 30 | Fill #2

## 2018-11-17 MED FILL — EMGALITY 120 MG/ML SOAJ: 120 | 28 days supply | Qty: 1 | Fill #0

## 2019-01-03 IMAGING — CT CT HEAD W/O CM
4 series · 17 of 47 positions shown, 19 images · non-contrast
Comparison: None.

CLINICAL DATA: Ct head wo, nausea dizziness headaches today

EXAM:
CT HEAD WITHOUT CONTRAST
TECHNIQUE: Contiguous axial images were obtained from the base of the skull
through the vertex without intravenous contrast.

[Series 2: head without · axial · non-contrast · 0.43mm/px · z∈[-86,+34]mm · 7 of 33 slices shown, 9 images]
[im 5/33  brain]
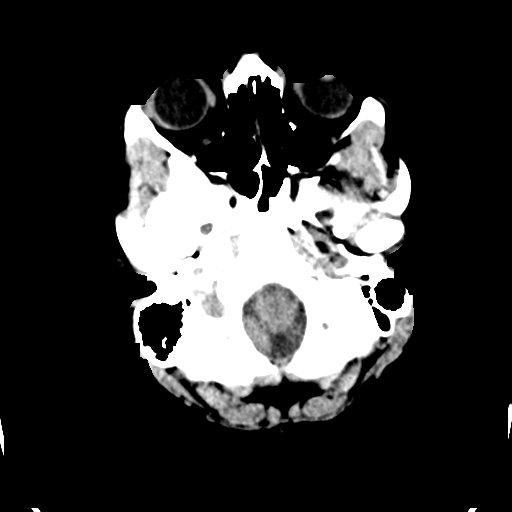
[im 5/33  bone]
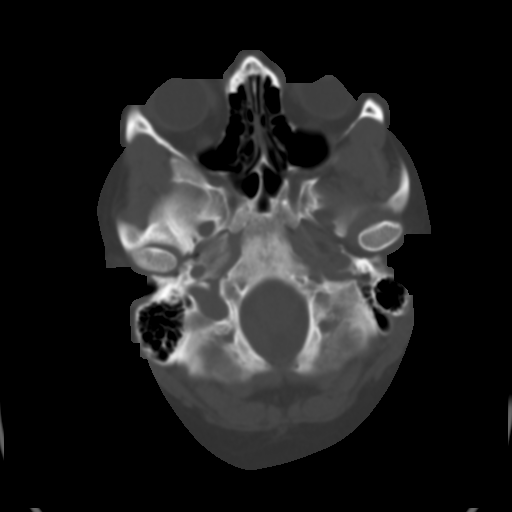
[im 9/33  brain]
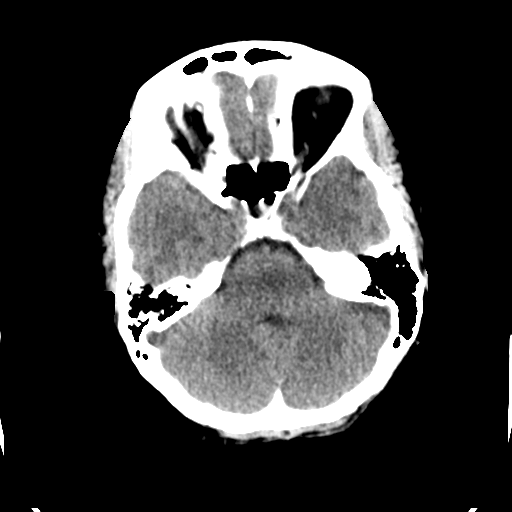
[im 13/33  brain]
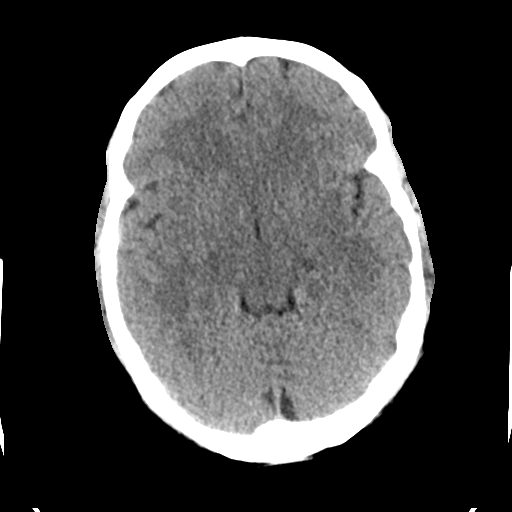
[im 17/33  brain]
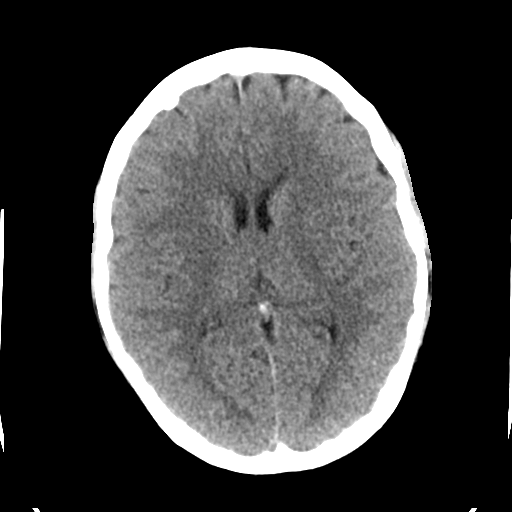
[im 21/33  brain]
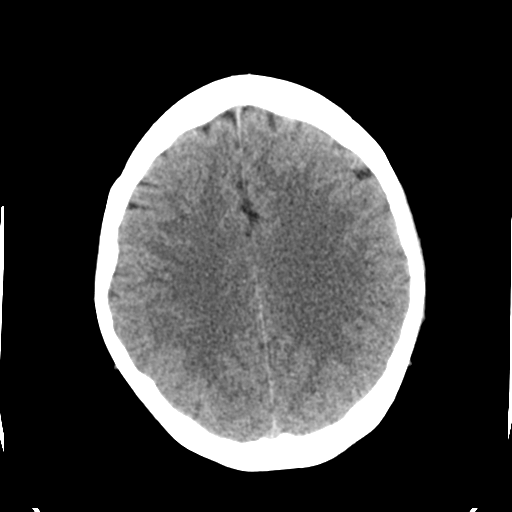
[im 21/33  bone]
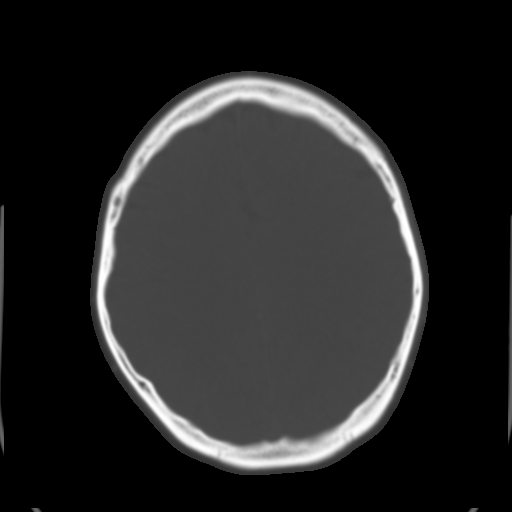
[im 25/33  brain]
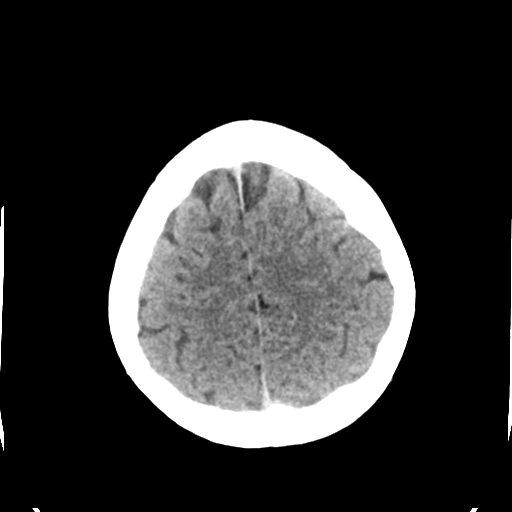
[im 29/33  brain]
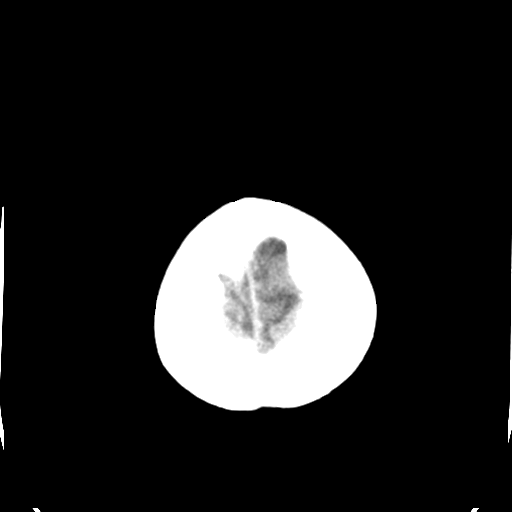

[Series 3: head bone · axial · 0.43mm/px · z∈[-90,-34]mm · 4 of 83 slices shown]
[im 9/83  bone]
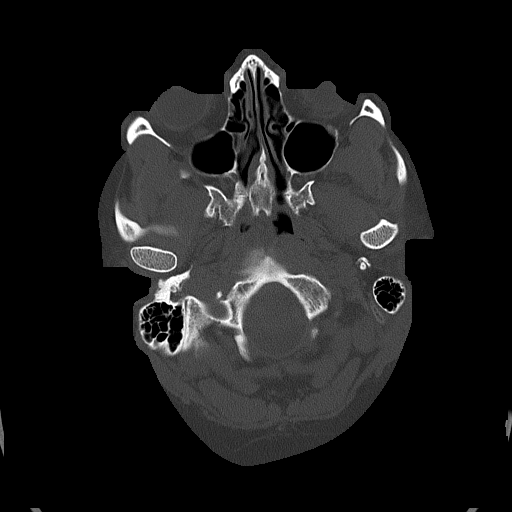
[im 17/83  bone]
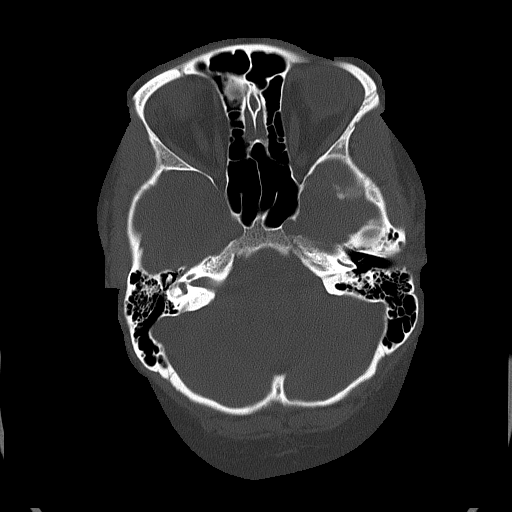
[im 25/83  bone]
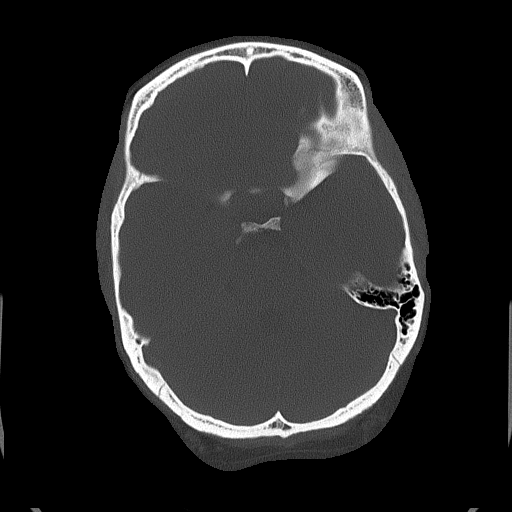
[im 37/83  bone]
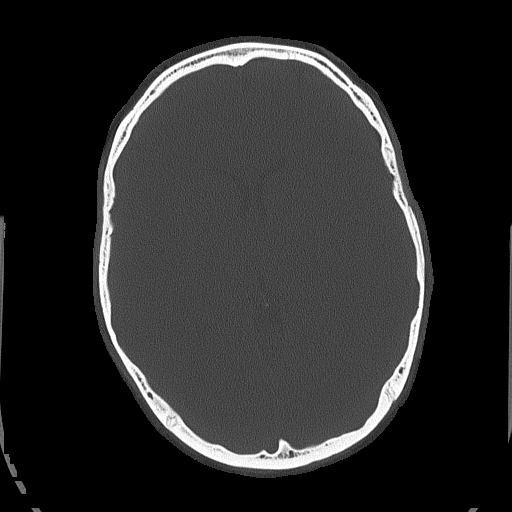

[Series 4: head without cor · coronal · non-contrast · 0.33mm/px · 3 of 70 slices shown]
[im 24/70  brain]
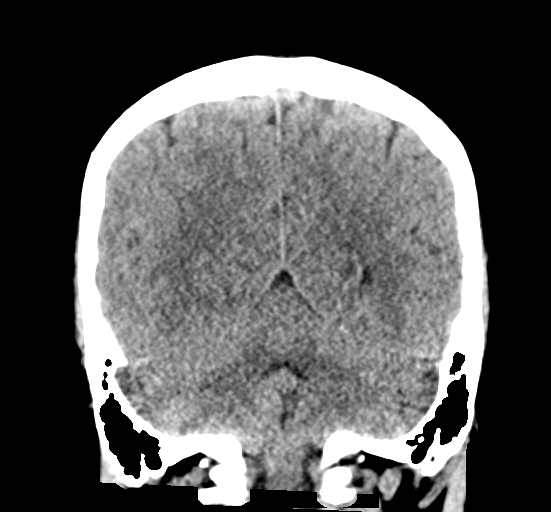
[im 31/70  brain]
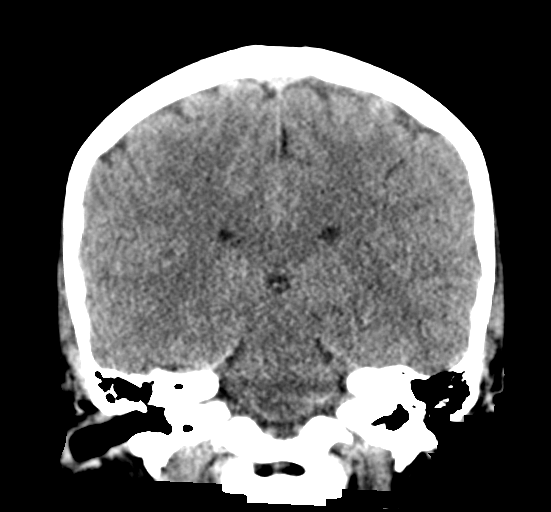
[im 39/70  brain]
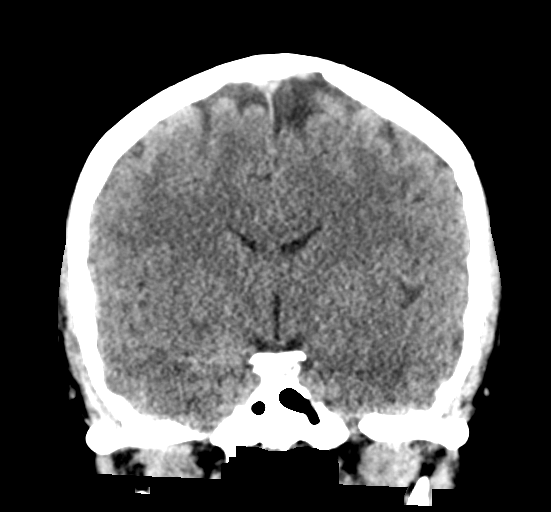

[Series 5: head without sag · sagittal · non-contrast · 0.35mm/px · 3 of 59 slices shown]
[im 20/59  brain]
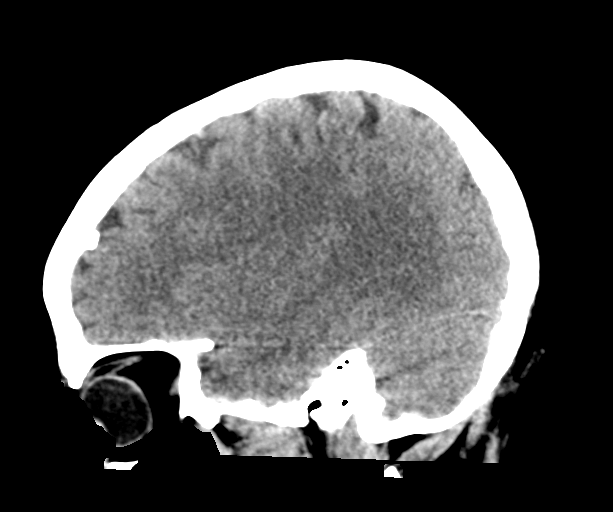
[im 30/59  brain]
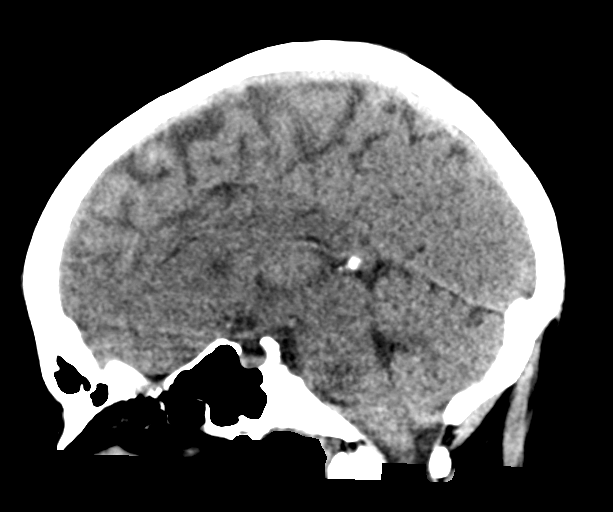
[im 39/59  brain]
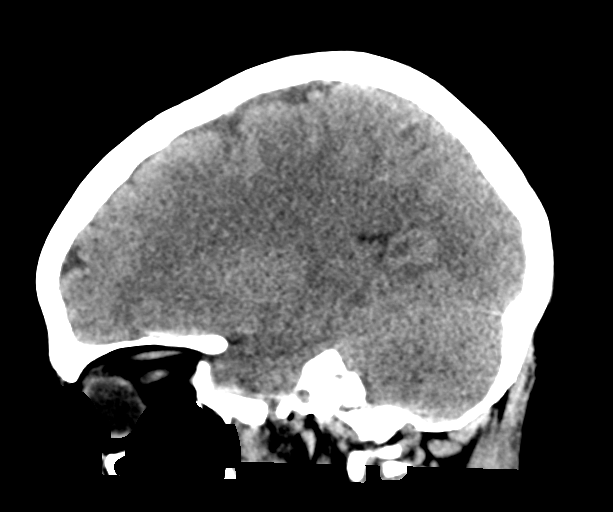

[17 of 47 positions shown; findings below may reference images not displayed]

FINDINGS: Brain: No evidence of acute infarction, hemorrhage, hydrocephalus,
extra-axial collection or mass lesion/mass effect.

Vascular: No hyperdense vessel or unexpected calcification.

Skull: Normal. Negative for fracture or focal lesion.

Sinuses/Orbits: Globes and orbits are unremarkable. Visualized
sinuses and mastoid air cells are clear.

Other: None.
IMPRESSION: Normal unenhanced CT scan of the brain.

## 2019-01-09 ENCOUNTER — Other Ambulatory Visit: Payer: Self-pay | Admitting: Internal Medicine

## 2019-01-09 MED FILL — EMGALITY 120 MG/ML SOAJ: 120 | 28 days supply | Qty: 1 | Fill #1

## 2019-01-10 ENCOUNTER — Ambulatory Visit (INDEPENDENT_AMBULATORY_CARE_PROVIDER_SITE_OTHER): Payer: 59 | Admitting: Internal Medicine

## 2019-01-10 ENCOUNTER — Other Ambulatory Visit: Payer: Self-pay

## 2019-01-10 ENCOUNTER — Encounter: Payer: Self-pay | Admitting: Internal Medicine

## 2019-01-10 DIAGNOSIS — G43109 Migraine with aura, not intractable, without status migrainosus: Secondary | ICD-10-CM

## 2019-01-10 DIAGNOSIS — E559 Vitamin D deficiency, unspecified: Secondary | ICD-10-CM

## 2019-01-10 DIAGNOSIS — E041 Nontoxic single thyroid nodule: Secondary | ICD-10-CM | POA: Diagnosis not present

## 2019-01-10 DIAGNOSIS — D509 Iron deficiency anemia, unspecified: Secondary | ICD-10-CM | POA: Diagnosis not present

## 2019-01-10 DIAGNOSIS — F439 Reaction to severe stress, unspecified: Secondary | ICD-10-CM | POA: Diagnosis not present

## 2019-01-10 MED ORDER — POLYSACCHARIDE IRON COMPLEX 150 MG PO CAPS: 150.0000 mg | ORAL_CAPSULE | Freq: Every day | ORAL | 2 refills | Status: DC

## 2019-01-10 MED FILL — FERREX 150 CAPSULE: 150 | 30 days supply | Qty: 30 | Fill #0

## 2019-01-10 NOTE — Progress Notes (Signed)
Patient ID: Tina Fleming, female   DOB: 12/18/1980, 38 y.o.   MRN: [id number]   Virtual Visit via virtual Note  This visit type was conducted due to national recommendations for restrictions regarding the COVID-19 pandemic (e.g. social distancing).  This format is felt to be most appropriate for this patient at this time.  All issues noted in this document were discussed and addressed.  No physical exam was performed (except for noted visual exam findings with Video Visits).   I connected with Tina Fleming by a video enabled telemedicine application or telephone and verified that I am speaking with the correct person using two identifiers. Location patient: home Location provider: work Persons participating in the virtual visit: patient, provider  I discussed the limitations, risks, security and privacy concerns of performing an evaluation and management service by telephone and the availability of in person appointments. The patient expressed understanding and agreed to proceed.  Interactive audio and video telecommunications were attempted between this provider and patient, however failed, due to technical difficulties.  We continued and completed visit with audio only.   Reason for visit: scheduled follow up.   HPI: She reports she is doing ok overall.  States headaches are better.  On emgality.  Watching her neck posture.  Sleeping.  Handling stress.  Doing weight watchers.  Has lost 25 pounds.  Trying to stay active.  Is walking.  Drinking more water.  No chest pain.  No sob.  No acid reflux.  No abdominal pain.  Bowels moving.  Saw Dr Tedd SiasSolum.  No change - thyroid nodule.  States no further w/up warranted.  No fever.  No cough or congestion.     ROS: See pertinent positives and negatives per HPI.  Past Medical History:  Diagnosis Date  . Abnormal EKG    no cardiology follow  up was needed  . Anemia    iron suppliments in past-not currently  . Anemia   . Heart murmur    asymtomatic  . History of kidney stones   . IBS (irritable bowel syndrome)   . Migraine   . Nephrolithiasis   . Obesity   . PONV (postoperative nausea and vomiting)    prolonged sedation  . Vitamin D deficiency     Past Surgical History:  Procedure Laterality Date  . APPENDECTOMY  03/21/12   laproscopic appy  . DILATATION & CURETTAGE/HYSTEROSCOPY WITH MYOSURE N/A 03/20/2016   Procedure: DILATATION & CURETTAGE/HYSTEROSCOPY WITH MYOSURE;  Surgeon: Maxie BetterSheronette Cousins, MD;  Location: [facility];  Service: Gynecology;  Laterality: N/A;  45 min. requested  . KNEE ARTHROSCOPY  04/24/2011   Procedure: ARTHROSCOPY KNEE;  Surgeon: Javier DockerJeffrey C Beane;  Location: [facility];  Service: Orthopedics;  Laterality: Right;  /Arthroscopy with debridement, right knee  . LAPAROSCOPIC APPENDECTOMY  03/21/2012   Procedure: APPENDECTOMY LAPAROSCOPIC;  Surgeon: Clovis Puhomas A. Cornett, MD;  Location: [facility];  Service: General;  Laterality: N/A;  . LITHOTRIPSY    . URETHRAL STRICTURE DILATATION     as a child    Family History  Problem Relation Age of Onset  . Cancer Mother        pancreatic  . Arthritis Mother   . Hyperlipidemia Mother   . Hypertension Mother   . Cancer Father        skin  . Arthritis Father   . Hyperlipidemia Father   . Hypertension Father   . Diabetes Father   . Ankylosing spondylitis Brother   . Spondylitis Brother   .  Breast cancer Paternal Aunt        83-60  . Breast cancer Paternal Aunt        50-60's    SOCIAL HX: reviewed.    Current Outpatient Medications:  .  ALPRAZolam (XANAX) 0.25 MG tablet, Take 1 tablet (0.25 mg total) by mouth 2 (two) times daily as needed for anxiety., Disp: 20 tablet, Rfl: 0 .  aspirin-acetaminophen-caffeine (EXCEDRIN MIGRAINE) 250-250-65 MG tablet, Take 1 tablet by mouth every 6 (six) hours as needed for headache or migraine., Disp: , Rfl:  .  cefdinir (OMNICEF) 300 MG capsule, Take 1 capsule (300 mg total) by mouth 2 (two) times  daily., Disp: 20 capsule, Rfl: 0 .  Cholecalciferol (VITAMIN D PO), Take 1 capsule by mouth every morning., Disp: , Rfl:  .  cyclobenzaprine (FLEXERIL) 5 MG tablet, Take 1 tablet by mouth 2 (two) times daily as needed., Disp: , Rfl:  .  iron polysaccharides (FERREX 150) 150 MG capsule, Take 1 capsule (150 mg total) by mouth daily., Disp: 30 capsule, Rfl: 2 .  ondansetron (ZOFRAN ODT) 4 MG disintegrating tablet, Take 1 tablet (4 mg total) by mouth every 12 (twelve) hours as needed for nausea or vomiting., Disp: 20 tablet, Rfl: 0 .  rizatriptan (MAXALT-MLT) 10 MG disintegrating tablet, TAKE 1 TABLET BY MOUTH AS NEEDED FOR MIGRAINE. MAY REPEAT IN 2 HOURS IF NEEDED, Disp: 10 tablet, Rfl: 0 .  Vitamin D, Ergocalciferol, (DRISDOL) 50000 units CAPS capsule, Take 1 capsule (50,000 Units total) by mouth every 7 (seven) days., Disp: 8 capsule, Rfl: 0  EXAM:  GENERAL: alert.  Sounds to be in no acute distress.  Answering questions appropriately.    PSYCH/NEURO: pleasant and cooperative, no obvious depression or anxiety, speech and thought processing grossly intact  ASSESSMENT AND PLAN:  Discussed the following assessment and plan:  Anemia On iron.  Follow cbc and ferritin.   Migraine Seeing neurology.  On emgality.  Doing better.  Watching neck posture.    Stress Overall handling things relatively well.  Discussed with her today.  Follow.    Thyroid nodule Saw Dr Gabriel Carina.  S/p biopsy.  Pt reports no change and that no further w/up warranted.  Last evaluated 10/12/18.    Vitamin D deficiency Recheck vitamin D level.      I discussed the assessment and treatment plan with the patient. The patient was provided an opportunity to ask questions and all were answered. The patient agreed with the plan and demonstrated an understanding of the instructions.   The patient was advised to call back or seek an in-person evaluation if the symptoms worsen or if the condition fails to improve as anticipated.   I provided 18 minutes of non-face-to-face time during this encounter.   Einar Pheasant, MD

## 2019-01-15 NOTE — Assessment & Plan Note (Signed)
Overall handling things relatively well.  Discussed with her today.  Follow.   

## 2019-01-15 NOTE — Assessment & Plan Note (Signed)
Recheck vitamin D level 

## 2019-01-15 NOTE — Assessment & Plan Note (Signed)
On iron.  Follow cbc and ferritin.  

## 2019-01-15 NOTE — Assessment & Plan Note (Addendum)
Saw Dr Gabriel Carina.  S/p biopsy.  Pt reports no change and that no further w/up warranted.  Last evaluated 10/12/18.

## 2019-01-15 NOTE — Assessment & Plan Note (Signed)
Seeing neurology.  On emgality.  Doing better.  Watching neck posture.

## 2019-01-16 NOTE — Addendum Note (Signed)
Addended by: Alisa Graff on: 01/16/2019 08:25 AM   Modules accepted: Level of Service

## 2019-01-24 ENCOUNTER — Telehealth: Payer: Self-pay

## 2019-01-24 NOTE — Telephone Encounter (Signed)
Copied from [place] [id number]. Topic: General - Inquiry >> Jan 24, 2019  1:22 PM Mathis Bud wrote: Reason for CRM: Patient called to schedule her lab, patient would like to do first thing in the morning next Thursday 9/10 due to her fasting and work purposes. Patient gets her work schedule a week in advance so it is hard to schedule anything out other than a week.  Patient stated her labs expire in 3 weeks. Patient call back [phone]

## 2019-02-17 ENCOUNTER — Other Ambulatory Visit (INDEPENDENT_AMBULATORY_CARE_PROVIDER_SITE_OTHER): Payer: 59

## 2019-02-17 ENCOUNTER — Other Ambulatory Visit: Payer: Self-pay

## 2019-02-17 DIAGNOSIS — D509 Iron deficiency anemia, unspecified: Secondary | ICD-10-CM | POA: Diagnosis not present

## 2019-02-17 DIAGNOSIS — E559 Vitamin D deficiency, unspecified: Secondary | ICD-10-CM | POA: Diagnosis not present

## 2019-02-17 LAB — CBC WITH DIFFERENTIAL/PLATELET
Basophils Absolute: 0.1 10*3/uL (ref 0.0–0.1)
Basophils Relative: 0.7 % (ref 0.0–3.0)
Eosinophils Absolute: 0.1 10*3/uL (ref 0.0–0.7)
Eosinophils Relative: 1.3 % (ref 0.0–5.0)
HCT: 36.2 % (ref 36.0–46.0)
Hemoglobin: 11.4 g/dL — ABNORMAL LOW (ref 12.0–15.0)
Lymphocytes Relative: 25.6 % (ref 12.0–46.0)
Lymphs Abs: 2.1 10*3/uL (ref 0.7–4.0)
MCHC: 31.5 g/dL (ref 30.0–36.0)
MCV: 72.6 fl — ABNORMAL LOW (ref 78.0–100.0)
Monocytes Absolute: 0.5 10*3/uL (ref 0.1–1.0)
Monocytes Relative: 5.8 % (ref 3.0–12.0)
Neutro Abs: 5.4 10*3/uL (ref 1.4–7.7)
Neutrophils Relative %: 66.6 % (ref 43.0–77.0)
Platelets: 410 10*3/uL — ABNORMAL HIGH (ref 150.0–400.0)
RBC: 4.99 Mil/uL (ref 3.87–5.11)
RDW: 18.7 % — ABNORMAL HIGH (ref 11.5–15.5)
WBC: 8.1 10*3/uL (ref 4.0–10.5)

## 2019-02-17 LAB — COMPREHENSIVE METABOLIC PANEL
ALT: 13 U/L (ref 0–35)
AST: 15 U/L (ref 0–37)
Albumin: 4.3 g/dL (ref 3.5–5.2)
Alkaline Phosphatase: 86 U/L (ref 39–117)
BUN: 12 mg/dL (ref 6–23)
CO2: 27 mEq/L (ref 19–32)
Calcium: 9.8 mg/dL (ref 8.4–10.5)
Chloride: 104 mEq/L (ref 96–112)
Creatinine, Ser: 0.74 mg/dL (ref 0.40–1.20)
GFR: 88.2 mL/min (ref >60.00)
Glucose, Bld: 86 mg/dL (ref 70–99)
Potassium: 4.6 mEq/L (ref 3.5–5.1)
Sodium: 140 mEq/L (ref 135–145)
Total Bilirubin: 0.4 mg/dL (ref 0.2–1.2)
Total Protein: 7 g/dL (ref 6.0–8.3)

## 2019-02-17 LAB — LIPID PANEL
Cholesterol: 189 mg/dL (ref 0–200)
HDL: 50 mg/dL (ref >39.00)
LDL Cholesterol: 121 mg/dL — ABNORMAL HIGH (ref 0–99)
NonHDL: 139.31
Total CHOL/HDL Ratio: 4
Triglycerides: 93 mg/dL (ref 0.0–149.0)
VLDL: 18.6 mg/dL (ref 0.0–40.0)

## 2019-02-17 LAB — VITAMIN D 25 HYDROXY (VIT D DEFICIENCY, FRACTURES): VITD: 21.26 ng/mL — ABNORMAL LOW (ref 30.00–100.00)

## 2019-02-17 LAB — FERRITIN: Ferritin: 3.7 ng/mL — ABNORMAL LOW (ref 10.0–291.0)

## 2019-02-21 ENCOUNTER — Encounter: Payer: Self-pay | Admitting: Internal Medicine

## 2019-02-22 ENCOUNTER — Other Ambulatory Visit: Payer: Self-pay | Admitting: Internal Medicine

## 2019-02-22 DIAGNOSIS — D649 Anemia, unspecified: Secondary | ICD-10-CM

## 2019-02-22 NOTE — Progress Notes (Signed)
Order placed for f/u labs.  

## 2019-02-23 ENCOUNTER — Ambulatory Visit: Payer: Self-pay

## 2019-02-23 NOTE — Telephone Encounter (Signed)
Pt. Is at work and reports she has had 2 episodes of dizziness while sitting down. BP 145/96 and Pulse 64. Reports she has had vertigo in the past, "but this is not that." Request a visit for tomorrow if possible.  Answer Assessment - Initial Assessment Questions 1. DESCRIPTION: "Describe your dizziness."     Dizzy 2. LIGHTHEADED: "Do you feel lightheaded?" (e.g., somewhat faint, woozy, weak upon standing)     Lightheaded 3. VERTIGO: "Do you feel like either you or the room is spinning or tilting?" (i.e. vertigo)     No 4. SEVERITY: "How bad is it?"  "Do you feel like you are going to faint?" "Can you stand and walk?"   - MILD - walking normally   - MODERATE - interferes with normal activities (e.g., work, school)    - SEVERE - unable to stand, requires support to walk, feels like passing out now.      Mild 5. ONSET:  "When did the dizziness begin?"     This morning 6. AGGRAVATING FACTORS: "Does anything make it worse?" (e.g., standing, change in head position)     No 7. HEART RATE: "Can you tell me your heart rate?" "How many beats in 15 seconds?"  (Note: not all patients can do this)       Pulse 64 8. CAUSE: "What do you think is causing the dizziness?"     Unsure 9. RECURRENT SYMPTOM: "Have you had dizziness before?" If so, ask: "When was the last time?" "What happened that time?"     Vertigo 10. OTHER SYMPTOMS: "Do you have any other symptoms?" (e.g., fever, chest pain, vomiting, diarrhea, bleeding)       Increased BP 11. PREGNANCY: "Is there any chance you are pregnant?" "When was your last menstrual period?"       No  Protocols used: DIZZINESS [facility]

## 2019-02-23 NOTE — Telephone Encounter (Signed)
With blood pressure elevated and dizziness, I would prefer her be evaluated today and we can f/u after.

## 2019-02-23 NOTE — Telephone Encounter (Signed)
The [facility] Triage Nurse routed this call to be scheduled for an OV tomorrow.  Do you want this patient to wait to see a provider tomorrow or go to Urgent Care?

## 2019-02-23 NOTE — Telephone Encounter (Signed)
Patient aware and is going to UC 

## 2019-03-01 MED ORDER — POLYSACCHARIDE IRON COMPLEX 150 MG PO CAPS: 150.0000 mg | ORAL_CAPSULE | Freq: Two times a day (BID) | ORAL | 2 refills | Status: DC

## 2019-03-01 MED FILL — FERREX 150 CAPSULE: 150 | 30 days supply | Qty: 60 | Fill #0

## 2019-03-01 NOTE — Telephone Encounter (Signed)
rx sent in for bid iron

## 2019-03-03 MED FILL — EMGALITY 120 MG/ML SOAJ: 120 | 28 days supply | Qty: 1 | Fill #2

## 2019-03-10 DIAGNOSIS — R42 Dizziness and giddiness: Secondary | ICD-10-CM | POA: Diagnosis not present

## 2019-03-15 ENCOUNTER — Other Ambulatory Visit: Payer: Self-pay | Admitting: Internal Medicine

## 2019-03-15 ENCOUNTER — Encounter: Payer: Self-pay | Admitting: Internal Medicine

## 2019-03-15 DIAGNOSIS — D509 Iron deficiency anemia, unspecified: Secondary | ICD-10-CM

## 2019-03-15 NOTE — Progress Notes (Signed)
Order placed for B12 lab.   

## 2019-03-17 DIAGNOSIS — R42 Dizziness and giddiness: Secondary | ICD-10-CM | POA: Diagnosis not present

## 2019-03-24 ENCOUNTER — Other Ambulatory Visit: Payer: Self-pay | Admitting: Family Medicine

## 2019-03-24 ENCOUNTER — Other Ambulatory Visit: Payer: Self-pay | Admitting: Otolaryngology

## 2019-03-24 DIAGNOSIS — R42 Dizziness and giddiness: Secondary | ICD-10-CM

## 2019-03-24 DIAGNOSIS — E041 Nontoxic single thyroid nodule: Secondary | ICD-10-CM | POA: Diagnosis not present

## 2019-03-24 MED FILL — SCOPOLAMINE 1 MG/3DAYS PT72: 1 | 18 days supply | Qty: 6 | Fill #0

## 2019-03-24 MED FILL — FERREX 150 CAPSULE: 150 | 30 days supply | Qty: 60 | Fill #1

## 2019-03-27 DIAGNOSIS — G43119 Migraine with aura, intractable, without status migrainosus: Secondary | ICD-10-CM | POA: Diagnosis not present

## 2019-04-04 ENCOUNTER — Ambulatory Visit
Admission: RE | Admit: 2019-04-04 | Discharge: 2019-04-04 | Disposition: A | Discharge status: Home / Self Care | Payer: 59 | Source: Ambulatory Visit | Attending: Otolaryngology | Admitting: Otolaryngology

## 2019-04-04 ENCOUNTER — Other Ambulatory Visit: Payer: Self-pay

## 2019-04-04 DIAGNOSIS — R42 Dizziness and giddiness: Secondary | ICD-10-CM | POA: Diagnosis not present

## 2019-04-04 MED ORDER — GADOBUTROL 1 MMOL/ML IV SOLN
10.0000 mL | Freq: Once | INTRAVENOUS | Status: AC | PRN
  Administered 2019-04-04: 10 mL via INTRAVENOUS

## 2019-04-07 ENCOUNTER — Other Ambulatory Visit: Payer: Self-pay

## 2019-04-07 ENCOUNTER — Ambulatory Visit (INDEPENDENT_AMBULATORY_CARE_PROVIDER_SITE_OTHER): Payer: 59 | Admitting: Internal Medicine

## 2019-04-07 DIAGNOSIS — D649 Anemia, unspecified: Secondary | ICD-10-CM

## 2019-04-07 DIAGNOSIS — M5481 Occipital neuralgia: Secondary | ICD-10-CM | POA: Diagnosis not present

## 2019-04-07 DIAGNOSIS — G43119 Migraine with aura, intractable, without status migrainosus: Secondary | ICD-10-CM | POA: Diagnosis not present

## 2019-04-07 DIAGNOSIS — D509 Iron deficiency anemia, unspecified: Secondary | ICD-10-CM | POA: Diagnosis not present

## 2019-04-07 DIAGNOSIS — R42 Dizziness and giddiness: Secondary | ICD-10-CM | POA: Diagnosis not present

## 2019-04-07 DIAGNOSIS — M542 Cervicalgia: Secondary | ICD-10-CM | POA: Diagnosis not present

## 2019-04-07 LAB — VITAMIN B12: Vitamin B-12: 972 pg/mL — ABNORMAL HIGH (ref 211–911)

## 2019-04-07 LAB — CBC WITH DIFFERENTIAL/PLATELET
Basophils Absolute: 0.1 10*3/uL (ref 0.0–0.1)
Basophils Relative: 0.6 % (ref 0.0–3.0)
Eosinophils Absolute: 0.1 10*3/uL (ref 0.0–0.7)
Eosinophils Relative: 1 % (ref 0.0–5.0)
HCT: 36.9 % (ref 36.0–46.0)
Hemoglobin: 11.7 g/dL — ABNORMAL LOW (ref 12.0–15.0)
Lymphocytes Relative: 22.8 % (ref 12.0–46.0)
Lymphs Abs: 2.1 10*3/uL (ref 0.7–4.0)
MCHC: 31.8 g/dL (ref 30.0–36.0)
MCV: 73.5 fl — ABNORMAL LOW (ref 78.0–100.0)
Monocytes Absolute: 0.7 10*3/uL (ref 0.1–1.0)
Monocytes Relative: 7.2 % (ref 3.0–12.0)
Neutro Abs: 6.4 10*3/uL (ref 1.4–7.7)
Neutrophils Relative %: 68.4 % (ref 43.0–77.0)
Platelets: 413 10*3/uL — ABNORMAL HIGH (ref 150.0–400.0)
RBC: 5.02 Mil/uL (ref 3.87–5.11)
RDW: 20.1 % — ABNORMAL HIGH (ref 11.5–15.5)
WBC: 9.4 10*3/uL (ref 4.0–10.5)

## 2019-04-07 LAB — FERRITIN: Ferritin: 7.1 ng/mL — ABNORMAL LOW (ref 10.0–291.0)

## 2019-04-07 MED FILL — BACLOFEN 10 MG TABS: 10 | 30 days supply | Qty: 45 | Fill #0

## 2019-04-09 ENCOUNTER — Encounter: Payer: Self-pay | Admitting: Internal Medicine

## 2019-04-09 NOTE — Progress Notes (Signed)
Pt came in for lab only visit.

## 2019-04-12 ENCOUNTER — Other Ambulatory Visit: Payer: Self-pay | Admitting: Internal Medicine

## 2019-04-12 ENCOUNTER — Encounter: Payer: Self-pay | Admitting: Internal Medicine

## 2019-04-12 DIAGNOSIS — D509 Iron deficiency anemia, unspecified: Secondary | ICD-10-CM

## 2019-04-12 NOTE — Progress Notes (Signed)
Orders placed for f/u labs.  

## 2019-05-23 MED FILL — FERREX 150 CAPSULE: 150 | 30 days supply | Qty: 60 | Fill #2

## 2019-05-23 MED FILL — BACLOFEN 10 MG TABS: 10 | 30 days supply | Qty: 45 | Fill #1

## 2019-05-23 MED FILL — EMGALITY 120 MG/ML SOAJ: 120 | 28 days supply | Qty: 1 | Fill #4

## 2019-06-16 ENCOUNTER — Other Ambulatory Visit: Payer: 59

## 2019-06-19 ENCOUNTER — Other Ambulatory Visit: Payer: Self-pay

## 2019-06-19 ENCOUNTER — Telehealth: Payer: Self-pay | Admitting: Internal Medicine

## 2019-06-19 ENCOUNTER — Ambulatory Visit (INDEPENDENT_AMBULATORY_CARE_PROVIDER_SITE_OTHER): Payer: 59 | Admitting: Internal Medicine

## 2019-06-19 DIAGNOSIS — U071 COVID-19: Secondary | ICD-10-CM

## 2019-06-19 MED ORDER — BENZONATATE 100 MG PO CAPS: 100.0000 mg | ORAL_CAPSULE | Freq: Three times a day (TID) | ORAL | 0 refills | Status: DC | PRN

## 2019-06-19 MED ORDER — NYSTATIN 100000 UNIT/ML MT SUSP: OROMUCOSAL | 0 refills | Status: DC

## 2019-06-19 NOTE — Telephone Encounter (Signed)
See if she can do a doxy at 2:30 today.  Thanks

## 2019-06-19 NOTE — Telephone Encounter (Signed)
Patient tested positive for COVID. She was wondering if Dr. Lorin Picket can prescribe, a mouth wash for the sores in her mouth and anything for congestion, over the counter is not doing anything.

## 2019-06-19 NOTE — Telephone Encounter (Signed)
Pt has been scheduled for 2:30pm today per Dr. Lorin Picket.

## 2019-06-19 NOTE — Progress Notes (Signed)
Patient ID: Tina Fleming, female   DOB: May 01, 1981, 39 y.o.   MRN: [id number]   Virtual Visit via video Note  This visit type was conducted due to national recommendations for restrictions regarding the COVID-19 pandemic (e.g. social distancing).  This format is felt to be most appropriate for this patient at this time.  All issues noted in this document were discussed and addressed.  No physical exam was performed (except for noted visual exam findings with Video Visits).   I connected with Victorino Dike Claycomb by a video enabled telemedicine application and verified that I am speaking with the correct person using two identifiers. Location patient: home Location provider: work  Persons participating in the virtual visit: patient, provider  The limitations, risks, security and privacy concerns of performing an evaluation and management service by video and the availability of in person appointments have been discussed.  The patient expressed understanding and agreed to proceed.   Reason for visit: work in appt  HPI: Reports tested positive for covid Saturday 06/17/19. States started having some symptoms Wednesday 06/07/19.  Initial rapid test negative.  States had some diarrhea.  Initially thought was something she ate.  Secretary at work - covid positive.  Subsequently developed low grade fever with Tmax 100.5.  No fever now.  Some headache/congestion.  Decreased taste. No loss of smell.  Some cough.  No chest congestion, chest pain or sob.  Does report sores in her mouth. No vomiting or nausea.     ROS: See pertinent positives and negatives per HPI.  Past Medical History:  Diagnosis Date  . Abnormal EKG    no cardiology follow  up was needed  . Anemia    iron suppliments in past-not currently  . Anemia   . Heart murmur    asymtomatic  . History of kidney stones   . IBS (irritable bowel syndrome)   . Migraine   . Nephrolithiasis   . Obesity   . PONV (postoperative nausea and  vomiting)    prolonged sedation  . Vitamin D deficiency     Past Surgical History:  Procedure Laterality Date  . APPENDECTOMY  03/21/12   laproscopic appy  . DILATATION & CURETTAGE/HYSTEROSCOPY WITH MYOSURE N/A 03/20/2016   Procedure: DILATATION & CURETTAGE/HYSTEROSCOPY WITH MYOSURE;  Surgeon: Maxie Better, MD;  Location: [facility];  Service: Gynecology;  Laterality: N/A;  45 min. requested  . KNEE ARTHROSCOPY  04/24/2011   Procedure: ARTHROSCOPY KNEE;  Surgeon: Javier Docker;  Location: [facility];  Service: Orthopedics;  Laterality: Right;  /Arthroscopy with debridement, right knee  . LAPAROSCOPIC APPENDECTOMY  03/21/2012   Procedure: APPENDECTOMY LAPAROSCOPIC;  Surgeon: Clovis Pu. Cornett, MD;  Location: [facility];  Service: General;  Laterality: N/A;  . LITHOTRIPSY    . URETHRAL STRICTURE DILATATION     as a child    Family History  Problem Relation Age of Onset  . Cancer Mother        pancreatic  . Arthritis Mother   . Hyperlipidemia Mother   . Hypertension Mother   . Cancer Father        skin  . Arthritis Father   . Hyperlipidemia Father   . Hypertension Father   . Diabetes Father   . Ankylosing spondylitis Brother   . Spondylitis Brother   . Breast cancer Paternal Aunt        50-60  . Breast cancer Paternal Aunt        50-60's    SOCIAL  HX: reviewed.    Current Outpatient Medications:  .  ALPRAZolam (XANAX) 0.25 MG tablet, Take 1 tablet (0.25 mg total) by mouth 2 (two) times daily as needed for anxiety., Disp: 20 tablet, Rfl: 0 .  aspirin-acetaminophen-caffeine (EXCEDRIN MIGRAINE) 250-250-65 MG tablet, Take 1 tablet by mouth every 6 (six) hours as needed for headache or migraine., Disp: , Rfl:  .  cefdinir (OMNICEF) 300 MG capsule, Take 1 capsule (300 mg total) by mouth 2 (two) times daily., Disp: 20 capsule, Rfl: 0 .  Cholecalciferol (VITAMIN D PO), Take 1 capsule by mouth every morning., Disp: , Rfl:  .  cyclobenzaprine (FLEXERIL) 5 MG tablet,  Take 1 tablet by mouth 2 (two) times daily as needed., Disp: , Rfl:  .  iron polysaccharides (FERREX 150) 150 MG capsule, Take 1 capsule (150 mg total) by mouth 2 (two) times daily., Disp: 60 capsule, Rfl: 2 .  ondansetron (ZOFRAN ODT) 4 MG disintegrating tablet, Take 1 tablet (4 mg total) by mouth every 12 (twelve) hours as needed for nausea or vomiting., Disp: 20 tablet, Rfl: 0 .  rizatriptan (MAXALT-MLT) 10 MG disintegrating tablet, TAKE 1 TABLET BY MOUTH AS NEEDED FOR MIGRAINE. MAY REPEAT IN 2 HOURS IF NEEDED, Disp: 10 tablet, Rfl: 0 .  Vitamin D, Ergocalciferol, (DRISDOL) 50000 units CAPS capsule, Take 1 capsule (50,000 Units total) by mouth every 7 (seven) days., Disp: 8 capsule, Rfl: 0 .  benzonatate (TESSALON PERLES) 100 MG capsule, Take 1 capsule (100 mg total) by mouth 3 (three) times daily as needed for cough., Disp: 21 capsule, Rfl: 0 .  nystatin (MYCOSTATIN) 100000 UNIT/ML suspension, 5cc's swish and spit tid, Disp: 60 mL, Rfl: 0  EXAM:  GENERAL: alert, oriented, appears well and in no acute distress  HEENT: atraumatic, conjunttiva clear, no obvious abnormalities on inspection of external nose and ears  NECK: normal movements of the head and neck  LUNGS: on inspection no signs of respiratory distress, breathing rate appears normal, no obvious gross SOB, gasping or wheezing  CV: no obvious cyanosis  PSYCH/NEURO: pleasant and cooperative, no obvious depression or anxiety, speech and thought processing grossly intact  ASSESSMENT AND PLAN:  Discussed the following assessment and plan:  COVID-19 virus infection Recently tested positive for covid.  No significant chest congestion, chest tightness or sob.  Some cough.  Has been taking tussin, dayquil, tylenol.  Using flonase.  With mouth sores. Decreased taste. 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  Nystatin to help with mouth sores.  Continue flonase. Tessalon perles to help with cough.  Rest.  Stay hydrated.  Follow.  Call  with update.      Meds ordered this encounter  Medications  . benzonatate (TESSALON PERLES) 100 MG capsule    Sig: Take 1 capsule (100 mg total) by mouth 3 (three) times daily as needed for cough.    Dispense:  21 capsule    Refill:  0  . nystatin (MYCOSTATIN) 100000 UNIT/ML suspension    Sig: 5cc's swish and spit tid    Dispense:  60 mL    Refill:  0     I discussed the assessment and treatment plan with the patient. The patient was provided an opportunity to ask questions and all were answered. The patient agreed with the plan and demonstrated an understanding of the instructions.   The patient was advised to call back or seek an in-person evaluation if the symptoms worsen or if the condition fails to improve as anticipated.   Emerald Shor  Nicki Reaper, MD

## 2019-06-20 ENCOUNTER — Encounter: Payer: Self-pay | Admitting: Internal Medicine

## 2019-06-23 ENCOUNTER — Other Ambulatory Visit: Payer: 59

## 2019-06-24 ENCOUNTER — Encounter: Payer: Self-pay | Admitting: Internal Medicine

## 2019-06-24 DIAGNOSIS — Z8616 Personal history of COVID-19: Secondary | ICD-10-CM | POA: Insufficient documentation

## 2019-06-24 NOTE — Assessment & Plan Note (Addendum)
Recently tested positive for covid.  No significant chest congestion, chest tightness or sob.  Some cough.  Has been taking tussin, dayquil, tylenol.  Using flonase.  With mouth sores. Decreased taste. 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  Nystatin to help with mouth sores.  Continue flonase. Tessalon perles to help with cough.  Rest.  Stay hydrated.  Follow.  Call with update.

## 2019-07-06 DIAGNOSIS — G43119 Migraine with aura, intractable, without status migrainosus: Secondary | ICD-10-CM | POA: Diagnosis not present

## 2019-07-20 ENCOUNTER — Other Ambulatory Visit: Payer: Self-pay | Admitting: Internal Medicine

## 2019-07-20 MED FILL — EMGALITY 120 MG/ML SOAJ: 120 | 28 days supply | Qty: 1 | Fill #5

## 2019-07-20 MED FILL — FERREX 150 CAPSULE: 150 | 30 days supply | Qty: 60 | Fill #0

## 2019-08-07 ENCOUNTER — Encounter: Payer: Self-pay | Admitting: Internal Medicine

## 2019-08-08 NOTE — Telephone Encounter (Signed)
Please confirm with pt no period/cramping or any symptoms now.  States she is sure not pregnant.  If no symptoms now, then would recommend keeping a menstrual diary.  Also, if any other symptoms and if no acute symptoms, we can discuss more at her upcoming appt.  Let me know if any problems or acute symptoms.

## 2019-08-21 ENCOUNTER — Ambulatory Visit (INDEPENDENT_AMBULATORY_CARE_PROVIDER_SITE_OTHER): Payer: 59 | Admitting: Internal Medicine

## 2019-08-21 ENCOUNTER — Other Ambulatory Visit: Payer: Self-pay

## 2019-08-21 VITALS — BP 122/70 | HR 68 | Temp 97.7°F | Resp 16 | Ht 65.0 in | Wt 238.0 lb

## 2019-08-21 DIAGNOSIS — N938 Other specified abnormal uterine and vaginal bleeding: Secondary | ICD-10-CM | POA: Diagnosis not present

## 2019-08-21 DIAGNOSIS — D509 Iron deficiency anemia, unspecified: Secondary | ICD-10-CM | POA: Diagnosis not present

## 2019-08-21 DIAGNOSIS — E559 Vitamin D deficiency, unspecified: Secondary | ICD-10-CM | POA: Diagnosis not present

## 2019-08-21 DIAGNOSIS — F439 Reaction to severe stress, unspecified: Secondary | ICD-10-CM

## 2019-08-21 DIAGNOSIS — R110 Nausea: Secondary | ICD-10-CM

## 2019-08-21 DIAGNOSIS — R140 Abdominal distension (gaseous): Secondary | ICD-10-CM | POA: Diagnosis not present

## 2019-08-21 DIAGNOSIS — Z0000 Encounter for general adult medical examination without abnormal findings: Secondary | ICD-10-CM

## 2019-08-21 DIAGNOSIS — N926 Irregular menstruation, unspecified: Secondary | ICD-10-CM

## 2019-08-21 DIAGNOSIS — G43109 Migraine with aura, not intractable, without status migrainosus: Secondary | ICD-10-CM

## 2019-08-21 DIAGNOSIS — Z8616 Personal history of COVID-19: Secondary | ICD-10-CM

## 2019-08-21 LAB — IBC + FERRITIN
Ferritin: 5.4 ng/mL — ABNORMAL LOW (ref 10.0–291.0)
Iron: 36 ug/dL — ABNORMAL LOW (ref 42–145)
Saturation Ratios: 7.7 % — ABNORMAL LOW (ref 20.0–50.0)
Transferrin: 334 mg/dL (ref 212.0–360.0)

## 2019-08-21 LAB — CBC WITH DIFFERENTIAL/PLATELET
Basophils Absolute: 0.1 10*3/uL (ref 0.0–0.1)
Basophils Relative: 1.4 % (ref 0.0–3.0)
Eosinophils Absolute: 0.1 10*3/uL (ref 0.0–0.7)
Eosinophils Relative: 1.3 % (ref 0.0–5.0)
HCT: 37.9 % (ref 36.0–46.0)
Hemoglobin: 12 g/dL (ref 12.0–15.0)
Lymphocytes Relative: 15.6 % (ref 12.0–46.0)
Lymphs Abs: 1.4 10*3/uL (ref 0.7–4.0)
MCHC: 31.7 g/dL (ref 30.0–36.0)
MCV: 74.5 fl — ABNORMAL LOW (ref 78.0–100.0)
Monocytes Absolute: 0.4 10*3/uL (ref 0.1–1.0)
Monocytes Relative: 4.5 % (ref 3.0–12.0)
Neutro Abs: 6.7 10*3/uL (ref 1.4–7.7)
Neutrophils Relative %: 77.2 % — ABNORMAL HIGH (ref 43.0–77.0)
Platelets: 352 10*3/uL (ref 150.0–400.0)
RBC: 5.08 Mil/uL (ref 3.87–5.11)
RDW: 20 % — ABNORMAL HIGH (ref 11.5–15.5)
WBC: 8.7 10*3/uL (ref 4.0–10.5)

## 2019-08-21 LAB — HEPATIC FUNCTION PANEL
ALT: 19 U/L (ref 0–35)
AST: 18 U/L (ref 0–37)
Albumin: 4.4 g/dL (ref 3.5–5.2)
Alkaline Phosphatase: 75 U/L (ref 39–117)
Bilirubin, Direct: 0.1 mg/dL (ref 0.0–0.3)
Total Bilirubin: 0.4 mg/dL (ref 0.2–1.2)
Total Protein: 7.3 g/dL (ref 6.0–8.3)

## 2019-08-21 LAB — BASIC METABOLIC PANEL
BUN: 13 mg/dL (ref 6–23)
CO2: 26 mEq/L (ref 19–32)
Calcium: 9.4 mg/dL (ref 8.4–10.5)
Chloride: 105 mEq/L (ref 96–112)
Creatinine, Ser: 0.82 mg/dL (ref 0.40–1.20)
GFR: 78.14 mL/min (ref >60.00)
Glucose, Bld: 97 mg/dL (ref 70–99)
Potassium: 3.9 mEq/L (ref 3.5–5.1)
Sodium: 137 mEq/L (ref 135–145)

## 2019-08-21 LAB — TSH: TSH: 1.54 u[IU]/mL (ref 0.35–4.50)

## 2019-08-21 LAB — POCT URINE PREGNANCY: Preg Test, Ur: NEGATIVE

## 2019-08-21 NOTE — Progress Notes (Signed)
Patient ID: Tina Fleming, female   DOB: [birth year], 39 y.o.   MRN: [id number]   Subjective:    Patient ID: Tina Fleming, female    DOB: [birth year], 39 y.o.   MRN: [id number]  HPI This visit occurred during the SARS-CoV-2 public health emergency.  Safety protocols were in place, including screening questions prior to the visit, additional usage of staff PPE, and extensive cleaning of exam room while observing appropriate contact time as indicated for disinfecting solutions.  Patient here for her physical exam.  She is also having problems with her stomach.  Had covid.   Had some GI issues then.  Reports persistent abdominal discomfort.  Bowels moving - 4-5x/day. No diarrhea.  Formed stool.  Increased bloating.  Eating, but does report nausea.  Some belching - am worse.  Also had normal period in 06/2019.  In March - had cramping Wed, Thurs and Friday - no period.  Sat - 24 hour period.  Since Saturday - spotting.  Denies possibility of being pregnant.  No vaginal problems.  Previous headache.  Steroid dosepack - did not help.  Massage - better.  Eating.  Handling stress.  Concerned regarding persistent symptoms.    Past Medical History:  Diagnosis Date  . Abnormal EKG    no cardiology follow  up was needed  . Anemia    iron suppliments in past-not currently  . Anemia   . Heart murmur    asymtomatic  . History of kidney stones   . IBS (irritable bowel syndrome)   . Migraine   . Nephrolithiasis   . Obesity   . PONV (postoperative nausea and vomiting)    prolonged sedation  . Vitamin D deficiency    Past Surgical History:  Procedure Laterality Date  . APPENDECTOMY  03/21/12   laproscopic appy  . DILATATION & CURETTAGE/HYSTEROSCOPY WITH MYOSURE N/A 03/20/2016   Procedure: DILATATION & CURETTAGE/HYSTEROSCOPY WITH MYOSURE;  Surgeon: Maxie Better, MD;  Location: [facility];  Service: Gynecology;  Laterality: N/A;  45 min. requested  . KNEE ARTHROSCOPY  04/24/2011   Procedure:  ARTHROSCOPY KNEE;  Surgeon: Javier Docker;  Location: [facility];  Service: Orthopedics;  Laterality: Right;  /Arthroscopy with debridement, right knee  . LAPAROSCOPIC APPENDECTOMY  03/21/2012   Procedure: APPENDECTOMY LAPAROSCOPIC;  Surgeon: Clovis Pu. Cornett, MD;  Location: [facility];  Service: General;  Laterality: N/A;  . LITHOTRIPSY    . URETHRAL STRICTURE DILATATION     as a child   Family History  Problem Relation Age of Onset  . Cancer Mother        pancreatic  . Arthritis Mother   . Hyperlipidemia Mother   . Hypertension Mother   . Cancer Father        skin  . Arthritis Father   . Hyperlipidemia Father   . Hypertension Father   . Diabetes Father   . Ankylosing spondylitis Brother   . Spondylitis Brother   . Breast cancer Paternal Aunt        50-60  . Breast cancer Paternal Aunt        70-60's   Social History   Socioeconomic History  . Marital status: Single    Spouse name: Not on file  . Number of children: Not on file  . Years of education: Not on file  . Highest education level: Not on file  Occupational History  . Not on file  Tobacco Use  . Smoking status: Never Smoker  .  Smokeless tobacco: Never Used  Substance and Sexual Activity  . Alcohol use: No    Alcohol/week: 0.0 standard drinks  . Drug use: No  . Sexual activity: Not Currently    Birth control/protection: Pill  Other Topics Concern  . Not on file  Social History Narrative  . Not on file   Social Determinants of Health   Financial Resource Strain:   . Difficulty of Paying Living Expenses:   Food Insecurity:   . Worried About Programme researcher, broadcasting/film/video in the Last Year:   . Barista in the Last Year:   Transportation Needs:   . Freight forwarder (Medical):   Marland Kitchen Lack of Transportation (Non-Medical):   Physical Activity:   . Days of Exercise per Week:   . Minutes of Exercise per Session:   Stress:   . Feeling of Stress :   Social Connections:   . Frequency of  Communication with Friends and Family:   . Frequency of Social Gatherings with Friends and Family:   . Attends Religious Services:   . Active Member of Clubs or Organizations:   . Attends Banker Meetings:   Marland Kitchen Marital Status:     Outpatient Encounter Medications as of 08/21/2019  Medication Sig  . aspirin-acetaminophen-caffeine (EXCEDRIN MIGRAINE) 250-250-65 MG tablet Take 1 tablet by mouth every 6 (six) hours as needed for headache or migraine.  . Cholecalciferol (VITAMIN D PO) Take 1 capsule by mouth every morning.  . cyclobenzaprine (FLEXERIL) 5 MG tablet Take 1 tablet by mouth 2 (two) times daily as needed.  Marland Kitchen FERREX 150 150 MG capsule TAKE 1 CAPSULE (150 MG TOTAL) BY MOUTH 2 TIMES DAILY.  Marland Kitchen ondansetron (ZOFRAN ODT) 4 MG disintegrating tablet Take 1 tablet (4 mg total) by mouth every 12 (twelve) hours as needed for nausea or vomiting.  . rizatriptan (MAXALT-MLT) 10 MG disintegrating tablet TAKE 1 TABLET BY MOUTH AS NEEDED FOR MIGRAINE. MAY REPEAT IN 2 HOURS IF NEEDED  . [DISCONTINUED] ALPRAZolam (XANAX) 0.25 MG tablet Take 1 tablet (0.25 mg total) by mouth 2 (two) times daily as needed for anxiety.  . [DISCONTINUED] benzonatate (TESSALON PERLES) 100 MG capsule Take 1 capsule (100 mg total) by mouth 3 (three) times daily as needed for cough.  . [DISCONTINUED] cefdinir (OMNICEF) 300 MG capsule Take 1 capsule (300 mg total) by mouth 2 (two) times daily.  . [DISCONTINUED] nystatin (MYCOSTATIN) 100000 UNIT/ML suspension 5cc's swish and spit tid  . [DISCONTINUED] Vitamin D, Ergocalciferol, (DRISDOL) 50000 units CAPS capsule Take 1 capsule (50,000 Units total) by mouth every 7 (seven) days.   No facility-administered encounter medications on file as of 08/21/2019.   Review of Systems  Constitutional: Negative for appetite change and unexpected weight change.  HENT: Negative for congestion and sinus pressure.   Eyes: Negative for pain and visual disturbance.  Respiratory: Negative  for cough, chest tightness and shortness of breath.   Cardiovascular: Negative for chest pain, palpitations and leg swelling.  Gastrointestinal: Negative for diarrhea, nausea and vomiting.       Abdominal discomfort and bloating as outlined.    Genitourinary: Negative for difficulty urinating and dysuria.  Musculoskeletal: Negative for joint swelling and myalgias.  Skin: Negative for color change and rash.  Neurological:       Previous dizziness.  Saw ENT.  Headache - better.    Hematological: Negative for adenopathy. Does not bruise/bleed easily.  Psychiatric/Behavioral: Negative for agitation and dysphoric mood.  Objective:    Physical Exam Constitutional:      General: She is not in acute distress.    Appearance: Normal appearance. She is well-developed.  HENT:     Head: Normocephalic and atraumatic.     Right Ear: External ear normal.     Left Ear: External ear normal.  Eyes:     General: No scleral icterus.       Right eye: No discharge.        Left eye: No discharge.     Conjunctiva/sclera: Conjunctivae normal.  Neck:     Thyroid: No thyromegaly.  Cardiovascular:     Rate and Rhythm: Normal rate and regular rhythm.  Pulmonary:     Effort: No tachypnea, accessory muscle usage or respiratory distress.     Breath sounds: Normal breath sounds. No decreased breath sounds or wheezing.  Chest:     Breasts:        Right: No inverted nipple, mass, nipple discharge or tenderness (no axillary adenopathy).        Left: No inverted nipple, mass, nipple discharge or tenderness (no axilarry adenopathy).  Abdominal:     General: Bowel sounds are normal.     Palpations: Abdomen is soft.     Tenderness: There is no abdominal tenderness.  Musculoskeletal:        General: No swelling or tenderness.     Cervical back: Neck supple. No tenderness.  Lymphadenopathy:     Cervical: No cervical adenopathy.  Skin:    Findings: No erythema or rash.  Neurological:     Mental Status:  She is alert and oriented to person, place, and time.  Psychiatric:        Mood and Affect: Mood normal.        Behavior: Behavior normal.     BP 122/70   Pulse 68   Temp 97.7 F (36.5 C)   Resp 16   Ht 5\' 5"  (1.651 m)   Wt 238 lb (108 kg)   SpO2 99%   BMI 39.61 kg/m  Wt Readings from Last 3 Encounters:  08/21/19 238 lb (108 kg)  06/19/19 220 lb (99.8 kg)  07/12/18 234 lb (106.1 kg)     Lab Results  Component Value Date   WBC 8.7 08/21/2019   HGB 12.0 08/21/2019   HCT 37.9 08/21/2019   PLT 352.0 08/21/2019   GLUCOSE 97 08/21/2019   CHOL 189 02/17/2019   TRIG 93.0 02/17/2019   HDL 50.00 02/17/2019   LDLCALC 121 (H) 02/17/2019   ALT 19 08/21/2019   AST 18 08/21/2019   NA 137 08/21/2019   K 3.9 08/21/2019   CL 105 08/21/2019   CREATININE 0.82 08/21/2019   BUN 13 08/21/2019   CO2 26 08/21/2019   TSH 1.54 08/21/2019   INR 1.09 07/14/2016    MR BRAIN/IAC W WO CONTRAST  Result Date: 04/05/2019 CLINICAL DATA:  Dizziness and left ear pain EXAM: MRI HEAD WITHOUT AND WITH CONTRAST TECHNIQUE: Multiplanar, multiecho pulse sequences of the brain and surrounding structures were obtained without and with intravenous contrast. CONTRAST:  29mL GADAVIST GADOBUTROL 1 MMOL/ML IV SOLN COMPARISON:  Brain MRI 08/20/2016 FINDINGS: BRAIN: There is no acute infarct, acute hemorrhage or extra-axial collection. The white matter signal is normal for the patient's age. The cerebral and cerebellar volume are age-appropriate. There is no hydrocephalus. The midline structures are normal. INTERNAL AUDITORY CANALS: There is no cerebellopontine angle mass. The cochleae and semicircular canals are normal. No focal abnormality along  the course of the 7th and 8th cranial nerves. Normal porus acusticus and vestibular aqueduct bilaterally. VASCULAR: The major intracranial arterial and venous sinus flow voids are normal. Susceptibility-sensitive sequences show no chronic microhemorrhage or superficial  siderosis. SKULL AND UPPER CERVICAL SPINE: Calvarial bone marrow signal is normal. There is no skull base mass. The visualized upper cervical spine and soft tissues are normal. SINUSES/ORBITS: There are no fluid levels or advanced mucosal thickening. The mastoid air cells and middle ear cavities are free of fluid. The orbits are normal. IMPRESSION: Normal MRI of the brain and internal auditory canals. Electronically Signed   By: Ulyses Jarred M.D.   On: 04/05/2019 01:47       Assessment & Plan:   Problem List Items Addressed This Visit    Abdominal bloating    Abdominal bloating and abdominal discomfort and nausea as outlined.  Persistent with bowel change as outlined.  Check CT abdomen and pelvis.        Relevant Orders   Hepatic function panel (Completed)   TSH (Completed)   Basic metabolic panel (Completed)   CT Abdomen Pelvis W Contrast   Anemia - Primary    Taking iron.  Recheck cbc and iron studies.        Relevant Orders   IBC + Ferritin (Completed)   CBC with Differential/Platelet (Completed)   DUB (dysfunctional uterine bleeding)    S/p D&C previously.  Bleeding as outlined.  Just started in March.  Keep menstrual diary.  Check routine labs to confirm no metabolic etiology.  Check urine pregnancy test.  Denies possibility of being pregnant.        Health care maintenance    Physical today 08/22/19.  PAP 07/12/18 - negative with negative HPV.        History of COVID-19    Previous history of covid.  Does feel better.  Had some bowel issues with covid.  Given persistent symptoms, check CT abdomen and pelvis.        Migraine    Has a history of migraines.  Seeing neurology.  Stable.       Nausea   Stress    Overall handling things relatively well. Does not feel needs any further intervention.  Follow.       Vitamin D deficiency    Follow vitamin D level        Other Visit Diagnoses    Abdominal distension (gaseous)       Relevant Orders   CT Abdomen Pelvis W  Contrast   Menstrual changes       Relevant Orders   POCT urine pregnancy (Completed)       Einar Pheasant, MD

## 2019-08-22 ENCOUNTER — Encounter: Payer: Self-pay | Admitting: Internal Medicine

## 2019-08-23 ENCOUNTER — Telehealth: Payer: Self-pay | Admitting: Internal Medicine

## 2019-08-23 NOTE — Telephone Encounter (Signed)
I called pt and left a vm about appt time date location and instructions.

## 2019-08-27 ENCOUNTER — Encounter: Payer: Self-pay | Admitting: Internal Medicine

## 2019-08-27 NOTE — Assessment & Plan Note (Signed)
Abdominal bloating and abdominal discomfort and nausea as outlined.  Persistent with bowel change as outlined.  Check CT abdomen and pelvis.

## 2019-08-27 NOTE — Assessment & Plan Note (Signed)
Follow vitamin D level.  

## 2019-08-27 NOTE — Assessment & Plan Note (Signed)
Overall handling things relatively well.  Does not feel needs any further intervention.  Follow.   

## 2019-08-27 NOTE — Assessment & Plan Note (Signed)
Has a history of migraines.  Seeing neurology.  Stable.

## 2019-08-27 NOTE — Assessment & Plan Note (Signed)
S/p D&C previously.  Bleeding as outlined.  Just started in March.  Keep menstrual diary.  Check routine labs to confirm no metabolic etiology.  Check urine pregnancy test.  Denies possibility of being pregnant.

## 2019-08-27 NOTE — Assessment & Plan Note (Signed)
Previous history of covid.  Does feel better.  Had some bowel issues with covid.  Given persistent symptoms, check CT abdomen and pelvis.

## 2019-08-27 NOTE — Assessment & Plan Note (Addendum)
Physical today 08/22/19.  PAP 07/12/18 - negative with negative HPV.

## 2019-08-27 NOTE — Assessment & Plan Note (Signed)
Taking iron.  Recheck cbc and iron studies.

## 2019-08-30 ENCOUNTER — Encounter: Payer: Self-pay | Admitting: Internal Medicine

## 2019-09-01 ENCOUNTER — Ambulatory Visit: Payer: 59

## 2019-09-05 ENCOUNTER — Other Ambulatory Visit: Payer: 59

## 2019-09-05 MED FILL — RIZATRIPTAN BENZOATE 10 MG: 10 | 30 days supply | Qty: 10 | Fill #0

## 2019-09-05 MED FILL — EMGALITY 120 MG/ML SOAJ: 120 | 28 days supply | Qty: 1 | Fill #6

## 2019-09-05 MED FILL — ONDANSETRON HCL 8 MG TABLET: 8 | 7 days supply | Qty: 20 | Fill #0

## 2019-09-05 MED FILL — FERREX 150 CAPSULE: 150 | 30 days supply | Qty: 60 | Fill #1

## 2019-09-08 MED FILL — NURTEC 75 MG TBDP: 75 | 30 days supply | Qty: 8 | Fill #0

## 2019-09-21 DIAGNOSIS — F411 Generalized anxiety disorder: Secondary | ICD-10-CM | POA: Diagnosis not present

## 2019-09-22 ENCOUNTER — Ambulatory Visit: Payer: 59 | Admitting: Internal Medicine

## 2019-09-26 DIAGNOSIS — F5104 Psychophysiologic insomnia: Secondary | ICD-10-CM | POA: Diagnosis not present

## 2019-09-26 DIAGNOSIS — G43111 Migraine with aura, intractable, with status migrainosus: Secondary | ICD-10-CM | POA: Diagnosis not present

## 2019-09-26 DIAGNOSIS — M5481 Occipital neuralgia: Secondary | ICD-10-CM | POA: Diagnosis not present

## 2019-10-09 DIAGNOSIS — M5481 Occipital neuralgia: Secondary | ICD-10-CM | POA: Diagnosis not present

## 2019-10-17 MED FILL — NURTEC 75 MG TBDP: 75 | 30 days supply | Qty: 8 | Fill #1

## 2019-10-17 MED FILL — FERREX 150 CAPSULE: 150 | 30 days supply | Qty: 60 | Fill #2

## 2019-10-17 MED FILL — EMGALITY 120 MG/ML SOAJ: 120 | 28 days supply | Qty: 1 | Fill #7

## 2019-10-25 ENCOUNTER — Ambulatory Visit: Payer: 59 | Attending: Neurology

## 2019-10-25 ENCOUNTER — Other Ambulatory Visit: Payer: Self-pay

## 2019-10-25 DIAGNOSIS — R252 Cramp and spasm: Secondary | ICD-10-CM | POA: Diagnosis not present

## 2019-10-25 DIAGNOSIS — M542 Cervicalgia: Secondary | ICD-10-CM | POA: Insufficient documentation

## 2019-10-26 NOTE — Therapy (Signed)
[facility] [address], [place], [address] Phone: [phone]   Fax:  [phone]  Physical Therapy Evaluation  Patient Details  Name: Tina Fleming MRN: [id number] Date of Birth: 01-02-1981 Referring Provider (PT): Lorin Picket MD   Encounter Date: 10/25/2019  PT End of Session - 10/26/19 0830    Visit Number  1    Number of Visits  13    Date for PT Re-Evaluation  12/06/19    PT Start Time  1000    PT Stop Time  1045    PT Time Calculation (min)  45 min    Activity Tolerance  Patient tolerated treatment well    Behavior During Therapy  [facility] for tasks assessed/performed       Past Medical History:  Diagnosis Date  . Abnormal EKG    no cardiology follow  up was needed  . Anemia    iron suppliments in past-not currently  . Anemia   . Heart murmur    asymtomatic  . History of kidney stones   . IBS (irritable bowel syndrome)   . Migraine   . Nephrolithiasis   . Obesity   . PONV (postoperative nausea and vomiting)    prolonged sedation  . Vitamin D deficiency     Past Surgical History:  Procedure Laterality Date  . APPENDECTOMY  03/21/12   laproscopic appy  . DILATATION & CURETTAGE/HYSTEROSCOPY WITH MYOSURE N/A 03/20/2016   Procedure: DILATATION & CURETTAGE/HYSTEROSCOPY WITH MYOSURE;  Surgeon: Maxie Better, MD;  Location: [facility];  Service: Gynecology;  Laterality: N/A;  45 min. requested  . KNEE ARTHROSCOPY  04/24/2011   Procedure: ARTHROSCOPY KNEE;  Surgeon: Javier Docker;  Location: [facility];  Service: Orthopedics;  Laterality: Right;  /Arthroscopy with debridement, right knee  . LAPAROSCOPIC APPENDECTOMY  03/21/2012   Procedure: APPENDECTOMY LAPAROSCOPIC;  Surgeon: Clovis Pu. Cornett, MD;  Location: [facility];  Service: General;  Laterality: N/A;  . LITHOTRIPSY    . URETHRAL STRICTURE DILATATION     as a child    There were no vitals filed for this visit.   Subjective  Assessment - 10/26/19 0732    Subjective  Patient reports increased neck pain that began four years prior. Patient states she also has a history of head aches which has been impacting her ability to work and function overall. Patient states increased pain with performing head movements, most specifically rotation and performing cervical flexion. Patient states she has been out of work secondary to increased neck pain and head aches and this is the case until the week of June 14th 2021. Patient states she has been using ice with minimal improvement and managing with pain with pain medications where necessary. Increase in pain 4 months prior without moi. Patient states she had recently receive a occipital nerve block which helped minimally with her symptoms. Patient states she would like to decrease her pain overall so she can return to work.    Pertinent History  4 year chronic headaches and neck pain; Increased pain as day progresses; states she has days without pain, but are rare as she has not any pain free days since the previous session    Limitations  Lifting    Diagnostic tests  MRI, Xray    Patient Stated Goals  Decrease pain    Currently in Pain?  Yes    Pain Score  6    worst: 8/10; best 0/10  Pain Location  Head    Pain Orientation  Lower;Posterior    Pain Descriptors / Indicators  Aching    Pain Type  Chronic pain    Pain Onset  More than a month ago    Pain Frequency  Intermittent         [facility] PT Assessment - 10/26/19 0001      Assessment   Medical Diagnosis  head/neck pain    Referring Provider (PT)  Nicki Reaper MD    Onset Date/Surgical Date  05/26/15    Hand Dominance  Right    Next MD Visit  unknown    Prior Therapy  no      Balance Screen   Has the patient fallen in the past 6 months  No    Has the patient had a decrease in activity level because of a fear of falling?   Yes    Is the patient reluctant to leave their home because of a fear of falling?   No      Home  Environment   Living Environment  Private residence    Living Arrangements  Alone    Available Help at Discharge  Family    Type of [place]      Prior Function   Level of Independence  Independent    Vocation  Full time employment   *Not working until 11/09/19   Vocation Requirements  Nurse; bending, lifting    Leisure  being with friends      Cognition   Overall Cognitive Status  Within Functional Limits for tasks assessed      Observation/Other Assessments   Observations  Increased lower cervical lordosis, increased thoracic superior kyphosis    Focus on Therapeutic Outcomes (FOTO)   --   Will do NV     Sensation   Light Touch  Appears Intact      Posture/Postural Control   Posture Comments  FHP, increased thoracic kyphosis      ROM / Strength   AROM / PROM / Strength  AROM;Strength      AROM   Overall AROM Comments  Decreased cervical retraction    AROM Assessment Site  Cervical    Cervical Flexion  WNL increased pain    Cervical Extension  25% limited     Cervical - Right Side Bend  25% limited - pain    Cervical - Left Side Bend  25% limited - pain    Cervical - Right Rotation  WNL    Cervical - Left Rotation  WNL      Strength   Overall Strength Comments  Increased pain with cervical flexion, extension, and retraction, retraction 5/5    Strength Assessment Site  Cervical    Cervical Flexion  5/5    Cervical Extension  5/5    Cervical - Right Side Bend  5/5    Cervical - Left Side Bend  5/5    Cervical - Right Rotation  5/5    Cervical - Left Rotation  5/5      Palpation   Spinal mobility  Cervical hypomobility: C1-3 B and centrally    Palpation comment  TTP along suboccipital muscular, cervical extensors along upper cervical spine      Special Tests    Special Tests  Cervical    Cervical Tests  Dictraction;Spurling's      Spurling's   Findings  Positive    Comment  B - Pain      Distraction  Test   Findngs  Positive    Comment  B - alleviation  of pain          Objective measurements completed on examination: See above findings.   TREATMENT Therapeutic Exercise Cervical retraction in sitting with OP -- x 20  Cervical retraction in sitting with towel -- x 20  Scapular retraction in sitting -- x 20  Performed exercises to decrease in pain and spasms  PT Education - 10/26/19 0829    Education Details  HEP: resisted cervical retraction into towel, cervical retraction in sitting with finger OP    Person(s) Educated  Patient    Methods  Demonstration;Explanation    Comprehension  Verbalized understanding;Returned demonstration       PT Short Term Goals - 10/26/19 0839      PT SHORT TERM GOAL #1   Title  Patient will be independent with HEP to continue benefits of therapy after discharge.    Baseline  Dependent with HEP    Time  2    Period  Weeks    Status  New    Target Date  11/08/19        PT Long Term Goals - 10/26/19 0840      PT LONG TERM GOAL #1   Title  Patinet will be independent with HEP to continue benefits of therapy until after discharge.    Baseline  Dependent with HEP    Time  6    Period  Weeks    Status  New    Target Date  12/06/19      PT LONG TERM GOAL #2   Title  Patient will have a significant improvement in her FOTO score to indicate improvement with cervical function and stability.    Baseline  FOTO: Assess NV    Time  6    Period  Weeks    Status  New    Target Date  12/06/19      PT LONG TERM GOAL #3   Title  Patient will have a worst pain of 2/10 to indicate significant improvement with pain and spasms and allow for performance of work related activities    Baseline  8/10    Time  6    Period  Weeks    Status  New    Target Date  12/06/19      PT LONG TERM GOAL #4   Title  Patient will be able return to occupational tasks without signifcant difficulty and pain to better return to job related duties    Baseline  not currently working secondary to pain    Time  6     Period  Weeks    Status  New    Target Date  12/06/19             Plan - 10/26/19 9628    Clinical Impression Statement  Patient is a 39 yo right hand dominant female presenting with increased pain and spasms along the cervical spine/ suboccipital region from insidious onset 4 years prior. Patient demonstrates cervical dysfunction as indicated by TTP along the suboccipital musculature, decreased and painful upper cervical rotation, and decreased cervical retraction ability with increased pain. Patient demonstrates decreased cervical rotational AROM as well. Patient will benefit from further skilled therapy to return to prior level of function.    Personal Factors and Comorbidities  Comorbidity 2    Comorbidities  Chronic HA    Examination-Activity Limitations  Carry;Sit    Examination-Participation  Restrictions  [employer]   Work   Stability/Clinical Decision Making  Evolving/Moderate complexity    Clinical Decision Making  Moderate    Rehab Potential  Good    PT Frequency  2x / week    PT Duration  6 weeks    PT Treatment/Interventions  Joint Manipulations;Spinal Manipulations;Dry needling;Passive range of motion;Patient/family education;Manual techniques;Therapeutic exercise;Neuromuscular re-education;Canalith Repostioning;Electrical Stimulation;Moist Heat    PT Next Visit Plan  progress cervical retraction    PT Home Exercise Plan  See education section    Consulted and Agree with Plan of Care  Patient       Patient will benefit from skilled therapeutic intervention in order to improve the following deficits and impairments:  Pain, Postural dysfunction, Increased muscle spasms, Increased fascial restricitons, Hypomobility, Decreased strength, Decreased range of motion, Decreased mobility, Decreased coordination, Decreased endurance, Decreased activity tolerance  Visit Diagnosis: Cervicalgia  Cramp and spasm     Problem List Patient Active Problem List   Diagnosis  Date Noted  . Nausea 08/21/2019  . Abdominal bloating 08/21/2019  . History of COVID-19 06/24/2019  . URI (upper respiratory infection) 08/26/2018  . Thyroid nodule 02/27/2018  . Stress 01/10/2017  . DUB (dysfunctional uterine bleeding) 07/16/2016  . Low back pain 04/30/2015  . Fatigue 02/08/2015  . Skin lesion of breast 08/06/2014  . Health care maintenance 08/06/2014  . Nephrolithiasis 02/11/2013  . Migraine 02/11/2013  . History of frequent urinary tract infections 02/11/2013  . Syncope 02/11/2013  . IBS (irritable bowel syndrome) 02/11/2013  . Anemia 02/11/2013  . Vitamin D deficiency 02/11/2013    Myrene Galas, PT DPT 10/26/2019, 12:15 PM  [facility] [address] [place], [address] Phone: [phone]   Fax:  [phone]  Name: Tina Fleming MRN: [id number] Date of Birth: 08-04-1980

## 2019-10-30 ENCOUNTER — Ambulatory Visit: Payer: 59

## 2019-10-30 ENCOUNTER — Other Ambulatory Visit: Payer: Self-pay

## 2019-10-30 DIAGNOSIS — M542 Cervicalgia: Secondary | ICD-10-CM

## 2019-10-30 DIAGNOSIS — R252 Cramp and spasm: Secondary | ICD-10-CM

## 2019-10-30 NOTE — Therapy (Signed)
[facility] [address] [place], [address] Phone: [phone]   Fax:  [phone]  Physical Therapy Treatment  Patient Details  Name: Tina Fleming MRN: [id number] Date of Birth: 12/27/1980 Referring Provider (PT): Lorin Picket MD   Encounter Date: 10/30/2019  PT End of Session - 10/30/19 0909    Visit Number  2    Number of Visits  13    Date for PT Re-Evaluation  12/06/19    PT Start Time  0900    PT Stop Time  0940    PT Time Calculation (min)  40 min    Activity Tolerance  No increased pain;Patient tolerated treatment well    Behavior During Therapy  [facility] for tasks assessed/performed       Past Medical History:  Diagnosis Date  . Abnormal EKG    no cardiology follow  up was needed  . Anemia    iron suppliments in past-not currently  . Anemia   . Heart murmur    asymtomatic  . History of kidney stones   . IBS (irritable bowel syndrome)   . Migraine   . Nephrolithiasis   . Obesity   . PONV (postoperative nausea and vomiting)    prolonged sedation  . Vitamin D deficiency     Past Surgical History:  Procedure Laterality Date  . APPENDECTOMY  03/21/12   laproscopic appy  . DILATATION & CURETTAGE/HYSTEROSCOPY WITH MYOSURE N/A 03/20/2016   Procedure: DILATATION & CURETTAGE/HYSTEROSCOPY WITH MYOSURE;  Surgeon: Maxie Better, MD;  Location: [facility];  Service: Gynecology;  Laterality: N/A;  45 min. requested  . KNEE ARTHROSCOPY  04/24/2011   Procedure: ARTHROSCOPY KNEE;  Surgeon: Javier Docker;  Location: [facility];  Service: Orthopedics;  Laterality: Right;  /Arthroscopy with debridement, right knee  . LAPAROSCOPIC APPENDECTOMY  03/21/2012   Procedure: APPENDECTOMY LAPAROSCOPIC;  Surgeon: Clovis Pu. Cornett, MD;  Location: [facility];  Service: General;  Laterality: N/A;  . LITHOTRIPSY    . URETHRAL STRICTURE DILATATION     as a child    There were no vitals filed for this  visit.  Subjective Assessment - 10/30/19 0907    Subjective  Pt reports some pain exacerbation after evaluation visit, up to 7//10 improved with ice, today pt lower in pain at 2/10 upper cervical. Pt reports success and compliance with HEP thus far, but did take time off after her eval due to pain.    Pertinent History  Patient reports increased neck pain that began four years prior. Patient states she also has a history of head aches which has been impacting her ability to work and function overall. Patient states increased pain with performing head movements, most specifically rotation and performing cervical flexion. Patient states she has been out of work secondary to increased neck pain and head aches and this is the case until the week of June 14th 2021. Patient states she has been using ice with minimal improvement and managing with pain with pain medications where necessary. Increase in pain 4 months prior without moi. Patient states she had recently receive a occipital nerve block which helped minimally with her symptoms. Patient states she would like to decrease her pain overall so she can return to work.    Currently in Pain?  Yes    Pain Score  2     Pain Location  --   Left upper cervical      INTERVENTION THIS DATE: -  FOTO: 53/100 -review of HEP *seated cervical retraction with manual mental overpressure 1x15 (visibly restricted on Left at end range)  *seated cervical rotation with towel overpressure 1x15 bilat, cues for improved utility of towel *seated scapular retraction 20x3secH, appears correct (left retraction appears restricted by 25% at end range)   -seated capital flexion/suboccipital stretch 3x30sec, bimanual overpressure  -seated capital flexion+Right rotation /suboccipital stretch 3x30sec, bimanual overpressure  -seated capital flexion self mobilization (rotation + nod)  -seated capital rotation self mobilization (flexion + rotation)  -Grade 3 Left C2 lateral glide  1x30sec -grade 3 Left C2/3 lateral glide 1x30sec, in neutral -grade 3 Left C2/3 lateral glide 1x30sec,  With increased side glide and Left opening  -supine retraction into 2 pillows 15x3secH   -Manual release of left suboccipitals, C2/3 cervical extensors    PT Short Term Goals - 10/26/19 0839      PT SHORT TERM GOAL #1   Title  Patient will be independent with HEP to continue benefits of therapy after discharge.    Baseline  Dependent with HEP    Time  2    Period  Weeks    Status  New    Target Date  11/08/19        PT Long Term Goals - 10/26/19 0840      PT LONG TERM GOAL #1   Title  Patinet will be independent with HEP to continue benefits of therapy until after discharge.    Baseline  Dependent with HEP    Time  6    Period  Weeks    Status  New    Target Date  12/06/19      PT LONG TERM GOAL #2   Title  Patient will have a significant improvement in her FOTO score to indicate improvement with cervical function and stability.    Baseline  FOTO: Assess NV    Time  6    Period  Weeks    Status  New    Target Date  12/06/19      PT LONG TERM GOAL #3   Title  Patient will have a worst pain of 2/10 to indicate significant improvement with pain and spasms and allow for performance of work related activities    Baseline  8/10    Time  6    Period  Weeks    Status  New    Target Date  12/06/19      PT LONG TERM GOAL #4   Title  Patient will be able return to occupational tasks without signifcant difficulty and pain to better return to job related duties    Baseline  not currently working secondary to pain    Time  6    Period  Weeks    Status  New    Target Date  12/06/19            Plan - 10/30/19 0910    Clinical Impression Statement  Reviewed HEP with patient and gave FOTO outcomes survey. Pt reported mild pain exacerbation after evaluation visit with good resolution. Continued with progressing muscle stretch and mobility of upper cervical area, with  additional education on motor control. No pain exacerbation in session, but pt has some aggravation of Left ear pain with 3rd capital flexion stretch. Pt continues to make progress toward all treatment goals.    Personal Factors and Comorbidities  Comorbidity 2    Comorbidities  Chronic HA    Examination-Activity Limitations  Carry;Sit  Examination-Participation Restrictions  Tour manager    Stability/Clinical Decision Making  Evolving/Moderate complexity    Clinical Decision Making  Moderate    Rehab Potential  Good    PT Frequency  1x / week    PT Duration  6 weeks    PT Treatment/Interventions  Joint Manipulations;Spinal Manipulations;Dry needling;Passive range of motion;Patient/family education;Manual techniques;Therapeutic exercise;Neuromuscular re-education;Canalith Repostioning;Electrical Stimulation;Moist Heat    PT Next Visit Plan  progress cervical retraction    PT Home Exercise Plan  See education section    Consulted and Agree with Plan of Care  Patient       Patient will benefit from skilled therapeutic intervention in order to improve the following deficits and impairments:  Pain, Postural dysfunction, Increased muscle spasms, Increased fascial restricitons, Hypomobility, Decreased strength, Decreased range of motion, Decreased mobility, Decreased coordination, Decreased endurance, Decreased activity tolerance  Visit Diagnosis: Cervicalgia  Cramp and spasm     Problem List Patient Active Problem List   Diagnosis Date Noted  . Nausea 08/21/2019  . Abdominal bloating 08/21/2019  . History of COVID-19 06/24/2019  . URI (upper respiratory infection) 08/26/2018  . Thyroid nodule 02/27/2018  . Stress 01/10/2017  . DUB (dysfunctional uterine bleeding) 07/16/2016  . Low back pain 04/30/2015  . Fatigue 02/08/2015  . Skin lesion of breast 08/06/2014  . Health care maintenance 08/06/2014  . Nephrolithiasis 02/11/2013  . Migraine 02/11/2013  . History of frequent  urinary tract infections 02/11/2013  . Syncope 02/11/2013  . IBS (irritable bowel syndrome) 02/11/2013  . Anemia 02/11/2013  . Vitamin D deficiency 02/11/2013   9:44 AM, 10/30/19 Etta Grandchild, PT, DPT Physical Therapist - [place] [phone] (Office)    Ashtin Melichar C 10/30/2019, 9:32 AM  [facility] [address], [place], [address] Phone: [phone]   Fax:  [phone]  Name: SHARNITA BOGUCKI MRN: [id number] Date of Birth: Feb 14, 1981

## 2019-11-02 ENCOUNTER — Ambulatory Visit: Payer: 59

## 2019-11-02 ENCOUNTER — Other Ambulatory Visit: Payer: Self-pay

## 2019-11-02 DIAGNOSIS — M542 Cervicalgia: Secondary | ICD-10-CM | POA: Diagnosis not present

## 2019-11-02 DIAGNOSIS — R252 Cramp and spasm: Secondary | ICD-10-CM

## 2019-11-02 NOTE — Therapy (Signed)
[place] [address], [place], [address] Phone: [phone]   Fax:  [phone]  Physical Therapy Treatment  Patient Details  Name: Tina Fleming MRN: [id number] Date of Birth: 09-16-1980 Referring Provider (PT): Nicki Reaper MD   Encounter Date: 11/02/2019   PT End of Session - 11/02/19 0909    Visit Number 3    Number of Visits 13    Date for PT Re-Evaluation 12/06/19    PT Start Time 0900    PT Stop Time 0945    PT Time Calculation (min) 45 min    Activity Tolerance No increased pain;Patient tolerated treatment well    Behavior During Therapy [facility] for tasks assessed/performed           Past Medical History:  Diagnosis Date  . Abnormal EKG    no cardiology follow  up was needed  . Anemia    iron suppliments in past-not currently  . Anemia   . Heart murmur    asymtomatic  . History of kidney stones   . IBS (irritable bowel syndrome)   . Migraine   . Nephrolithiasis   . Obesity   . PONV (postoperative nausea and vomiting)    prolonged sedation  . Vitamin D deficiency     Past Surgical History:  Procedure Laterality Date  . APPENDECTOMY  03/21/12   laproscopic appy  . DILATATION & CURETTAGE/HYSTEROSCOPY WITH MYOSURE N/A 03/20/2016   Procedure: [place];  Surgeon: Servando Salina, MD;  Location: [facility];  Service: Gynecology;  Laterality: N/A;  45 min. requested  . KNEE ARTHROSCOPY  04/24/2011   Procedure: ARTHROSCOPY KNEE;  Surgeon: Johnn Hai;  Location: [facility];  Service: Orthopedics;  Laterality: Right;  /Arthroscopy with debridement, right knee  . LAPAROSCOPIC APPENDECTOMY  03/21/2012   Procedure: APPENDECTOMY LAPAROSCOPIC;  Surgeon: Joyice Faster. Cornett, MD;  Location: [facility];  Service: General;  Laterality: N/A;  . LITHOTRIPSY    . URETHRAL STRICTURE DILATATION     as a child    There were no vitals filed for this  visit.   Subjective Assessment - 11/02/19 0906    Subjective Pt doing well today, no pain this date. Had some soreness after interventions on Monday, but next day all soreness was fully resolved. Pt is interested in trial of dry needling this date as previously discussed. Pt reports no issues with HEP at this time.    Pertinent History Patient reports increased neck pain that began four years prior. Patient states she also has a history of head aches which has been impacting her ability to work and function overall. Patient states increased pain with performing head movements, most specifically rotation and performing cervical flexion. Patient states she has been out of work secondary to increased neck pain and head aches and this is the case until the week of June 14th 2021. Patient states she has been using ice with minimal improvement and managing with pain with pain medications where necessary. Increase in pain 4 months prior without moi. Patient states she had recently receive a occipital nerve block which helped minimally with her symptoms. Patient states she would like to decrease her pain overall so she can return to work.    Limitations Lifting    Currently in Pain? No/denies            INTERVENTION THIS DATE:  *all performed post needling (~5 minutes, 2x .25x87m, 2x .30x477m  -  Left MFR sustained release to suboccipital group -MFR to Left cervical extensor group C3-C5 -Left C0/1 AO mobilization grade 3, 2x60sec -Supine Capital flexions P/ROM stretch, author mediated, 1x60sec -Supine Muscle [employer] Techniques for capital rotation: 5sec Left occular gaze cued, followed by 5x30sec rotation stretch (end ROM close to 60-65 degrees) -manual cervical traction 3x30sec  -Cervical retraction in supine into two pillows 20x3sec, cues for neutral chin position, strengthening and motor control training of deep neck flexors.      Trigger Point Dry Needling - 11/02/19 0001    Consent Given? Yes     Education Handout Provided Yes    Muscles Treated Head and Neck Oblique capitus;Suboccipitals;Splenius capitus;Semispinalis capitus    Oblique Capitus Response Twitch response elicited;Palpable increased muscle length   Left   Suboccipitals Response Twitch response elicited;Palpable increased muscle length   Left   Splenius capitus Response Twitch reponse elicited;Palpable increased muscle length   Left   Semispinalis capitus Response Twitch reponse elicited;Palpable increased muscle length   Left                 PT Short Term Goals - 10/26/19 0839      PT SHORT TERM GOAL #1   Title Patient will be independent with HEP to continue benefits of therapy after discharge.    Baseline Dependent with HEP    Time 2    Period Weeks    Status New    Target Date 11/08/19             PT Long Term Goals - 10/26/19 0840      PT LONG TERM GOAL #1   Title Patinet will be independent with HEP to continue benefits of therapy until after discharge.    Baseline Dependent with HEP    Time 6    Period Weeks    Status New    Target Date 12/06/19      PT LONG TERM GOAL #2   Title Patient will have a significant improvement in her FOTO score to indicate improvement with cervical function and stability.    Baseline FOTO: Assess NV    Time 6    Period Weeks    Status New    Target Date 12/06/19      PT LONG TERM GOAL #3   Title Patient will have a worst pain of 2/10 to indicate significant improvement with pain and spasms and allow for performance of work related activities    Baseline 8/10    Time 6    Period Weeks    Status New    Target Date 12/06/19      PT LONG TERM GOAL #4   Title Patient will be able return to occupational tasks without signifcant difficulty and pain to better return to job related duties    Baseline not currently working secondary to pain    Time 6    Period Weeks    Status New    Target Date 12/06/19                 Plan - 11/02/19 0943     Clinical Impression Statement Pt gives consent for dry needling, which is directed toward left upper cervical region. Needling tolerated well in general, followed immediately by extensive manual therapy directed at soft tissue release and hypomobile joint segments. Concluded session with strengthening and motor control training. Pt continues to make progress toward goals overall.    Personal Factors and Comorbidities Comorbidity 2    Comorbidities  Chronic HA    Examination-Activity Limitations Carry;Sit    Examination-Participation Restrictions Tour manager    Stability/Clinical Decision Making Evolving/Moderate complexity    Clinical Decision Making Moderate    Rehab Potential Good    PT Frequency 2x / week    PT Duration 6 weeks    PT Treatment/Interventions Joint Manipulations;Spinal Manipulations;Dry needling;Passive range of motion;Patient/family education;Manual techniques;Therapeutic exercise;Neuromuscular re-education;Canalith Repostioning;Electrical Stimulation;Moist Heat    PT Next Visit Plan FU on response to TPDN; revisit MET for Rt capital rotation, screen DNF endurance    PT Home Exercise Plan See education section    Consulted and Agree with Plan of Care Patient           Patient will benefit from skilled therapeutic intervention in order to improve the following deficits and impairments:  Pain, Postural dysfunction, Increased muscle spasms, Increased fascial restricitons, Hypomobility, Decreased strength, Decreased range of motion, Decreased mobility, Decreased coordination, Decreased endurance, Decreased activity tolerance  Visit Diagnosis: Cervicalgia  Cramp and spasm     Problem List Patient Active Problem List   Diagnosis Date Noted  . Nausea 08/21/2019  . Abdominal bloating 08/21/2019  . History of COVID-19 06/24/2019  . URI (upper respiratory infection) 08/26/2018  . Thyroid nodule 02/27/2018  . Stress 01/10/2017  . DUB (dysfunctional uterine bleeding)  07/16/2016  . Low back pain 04/30/2015  . Fatigue 02/08/2015  . Skin lesion of breast 08/06/2014  . Health care maintenance 08/06/2014  . Nephrolithiasis 02/11/2013  . Migraine 02/11/2013  . History of frequent urinary tract infections 02/11/2013  . Syncope 02/11/2013  . IBS (irritable bowel syndrome) 02/11/2013  . Anemia 02/11/2013  . Vitamin D deficiency 02/11/2013   10:04 AM, 11/02/19 Etta Grandchild, PT, DPT Physical Therapist - [place] [phone] (Office)    Buccola,Allan C 11/02/2019, 9:53 AM  [facility] [address], [place], [address] Phone: [phone]   Fax:  [phone]  Name: JOHAN ANTONACCI MRN: [id number] Date of Birth: 1980-10-13

## 2019-11-02 NOTE — Patient Instructions (Signed)

## 2019-11-09 ENCOUNTER — Other Ambulatory Visit: Payer: Self-pay

## 2019-11-09 ENCOUNTER — Ambulatory Visit: Payer: 59

## 2019-11-09 DIAGNOSIS — R252 Cramp and spasm: Secondary | ICD-10-CM

## 2019-11-09 DIAGNOSIS — M542 Cervicalgia: Secondary | ICD-10-CM

## 2019-11-09 NOTE — Therapy (Signed)
[facility] [address] [place], [address] Phone: [phone]   Fax:  [phone]  Physical Therapy Treatment  Patient Details  Name: JURLINE FOLGER MRN: [id number] Date of Birth: [birth year] Referring Provider (PT): Lorin Picket MD   Encounter Date: 11/09/2019   PT End of Session - 11/09/19 0934    Visit Number 4    Number of Visits 13    Date for PT Re-Evaluation 12/06/19    PT Start Time 0902    PT Stop Time 0942    PT Time Calculation (min) 40 min    Activity Tolerance No increased pain;Patient tolerated treatment well    Behavior During Therapy [facility] for tasks assessed/performed           Past Medical History:  Diagnosis Date  . Abnormal EKG    no cardiology follow  up was needed  . Anemia    iron suppliments in past-not currently  . Anemia   . Heart murmur    asymtomatic  . History of kidney stones   . IBS (irritable bowel syndrome)   . Migraine   . Nephrolithiasis   . Obesity   . PONV (postoperative nausea and vomiting)    prolonged sedation  . Vitamin D deficiency     Past Surgical History:  Procedure Laterality Date  . APPENDECTOMY  03/21/12   laproscopic appy  . DILATATION & CURETTAGE/HYSTEROSCOPY WITH MYOSURE N/A 03/20/2016   Procedure: DILATATION & CURETTAGE/HYSTEROSCOPY WITH MYOSURE;  Surgeon: Maxie Better, MD;  Location: [facility];  Service: Gynecology;  Laterality: N/A;  45 min. requested  . KNEE ARTHROSCOPY  04/24/2011   Procedure: ARTHROSCOPY KNEE;  Surgeon: Javier Docker;  Location: [facility];  Service: Orthopedics;  Laterality: Right;  /Arthroscopy with debridement, right knee  . LAPAROSCOPIC APPENDECTOMY  03/21/2012   Procedure: APPENDECTOMY LAPAROSCOPIC;  Surgeon: Clovis Pu. Cornett, MD;  Location: [facility];  Service: General;  Laterality: N/A;  . LITHOTRIPSY    . URETHRAL STRICTURE DILATATION     as a child    There were no vitals filed for this  visit.   Subjective Assessment - 11/09/19 0908    Subjective Pt doing well today, returned to work on Tuesday, had a HA halfway through with ear pain. Pt said needling response was favorable, would like to try again.    Pertinent History Patient reports increased neck pain that began four years prior. Patient states she also has a history of head aches which has been impacting her ability to work and function overall. Patient states increased pain with performing head movements, most specifically rotation and performing cervical flexion. Patient states she has been out of work secondary to increased neck pain and head aches and this is the case until the week of June 14th 2021. Patient states she has been using ice with minimal improvement and managing with pain with pain medications where necessary. Increase in pain 4 months prior without moi. Patient states she had recently receive a occipital nerve block which helped minimally with her symptoms. Patient states she would like to decrease her pain overall so she can return to work.    Limitations Lifting    Diagnostic tests MRI, Xray    Patient Stated Goals Decrease pain    Currently in Pain? No/denies           INTERVENTION THIS DATE: Manual Therapy   -TPDN to Left suboccipital group  *all performed post needling (~3  minutes, 4x 0.30x2mm)  -Left MFR sustained release to suboccipital group -Left C0/1 AO mobilization grade 3, 2x60sec   Therapeutic Exercise -prone W @ 2x15x1secH -Quadruped UE reach (0.5 birddog) 1x15 bilat, alternating pattern -seated capital flexion suboccipital stretch 2x30 (asked to try at work when symptoms flare)     PT Short Term Goals - 10/26/19 0839      PT SHORT TERM GOAL #1   Title Patient will be independent with HEP to continue benefits of therapy after discharge.    Baseline Dependent with HEP    Time 2    Period Weeks    Status New    Target Date 11/08/19             PT Long Term Goals -  10/26/19 0840      PT LONG TERM GOAL #1   Title Patinet will be independent with HEP to continue benefits of therapy until after discharge.    Baseline Dependent with HEP    Time 6    Period Weeks    Status New    Target Date 12/06/19      PT LONG TERM GOAL #2   Title Patient will have a significant improvement in her FOTO score to indicate improvement with cervical function and stability.    Baseline FOTO: Assess NV    Time 6    Period Weeks    Status New    Target Date 12/06/19      PT LONG TERM GOAL #3   Title Patient will have a worst pain of 2/10 to indicate significant improvement with pain and spasms and allow for performance of work related activities    Baseline 8/10    Time 6    Period Weeks    Status New    Target Date 12/06/19      PT LONG TERM GOAL #4   Title Patient will be able return to occupational tasks without signifcant difficulty and pain to better return to job related duties    Baseline not currently working secondary to pain    Time 6    Period Weeks    Status New    Target Date 12/06/19                 Plan - 11/09/19 0936    Clinical Impression Statement Pt able to complete entire session as planned with rest breaks provided as needed. Pt maintains high level of focus and motivation. Extensive verbal, visual, and tactile cues are provided for most accurate form possible. Author provides minA intermittently for full ROM when needed. Overall pt continues to make steady progress toward treatment goals.    Personal Factors and Comorbidities Comorbidity 2    Comorbidities Chronic HA    Examination-Activity Limitations Carry;Sit    Examination-Participation Restrictions Contractor    Stability/Clinical Decision Making Evolving/Moderate complexity    Clinical Decision Making Moderate    Rehab Potential Good    PT Frequency 2x / week    PT Duration 6 weeks    PT Treatment/Interventions Joint Manipulations;Spinal Manipulations;Dry  needling;Passive range of motion;Patient/family education;Manual techniques;Therapeutic exercise;Neuromuscular re-education;Canalith Repostioning;Electrical Stimulation;Moist Heat    PT Next Visit Plan Add in pec stretch for improved shoulder/scapular mobility, progress DNF endurance if needed    PT Home Exercise Plan See education section    Consulted and Agree with Plan of Care Patient           Patient will benefit from skilled therapeutic intervention in order to  improve the following deficits and impairments:  Pain, Postural dysfunction, Increased muscle spasms, Increased fascial restricitons, Hypomobility, Decreased strength, Decreased range of motion, Decreased mobility, Decreased coordination, Decreased endurance, Decreased activity tolerance  Visit Diagnosis: Cervicalgia  Cramp and spasm     Problem List Patient Active Problem List   Diagnosis Date Noted  . Nausea 08/21/2019  . Abdominal bloating 08/21/2019  . History of COVID-19 06/24/2019  . URI (upper respiratory infection) 08/26/2018  . Thyroid nodule 02/27/2018  . Stress 01/10/2017  . DUB (dysfunctional uterine bleeding) 07/16/2016  . Low back pain 04/30/2015  . Fatigue 02/08/2015  . Skin lesion of breast 08/06/2014  . Health care maintenance 08/06/2014  . Nephrolithiasis 02/11/2013  . Migraine 02/11/2013  . History of frequent urinary tract infections 02/11/2013  . Syncope 02/11/2013  . IBS (irritable bowel syndrome) 02/11/2013  . Anemia 02/11/2013  . Vitamin D deficiency 02/11/2013   9:51 AM, 11/09/19 Etta Grandchild, PT, DPT Physical Therapist - [place] [phone] (Office)    Michaeline Eckersley C 11/09/2019, 9:46 AM  [place] [facility] [address], [place], [address] Phone: [phone]   Fax:  [phone]  Name: ERYKA DOLINGER MRN: [id number] Date of Birth: February 08, 1981

## 2019-11-13 ENCOUNTER — Ambulatory Visit: Payer: 59

## 2019-11-13 ENCOUNTER — Other Ambulatory Visit: Payer: Self-pay

## 2019-11-13 DIAGNOSIS — R252 Cramp and spasm: Secondary | ICD-10-CM | POA: Diagnosis not present

## 2019-11-13 DIAGNOSIS — M542 Cervicalgia: Secondary | ICD-10-CM

## 2019-11-13 NOTE — Therapy (Signed)
[place] [facility] [address] [place], [address] Phone: [phone]   Fax:  [phone]  Physical Therapy Treatment  Patient Details  Name: Tina Fleming MRN: [id number] Date of Birth: [birth year] Referring Provider (PT): Nicki Reaper MD   Encounter Date: 11/13/2019   PT End of Session - 11/13/19 1128    Visit Number 5    Number of Visits 13    Date for PT Re-Evaluation 12/06/19    PT Start Time 0915    PT Stop Time 0947    PT Time Calculation (min) 32 min    Activity Tolerance No increased pain;Patient tolerated treatment well    Behavior During Therapy [facility] for tasks assessed/performed           Past Medical History:  Diagnosis Date  . Abnormal EKG    no cardiology follow  up was needed  . Anemia    iron suppliments in past-not currently  . Anemia   . Heart murmur    asymtomatic  . History of kidney stones   . IBS (irritable bowel syndrome)   . Migraine   . Nephrolithiasis   . Obesity   . PONV (postoperative nausea and vomiting)    prolonged sedation  . Vitamin D deficiency     Past Surgical History:  Procedure Laterality Date  . APPENDECTOMY  03/21/12   laproscopic appy  . DILATATION & CURETTAGE/HYSTEROSCOPY WITH MYOSURE N/A 03/20/2016   Procedure: [place];  Surgeon: Servando Salina, MD;  Location: [facility];  Service: Gynecology;  Laterality: N/A;  45 min. requested  . KNEE ARTHROSCOPY  04/24/2011   Procedure: ARTHROSCOPY KNEE;  Surgeon: Johnn Hai;  Location: [facility];  Service: Orthopedics;  Laterality: Right;  /Arthroscopy with debridement, right knee  . LAPAROSCOPIC APPENDECTOMY  03/21/2012   Procedure: APPENDECTOMY LAPAROSCOPIC;  Surgeon: Joyice Faster. Cornett, MD;  Location: [facility];  Service: General;  Laterality: N/A;  . LITHOTRIPSY    . URETHRAL STRICTURE DILATATION     as a child    There were no vitals filed for this  visit.   Subjective Assessment - 11/13/19 1122    Subjective Patient states she has been improving overall especially after the dry needling. Patient reports she would like to continue to DN.    Pertinent History Patient reports increased neck pain that began four years prior. Patient states she also has a history of head aches which has been impacting her ability to work and function overall. Patient states increased pain with performing head movements, most specifically rotation and performing cervical flexion. Patient states she has been out of work secondary to increased neck pain and head aches and this is the case until the week of June 14th 2021. Patient states she has been using ice with minimal improvement and managing with pain with pain medications where necessary. Increase in pain 4 months prior without moi. Patient states she had recently receive a occipital nerve block which helped minimally with her symptoms. Patient states she would like to decrease her pain overall so she can return to work.    Limitations Lifting    Diagnostic tests MRI, Xray    Patient Stated Goals Decrease pain    Currently in Pain? No/denies            TREATMENT Therapeutic Exercise Cervical retraction in sitting -- x 15 Cervical retraction with cervical side bending -- x 15 Cervical retraction with cervical rotation --  x 15 Quadruped with performing cervical retraction -- x 15 Quadruped with UE flexion -- x 15  Performed to decrease increased spasms and pain  Manual therapy  STM performed to the suboccipital musculature to decrease increased spasms and pain with patient positioned in prone. While performing manual therapy; TrP DN to the L  suboccipital musculature to decrease pain and spasms with patient positioned in prone .3 mm x 77mm to decrease pain            PT Education - 11/13/19 1127    Education Details form/technique with exercise;    Person(s) Educated Patient    Methods  Explanation;Demonstration    Comprehension Verbalized understanding;Returned demonstration            PT Short Term Goals - 10/26/19 0839      PT SHORT TERM GOAL #1   Title Patient will be independent with HEP to continue benefits of therapy after discharge.    Baseline Dependent with HEP    Time 2    Period Weeks    Status New    Target Date 11/08/19             PT Long Term Goals - 10/26/19 0840      PT LONG TERM GOAL #1   Title Patinet will be independent with HEP to continue benefits of therapy until after discharge.    Baseline Dependent with HEP    Time 6    Period Weeks    Status New    Target Date 12/06/19      PT LONG TERM GOAL #2   Title Patient will have a significant improvement in her FOTO score to indicate improvement with cervical function and stability.    Baseline FOTO: Assess NV    Time 6    Period Weeks    Status New    Target Date 12/06/19      PT LONG TERM GOAL #3   Title Patient will have a worst pain of 2/10 to indicate significant improvement with pain and spasms and allow for performance of work related activities    Baseline 8/10    Time 6    Period Weeks    Status New    Target Date 12/06/19      PT LONG TERM GOAL #4   Title Patient will be able return to occupational tasks without signifcant difficulty and pain to better return to job related duties    Baseline not currently working secondary to pain    Time 6    Period Weeks    Status New    Target Date 12/06/19                 Plan - 11/13/19 1129    Clinical Impression Statement Patient demonstrates decreased pain after dry needling, continues to have limitations during cervical frontal and rotational planes of motion. Patient states no increase in pain at the end of the session. Will continue to progress and patient will benefit from further skilled therapy to return to prior level of function.    Personal Factors and Comorbidities Comorbidity 2    Comorbidities  Chronic HA    Examination-Activity Limitations Carry;Sit    Examination-Participation Restrictions Contractor    Stability/Clinical Decision Making Evolving/Moderate complexity    Rehab Potential Good    PT Frequency 2x / week    PT Duration 6 weeks    PT Treatment/Interventions Joint Manipulations;Spinal Manipulations;Dry needling;Passive range of motion;Patient/family education;Manual techniques;Therapeutic exercise;Neuromuscular re-education;Canalith  Repostioning;Electrical Stimulation;Moist Heat    PT Next Visit Plan Add in pec stretch for improved shoulder/scapular mobility, progress DNF endurance if needed    PT Home Exercise Plan See education section    Consulted and Agree with Plan of Care Patient           Patient will benefit from skilled therapeutic intervention in order to improve the following deficits and impairments:  Pain, Postural dysfunction, Increased muscle spasms, Increased fascial restricitons, Hypomobility, Decreased strength, Decreased range of motion, Decreased mobility, Decreased coordination, Decreased endurance, Decreased activity tolerance  Visit Diagnosis: Cervicalgia  Cramp and spasm     Problem List Patient Active Problem List   Diagnosis Date Noted  . Nausea 08/21/2019  . Abdominal bloating 08/21/2019  . History of COVID-19 06/24/2019  . URI (upper respiratory infection) 08/26/2018  . Thyroid nodule 02/27/2018  . Stress 01/10/2017  . DUB (dysfunctional uterine bleeding) 07/16/2016  . Low back pain 04/30/2015  . Fatigue 02/08/2015  . Skin lesion of breast 08/06/2014  . Health care maintenance 08/06/2014  . Nephrolithiasis 02/11/2013  . Migraine 02/11/2013  . History of frequent urinary tract infections 02/11/2013  . Syncope 02/11/2013  . IBS (irritable bowel syndrome) 02/11/2013  . Anemia 02/11/2013  . Vitamin D deficiency 02/11/2013    Myrene Galas, PT DPT 11/13/2019, 11:37 AM  [place] [facility]  PHYSICAL AND SPORTS MEDICINE [address] [place], [address] Phone: [phone]   Fax:  [phone]  Name: Tina Fleming MRN: [id number] Date of Birth: Nov 05, 1980

## 2019-11-14 MED FILL — BOTOX 200 UNITS VIAL: 200 | 1 days supply | Qty: 1 | Fill #0

## 2019-11-16 ENCOUNTER — Other Ambulatory Visit: Payer: Self-pay

## 2019-11-16 ENCOUNTER — Ambulatory Visit: Payer: 59

## 2019-11-16 DIAGNOSIS — R252 Cramp and spasm: Secondary | ICD-10-CM | POA: Diagnosis not present

## 2019-11-16 DIAGNOSIS — M542 Cervicalgia: Secondary | ICD-10-CM | POA: Diagnosis not present

## 2019-11-16 NOTE — Therapy (Signed)
[place] [facility] [address] [place], [address] Phone: [phone]   Fax:  [phone]  Physical Therapy Treatment  Patient Details  Name: Tina Fleming MRN: [id number] Date of Birth: [birth year] Referring Provider (PT): Lorin Picket MD   Encounter Date: 11/16/2019   PT End of Session - 11/16/19 0930    Visit Number 6    Number of Visits 13    Date for PT Re-Evaluation 12/06/19    PT Start Time 0900    PT Stop Time 0945    PT Time Calculation (min) 45 min    Activity Tolerance No increased pain;Patient tolerated treatment well    Behavior During Therapy [facility] for tasks assessed/performed           Past Medical History:  Diagnosis Date  . Abnormal EKG    no cardiology follow  up was needed  . Anemia    iron suppliments in past-not currently  . Anemia   . Heart murmur    asymtomatic  . History of kidney stones   . IBS (irritable bowel syndrome)   . Migraine   . Nephrolithiasis   . Obesity   . PONV (postoperative nausea and vomiting)    prolonged sedation  . Vitamin D deficiency     Past Surgical History:  Procedure Laterality Date  . APPENDECTOMY  03/21/12   laproscopic appy  . DILATATION & CURETTAGE/HYSTEROSCOPY WITH MYOSURE N/A 03/20/2016   Procedure: DILATATION & CURETTAGE/HYSTEROSCOPY WITH MYOSURE;  Surgeon: Maxie Better, MD;  Location: [facility];  Service: Gynecology;  Laterality: N/A;  45 min. requested  . KNEE ARTHROSCOPY  04/24/2011   Procedure: ARTHROSCOPY KNEE;  Surgeon: Javier Docker;  Location: [facility];  Service: Orthopedics;  Laterality: Right;  /Arthroscopy with debridement, right knee  . LAPAROSCOPIC APPENDECTOMY  03/21/2012   Procedure: APPENDECTOMY LAPAROSCOPIC;  Surgeon: Clovis Pu. Cornett, MD;  Location: [facility];  Service: General;  Laterality: N/A;  . LITHOTRIPSY    . URETHRAL STRICTURE DILATATION     as a child    There were no vitals filed for this  visit.   Subjective Assessment - 11/16/19 0908    Subjective Patient states she felt increase in pain after the previous session. Patient states her pain subsided after two days but continues to feel sore today.    Pertinent History Patient reports increased neck pain that began four years prior. Patient states she also has a history of head aches which has been impacting her ability to work and function overall. Patient states increased pain with performing head movements, most specifically rotation and performing cervical flexion. Patient states she has been out of work secondary to increased neck pain and head aches and this is the case until the week of June 14th 2021. Patient states she has been using ice with minimal improvement and managing with pain with pain medications where necessary. Increase in pain 4 months prior without moi. Patient states she had recently receive a occipital nerve block which helped minimally with her symptoms. Patient states she would like to decrease her pain overall so she can return to work.    Limitations Lifting    Diagnostic tests MRI, Xray    Patient Stated Goals Decrease pain    Currently in Pain? Yes    Pain Score 2     Pain Location Neck    Pain Orientation Upper;Posterior    Pain Descriptors / Indicators Aching    Pain  Type Chronic pain    Pain Onset More than a month ago    Pain Frequency Intermittent             TREATMENT Therapeutic Exercise Cervical retraction in sitting -- x 10 Cervical retraction with cervical side bending -- x 10 Cervical retraction with cervical rotation -- x 10 Cervical retraction with scapular retraction - x 10  Cervical extension with towel with 2 sec hold - x 10  Cervical side bending with use towel - x 10  Quadruped with performing cervical extension -- x 10 Cervical retraction with physioball - x10 in quadruped Cervical extension with physioball - x10 in quadruped  Performed to decrease increased spasms and  pain  Manual therapy  STM performed to the suboccipital musculature to decrease increased spasms and pain with patient positioned in prone. While performing manual therapy; TrP DN to the L  suboccipital musculature to decrease pain and spasms with patient positioned in prone .3 mm x 91mm to decrease pain     PT Education - 11/16/19 0929    Education Details form/technique with exercise    Person(s) Educated Patient    Methods Explanation;Demonstration    Comprehension Verbalized understanding;Returned demonstration            PT Short Term Goals - 10/26/19 0839      PT SHORT TERM GOAL #1   Title Patient will be independent with HEP to continue benefits of therapy after discharge.    Baseline Dependent with HEP    Time 2    Period Weeks    Status New    Target Date 11/08/19             PT Long Term Goals - 10/26/19 0840      PT LONG TERM GOAL #1   Title Patinet will be independent with HEP to continue benefits of therapy until after discharge.    Baseline Dependent with HEP    Time 6    Period Weeks    Status New    Target Date 12/06/19      PT LONG TERM GOAL #2   Title Patient will have a significant improvement in her FOTO score to indicate improvement with cervical function and stability.    Baseline FOTO: Assess NV    Time 6    Period Weeks    Status New    Target Date 12/06/19      PT LONG TERM GOAL #3   Title Patient will have a worst pain of 2/10 to indicate significant improvement with pain and spasms and allow for performance of work related activities    Baseline 8/10    Time 6    Period Weeks    Status New    Target Date 12/06/19      PT LONG TERM GOAL #4   Title Patient will be able return to occupational tasks without signifcant difficulty and pain to better return to job related duties    Baseline not currently working secondary to pain    Time 6    Period Weeks    Status New    Target Date 12/06/19                 Plan - 11/16/19  1254    Clinical Impression Statement Patient demonstrates increased soreness while performing dry needling, but no increase in pain at the end of the session. Patient able to perform exercises to a greater capacity with less overall pain overall pain  compared to previous session indicating functional carryover between sessions. Patient will benefit from further skilled therapy to return to prior level of function.    Personal Factors and Comorbidities Comorbidity 2    Comorbidities Chronic HA    Examination-Activity Limitations Carry;Sit    Examination-Participation Restrictions Tour manager    Stability/Clinical Decision Making Evolving/Moderate complexity    Rehab Potential Good    PT Frequency 2x / week    PT Duration 6 weeks    PT Treatment/Interventions Joint Manipulations;Spinal Manipulations;Dry needling;Passive range of motion;Patient/family education;Manual techniques;Therapeutic exercise;Neuromuscular re-education;Canalith Repostioning;Electrical Stimulation;Moist Heat    PT Next Visit Plan Add in pec stretch for improved shoulder/scapular mobility, progress DNF endurance if needed    PT Home Exercise Plan See education section    Consulted and Agree with Plan of Care Patient           Patient will benefit from skilled therapeutic intervention in order to improve the following deficits and impairments:  Pain, Postural dysfunction, Increased muscle spasms, Increased fascial restricitons, Hypomobility, Decreased strength, Decreased range of motion, Decreased mobility, Decreased coordination, Decreased endurance, Decreased activity tolerance  Visit Diagnosis: Cervicalgia  Cramp and spasm     Problem List Patient Active Problem List   Diagnosis Date Noted  . Nausea 08/21/2019  . Abdominal bloating 08/21/2019  . History of COVID-19 06/24/2019  . URI (upper respiratory infection) 08/26/2018  . Thyroid nodule 02/27/2018  . Stress 01/10/2017  . DUB (dysfunctional uterine  bleeding) 07/16/2016  . Low back pain 04/30/2015  . Fatigue 02/08/2015  . Skin lesion of breast 08/06/2014  . Health care maintenance 08/06/2014  . Nephrolithiasis 02/11/2013  . Migraine 02/11/2013  . History of frequent urinary tract infections 02/11/2013  . Syncope 02/11/2013  . IBS (irritable bowel syndrome) 02/11/2013  . Anemia 02/11/2013  . Vitamin D deficiency 02/11/2013    Blythe Stanford, PT DPT 11/16/2019, 1:10 PM  [place] [facility] [address] [place], [address] Phone: [phone]   Fax:  [phone]  Name: Tina Fleming MRN: [id number] Date of Birth: April 27, 1981

## 2019-11-21 ENCOUNTER — Other Ambulatory Visit: Payer: Self-pay

## 2019-11-21 ENCOUNTER — Ambulatory Visit: Payer: 59

## 2019-11-21 DIAGNOSIS — R252 Cramp and spasm: Secondary | ICD-10-CM | POA: Diagnosis not present

## 2019-11-21 DIAGNOSIS — M542 Cervicalgia: Secondary | ICD-10-CM | POA: Diagnosis not present

## 2019-11-21 NOTE — Therapy (Signed)
[facility] [address], [place], [address] Phone: [phone]   Fax:  [phone]  Physical Therapy Treatment  Patient Details  Name: Tina Fleming MRN: [id number] Date of Birth: Jun 18, 1980 Referring Provider (PT): Lorin Picket MD   Encounter Date: 11/21/2019   PT End of Session - 11/21/19 0939    Visit Number 7    Number of Visits 13    Date for PT Re-Evaluation 12/06/19    PT Start Time 0900    PT Stop Time 0945    PT Time Calculation (min) 45 min    Activity Tolerance No increased pain;Patient tolerated treatment well    Behavior During Therapy [facility] for tasks assessed/performed           Past Medical History:  Diagnosis Date   Abnormal EKG    no cardiology follow  up was needed   Anemia    iron suppliments in past-not currently   Anemia    Heart murmur    asymtomatic   History of kidney stones    IBS (irritable bowel syndrome)    Migraine    Nephrolithiasis    Obesity    PONV (postoperative nausea and vomiting)    prolonged sedation   Vitamin D deficiency     Past Surgical History:  Procedure Laterality Date   APPENDECTOMY  03/21/12   laproscopic appy   DILATATION & CURETTAGE/HYSTEROSCOPY WITH MYOSURE N/A 03/20/2016   Procedure: DILATATION & CURETTAGE/HYSTEROSCOPY WITH MYOSURE;  Surgeon: Maxie Better, MD;  Location: [facility];  Service: Gynecology;  Laterality: N/A;  45 min. requested   KNEE ARTHROSCOPY  04/24/2011   Procedure: ARTHROSCOPY KNEE;  Surgeon: Javier Docker;  Location: [facility];  Service: Orthopedics;  Laterality: Right;  /Arthroscopy with debridement, right knee   LAPAROSCOPIC APPENDECTOMY  03/21/2012   Procedure: APPENDECTOMY LAPAROSCOPIC;  Surgeon: Clovis Pu. Cornett, MD;  Location: [facility];  Service: General;  Laterality: N/A;   LITHOTRIPSY     URETHRAL STRICTURE DILATATION     as a child    There were no vitals filed for this  visit.   Subjective Assessment - 11/21/19 0925    Subjective Patient states alleviation of pain ater performing dry needling after the previous session. Patient reports onset of pain after 2 days.    Pertinent History Patient reports increased neck pain that began four years prior. Patient states she also has a history of head aches which has been impacting her ability to work and function overall. Patient states increased pain with performing head movements, most specifically rotation and performing cervical flexion. Patient states she has been out of work secondary to increased neck pain and head aches and this is the case until the week of June 14th 2021. Patient states she has been using ice with minimal improvement and managing with pain with pain medications where necessary. Increase in pain 4 months prior without moi. Patient states she had recently receive a occipital nerve block which helped minimally with her symptoms. Patient states she would like to decrease her pain overall so she can return to work.    Limitations Lifting    Diagnostic tests MRI, Xray    Patient Stated Goals Decrease pain    Currently in Pain? No/denies    Pain Onset More than a month ago           INTERVENTIONS  Therapeutic Exercise Is, Ts, Ys on Med Ball 1x15 (cued to hold cervical  retraction while performing exercises) Cervical Isometric (extension, Left and Right Side bend) 2x30sec for each  GTB High Row 2x15    Shoulder Elevation (shrugs) #8 2x15  Cervical retraction with cervical rotation -- x 10   Manual therapy  STM performed to the suboccipital musculature to decrease increased spasms and pain with patient positioned in prone. While performing manual therapy; TrP DN to the L  suboccipital musculature to decrease pain and spasms with patient positioned in prone .3 mm x 51mm to decrease pain    PT Education - 11/21/19 0939    Education Details form/technique with exercise    Person(s) Educated  Patient    Methods Explanation;Demonstration    Comprehension Verbalized understanding;Returned demonstration            PT Short Term Goals - 10/26/19 0839      PT SHORT TERM GOAL #1   Title Patient will be independent with HEP to continue benefits of therapy after discharge.    Baseline Dependent with HEP    Time 2    Period Weeks    Status New    Target Date 11/08/19             PT Long Term Goals - 10/26/19 0840      PT LONG TERM GOAL #1   Title Patinet will be independent with HEP to continue benefits of therapy until after discharge.    Baseline Dependent with HEP    Time 6    Period Weeks    Status New    Target Date 12/06/19      PT LONG TERM GOAL #2   Title Patient will have a significant improvement in her FOTO score to indicate improvement with cervical function and stability.    Baseline FOTO: Assess NV    Time 6    Period Weeks    Status New    Target Date 12/06/19      PT LONG TERM GOAL #3   Title Patient will have a worst pain of 2/10 to indicate significant improvement with pain and spasms and allow for performance of work related activities    Baseline 8/10    Time 6    Period Weeks    Status New    Target Date 12/06/19      PT LONG TERM GOAL #4   Title Patient will be able return to occupational tasks without signifcant difficulty and pain to better return to job related duties    Baseline not currently working secondary to pain    Time 6    Period Weeks    Status New    Target Date 12/06/19                 Plan - 11/21/19 0940    Clinical Impression Statement Patient demonstrates improvement with exercise performance requiring less cueing to perform. Patient able to maintain    Personal Factors and Comorbidities Comorbidity 2    Comorbidities Chronic HA    Examination-Activity Limitations Carry;Sit    Examination-Participation Restrictions Contract[employer]    Stability/Clinical Decision Making Evolving/Moderate complexity     Rehab Potential Good    PT Frequency 2x / week    PT Duration 6 weeks    PT Treatment/Interventions Joint Manipulations;Spinal Manipulations;Dry needling;Passive range of motion;Patient/family education;Manual techniques;Therapeutic exercise;Neuromuscular re-education;Canalith Repostioning;Electrical Stimulation;Moist Heat    PT Next Visit Plan Add in pec stretch for improved shoulder/scapular mobility, progress DNF endurance if needed    PT Home Exercise Plan  See education section    Consulted and Agree with Plan of Care Patient           Patient will benefit from skilled therapeutic intervention in order to improve the following deficits and impairments:  Pain, Postural dysfunction, Increased muscle spasms, Increased fascial restricitons, Hypomobility, Decreased strength, Decreased range of motion, Decreased mobility, Decreased coordination, Decreased endurance, Decreased activity tolerance  Visit Diagnosis: No diagnosis found.     Problem List Patient Active Problem List   Diagnosis Date Noted   Nausea 08/21/2019   Abdominal bloating 08/21/2019   History of COVID-19 06/24/2019   URI (upper respiratory infection) 08/26/2018   Thyroid nodule 02/27/2018   Stress 01/10/2017   DUB (dysfunctional uterine bleeding) 07/16/2016   Low back pain 04/30/2015   Fatigue 02/08/2015   Skin lesion of breast 08/06/2014   Health care maintenance 08/06/2014   Nephrolithiasis 02/11/2013   Migraine 02/11/2013   History of frequent urinary tract infections 02/11/2013   Syncope 02/11/2013   IBS (irritable bowel syndrome) 02/11/2013   Anemia 02/11/2013   Vitamin D deficiency 02/11/2013    Luanna Salk 11/21/2019, 9:52 AM  [facility] [address] [place], [address] Phone: [phone]   Fax:  [phone]  Name: Tina Fleming MRN: [id number] Date of Birth: July 18, 1980

## 2019-11-23 ENCOUNTER — Ambulatory Visit: Payer: 59 | Attending: Neurology

## 2019-11-23 ENCOUNTER — Other Ambulatory Visit: Payer: Self-pay

## 2019-11-23 DIAGNOSIS — M542 Cervicalgia: Secondary | ICD-10-CM | POA: Diagnosis not present

## 2019-11-23 DIAGNOSIS — R252 Cramp and spasm: Secondary | ICD-10-CM | POA: Insufficient documentation

## 2019-11-23 NOTE — Therapy (Signed)
[place] [facility] [address] [place], [address] Phone: [phone]   Fax:  [phone]  Physical Therapy Treatment  Patient Details  Name: Tina Fleming MRN: [id number] Date of Birth: 01-Jan-1981 Referring Provider (PT): Lorin Picket MD   Encounter Date: 11/23/2019   PT End of Session - 11/23/19 0901    Visit Number 8    Number of Visits 13    Date for PT Re-Evaluation 12/06/19    PT Start Time 0900    PT Stop Time 0945    PT Time Calculation (min) 45 min    Activity Tolerance No increased pain;Patient tolerated treatment well    Behavior During Therapy [facility] for tasks assessed/performed           Past Medical History:  Diagnosis Date  . Abnormal EKG    no cardiology follow  up was needed  . Anemia    iron suppliments in past-not currently  . Anemia   . Heart murmur    asymtomatic  . History of kidney stones   . IBS (irritable bowel syndrome)   . Migraine   . Nephrolithiasis   . Obesity   . PONV (postoperative nausea and vomiting)    prolonged sedation  . Vitamin D deficiency     Past Surgical History:  Procedure Laterality Date  . APPENDECTOMY  03/21/12   laproscopic appy  . DILATATION & CURETTAGE/HYSTEROSCOPY WITH MYOSURE N/A 03/20/2016   Procedure: DILATATION & CURETTAGE/HYSTEROSCOPY WITH MYOSURE;  Surgeon: Maxie Better, MD;  Location: [facility];  Service: Gynecology;  Laterality: N/A;  45 min. requested  . KNEE ARTHROSCOPY  04/24/2011   Procedure: ARTHROSCOPY KNEE;  Surgeon: Javier Docker;  Location: [facility];  Service: Orthopedics;  Laterality: Right;  /Arthroscopy with debridement, right knee  . LAPAROSCOPIC APPENDECTOMY  03/21/2012   Procedure: APPENDECTOMY LAPAROSCOPIC;  Surgeon: Clovis Pu. Cornett, MD;  Location: [facility];  Service: General;  Laterality: N/A;  . LITHOTRIPSY    . URETHRAL STRICTURE DILATATION     as a child    There were no vitals filed for this  visit.   Subjective Assessment - 11/23/19 0900    Subjective Patient reports that she did not have an onset of pain after last session and has been feeling good since.    Pertinent History Patient reports increased neck pain that began four years prior. Patient states she also has a history of head aches which has been impacting her ability to work and function overall. Patient states increased pain with performing head movements, most specifically rotation and performing cervical flexion. Patient states she has been out of work secondary to increased neck pain and head aches and this is the case until the week of June 14th 2021. Patient states she has been using ice with minimal improvement and managing with pain with pain medications where necessary. Increase in pain 4 months prior without moi. Patient states she had recently receive a occipital nerve block which helped minimally with her symptoms. Patient states she would like to decrease her pain overall so she can return to work.    Limitations Lifting    Diagnostic tests MRI, Xray    Patient Stated Goals Decrease pain    Pain Onset More than a month ago          INTERVENTIONS  Therapeutic Exercise I's, T's, Y's on Med Ball 1x15 (cued to hold cervical retraction while performing exercises) Cervical Isometric (extension, Left and  Right Side bend) 2x30sec for each  Shoulder Extension with GTB 2x15    Shoulder Elevation (shrugs) #8 2x15  Cervical retraction with cervical rotation -- x 10 Shoulder Abduction #4 2x15  Standing Rowing on OMEGA #15 2x15  Standing GHJ ER with GTB 2x15   Performed exercises to decrease increased pain and spasms   Manual therapy  STM performed to the suboccipital musculature to decrease increased spasms and pain with patient positioned in prone. While performing manual therapy; TrP DN to the L suboccipital musculature to decrease pain and spasms with patient positioned in prone .3 mm x 61mm to decrease  pain    PT Education - 11/23/19 0900    Education Details form/technique    Person(s) Educated Patient    Methods Explanation;Demonstration    Comprehension Verbalized understanding;Returned demonstration            PT Short Term Goals - 10/26/19 0839      PT SHORT TERM GOAL #1   Title Patient will be independent with HEP to continue benefits of therapy after discharge.    Baseline Dependent with HEP    Time 2    Period Weeks    Status New    Target Date 11/08/19             PT Long Term Goals - 10/26/19 0840      PT LONG TERM GOAL #1   Title Patinet will be independent with HEP to continue benefits of therapy until after discharge.    Baseline Dependent with HEP    Time 6    Period Weeks    Status New    Target Date 12/06/19      PT LONG TERM GOAL #2   Title Patient will have a significant improvement in her FOTO score to indicate improvement with cervical function and stability.    Baseline FOTO: Assess NV    Time 6    Period Weeks    Status New    Target Date 12/06/19      PT LONG TERM GOAL #3   Title Patient will have a worst pain of 2/10 to indicate significant improvement with pain and spasms and allow for performance of work related activities    Baseline 8/10    Time 6    Period Weeks    Status New    Target Date 12/06/19      PT LONG TERM GOAL #4   Title Patient will be able return to occupational tasks without signifcant difficulty and pain to better return to job related duties    Baseline not currently working secondary to pain    Time 6    Period Weeks    Status New    Target Date 12/06/19                 Plan - 11/23/19 0902    Clinical Impression Statement Added in scapular and shoulder strengthening exercises at today's session.  Patient tolerated exercises without symptoms and required minimal cueing to perform exercises correctly. Will continue to progress patients exercises to improve strength and endurance.  Patient will  continue to benefit from skilled therapy to return to PLOF.   Personal Factors and Comorbidities Comorbidity 2    Comorbidities Chronic HA    Examination-Activity Limitations Carry;Sit    Examination-Participation Restrictions Yard Work;Other    Stability/Clinical Decision Making Evolving/Moderate complexity    Clinical Decision Making Moderate    Rehab Potential Good    PT Frequency  2x / week    PT Duration 6 weeks    PT Treatment/Interventions Joint Manipulations;Spinal Manipulations;Dry needling;Passive range of motion;Patient/family education;Manual techniques;Therapeutic exercise;Neuromuscular re-education;Canalith Repostioning;Electrical Stimulation;Moist Heat    PT Next Visit Plan Add in pec stretch for improved shoulder/scapular mobility, progress DNF endurance if needed    PT Home Exercise Plan See education section    Consulted and Agree with Plan of Care Patient           Patient will benefit from skilled therapeutic intervention in order to improve the following deficits and impairments:  Pain, Postural dysfunction, Increased muscle spasms, Increased fascial restricitons, Hypomobility, Decreased strength, Decreased range of motion, Decreased mobility, Decreased coordination, Decreased endurance, Decreased activity tolerance  Visit Diagnosis: Cervicalgia  Cramp and spasm     Problem List Patient Active Problem List   Diagnosis Date Noted  . Nausea 08/21/2019  . Abdominal bloating 08/21/2019  . History of COVID-19 06/24/2019  . URI (upper respiratory infection) 08/26/2018  . Thyroid nodule 02/27/2018  . Stress 01/10/2017  . DUB (dysfunctional uterine bleeding) 07/16/2016  . Low back pain 04/30/2015  . Fatigue 02/08/2015  . Skin lesion of breast 08/06/2014  . Health care maintenance 08/06/2014  . Nephrolithiasis 02/11/2013  . Migraine 02/11/2013  . History of frequent urinary tract infections 02/11/2013  . Syncope 02/11/2013  . IBS (irritable bowel syndrome)  02/11/2013  . Anemia 02/11/2013  . Vitamin D deficiency 02/11/2013   9:30 AM, 11/23/19 Luanna Salk, SPT Student Physical Therapist [place]  [phone]  Luanna Salk 11/23/2019, 9:16 AM  [place] [facility] SPORTS MEDIC[facility] [address] [place], [address] Phone: [phone]   Fax:  [phone]  Name: Tina Fleming MRN: [id number] Date of Birth: 25-Dec-1980

## 2019-11-27 DIAGNOSIS — G4733 Obstructive sleep apnea (adult) (pediatric): Secondary | ICD-10-CM | POA: Diagnosis not present

## 2019-11-29 ENCOUNTER — Other Ambulatory Visit: Payer: Self-pay

## 2019-11-29 ENCOUNTER — Ambulatory Visit: Payer: 59

## 2019-11-29 DIAGNOSIS — R252 Cramp and spasm: Secondary | ICD-10-CM | POA: Diagnosis not present

## 2019-11-29 DIAGNOSIS — M542 Cervicalgia: Secondary | ICD-10-CM | POA: Diagnosis not present

## 2019-11-29 NOTE — Therapy (Signed)
[place] [facility] [address], [place], [address] Phone: [phone]   Fax:  [phone]  Physical Therapy Treatment  Patient Details  Name: Tina Fleming MRN: [id number] Date of Birth: 1981/05/11 Referring Provider (PT): Lorin Picket MD   Encounter Date: 11/29/2019   PT End of Session - 11/29/19 1120    Visit Number 9    Number of Visits 13    Date for PT Re-Evaluation 12/06/19    PT Start Time 1045    PT Stop Time 1130    PT Time Calculation (min) 45 min    Activity Tolerance No increased pain;Patient tolerated treatment well    Behavior During Therapy [facility] for tasks assessed/performed           Past Medical History:  Diagnosis Date  . Abnormal EKG    no cardiology follow  up was needed  . Anemia    iron suppliments in past-not currently  . Anemia   . Heart murmur    asymtomatic  . History of kidney stones   . IBS (irritable bowel syndrome)   . Migraine   . Nephrolithiasis   . Obesity   . PONV (postoperative nausea and vomiting)    prolonged sedation  . Vitamin D deficiency     Past Surgical History:  Procedure Laterality Date  . APPENDECTOMY  03/21/12   laproscopic appy  . DILATATION & CURETTAGE/HYSTEROSCOPY WITH MYOSURE N/A 03/20/2016   Procedure: DILATATION & CURETTAGE/HYSTEROSCOPY WITH MYOSURE;  Surgeon: Maxie Better, MD;  Location: [facility];  Service: Gynecology;  Laterality: N/A;  45 min. requested  . KNEE ARTHROSCOPY  04/24/2011   Procedure: ARTHROSCOPY KNEE;  Surgeon: Javier Docker;  Location: [facility];  Service: Orthopedics;  Laterality: Right;  /Arthroscopy with debridement, right knee  . LAPAROSCOPIC APPENDECTOMY  03/21/2012   Procedure: APPENDECTOMY LAPAROSCOPIC;  Surgeon: Clovis Pu. Cornett, MD;  Location: [facility];  Service: General;  Laterality: N/A;  . LITHOTRIPSY    . URETHRAL STRICTURE DILATATION     as a child    There were no vitals filed for this  visit.   Subjective Assessment - 11/29/19 1117    Subjective Patient states overall increase in pain on Sunday after working, but reports the pain decreased overall.    Pertinent History Patient reports increased neck pain that began four years prior. Patient states she also has a history of head aches which has been impacting her ability to work and function overall. Patient states increased pain with performing head movements, most specifically rotation and performing cervical flexion. Patient states she has been out of work secondary to increased neck pain and head aches and this is the case until the week of June 14th 2021. Patient states she has been using ice with minimal improvement and managing with pain with pain medications where necessary. Increase in pain 4 months prior without moi. Patient states she had recently receive a occipital nerve block which helped minimally with her symptoms. Patient states she would like to decrease her pain overall so she can return to work.    Limitations Lifting    Diagnostic tests MRI, Xray    Patient Stated Goals Decrease pain    Currently in Pain? No/denies    Pain Onset More than a month ago              INTERVENTIONS   Therapeutic Exercise I's, T's, Y's on Med Ball  (cued to hold cervical retraction  while performing exercises) -- x63for each 2# db (x10 with Y's) Seated cervical retraction with turns - x 15 Cervical Isometric (extension, Left and Right Side bend) - x3 30sec  Shoulder Abduction #5 2x15  Shoulder Elevation (shrugs) #10 2x15    Performed exercises to decrease increased pain and spasms     Manual therapy  STM performed to the suboccipital musculature to decrease increased spasms and pain with patient positioned in prone. While performing manual therapy; TrP DN to the L  suboccipital musculature to decrease pain and spasms with patient positioned in prone .3 mm x 33mm to decrease pain    PT Education - 11/29/19 1119     Education Details form/technique with exercise    Person(s) Educated Patient    Methods Explanation;Demonstration    Comprehension Verbalized understanding;Returned demonstration            PT Short Term Goals - 10/26/19 0839      PT SHORT TERM GOAL #1   Title Patient will be independent with HEP to continue benefits of therapy after discharge.    Baseline Dependent with HEP    Time 2    Period Weeks    Status New    Target Date 11/08/19             PT Long Term Goals - 10/26/19 0840      PT LONG TERM GOAL #1   Title Patinet will be independent with HEP to continue benefits of therapy until after discharge.    Baseline Dependent with HEP    Time 6    Period Weeks    Status New    Target Date 12/06/19      PT LONG TERM GOAL #2   Title Patient will have a significant improvement in her FOTO score to indicate improvement with cervical function and stability.    Baseline FOTO: Assess NV    Time 6    Period Weeks    Status New    Target Date 12/06/19      PT LONG TERM GOAL #3   Title Patient will have a worst pain of 2/10 to indicate significant improvement with pain and spasms and allow for performance of work related activities    Baseline 8/10    Time 6    Period Weeks    Status New    Target Date 12/06/19      PT LONG TERM GOAL #4   Title Patient will be able return to occupational tasks without signifcant difficulty and pain to better return to job related duties    Baseline not currently working secondary to pain    Time 6    Period Weeks    Status New    Target Date 12/06/19                 Plan - 11/29/19 1123    Clinical Impression Statement Patient demonstrates improvement with ability to perform exercises without increase in pain but able to perform with greater level of resistance indicating good carryover over the past session. Patient with less overall pain indicating further improvement. Although patient is improving, she continues to have  difficulty with performing end range motion with performing cervical retraction contributing to cervical limitations. Patient will benefit from further skilled therapy to return to prior level of function.    Personal Factors and Comorbidities Comorbidity 2    Comorbidities Chronic HA    Examination-Activity Limitations Carry;Sit    Examination-Participation Restrictions [employer]  Stability/Clinical Decision Making Evolving/Moderate complexity    Rehab Potential Good    PT Frequency 2x / week    PT Duration 6 weeks    PT Treatment/Interventions Joint Manipulations;Spinal Manipulations;Dry needling;Passive range of motion;Patient/family education;Manual techniques;Therapeutic exercise;Neuromuscular re-education;Canalith Repostioning;Electrical Stimulation;Moist Heat    PT Next Visit Plan Add in pec stretch for improved shoulder/scapular mobility, progress DNF endurance if needed    PT Home Exercise Plan See education section    Consulted and Agree with Plan of Care Patient           Patient will benefit from skilled therapeutic intervention in order to improve the following deficits and impairments:  Pain, Postural dysfunction, Increased muscle spasms, Increased fascial restricitons, Hypomobility, Decreased strength, Decreased range of motion, Decreased mobility, Decreased coordination, Decreased endurance, Decreased activity tolerance  Visit Diagnosis: Cervicalgia  Cramp and spasm     Problem List Patient Active Problem List   Diagnosis Date Noted  . Nausea 08/21/2019  . Abdominal bloating 08/21/2019  . History of COVID-19 06/24/2019  . URI (upper respiratory infection) 08/26/2018  . Thyroid nodule 02/27/2018  . Stress 01/10/2017  . DUB (dysfunctional uterine bleeding) 07/16/2016  . Low back pain 04/30/2015  . Fatigue 02/08/2015  . Skin lesion of breast 08/06/2014  . Health care maintenance 08/06/2014  . Nephrolithiasis 02/11/2013  . Migraine 02/11/2013  . History  of frequent urinary tract infections 02/11/2013  . Syncope 02/11/2013  . IBS (irritable bowel syndrome) 02/11/2013  . Anemia 02/11/2013  . Vitamin D deficiency 02/11/2013    Myrene Galas, PT DPT 11/29/2019, 11:36 AM  [place] [facility] PH[facility] SPORTS MEDICINE [address], [place], [address] Phone: [phone]   Fax:  [phone]  Name: Tina Fleming MRN: [id number] Date of Birth: 12-Sep-1980

## 2019-12-04 ENCOUNTER — Ambulatory Visit: Payer: 59

## 2019-12-04 ENCOUNTER — Other Ambulatory Visit: Payer: Self-pay

## 2019-12-04 DIAGNOSIS — M542 Cervicalgia: Secondary | ICD-10-CM | POA: Diagnosis not present

## 2019-12-04 DIAGNOSIS — R252 Cramp and spasm: Secondary | ICD-10-CM | POA: Diagnosis not present

## 2019-12-05 NOTE — Therapy (Signed)
[facility] PHYSICAL AND SPORTS MEDICINE [address], [place], [address] Phone: [phone]   Fax:  [phone]  Physical Therapy Treatment  Patient Details  Name: Tina Fleming MRN: [id number] Date of Birth: 09/19/1980 Referring Provider (PT): Lorin Picket MD   Encounter Date: 12/04/2019   PT End of Session - 12/05/19 0842    Visit Number 10    Number of Visits 13    Date for PT Re-Evaluation 12/06/19    PT Start Time 1815    PT Stop Time 1845    PT Time Calculation (min) 30 min    Activity Tolerance No increased pain;Patient tolerated treatment well    Behavior During Therapy [facility] for tasks assessed/performed           Past Medical History:  Diagnosis Date  . Abnormal EKG    no cardiology follow  up was needed  . Anemia    iron suppliments in past-not currently  . Anemia   . Heart murmur    asymtomatic  . History of kidney stones   . IBS (irritable bowel syndrome)   . Migraine   . Nephrolithiasis   . Obesity   . PONV (postoperative nausea and vomiting)    prolonged sedation  . Vitamin D deficiency     Past Surgical History:  Procedure Laterality Date  . APPENDECTOMY  03/21/12   laproscopic appy  . DILATATION & CURETTAGE/HYSTEROSCOPY WITH MYOSURE N/A 03/20/2016   Procedure: DILATATION & CURETTAGE/HYSTEROSCOPY WITH MYOSURE;  Surgeon: Maxie Better, MD;  Location: [facility];  Service: Gynecology;  Laterality: N/A;  45 min. requested  . KNEE ARTHROSCOPY  04/24/2011   Procedure: ARTHROSCOPY KNEE;  Surgeon: Javier Docker;  Location: [facility];  Service: Orthopedics;  Laterality: Right;  /Arthroscopy with debridement, right knee  . LAPAROSCOPIC APPENDECTOMY  03/21/2012   Procedure: APPENDECTOMY LAPAROSCOPIC;  Surgeon: Clovis Pu. Cornett, MD;  Location: [facility];  Service: General;  Laterality: N/A;  . LITHOTRIPSY    . URETHRAL STRICTURE DILATATION     as a child    There were no vitals filed for this  visit.   Subjective Assessment - 12/04/19 1832    Subjective Patient reports increased pain during today's visit. States she has every other day of increased pain.    Pertinent History Patient reports increased neck pain that began four years prior. Patient states she also has a history of head aches which has been impacting her ability to work and function overall. Patient states increased pain with performing head movements, most specifically rotation and performing cervical flexion. Patient states she has been out of work secondary to increased neck pain and head aches and this is the case until the week of June 14th 2021. Patient states she has been using ice with minimal improvement and managing with pain with pain medications where necessary. Increase in pain 4 months prior without moi. Patient states she had recently receive a occipital nerve block which helped minimally with her symptoms. Patient states she would like to decrease her pain overall so she can return to work.    Limitations Lifting    Diagnostic tests MRI, Xray    Patient Stated Goals Decrease pain    Currently in Pain? Yes    Pain Score 6     Pain Location Neck    Pain Orientation Upper    Pain Descriptors / Indicators Aching    Pain Type Chronic pain    Pain Onset More  than a month ago    Pain Frequency Intermittent             TREATMENT  Therapeutic Exercise I's, T's, Y's on Med Ball  (cued to hold cervical retraction while performing exercises) - x10 for each 2# db Seated cervical retraction with turns - x 15 Cervical Isometric (extension, Left and Right Side bend) - x3 30sec  Wall angels in standing - x 15 Resisted ER with YTB into shoulder flexion - x 15  Performed exercises to address cervical strength   Manual therapy  STM performed to the suboccipital musculature to decrease increased spasms and pain with patient positioned in prone. While performing manual therapy; TrP DN to the L suboccipital  musculature to decrease pain and spasms with patient positioned in prone .3 mm x 43mm to decrease pain     PT Education - 12/05/19 0841    Education Details form/technique with exercise; retraction with head turns    Person(s) Educated Patient    Methods Explanation;Demonstration    Comprehension Verbalized understanding;Returned demonstration            PT Short Term Goals - 10/26/19 0839      PT SHORT TERM GOAL #1   Title Patient will be independent with HEP to continue benefits of therapy after discharge.    Baseline Dependent with HEP    Time 2    Period Weeks    Status New    Target Date 11/08/19             PT Long Term Goals - 10/26/19 0840      PT LONG TERM GOAL #1   Title Patinet will be independent with HEP to continue benefits of therapy until after discharge.    Baseline Dependent with HEP    Time 6    Period Weeks    Status New    Target Date 12/06/19      PT LONG TERM GOAL #2   Title Patient will have a significant improvement in her FOTO score to indicate improvement with cervical function and stability.    Baseline FOTO: Assess NV    Time 6    Period Weeks    Status New    Target Date 12/06/19      PT LONG TERM GOAL #3   Title Patient will have a worst pain of 2/10 to indicate significant improvement with pain and spasms and allow for performance of work related activities    Baseline 8/10    Time 6    Period Weeks    Status New    Target Date 12/06/19      PT LONG TERM GOAL #4   Title Patient will be able return to occupational tasks without signifcant difficulty and pain to better return to job related duties    Baseline not currently working secondary to pain    Time 6    Period Weeks    Status New    Target Date 12/06/19                 Plan - 12/05/19 0844    Clinical Impression Statement Patient with increased pain along the L occipital muscular, which decreased after dry needling. Patient able to tolerate progression of  exercise, however continues to have increase in pain with load to the affected musculature. Patient will benefit from further skilled therapy focused on improving limitations to return to prior level of function.    Personal Factors and Comorbidities Comorbidity  2    Comorbidities Chronic HA    Examination-Activity Limitations Carry;Sit    Examination-Participation Restrictions Contractor    Stability/Clinical Decision Making Evolving/Moderate complexity    Rehab Potential Good    PT Frequency 2x / week    PT Duration 6 weeks    PT Treatment/Interventions Joint Manipulations;Spinal Manipulations;Dry needling;Passive range of motion;Patient/family education;Manual techniques;Therapeutic exercise;Neuromuscular re-education;Canalith Repostioning;Electrical Stimulation;Moist Heat    PT Next Visit Plan Add in pec stretch for improved shoulder/scapular mobility, progress DNF endurance if needed    PT Home Exercise Plan See education section    Consulted and Agree with Plan of Care Patient           Patient will benefit from skilled therapeutic intervention in order to improve the following deficits and impairments:  Pain, Postural dysfunction, Increased muscle spasms, Increased fascial restricitons, Hypomobility, Decreased strength, Decreased range of motion, Decreased mobility, Decreased coordination, Decreased endurance, Decreased activity tolerance  Visit Diagnosis: Cramp and spasm  Cervicalgia     Problem List Patient Active Problem List   Diagnosis Date Noted  . Nausea 08/21/2019  . Abdominal bloating 08/21/2019  . History of COVID-19 06/24/2019  . URI (upper respiratory infection) 08/26/2018  . Thyroid nodule 02/27/2018  . Stress 01/10/2017  . DUB (dysfunctional uterine bleeding) 07/16/2016  . Low back pain 04/30/2015  . Fatigue 02/08/2015  . Skin lesion of breast 08/06/2014  . Health care maintenance 08/06/2014  . Nephrolithiasis 02/11/2013  . Migraine 02/11/2013  .  History of frequent urinary tract infections 02/11/2013  . Syncope 02/11/2013  . IBS (irritable bowel syndrome) 02/11/2013  . Anemia 02/11/2013  . Vitamin D deficiency 02/11/2013    Myrene Galas, PT DPT 12/05/2019, 8:49 AM  [facility] PHYSICAL AND SPORTS MEDICINE [address] [place], [address] Phone: [phone]   Fax:  [phone]  Name: Tina Fleming MRN: [id number] Date of Birth: May 25, 1981

## 2019-12-07 ENCOUNTER — Other Ambulatory Visit: Payer: Self-pay

## 2019-12-07 ENCOUNTER — Ambulatory Visit: Payer: 59

## 2019-12-07 DIAGNOSIS — R252 Cramp and spasm: Secondary | ICD-10-CM | POA: Diagnosis not present

## 2019-12-07 DIAGNOSIS — M542 Cervicalgia: Secondary | ICD-10-CM

## 2019-12-07 NOTE — Therapy (Signed)
[place] [facility] PHYSICAL [facility] SPORTS MEDICINE [address], [place], [address] Phone: [phone]   Fax:  [phone]  Physical Therapy Treatment  Patient Details  Name: Tina Fleming MRN: [id number] Date of Birth: January 31, 1981 Referring Provider (PT): Lorin Picket MD   Encounter Date: 12/07/2019   PT End of Session - 12/07/19 0752    Visit Number 11    Number of Visits 13    Date for PT Re-Evaluation 12/06/19    PT Start Time 0730    PT Stop Time 0815    PT Time Calculation (min) 45 min    Activity Tolerance No increased pain;Patient tolerated treatment well    Behavior During Therapy [facility] for tasks assessed/performed           Past Medical History:  Diagnosis Date  . Abnormal EKG    no cardiology follow  up was needed  . Anemia    iron suppliments in past-not currently  . Anemia   . Heart murmur    asymtomatic  . History of kidney stones   . IBS (irritable bowel syndrome)   . Migraine   . Nephrolithiasis   . Obesity   . PONV (postoperative nausea and vomiting)    prolonged sedation  . Vitamin D deficiency     Past Surgical History:  Procedure Laterality Date  . APPENDECTOMY  03/21/12   laproscopic appy  . DILATATION & CURETTAGE/HYSTEROSCOPY WITH MYOSURE N/A 03/20/2016   Procedure: DILATATION & CURETTAGE/HYSTEROSCOPY WITH MYOSURE;  Surgeon: Maxie Better, MD;  Location: [facility];  Service: Gynecology;  Laterality: N/A;  45 min. requested  . KNEE ARTHROSCOPY  04/24/2011   Procedure: ARTHROSCOPY KNEE;  Surgeon: Javier Docker;  Location: [facility];  Service: Orthopedics;  Laterality: Right;  /Arthroscopy with debridement, right knee  . LAPAROSCOPIC APPENDECTOMY  03/21/2012   Procedure: APPENDECTOMY LAPAROSCOPIC;  Surgeon: Clovis Pu. Cornett, MD;  Location: [facility];  Service: General;  Laterality: N/A;  . LITHOTRIPSY    . URETHRAL STRICTURE DILATATION     as a child    There were no vitals filed for this  visit.   Subjective Assessment - 12/07/19 0749    Subjective Patient states decreased pain during today's visit. Patient states other than the past week, her pain has only been occuring 1-2x/week. Patient states improvement overall.    Pertinent History Patient reports increased neck pain that began four years prior. Patient states she also has a history of head aches which has been impacting her ability to work and function overall. Patient states increased pain with performing head movements, most specifically rotation and performing cervical flexion. Patient states she has been out of work secondary to increased neck pain and head aches and this is the case until the week of June 14th 2021. Patient states she has been using ice with minimal improvement and managing with pain with pain medications where necessary. Increase in pain 4 months prior without moi. Patient states she had recently receive a occipital nerve block which helped minimally with her symptoms. Patient states she would like to decrease her pain overall so she can return to work.    Limitations Lifting    Diagnostic tests MRI, Xray    Patient Stated Goals Decrease pain    Currently in Pain? No/denies    Pain Onset More than a month ago              TREATMENT   Therapeutic Exercise Cervical Isometric (extension,  Left and Right Side bend) - x3 30sec  Cervical isometric into retraction - x 10 7 sec Prone cervical retraction with extension - x 15 with 5 sec hold Prone cervical retraction with extension with rotation - x15 with 5 sec hold  I's with head off the end of the table - x 10 into cervical retraction - x 20   Performed exercises to address cervical strength    Manual therapy  STM performed to the suboccipital musculature to decrease increased spasms and pain with patient positioned in prone. While performing manual therapy; TrP DN to the L  suboccipital musculature to decrease pain and spasms with patient  positioned in prone .3 mm x 11mm to decrease pain    PT Education - 12/07/19 0751    Education Details form/technique with exercise    Person(s) Educated Patient    Methods Explanation;Demonstration    Comprehension Verbalized understanding;Returned demonstration            PT Short Term Goals - 10/26/19 0839      PT SHORT TERM GOAL #1   Title Patient will be independent with HEP to continue benefits of therapy after discharge.    Baseline Dependent with HEP    Time 2    Period Weeks    Status New    Target Date 11/08/19             PT Long Term Goals - 12/07/19 0820      PT LONG TERM GOAL #1   Title Patinet will be independent with HEP to continue benefits of therapy until after discharge.    Baseline Dependent with HEP; 12/07/2019: mod A for exercise progression    Time 6    Period Weeks    Status On-going      PT LONG TERM GOAL #2   Title Patient will have a significant improvement in her FOTO score to indicate improvement with cervical function and stability.    Baseline FOTO: Assess NV; 10/26/2019: 53; 12/07/2019: 55    Time 6    Period Weeks    Status On-going      PT LONG TERM GOAL #3   Title Patient will have a worst pain of 2/10 to indicate significant improvement with pain and spasms and allow for performance of work related activities    Baseline 8/10; 12/07/2019: 8/10    Time 6    Period Weeks    Status On-going      PT LONG TERM GOAL #4   Title Patient will be able return to occupational tasks without signifcant difficulty and pain to better return to job related duties    Baseline not currently working secondary to pain; 12/07/2019: Increased pain during the day at work    Time 6    Period Weeks    Status On-going                 Plan - 12/07/19 0753    Clinical Impression Statement Patient demonstrates improvement with FOTO score and overall decreased episodes of pain throughout the week compared to previous sessions. Although patient is  improving, she conitnues to have increased intensity of pain with onset of symptoms. Patient will benefit from further skilled therapy to return to prior level of function.    Personal Factors and Comorbidities Comorbidity 2    Comorbidities Chronic HA    Examination-Activity Limitations Carry;Sit    Examination-Participation Restrictions Contractor    Stability/Clinical Decision Making Evolving/Moderate complexity  Rehab Potential Good    PT Frequency 2x / week    PT Duration 6 weeks    PT Treatment/Interventions Joint Manipulations;Spinal Manipulations;Dry needling;Passive range of motion;Patient/family education;Manual techniques;Therapeutic exercise;Neuromuscular re-education;Canalith Repostioning;Electrical Stimulation;Moist Heat    PT Next Visit Plan Add in pec stretch for improved shoulder/scapular mobility, progress DNF endurance if needed    PT Home Exercise Plan See education section    Consulted and Agree with Plan of Care Patient           Patient will benefit from skilled therapeutic intervention in order to improve the following deficits and impairments:  Pain, Postural dysfunction, Increased muscle spasms, Increased fascial restricitons, Hypomobility, Decreased strength, Decreased range of motion, Decreased mobility, Decreased coordination, Decreased endurance, Decreased activity tolerance  Visit Diagnosis: Cramp and spasm  Cervicalgia     Problem List Patient Active Problem List   Diagnosis Date Noted  . Nausea 08/21/2019  . Abdominal bloating 08/21/2019  . History of COVID-19 06/24/2019  . URI (upper respiratory infection) 08/26/2018  . Thyroid nodule 02/27/2018  . Stress 01/10/2017  . DUB (dysfunctional uterine bleeding) 07/16/2016  . Low back pain 04/30/2015  . Fatigue 02/08/2015  . Skin lesion of breast 08/06/2014  . Health care maintenance 08/06/2014  . Nephrolithiasis 02/11/2013  . Migraine 02/11/2013  . History of frequent urinary tract  infections 02/11/2013  . Syncope 02/11/2013  . IBS (irritable bowel syndrome) 02/11/2013  . Anemia 02/11/2013  . Vitamin D deficiency 02/11/2013    Myrene Galas, PT DPT 12/07/2019, 8:25 AM  [place] [facility] PHYSICAL AND SPORTS MEDICINE [address] [place], [address] Phone: [phone]   Fax:  [phone]  Name: LATISE DILLEY MRN: [id number] Date of Birth: November 27, 1980

## 2019-12-07 NOTE — Addendum Note (Signed)
Addended by: Bethanie Dicker on: 12/07/2019 08:27 AM   Modules accepted: Orders

## 2019-12-08 DIAGNOSIS — G43119 Migraine with aura, intractable, without status migrainosus: Secondary | ICD-10-CM | POA: Diagnosis not present

## 2019-12-08 DIAGNOSIS — M5481 Occipital neuralgia: Secondary | ICD-10-CM | POA: Diagnosis not present

## 2019-12-12 ENCOUNTER — Ambulatory Visit: Payer: 59

## 2019-12-12 ENCOUNTER — Other Ambulatory Visit: Payer: Self-pay

## 2019-12-12 DIAGNOSIS — R252 Cramp and spasm: Secondary | ICD-10-CM | POA: Diagnosis not present

## 2019-12-12 DIAGNOSIS — M542 Cervicalgia: Secondary | ICD-10-CM

## 2019-12-12 NOTE — Therapy (Signed)
[facility] [address], [place], [address] Phone: [phone]   Fax:  [phone]  Physical Therapy Treatment  Patient Details  Name: Tina Fleming MRN: [id number] Date of Birth: 1980/12/23 Referring Provider (PT): Lorin Picket MD   Encounter Date: 12/12/2019   PT End of Session - 12/12/19 6834    Visit Number 12    Number of Visits 13    Date for PT Re-Evaluation 12/06/19    PT Start Time 0815    PT Stop Time 0900    PT Time Calculation (min) 45 min    Activity Tolerance No increased pain;Patient tolerated treatment well    Behavior During Therapy [facility] for tasks assessed/performed           Past Medical History:  Diagnosis Date   Abnormal EKG    no cardiology follow  up was needed   Anemia    iron suppliments in past-not currently   Anemia    Heart murmur    asymtomatic   History of kidney stones    IBS (irritable bowel syndrome)    Migraine    Nephrolithiasis    Obesity    PONV (postoperative nausea and vomiting)    prolonged sedation   Vitamin D deficiency     Past Surgical History:  Procedure Laterality Date   APPENDECTOMY  03/21/12   laproscopic appy   DILATATION & CURETTAGE/HYSTEROSCOPY WITH MYOSURE N/A 03/20/2016   Procedure: DILATATION & CURETTAGE/HYSTEROSCOPY WITH MYOSURE;  Surgeon: Maxie Better, MD;  Location: [facility];  Service: Gynecology;  Laterality: N/A;  45 min. requested   KNEE ARTHROSCOPY  04/24/2011   Procedure: ARTHROSCOPY KNEE;  Surgeon: Javier Docker;  Location: [facility];  Service: Orthopedics;  Laterality: Right;  /Arthroscopy with debridement, right knee   LAPAROSCOPIC APPENDECTOMY  03/21/2012   Procedure: APPENDECTOMY LAPAROSCOPIC;  Surgeon: Clovis Pu. Cornett, MD;  [facility]: [facility];  Service: General;  Laterality: N/A;   LITHOTRIPSY     URETHRAL STRICTURE DILATATION     as a child    There were no vitals filed for this  visit.   Subjective Assessment - 12/12/19 0819    Subjective Patient reports she has had no pain this weekend.    Pertinent History Patient reports increased neck pain that began four years prior. Patient states she also has a history of head aches which has been impacting her ability to work and function overall. Patient states increased pain with performing head movements, most specifically rotation and performing cervical flexion. Patient states she has been out of work secondary to increased neck pain and head aches and this is the case until the week of June 14th 2021. Patient states she has been using ice with minimal improvement and managing with pain with pain medications where necessary. Increase in pain 4 months prior without moi. Patient states she had recently receive a occipital nerve block which helped minimally with her symptoms. Patient states she would like to decrease her pain overall so she can return to work.    Limitations Lifting    Diagnostic tests MRI, Xray    Patient Stated Goals Decrease pain    Currently in Pain? No/denies    Pain Onset More than a month ago           TREATMENT   Therapeutic Exercise Cervical Isometric (extension, Left and Right-Side bend) - x3 30sec  Cervical isometric into retraction - x10 7 sec Prone cervical retraction  with extension - x15 with 5 sec hold Prone cervical retraction with extension with rotation - x15 with 5 sec hold  Is, Ys, Ts on Red Physioball with 2lb while in cervical retraction - x 12  Shoulder Flexion 2x15 with 4lbs Shoulder Abduction 2x15 with 4lbs    Performed exercises to address cervical strength    PT Education - 12/12/19 0821    Education Details form/technique with exercise    Person(s) Educated Patient    Methods Explanation;Demonstration    Comprehension Verbalized understanding;Returned demonstration            PT Short Term Goals - 10/26/19 0839      PT SHORT TERM GOAL #1   Title Patient will  be independent with HEP to continue benefits of therapy after discharge.    Baseline Dependent with HEP    Time 2    Period Weeks    Status New    Target Date 11/08/19             PT Long Term Goals - 12/07/19 0820      PT LONG TERM GOAL #1   Title Patinet will be independent with HEP to continue benefits of therapy until after discharge.    Baseline Dependent with HEP; 12/07/2019: mod A for exercise progression    Time 6    Period Weeks    Status On-going      PT LONG TERM GOAL #2   Title Patient will have a significant improvement in her FOTO score to indicate improvement with cervical function and stability.    Baseline FOTO: Assess NV; 10/26/2019: 53; 12/07/2019: 55    Time 6    Period Weeks    Status On-going      PT LONG TERM GOAL #3   Title Patient will have a worst pain of 2/10 to indicate significant improvement with pain and spasms and allow for performance of work related activities    Baseline 8/10; 12/07/2019: 8/10    Time 6    Period Weeks    Status On-going      PT LONG TERM GOAL #4   Title Patient will be able return to occupational tasks without signifcant difficulty and pain to better return to job related duties    Baseline not currently working secondary to pain; 12/07/2019: Increased pain during the day at work    Time 6    Period Weeks    Status On-going                 Plan - 12/12/19 2800    Clinical Impression Statement Focused on strengthening and stability of cervical musculature at todays session at which the patient tolerated well, however she continues to have a pulling sensation during cervical retraction that causes minor discomfort during exercise. Patient is progressing towards goals and will benefit from skilled therapy to return to prior level of function.    Personal Factors and Comorbidities Comorbidity 2    Comorbidities Chronic HA    Examination-Activity Limitations Carry;Sit    Examination-Participation Restrictions Sales executive    Stability/Clinical Decision Making Evolving/Moderate complexity    Clinical Decision Making Moderate    Rehab Potential Good    PT Frequency 2x / week    PT Duration 6 weeks    PT Treatment/Interventions Joint Manipulations;Spinal Manipulations;Dry needling;Passive range of motion;Patient/family education;Manual techniques;Therapeutic exercise;Neuromuscular re-education;Canalith Repostioning;Electrical Stimulation;Moist Heat    PT Next Visit Plan Add in pec stretch for improved shoulder/scapular mobility, progress DNF endurance  if needed    PT Home Exercise Plan See education section    Consulted and Agree with Plan of Care Patient           Patient will benefit from skilled therapeutic intervention in order to improve the following deficits and impairments:  Pain, Postural dysfunction, Increased muscle spasms, Increased fascial restricitons, Hypomobility, Decreased strength, Decreased range of motion, Decreased mobility, Decreased coordination, Decreased endurance, Decreased activity tolerance  Visit Diagnosis: Cramp and spasm  Cervicalgia     Problem List Patient Active Problem List   Diagnosis Date Noted   Nausea 08/21/2019   Abdominal bloating 08/21/2019   History of COVID-19 06/24/2019   URI (upper respiratory infection) 08/26/2018   Thyroid nodule 02/27/2018   Stress 01/10/2017   DUB (dysfunctional uterine bleeding) 07/16/2016   Low back pain 04/30/2015   Fatigue 02/08/2015   Skin lesion of breast 08/06/2014   Health care maintenance 08/06/2014   Nephrolithiasis 02/11/2013   Migraine 02/11/2013   History of frequent urinary tract infections 02/11/2013   Syncope 02/11/2013   IBS (irritable bowel syndrome) 02/11/2013   Anemia 02/11/2013   Vitamin D deficiency 02/11/2013   9:03 AM, 12/12/19 Luanna Salk, SPT Student Physical Therapist [place]  [phone]  Luanna Salk 12/12/2019, 9:03 AM  [facility]ICINE [address] [place], [address] Phone: [phone]   Fax:  [phone]  Name: Tina Fleming MRN: [id number] Date of Birth: 04-Aug-1980

## 2019-12-14 ENCOUNTER — Other Ambulatory Visit: Payer: Self-pay

## 2019-12-14 ENCOUNTER — Ambulatory Visit: Payer: 59

## 2019-12-14 DIAGNOSIS — M542 Cervicalgia: Secondary | ICD-10-CM | POA: Diagnosis not present

## 2019-12-14 DIAGNOSIS — R252 Cramp and spasm: Secondary | ICD-10-CM | POA: Diagnosis not present

## 2019-12-14 NOTE — Therapy (Signed)
[facility] [address], [place], [address] Phone: [phone]   Fax:  [phone]  Physical Therapy Treatment  Patient Details  Name: Tina Fleming MRN: [id number] Date of Birth: [birth year] Referring Provider (PT): Lorin Picket MD   Encounter Date: 12/14/2019   PT End of Session - 12/14/19 0734    Visit Number 13    Number of Visits 13    Date for PT Re-Evaluation 12/06/19    PT Start Time 0730    PT Stop Time 0800    PT Time Calculation (min) 30 min    Activity Tolerance No increased pain;Patient tolerated treatment well    Behavior During Therapy [facility] for tasks assessed/performed           Past Medical History:  Diagnosis Date  . Abnormal EKG    no cardiology follow  up was needed  . Anemia    iron suppliments in past-not currently  . Anemia   . Heart murmur    asymtomatic  . History of kidney stones   . IBS (irritable bowel syndrome)   . Migraine   . Nephrolithiasis   . Obesity   . PONV (postoperative nausea and vomiting)    prolonged sedation  . Vitamin D deficiency     Past Surgical History:  Procedure Laterality Date  . APPENDECTOMY  03/21/12   laproscopic appy  . DILATATION & CURETTAGE/HYSTEROSCOPY WITH MYOSURE N/A 03/20/2016   Procedure: DILATATION & CURETTAGE/HYSTEROSCOPY WITH MYOSURE;  Surgeon: Maxie Better, MD;  Location: [facility];  Service: Gynecology;  Laterality: N/A;  45 min. requested  . KNEE ARTHROSCOPY  04/24/2011   Procedure: ARTHROSCOPY KNEE;  Surgeon: Javier Docker;  Location: [facility];  Service: Orthopedics;  Laterality: Right;  /Arthroscopy with debridement, right knee  . LAPAROSCOPIC APPENDECTOMY  03/21/2012   Procedure: APPENDECTOMY LAPAROSCOPIC;  Surgeon: Clovis Pu. Cornett, MD;  Location: [facility];  Service: General;  Laterality: N/A;  . LITHOTRIPSY    . URETHRAL STRICTURE DILATATION     as a child    There were no vitals filed for  this visit.   Subjective Assessment - 12/14/19 0733    Subjective Patient reports she had soreness after last session that subsided the next day.    Pertinent History Patient reports increased neck pain that began four years prior. Patient states she also has a history of head aches which has been impacting her ability to work and function overall. Patient states increased pain with performing head movements, most specifically rotation and performing cervical flexion. Patient states she has been out of work secondary to increased neck pain and head aches and this is the case until the week of June 14th 2021. Patient states she has been using ice with minimal improvement and managing with pain with pain medications where necessary. Increase in pain 4 months prior without moi. Patient states she had recently receive a occipital nerve block which helped minimally with her symptoms. Patient states she would like to decrease her pain overall so she can return to work.    Limitations Lifting    Diagnostic tests MRI, Xray    Patient Stated Goals Decrease pain    Currently in Pain? No/denies    Pain Onset More than a month ago           TREATMENT   Therapeutic Exercise Cervical Isometric (extension, Left and Right-Side bend) - x3 30sec  Cervical isometric into retraction -  x10 7 sec Prone cervical retraction with extension - x15 with 5 sec hold Prone cervical retraction with extension with rotation - x15 with 5 sec hold  I's, Y's, T's on Red Physioball with 3lb while in cervical retraction - x 12  Shoulder Flexion 2x15 with 5lbs Shoulder Abduction 2x15 with 5lbs  Performed Exercises to improve cervical strength and endurance    PT Education - 12/14/19 0734    Education Details form/technique with exercise    Person(s) Educated Patient    Methods Explanation;Demonstration    Comprehension Verbalized understanding;Returned demonstration            PT Short Term Goals - 10/26/19 0839       PT SHORT TERM GOAL #1   Title Patient will be independent with HEP to continue benefits of therapy after discharge.    Baseline Dependent with HEP    Time 2    Period Weeks    Status New    Target Date 11/08/19             PT Long Term Goals - 12/07/19 0820      PT LONG TERM GOAL #1   Title Patinet will be independent with HEP to continue benefits of therapy until after discharge.    Baseline Dependent with HEP; 12/07/2019: mod A for exercise progression    Time 6    Period Weeks    Status On-going      PT LONG TERM GOAL #2   Title Patient will have a significant improvement in her FOTO score to indicate improvement with cervical function and stability.    Baseline FOTO: Assess NV; 10/26/2019: 53; 12/07/2019: 55    Time 6    Period Weeks    Status On-going      PT LONG TERM GOAL #3   Title Patient will have a worst pain of 2/10 to indicate significant improvement with pain and spasms and allow for performance of work related activities    Baseline 8/10; 12/07/2019: 8/10    Time 6    Period Weeks    Status On-going      PT LONG TERM GOAL #4   Title Patient will be able return to occupational tasks without signifcant difficulty and pain to better return to job related duties    Baseline not currently working secondary to pain; 12/07/2019: Increased pain during the day at work    Time 6    Period Weeks    Status On-going                 Plan - 12/14/19 0735    Clinical Impression Statement Focused on cervical strength and endurance at today's session.  Patient continues to have a pulling sensation in her cervical musculature when performing exercises. She continues to have increase in pain with load to the affected musculature. Patient will benefit from further skilled therapy focused on improving limitations to return to prior level of function.    Personal Factors and Comorbidities Comorbidity 2    Comorbidities Chronic HA    Examination-Activity Limitations  Carry;Sit    Examination-Participation Restrictions Contractor    Stability/Clinical Decision Making Evolving/Moderate complexity    Clinical Decision Making Moderate    Rehab Potential Good    PT Frequency 2x / week    PT Duration 6 weeks    PT Treatment/Interventions Joint Manipulations;Spinal Manipulations;Dry needling;Passive range of motion;Patient/family education;Manual techniques;Therapeutic exercise;Neuromuscular re-education;Canalith Repostioning;Electrical Stimulation;Moist Heat    PT Next Visit Plan Add in  pec stretch for improved shoulder/scapular mobility, progress DNF endurance if needed    PT Home Exercise Plan See education section    Consulted and Agree with Plan of Care Patient           Patient will benefit from skilled therapeutic intervention in order to improve the following deficits and impairments:  Pain, Postural dysfunction, Increased muscle spasms, Increased fascial restricitons, Hypomobility, Decreased strength, Decreased range of motion, Decreased mobility, Decreased coordination, Decreased endurance, Decreased activity tolerance  Visit Diagnosis: Cramp and spasm  Cervicalgia     Problem List Patient Active Problem List   Diagnosis Date Noted  . Nausea 08/21/2019  . Abdominal bloating 08/21/2019  . History of COVID-19 06/24/2019  . URI (upper respiratory infection) 08/26/2018  . Thyroid nodule 02/27/2018  . Stress 01/10/2017  . DUB (dysfunctional uterine bleeding) 07/16/2016  . Low back pain 04/30/2015  . Fatigue 02/08/2015  . Skin lesion of breast 08/06/2014  . Health care maintenance 08/06/2014  . Nephrolithiasis 02/11/2013  . Migraine 02/11/2013  . History of frequent urinary tract infections 02/11/2013  . Syncope 02/11/2013  . IBS (irritable bowel syndrome) 02/11/2013  . Anemia 02/11/2013  . Vitamin D deficiency 02/11/2013   8:03 AM, 12/14/19 Luanna Salk, SPT Student Physical Therapist [place]  [phone]  Luanna Salk 12/14/2019, 8:00 AM  Max Fickle, PT, DPT Physical Therapist - [facility]  Outpatient Physical Therapy- Main Campus [phone]    [facility] PHYSICAL AND SPORTS MEDICINE [address] [place], [address] Phone: [phone]   Fax:  [phone]  Name: Tina Fleming MRN: [id number] Date of Birth: [birth year]

## 2019-12-19 ENCOUNTER — Ambulatory Visit: Payer: 59

## 2019-12-19 ENCOUNTER — Other Ambulatory Visit: Payer: Self-pay

## 2019-12-19 DIAGNOSIS — R252 Cramp and spasm: Secondary | ICD-10-CM

## 2019-12-19 DIAGNOSIS — M542 Cervicalgia: Secondary | ICD-10-CM | POA: Diagnosis not present

## 2019-12-19 NOTE — Therapy (Signed)
[facility]ORTS MEDICINE [address] [place], [address] Phone: [phone]   Fax:  [phone]  Physical Therapy Treatment  Patient Details  Name: Tina Fleming MRN: [id number] Date of Birth: [birth year] Referring Provider (PT): Lorin Picket MD   Encounter Date: 12/19/2019   PT End of Session - 12/19/19 0826    Visit Number 14    Number of Visits 21    Date for PT Re-Evaluation 01/04/20    PT Start Time 0815    PT Stop Time 0845    PT Time Calculation (min) 30 min    Activity Tolerance No increased pain;Patient tolerated treatment well    Behavior During Therapy [facility] for tasks assessed/performed           Past Medical History:  Diagnosis Date  . Abnormal EKG    no cardiology follow  up was needed  . Anemia    iron suppliments in past-not currently  . Anemia   . Heart murmur    asymtomatic  . History of kidney stones   . IBS (irritable bowel syndrome)   . Migraine   . Nephrolithiasis   . Obesity   . PONV (postoperative nausea and vomiting)    prolonged sedation  . Vitamin D deficiency     Past Surgical History:  Procedure Laterality Date  . APPENDECTOMY  03/21/12   laproscopic appy  . DILATATION & CURETTAGE/HYSTEROSCOPY WITH MYOSURE N/A 03/20/2016   Procedure: DILATATION & CURETTAGE/HYSTEROSCOPY WITH MYOSURE;  Surgeon: Maxie Better, MD;  Location: [facility];  Service: Gynecology;  Laterality: N/A;  45 min. requested  . KNEE ARTHROSCOPY  04/24/2011   Procedure: ARTHROSCOPY KNEE;  Surgeon: Javier Docker;  Location: [facility];  Service: Orthopedics;  Laterality: Right;  /Arthroscopy with debridement, right knee  . LAPAROSCOPIC APPENDECTOMY  03/21/2012   Procedure: APPENDECTOMY LAPAROSCOPIC;  Surgeon: Clovis Pu. Cornett, MD;  Location: [facility];  Service: General;  Laterality: N/A;  . LITHOTRIPSY    . URETHRAL STRICTURE DILATATION     as a child    There were no vitals filed for this  visit.   Subjective Assessment - 12/19/19 0824    Subjective Patient reports she had soreness after last session and that her neck continues to be sore after working yesterday.    Pertinent History Patient reports increased neck pain that began four years prior. Patient states she also has a history of head aches which has been impacting her ability to work and function overall. Patient states increased pain with performing head movements, most specifically rotation and performing cervical flexion. Patient states she has been out of work secondary to increased neck pain and head aches and this is the case until the week of June 14th 2021. Patient states she has been using ice with minimal improvement and managing with pain with pain medications where necessary. Increase in pain 4 months prior without moi. Patient states she had recently receive a occipital nerve block which helped minimally with her symptoms. Patient states she would like to decrease her pain overall so she can return to work.    Limitations Lifting    Diagnostic tests MRI, Xray    Patient Stated Goals Decrease pain    Currently in Pain? No/denies    Pain Onset More than a month ago               TREATMENT   Therapeutic Exercise Cervical Isometric (extension, Left and Right-Side bend) -  x1 30sec  Prone cervical retraction with extension - x15 with 5 sec hold Prone cervical retraction with extension with rotation - x15 with 5 sec hold  Resisted straight arm shoulder extension with GTB - x 20   Manual therapy  STM performed to the suboccipital musculature to decrease increased spasms and pain with patient positioned in prone. While performing manual therapy; TrP DN to the L  suboccipital musculature to decrease pain and spasms with patient positioned in prone .3 mm x 32mm to decrease pain   Performed Exercises to improve cervical strength and endurance      PT Education - 12/19/19 0825    Education Details  form/technique with exercise    Person(s) Educated Patient    Methods Explanation;Demonstration    Comprehension Verbalized understanding;Returned demonstration            PT Short Term Goals - 10/26/19 0839      PT SHORT TERM GOAL #1   Title Patient will be independent with HEP to continue benefits of therapy after discharge.    Baseline Dependent with HEP    Time 2    Period Weeks    Status New    Target Date 11/08/19             PT Long Term Goals - 12/07/19 0820      PT LONG TERM GOAL #1   Title Patinet will be independent with HEP to continue benefits of therapy until after discharge.    Baseline Dependent with HEP; 12/07/2019: mod A for exercise progression    Time 6    Period Weeks    Status On-going      PT LONG TERM GOAL #2   Title Patient will have a significant improvement in her FOTO score to indicate improvement with cervical function and stability.    Baseline FOTO: Assess NV; 10/26/2019: 53; 12/07/2019: 55    Time 6    Period Weeks    Status On-going      PT LONG TERM GOAL #3   Title Patient will have a worst pain of 2/10 to indicate significant improvement with pain and spasms and allow for performance of work related activities    Baseline 8/10; 12/07/2019: 8/10    Time 6    Period Weeks    Status On-going      PT LONG TERM GOAL #4   Title Patient will be able return to occupational tasks without signifcant difficulty and pain to better return to job related duties    Baseline not currently working secondary to pain; 12/07/2019: Increased pain during the day at work    Time 6    Period Weeks    Status On-going                 Plan - 12/19/19 0851    Clinical Impression Statement Patient demonstrates reproduction of symptoms intiially  when performing dry needling, this however is decreased after dry needling and the performance of retraction based exercises. Although patient is improving, she continues to have increased pain ~ 2 / week.  Patient is making improvements and will benefit from further skilled therapy to return to prior level of function.    Personal Factors and Comorbidities Comorbidity 2    Comorbidities Chronic HA    Examination-Activity Limitations Carry;Sit    Examination-Participation Restrictions Contractor    Stability/Clinical Decision Making Evolving/Moderate complexity    Rehab Potential Good    PT Frequency 2x / week  PT Duration 6 weeks    PT Treatment/Interventions Joint Manipulations;Spinal Manipulations;Dry needling;Passive range of motion;Patient/family education;Manual techniques;Therapeutic exercise;Neuromuscular re-education;Canalith Repostioning;Electrical Stimulation;Moist Heat    PT Next Visit Plan Add in pec stretch for improved shoulder/scapular mobility, progress DNF endurance if needed    PT Home Exercise Plan See education section    Consulted and Agree with Plan of Care Patient           Patient will benefit from skilled therapeutic intervention in order to improve the following deficits and impairments:  Pain, Postural dysfunction, Increased muscle spasms, Increased fascial restricitons, Hypomobility, Decreased strength, Decreased range of motion, Decreased mobility, Decreased coordination, Decreased endurance, Decreased activity tolerance  Visit Diagnosis: Cramp and spasm  Cervicalgia     Problem List Patient Active Problem List   Diagnosis Date Noted  . Nausea 08/21/2019  . Abdominal bloating 08/21/2019  . History of COVID-19 06/24/2019  . URI (upper respiratory infection) 08/26/2018  . Thyroid nodule 02/27/2018  . Stress 01/10/2017  . DUB (dysfunctional uterine bleeding) 07/16/2016  . Low back pain 04/30/2015  . Fatigue 02/08/2015  . Skin lesion of breast 08/06/2014  . Health care maintenance 08/06/2014  . Nephrolithiasis 02/11/2013  . Migraine 02/11/2013  . History of frequent urinary tract infections 02/11/2013  . Syncope 02/11/2013  . IBS (irritable  bowel syndrome) 02/11/2013  . Anemia 02/11/2013  . Vitamin D deficiency 02/11/2013    Myrene Galas, PT DPT 12/19/2019, 9:03 AM  [facility] PHYSICAL AND SPORTS MEDICINE [address] [place], [address] Phone: [phone]   Fax:  [phone]  Name: DOROTHYE BERNI MRN: [id number] Date of Birth: 1981-02-27

## 2019-12-21 ENCOUNTER — Ambulatory Visit: Payer: 59

## 2019-12-21 ENCOUNTER — Other Ambulatory Visit: Payer: Self-pay

## 2019-12-21 DIAGNOSIS — M542 Cervicalgia: Secondary | ICD-10-CM | POA: Diagnosis not present

## 2019-12-21 DIAGNOSIS — R252 Cramp and spasm: Secondary | ICD-10-CM | POA: Diagnosis not present

## 2019-12-21 NOTE — Therapy (Signed)
[facility] PHYS[facility]ORTS MEDICINE [address] [place], [address] Phone: [phone]   Fax:  [phone]  Physical Therapy Treatment  Patient Details  Name: Tina Fleming MRN: [id number] Date of Birth: April 09, 1981 Referring Provider (PT): Lorin Picket MD   Encounter Date: 12/21/2019   PT End of Session - 12/21/19 0742    Visit Number 15    Number of Visits 21    Date for PT Re-Evaluation 01/04/20    PT Start Time 0730    PT Stop Time 0815    PT Time Calculation (min) 45 min    Activity Tolerance No increased pain;Patient tolerated treatment well    Behavior During Therapy [facility] for tasks assessed/performed           Past Medical History:  Diagnosis Date  . Abnormal EKG    no cardiology follow  up was needed  . Anemia    iron suppliments in past-not currently  . Anemia   . Heart murmur    asymtomatic  . History of kidney stones   . IBS (irritable bowel syndrome)   . Migraine   . Nephrolithiasis   . Obesity   . PONV (postoperative nausea and vomiting)    prolonged sedation  . Vitamin D deficiency     Past Surgical History:  Procedure Laterality Date  . APPENDECTOMY  03/21/12   laproscopic appy  . DILATATION & CURETTAGE/HYSTEROSCOPY WITH MYOSURE N/A 03/20/2016   Procedure: DILATATION & CURETTAGE/HYSTEROSCOPY WITH MYOSURE;  Surgeon: Maxie Better, MD;  Location: [facility];  Service: Gynecology;  Laterality: N/A;  45 min. requested  . KNEE ARTHROSCOPY  04/24/2011   Procedure: ARTHROSCOPY KNEE;  Surgeon: Javier Docker;  Location: [facility];  Service: Orthopedics;  Laterality: Right;  /Arthroscopy with debridement, right knee  . LAPAROSCOPIC APPENDECTOMY  03/21/2012   Procedure: APPENDECTOMY LAPAROSCOPIC;  Surgeon: Clovis Pu. Cornett, MD;  Location: [facility];  Service: General;  Laterality: N/A;  . LITHOTRIPSY    . URETHRAL STRICTURE DILATATION     as a child    There were no vitals filed for this  visit.   Subjective Assessment - 12/21/19 0740    Subjective Patient reports episodes of dizziness to the point where she was going to passout after getting up out of bed and it remained when she laid back down.    Pertinent History Patient reports increased neck pain that began four years prior. Patient states she also has a history of head aches which has been impacting her ability to work and function overall. Patient states increased pain with performing head movements, most specifically rotation and performing cervical flexion. Patient states she has been out of work secondary to increased neck pain and head aches and this is the case until the week of June 14th 2021. Patient states she has been using ice with minimal improvement and managing with pain with pain medications where necessary. Increase in pain 4 months prior without moi. Patient states she had recently receive a occipital nerve block which helped minimally with her symptoms. Patient states she would like to decrease her pain overall so she can return to work.    Limitations Lifting    Diagnostic tests MRI, Xray    Patient Stated Goals Decrease pain    Currently in Pain? Yes    Pain Score 2     Pain Location Neck    Pain Onset More than a month ago  INTERVENTIONS   Manual Therapy  -Suboccipital Release and STM   Therapeutic Exercise  -VOR -VOR Cancellation  -Eye Tracking (vertical, horizontal, diagonals)     PT Education - 12/21/19 0741    Education Details form/technique with exercise    Person(s) Educated Patient    Methods Explanation;Demonstration    Comprehension Verbalized understanding;Returned demonstration            PT Short Term Goals - 10/26/19 0839      PT SHORT TERM GOAL #1   Title Patient will be independent with HEP to continue benefits of therapy after discharge.    Baseline Dependent with HEP    Time 2    Period Weeks    Status New    Target Date 11/08/19             PT  Long Term Goals - 12/07/19 0820      PT LONG TERM GOAL #1   Title Patinet will be independent with HEP to continue benefits of therapy until after discharge.    Baseline Dependent with HEP; 12/07/2019: mod A for exercise progression    Time 6    Period Weeks    Status On-going      PT LONG TERM GOAL #2   Title Patient will have a significant improvement in her FOTO score to indicate improvement with cervical function and stability.    Baseline FOTO: Assess NV; 10/26/2019: 53; 12/07/2019: 55    Time 6    Period Weeks    Status On-going      PT LONG TERM GOAL #3   Title Patient will have a worst pain of 2/10 to indicate significant improvement with pain and spasms and allow for performance of work related activities    Baseline 8/10; 12/07/2019: 8/10    Time 6    Period Weeks    Status On-going      PT LONG TERM GOAL #4   Title Patient will be able return to occupational tasks without signifcant difficulty and pain to better return to job related duties    Baseline not currently working secondary to pain; 12/07/2019: Increased pain during the day at work    Time 6    Period Weeks    Status On-going                 Plan - 12/21/19 0743    Clinical Impression Statement Addressed patient's vestibular system at beginning of session to see if dizziness could be reproduced that patient had last night.  No dizziness occurred during the testing, however, patient had some "feeling weird" sensation when eye tracking diagonals at end range. Performed suboccipital release and STM to occipitals and the patient tolerated it well but had no change in symptoms.  Will address again if patient continues to have episodes.  Patient will continue to benefit from skilled therapy to return to prior level of function.    Personal Factors and Comorbidities Comorbidity 2    Comorbidities Chronic HA    Examination-Activity Limitations Carry;Sit    Examination-Participation Restrictions Contractor     Stability/Clinical Decision Making Evolving/Moderate complexity    Clinical Decision Making Moderate    Rehab Potential Good    PT Frequency 2x / week    PT Duration 6 weeks    PT Treatment/Interventions Joint Manipulations;Spinal Manipulations;Dry needling;Passive range of motion;Patient/family education;Manual techniques;Therapeutic exercise;Neuromuscular re-education;Canalith Repostioning;Electrical Stimulation;Moist Heat    PT Next Visit Plan Add in pec stretch for improved shoulder/scapular mobility, progress  DNF endurance if needed    PT Home Exercise Plan See education section    Consulted and Agree with Plan of Care Patient           Patient will benefit from skilled therapeutic intervention in order to improve the following deficits and impairments:  Pain, Postural dysfunction, Increased muscle spasms, Increased fascial restricitons, Hypomobility, Decreased strength, Decreased range of motion, Decreased mobility, Decreased coordination, Decreased endurance, Decreased activity tolerance  Visit Diagnosis: Cramp and spasm  Cervicalgia     Problem List Patient Active Problem List   Diagnosis Date Noted  . Nausea 08/21/2019  . Abdominal bloating 08/21/2019  . History of COVID-19 06/24/2019  . URI (upper respiratory infection) 08/26/2018  . Thyroid nodule 02/27/2018  . Stress 01/10/2017  . DUB (dysfunctional uterine bleeding) 07/16/2016  . Low back pain 04/30/2015  . Fatigue 02/08/2015  . Skin lesion of breast 08/06/2014  . Health care maintenance 08/06/2014  . Nephrolithiasis 02/11/2013  . Migraine 02/11/2013  . History of frequent urinary tract infections 02/11/2013  . Syncope 02/11/2013  . IBS (irritable bowel syndrome) 02/11/2013  . Anemia 02/11/2013  . Vitamin D deficiency 02/11/2013   8:15 AM, 12/21/19 Luanna Salk, SPT Student Physical Therapist [place]  [phone]  Luanna Salk 12/21/2019, 8:12 AM  [facility]  PHYSICAL AND SPORTS MEDICINE [address], [place], [address] Phone: [phone]   Fax:  [phone]  Name: DORTHEA MAINA MRN: [id number] Date of Birth: [birth year]

## 2019-12-26 ENCOUNTER — Other Ambulatory Visit: Payer: Self-pay

## 2019-12-26 ENCOUNTER — Ambulatory Visit: Payer: 59 | Attending: Neurology

## 2019-12-26 DIAGNOSIS — M542 Cervicalgia: Secondary | ICD-10-CM | POA: Diagnosis not present

## 2019-12-26 DIAGNOSIS — R252 Cramp and spasm: Secondary | ICD-10-CM | POA: Insufficient documentation

## 2019-12-26 NOTE — Therapy (Signed)
[facility] PHYS[facility]TS MEDICINE [address], [place], [address] Phone: [phone]   Fax:  [phone]  Physical Therapy Treatment  Patient Details  Name: Tina Fleming MRN: [id number] Date of Birth: June 13, 1980 Referring Provider (PT): Lorin Picket MD   Encounter Date: 12/26/2019   PT End of Session - 12/26/19 1350    Visit Number 16    Number of Visits 21    Date for PT Re-Evaluation 01/04/20    PT Start Time 1345    PT Stop Time 1430    PT Time Calculation (min) 45 min    Activity Tolerance No increased pain;Patient tolerated treatment well    Behavior During Therapy [facility] for tasks assessed/performed           Past Medical History:  Diagnosis Date  . Abnormal EKG    no cardiology follow  up was needed  . Anemia    iron suppliments in past-not currently  . Anemia   . Heart murmur    asymtomatic  . History of kidney stones   . IBS (irritable bowel syndrome)   . Migraine   . Nephrolithiasis   . Obesity   . PONV (postoperative nausea and vomiting)    prolonged sedation  . Vitamin D deficiency     Past Surgical History:  Procedure Laterality Date  . APPENDECTOMY  03/21/12   laproscopic appy  . DILATATION & CURETTAGE/HYSTEROSCOPY WITH MYOSURE N/A 03/20/2016   Procedure: DILATATION & CURETTAGE/HYSTEROSCOPY WITH MYOSURE;  Surgeon: Maxie Better, MD;  Location: [facility];  Service: Gynecology;  Laterality: N/A;  45 min. requested  . KNEE ARTHROSCOPY  04/24/2011   Procedure: ARTHROSCOPY KNEE;  Surgeon: Javier Docker;  Location: [facility];  Service: Orthopedics;  Laterality: Right;  /Arthroscopy with debridement, right knee  . LAPAROSCOPIC APPENDECTOMY  03/21/2012   Procedure: APPENDECTOMY LAPAROSCOPIC;  Surgeon: Clovis Pu. Cornett, MD;  Location: [facility];  Service: General;  Laterality: N/A;  . LITHOTRIPSY    . URETHRAL STRICTURE DILATATION     as a child    There were no vitals filed for this  visit.   Subjective Assessment - 12/26/19 1348    Subjective Patient reports that she had some pain at work today but does not have any at the beginning of the session.    Pertinent History Patient reports increased neck pain that began four years prior. Patient states she also has a history of head aches which has been impacting her ability to work and function overall. Patient states increased pain with performing head movements, most specifically rotation and performing cervical flexion. Patient states she has been out of work secondary to increased neck pain and head aches and this is the case until the week of June 14th 2021. Patient states she has been using ice with minimal improvement and managing with pain with pain medications where necessary. Increase in pain 4 months prior without moi. Patient states she had recently receive a occipital nerve block which helped minimally with her symptoms. Patient states she would like to decrease her pain overall so she can return to work.    Limitations Lifting    Diagnostic tests MRI, Xray    Patient Stated Goals Decrease pain    Currently in Pain? No/denies    Pain Onset More than a month ago           INTERVENTIONS   Therapeutic Exercise Cervical Isometric (extension, Left and Right-Side bend) - x3 30sec  Cervical isometric into retraction - x10 7 sec Standing Cervical Retraction OMEGA  3x10 5lbs  Prone cervical retraction with extension - x15 with 5 sec hold Prone cervical retraction with extension with rotation - x15 with 5 sec hold  Rows (focus on scapular squeeze) on OMEGA 20lbs    Performed Exercises to improve cervical strength and endurance     PT Education - 12/26/19 1349    Education Details form/technique with exercise    Person(s) Educated Patient    Methods Explanation;Demonstration    Comprehension Verbalized understanding;Returned demonstration            PT Short Term Goals - 10/26/19 0839      PT SHORT TERM  GOAL #1   Title Patient will be independent with HEP to continue benefits of therapy after discharge.    Baseline Dependent with HEP    Time 2    Period Weeks    Status New    Target Date 11/08/19             PT Long Term Goals - 12/07/19 0820      PT LONG TERM GOAL #1   Title Patinet will be independent with HEP to continue benefits of therapy until after discharge.    Baseline Dependent with HEP; 12/07/2019: mod A for exercise progression    Time 6    Period Weeks    Status On-going      PT LONG TERM GOAL #2   Title Patient will have a significant improvement in her FOTO score to indicate improvement with cervical function and stability.    Baseline FOTO: Assess NV; 10/26/2019: 53; 12/07/2019: 55    Time 6    Period Weeks    Status On-going      PT LONG TERM GOAL #3   Title Patient will have a worst pain of 2/10 to indicate significant improvement with pain and spasms and allow for performance of work related activities    Baseline 8/10; 12/07/2019: 8/10    Time 6    Period Weeks    Status On-going      PT LONG TERM GOAL #4   Title Patient will be able return to occupational tasks without signifcant difficulty and pain to better return to job related duties    Baseline not currently working secondary to pain; 12/07/2019: Increased pain during the day at work    Time 6    Period Weeks    Status On-going                 Plan - 12/26/19 1350    Clinical Impression Statement Continued to address patient's cervical musculature and mobility during todays session.  Patient continues to have pain while working but the frequency of the pain has been decreasing overall. Patient began to have pain radiate to her ear during session after performing isometric cervical retraction and decreased after right cervical side-bending. Patient will benefit from skilled therapy to progress towards goals and return to prior level of function.    Personal Factors and Comorbidities  Comorbidity 2    Comorbidities Chronic HA    Examination-Activity Limitations Carry;Sit    Examination-Participation Restrictions Contractor    Stability/Clinical Decision Making Evolving/Moderate complexity    Clinical Decision Making Moderate    Rehab Potential Good    PT Frequency 2x / week    PT Duration 6 weeks    PT Treatment/Interventions Joint Manipulations;Spinal Manipulations;Dry needling;Passive range of motion;Patient/family education;Manual techniques;Therapeutic exercise;Neuromuscular re-education;Canalith Repostioning;Electrical  Stimulation;Moist Heat    PT Next Visit Plan Add in pec stretch for improved shoulder/scapular mobility, progress DNF endurance if needed    PT Home Exercise Plan See education section    Consulted and Agree with Plan of Care Patient           Patient will benefit from skilled therapeutic intervention in order to improve the following deficits and impairments:  Pain, Postural dysfunction, Increased muscle spasms, Increased fascial restricitons, Hypomobility, Decreased strength, Decreased range of motion, Decreased mobility, Decreased coordination, Decreased endurance, Decreased activity tolerance  Visit Diagnosis: Cramp and spasm  Cervicalgia     Problem List Patient Active Problem List   Diagnosis Date Noted  . Nausea 08/21/2019  . Abdominal bloating 08/21/2019  . History of COVID-19 06/24/2019  . URI (upper respiratory infection) 08/26/2018  . Thyroid nodule 02/27/2018  . Stress 01/10/2017  . DUB (dysfunctional uterine bleeding) 07/16/2016  . Low back pain 04/30/2015  . Fatigue 02/08/2015  . Skin lesion of breast 08/06/2014  . Health care maintenance 08/06/2014  . Nephrolithiasis 02/11/2013  . Migraine 02/11/2013  . History of frequent urinary tract infections 02/11/2013  . Syncope 02/11/2013  . IBS (irritable bowel syndrome) 02/11/2013  . Anemia 02/11/2013  . Vitamin D deficiency 02/11/2013   2:32 PM, 12/26/19 Tina Fleming, SPT Student Physical Therapist [place]  [phone]  Tina Fleming 12/26/2019, 2:31 PM  [facility] PHYSICAL AND SPORTS MEDICINE [address] [place], [address] Phone: [phone]   Fax:  [phone]  Name: Tina Fleming MRN: [id number] Date of Birth: 1981-02-16

## 2019-12-28 ENCOUNTER — Other Ambulatory Visit: Payer: Self-pay

## 2019-12-28 ENCOUNTER — Ambulatory Visit: Payer: 59

## 2019-12-28 DIAGNOSIS — M542 Cervicalgia: Secondary | ICD-10-CM

## 2019-12-28 DIAGNOSIS — R252 Cramp and spasm: Secondary | ICD-10-CM | POA: Diagnosis not present

## 2019-12-28 NOTE — Therapy (Signed)
[facility] PHYS[facility] SPORTS MEDICINE [address] [place], [address] Phone: [phone]   Fax:  [phone]  Physical Therapy Treatment  Patient Details  Name: Tina Fleming MRN: [id number] Date of Birth: 1980-08-23 Referring Provider (PT): Lorin Picket MD   Encounter Date: 12/28/2019   PT End of Session - 12/28/19 1301    Visit Number 17    Number of Visits 21    Date for PT Re-Evaluation 01/04/20    PT Start Time 1300    PT Stop Time 1345    PT Time Calculation (min) 45 min    Activity Tolerance No increased pain;Patient tolerated treatment well    Behavior During Therapy [facility] for tasks assessed/performed           Past Medical History:  Diagnosis Date  . Abnormal EKG    no cardiology follow  up was needed  . Anemia    iron suppliments in past-not currently  . Anemia   . Heart murmur    asymtomatic  . History of kidney stones   . IBS (irritable bowel syndrome)   . Migraine   . Nephrolithiasis   . Obesity   . PONV (postoperative nausea and vomiting)    prolonged sedation  . Vitamin D deficiency     Past Surgical History:  Procedure Laterality Date  . APPENDECTOMY  03/21/12   laproscopic appy  . DILATATION & CURETTAGE/HYSTEROSCOPY WITH MYOSURE N/A 03/20/2016   Procedure: DILATATION & CURETTAGE/HYSTEROSCOPY WITH MYOSURE;  Surgeon: Maxie Better, MD;  Location: [facility];  Service: Gynecology;  Laterality: N/A;  45 min. requested  . KNEE ARTHROSCOPY  04/24/2011   Procedure: ARTHROSCOPY KNEE;  Surgeon: Javier Docker;  Location: [facility];  Service: Orthopedics;  Laterality: Right;  /Arthroscopy with debridement, right knee  . LAPAROSCOPIC APPENDECTOMY  03/21/2012   Procedure: APPENDECTOMY LAPAROSCOPIC;  Surgeon: Clovis Pu. Cornett, MD;  Location: [facility];  Service: General;  Laterality: N/A;  . LITHOTRIPSY    . URETHRAL STRICTURE DILATATION     as a child    There were no vitals filed for this  visit.   Subjective Assessment - 12/28/19 1300    Subjective Patient reports that she has a cramping sensation on the left side of her neck towards the back along with soreness.    Pertinent History Patient reports increased neck pain that began four years prior. Patient states she also has a history of head aches which has been impacting her ability to work and function overall. Patient states increased pain with performing head movements, most specifically rotation and performing cervical flexion. Patient states she has been out of work secondary to increased neck pain and head aches and this is the case until the week of June 14th 2021. Patient states she has been using ice with minimal improvement and managing with pain with pain medications where necessary. Increase in pain 4 months prior without moi. Patient states she had recently receive a occipital nerve block which helped minimally with her symptoms. Patient states she would like to decrease her pain overall so she can return to work.    Limitations Lifting    Diagnostic tests MRI, Xray    Patient Stated Goals Decrease pain    Currently in Pain? No/denies    Pain Onset More than a month ago          INTERVENTIONS   Manual Therapy  Dry Needling 0.30x13mm to left cervical extensors  Ischemic Compression  Therapeutic Exercise Cervical Isometric (extension, Left and Right-Side bend) - x3 30sec  Cervical isometric into retraction - x10 7 sec Cervical Rotation to L with PNF 3x15sec  L Cervical Stretch 3x30sec  L side bend isometric 3x30sec  Cervical retraction with rotation x10 bilat. Cervical Extension with towel x10  Performed exercises to reduce pain     PT Education - 12/28/19 1301    Education Details form/technique with exercise    Person(s) Educated Patient    Methods Explanation;Demonstration    Comprehension Verbalized understanding;Returned demonstration            PT Short Term Goals - 10/26/19 0839       PT SHORT TERM GOAL #1   Title Patient will be independent with HEP to continue benefits of therapy after discharge.    Baseline Dependent with HEP    Time 2    Period Weeks    Status New    Target Date 11/08/19             PT Long Term Goals - 12/07/19 0820      PT LONG TERM GOAL #1   Title Patinet will be independent with HEP to continue benefits of therapy until after discharge.    Baseline Dependent with HEP; 12/07/2019: mod A for exercise progression    Time 6    Period Weeks    Status On-going      PT LONG TERM GOAL #2   Title Patient will have a significant improvement in her FOTO score to indicate improvement with cervical function and stability.    Baseline FOTO: Assess NV; 10/26/2019: 53; 12/07/2019: 55    Time 6    Period Weeks    Status On-going      PT LONG TERM GOAL #3   Title Patient will have a worst pain of 2/10 to indicate significant improvement with pain and spasms and allow for performance of work related activities    Baseline 8/10; 12/07/2019: 8/10    Time 6    Period Weeks    Status On-going      PT LONG TERM GOAL #4   Title Patient will be able return to occupational tasks without signifcant difficulty and pain to better return to job related duties    Baseline not currently working secondary to pain; 12/07/2019: Increased pain during the day at work    Time 6    Period Weeks    Status On-going                 Plan - 12/28/19 1302    Clinical Impression Statement Addressed patients neck cramps and soreness along the lateral aspect of the Left cervical extensors at the beginning of the session using dry needling. Patients soreness decreased after dry needling, but she continues to have a pressure inside her ear. PNF and stretching helped increase cervical rotation with less pain when near end range. Patient will continue to benefit from further skilled therapy to return to prior level of function.    Personal Factors and Comorbidities  Comorbidity 2    Comorbidities Chronic HA    Examination-Activity Limitations Carry;Sit    Examination-Participation Restrictions Contractor    Stability/Clinical Decision Making Evolving/Moderate complexity    Clinical Decision Making Moderate    Rehab Potential Good    PT Frequency 2x / week    PT Duration 6 weeks    PT Treatment/Interventions Joint Manipulations;Spinal Manipulations;Dry needling;Passive range of motion;Patient/family education;Manual techniques;Therapeutic exercise;Neuromuscular re-education;Canalith Repostioning;Electrical Stimulation;Moist  Heat    PT Next Visit Plan Add in pec stretch for improved shoulder/scapular mobility, progress DNF endurance if needed    PT Home Exercise Plan See education section    Consulted and Agree with Plan of Care Patient           Patient will benefit from skilled therapeutic intervention in order to improve the following deficits and impairments:  Pain, Postural dysfunction, Increased muscle spasms, Increased fascial restricitons, Hypomobility, Decreased strength, Decreased range of motion, Decreased mobility, Decreased coordination, Decreased endurance, Decreased activity tolerance  Visit Diagnosis: Cramp and spasm  Cervicalgia     Problem List Patient Active Problem List   Diagnosis Date Noted  . Nausea 08/21/2019  . Abdominal bloating 08/21/2019  . History of COVID-19 06/24/2019  . URI (upper respiratory infection) 08/26/2018  . Thyroid nodule 02/27/2018  . Stress 01/10/2017  . DUB (dysfunctional uterine bleeding) 07/16/2016  . Low back pain 04/30/2015  . Fatigue 02/08/2015  . Skin lesion of breast 08/06/2014  . Health care maintenance 08/06/2014  . Nephrolithiasis 02/11/2013  . Migraine 02/11/2013  . History of frequent urinary tract infections 02/11/2013  . Syncope 02/11/2013  . IBS (irritable bowel syndrome) 02/11/2013  . Anemia 02/11/2013  . Vitamin D deficiency 02/11/2013   1:46 PM, 12/28/19 Tina Fleming, SPT Student Physical Therapist [place]  [phone]  Tina Fleming 12/28/2019, 1:45 PM  [place] [facility] PHYSICAL AND SPORTS MEDICINE [address], [place], [address] Phone: [phone]   Fax:  [phone]  Name: Tina Fleming MRN: [id number] Date of Birth: 1980-12-07

## 2020-01-02 ENCOUNTER — Other Ambulatory Visit: Payer: Self-pay

## 2020-01-02 ENCOUNTER — Ambulatory Visit: Payer: 59

## 2020-01-02 DIAGNOSIS — M542 Cervicalgia: Secondary | ICD-10-CM | POA: Diagnosis not present

## 2020-01-02 DIAGNOSIS — R252 Cramp and spasm: Secondary | ICD-10-CM | POA: Diagnosis not present

## 2020-01-03 NOTE — Therapy (Signed)
[facility] PHYS[facility] SPORTS MEDICINE [address], [place], [address] Phone: [phone]   Fax:  [phone]  Physical Therapy Treatment  Patient Details  Name: Tina Fleming MRN: [id number] Date of Birth: 08/01/1980 Referring Provider (PT): Lorin Picket MD   Encounter Date: 01/02/2020   PT End of Session - 01/02/20 1739    Visit Number 18    Number of Visits 21    Date for PT Re-Evaluation 01/04/20    PT Start Time 1700    PT Stop Time 1745    PT Time Calculation (min) 45 min    Activity Tolerance No increased pain;Patient tolerated treatment well    Behavior During Therapy [facility] for tasks assessed/performed           Past Medical History:  Diagnosis Date  . Abnormal EKG    no cardiology follow  up was needed  . Anemia    iron suppliments in past-not currently  . Anemia   . Heart murmur    asymtomatic  . History of kidney stones   . IBS (irritable bowel syndrome)   . Migraine   . Nephrolithiasis   . Obesity   . PONV (postoperative nausea and vomiting)    prolonged sedation  . Vitamin D deficiency     Past Surgical History:  Procedure Laterality Date  . APPENDECTOMY  03/21/12   laproscopic appy  . DILATATION & CURETTAGE/HYSTEROSCOPY WITH MYOSURE N/A 03/20/2016   Procedure: DILATATION & CURETTAGE/HYSTEROSCOPY WITH MYOSURE;  Surgeon: Maxie Better, MD;  Location: [facility];  Service: Gynecology;  Laterality: N/A;  45 min. requested  . KNEE ARTHROSCOPY  04/24/2011   Procedure: ARTHROSCOPY KNEE;  Surgeon: Javier Docker;  Location: [facility];  Service: Orthopedics;  Laterality: Right;  /Arthroscopy with debridement, right knee  . LAPAROSCOPIC APPENDECTOMY  03/21/2012   Procedure: APPENDECTOMY LAPAROSCOPIC;  Surgeon: Clovis Pu. Cornett, MD;  Location: [facility];  Service: General;  Laterality: N/A;  . LITHOTRIPSY    . URETHRAL STRICTURE DILATATION     as a child    There were no vitals filed for this  visit.   Subjective Assessment - 01/02/20 1710    Subjective Patient states improvement with neck pain, suboccipital muscular pain, and overall pain since the previous session.    Pertinent History Patient reports increased neck pain that began four years prior. Patient states she also has a history of head aches which has been impacting her ability to work and function overall. Patient states increased pain with performing head movements, most specifically rotation and performing cervical flexion. Patient states she has been out of work secondary to increased neck pain and head aches and this is the case until the week of June 14th 2021. Patient states she has been using ice with minimal improvement and managing with pain with pain medications where necessary. Increase in pain 4 months prior without moi. Patient states she had recently receive a occipital nerve block which helped minimally with her symptoms. Patient states she would like to decrease her pain overall so she can return to work.    Limitations Lifting    Diagnostic tests MRI, Xray    Patient Stated Goals Decrease pain    Currently in Pain? No/denies    Pain Onset More than a month ago           INTERVENTIONS      Therapeutic Exercise Bow and arrow with GTB - 2x5 Cervical retraction with rotation -  x 15 Scapular retraction with cervical rotation - x15 Cervical Isometric (extension, Left and Right-Side bend) - x10 7sec  Cervical isometric into retraction - x10 7 sec Cervical isometric retraction into towel - 2 x 45sec  Serratus punches with GTB - x 10 Serratus push up plus at wall - x 10    Performed exercises to reduce pain      PT Education - 01/02/20 1739    Education Details form/technique with exercise    Person(s) Educated Patient    Methods Explanation;Demonstration    Comprehension Verbalized understanding;Returned demonstration            PT Short Term Goals - 10/26/19 0839      PT SHORT TERM GOAL #1    Title Patient will be independent with HEP to continue benefits of therapy after discharge.    Baseline Dependent with HEP    Time 2    Period Weeks    Status New    Target Date 11/08/19             PT Long Term Goals - 12/07/19 0820      PT LONG TERM GOAL #1   Title Patinet will be independent with HEP to continue benefits of therapy until after discharge.    Baseline Dependent with HEP; 12/07/2019: mod A for exercise progression    Time 6    Period Weeks    Status On-going      PT LONG TERM GOAL #2   Title Patient will have a significant improvement in her FOTO score to indicate improvement with cervical function and stability.    Baseline FOTO: Assess NV; 10/26/2019: 53; 12/07/2019: 55    Time 6    Period Weeks    Status On-going      PT LONG TERM GOAL #3   Title Patient will have a worst pain of 2/10 to indicate significant improvement with pain and spasms and allow for performance of work related activities    Baseline 8/10; 12/07/2019: 8/10    Time 6    Period Weeks    Status On-going      PT LONG TERM GOAL #4   Title Patient will be able return to occupational tasks without signifcant difficulty and pain to better return to job related duties    Baseline not currently working secondary to pain; 12/07/2019: Increased pain during the day at work    Time 6    Period Weeks    Status On-going                 Plan - 01/03/20 1336    Clinical Impression Statement Performed exercises to address suboccipital muscle weakness, patient does well with this, however, requires cueing for head position indicating decreased motor control along the neck and head. No inrease in pain noted throughout the session. Patient will benefit from further skilled therapy to return to prior level of function.    Personal Factors and Comorbidities Comorbidity 2    Comorbidities Chronic HA    Examination-Activity Limitations Carry;Sit    Examination-Participation Restrictions Sales executive    Stability/Clinical Decision Making Evolving/Moderate complexity    Rehab Potential Good    PT Frequency 2x / week    PT Duration 6 weeks    PT Treatment/Interventions Joint Manipulations;Spinal Manipulations;Dry needling;Passive range of motion;Patient/family education;Manual techniques;Therapeutic exercise;Neuromuscular re-education;Canalith Repostioning;Electrical Stimulation;Moist Heat    PT Next Visit Plan Add in pec stretch for improved shoulder/scapular mobility, progress DNF endurance if needed  PT Home Exercise Plan See education section    Consulted and Agree with Plan of Care Patient           Patient will benefit from skilled therapeutic intervention in order to improve the following deficits and impairments:  Pain, Postural dysfunction, Increased muscle spasms, Increased fascial restricitons, Hypomobility, Decreased strength, Decreased range of motion, Decreased mobility, Decreased coordination, Decreased endurance, Decreased activity tolerance  Visit Diagnosis: Cramp and spasm  Cervicalgia     Problem List Patient Active Problem List   Diagnosis Date Noted  . Nausea 08/21/2019  . Abdominal bloating 08/21/2019  . History of COVID-19 06/24/2019  . URI (upper respiratory infection) 08/26/2018  . Thyroid nodule 02/27/2018  . Stress 01/10/2017  . DUB (dysfunctional uterine bleeding) 07/16/2016  . Low back pain 04/30/2015  . Fatigue 02/08/2015  . Skin lesion of breast 08/06/2014  . Health care maintenance 08/06/2014  . Nephrolithiasis 02/11/2013  . Migraine 02/11/2013  . History of frequent urinary tract infections 02/11/2013  . Syncope 02/11/2013  . IBS (irritable bowel syndrome) 02/11/2013  . Anemia 02/11/2013  . Vitamin D deficiency 02/11/2013    Myrene Galas, PT DPT 01/03/2020, 2:04 PM  [facility] PHYSICAL AND SPORTS MEDICINE [address], [place], [address] Phone: [phone]   Fax:   [phone]  Name: Tina Fleming MRN: [id number] Date of Birth: Sep 25, 1980

## 2020-01-04 ENCOUNTER — Ambulatory Visit: Payer: 59

## 2020-01-04 ENCOUNTER — Other Ambulatory Visit: Payer: Self-pay

## 2020-01-04 DIAGNOSIS — M542 Cervicalgia: Secondary | ICD-10-CM

## 2020-01-04 DIAGNOSIS — R252 Cramp and spasm: Secondary | ICD-10-CM | POA: Diagnosis not present

## 2020-01-04 NOTE — Therapy (Signed)
[facility]YS[facility]ORTS [facility]ICINE [address] [place], [address] Phone: [phone]   Fax:  [phone]  Physical Therapy Treatment  Patient Details  Name: Tina Fleming MRN: [id number] Date of Birth: 06-Feb-1981 Referring Provider (PT): Lorin Picket MD   Encounter Date: 01/04/2020   PT End of Session - 01/04/20 1507    Visit Number 19    Number of Visits 21    Date for PT Re-Evaluation 01/04/20    PT Start Time 1500    PT Stop Time 1545    PT Time Calculation (min) 45 min    Activity Tolerance No increased pain;Patient tolerated treatment well    Behavior During Therapy [facility] for tasks assessed/performed           Past Medical History:  Diagnosis Date  . Abnormal EKG    no cardiology follow  up was needed  . Anemia    iron suppliments in past-not currently  . Anemia   . Heart murmur    asymtomatic  . History of kidney stones   . IBS (irritable bowel syndrome)   . Migraine   . Nephrolithiasis   . Obesity   . PONV (postoperative nausea and vomiting)    prolonged sedation  . Vitamin D deficiency     Past Surgical History:  Procedure Laterality Date  . APPENDECTOMY  03/21/12   laproscopic appy  . DILATATION & CURETTAGE/HYSTEROSCOPY WITH MYOSURE N/A 03/20/2016   Procedure: DILATATION & CURETTAGE/HYSTEROSCOPY WITH MYOSURE;  Surgeon: Maxie Better, MD;  Location: [facility];  Service: Gynecology;  Laterality: N/A;  45 min. requested  . KNEE ARTHROSCOPY  04/24/2011   Procedure: ARTHROSCOPY KNEE;  Surgeon: Javier Docker;  Location: [facility];  Service: Orthopedics;  Laterality: Right;  /Arthroscopy with debridement, right knee  . LAPAROSCOPIC APPENDECTOMY  03/21/2012   Procedure: APPENDECTOMY LAPAROSCOPIC;  Surgeon: Clovis Pu. Cornett, MD;  Location: [facility];  Service: General;  Laterality: N/A;  . LITHOTRIPSY    . URETHRAL STRICTURE DILATATION     as a child    There were no vitals filed for this  visit.   Subjective Assessment - 01/04/20 1505    Subjective Patient states that her neck is doing fine.    Pertinent History Patient reports increased neck pain that began four years prior. Patient states she also has a history of head aches which has been impacting her ability to work and function overall. Patient states increased pain with performing head movements, most specifically rotation and performing cervical flexion. Patient states she has been out of work secondary to increased neck pain and head aches and this is the case until the week of June 14th 2021. Patient states she has been using ice with minimal improvement and managing with pain with pain medications where necessary. Increase in pain 4 months prior without moi. Patient states she had recently receive a occipital nerve block which helped minimally with her symptoms. Patient states she would like to decrease her pain overall so she can return to work.    Limitations Lifting    Diagnostic tests MRI, Xray    Patient Stated Goals Decrease pain    Currently in Pain? No/denies    Pain Onset More than a month ago          INTERVENTIONS    Therapeutic Exercise Bow and arrow with GTB - 2x5 Cervical retraction with rotation - x 15 Scapular retraction with cervical rotation - x15 Cervical Isometric (  extension, Left and Right-Side bend) - x10 7sec  Cervical isometric into retraction - x10 7 sec Serratus punches with GTB - x 10 Serratus push up plus at wall - x 10  Cervical Retraction on OMEGA Machine 5lbs 2x10  OMEGA Machine Rows 20lbs 2x10 while neck retracted  Cervical Retraction with Towel 2x15  Cervical Retraction with Squat to press 5lb DBs x10    Performed exercises to reduce pain    PT Education - 01/04/20 1507    Education Details form/technique with exercise    Person(s) Educated Patient    Methods Explanation;Demonstration    Comprehension Verbalized understanding;Returned demonstration            PT  Short Term Goals - 10/26/19 0839      PT SHORT TERM GOAL #1   Title Patient will be independent with HEP to continue benefits of therapy after discharge.    Baseline Dependent with HEP    Time 2    Period Weeks    Status New    Target Date 11/08/19             PT Long Term Goals - 12/07/19 0820      PT LONG TERM GOAL #1   Title Patinet will be independent with HEP to continue benefits of therapy until after discharge.    Baseline Dependent with HEP; 12/07/2019: mod A for exercise progression    Time 6    Period Weeks    Status On-going      PT LONG TERM GOAL #2   Title Patient will have a significant improvement in her FOTO score to indicate improvement with cervical function and stability.    Baseline FOTO: Assess NV; 10/26/2019: 53; 12/07/2019: 55    Time 6    Period Weeks    Status On-going      PT LONG TERM GOAL #3   Title Patient will have a worst pain of 2/10 to indicate significant improvement with pain and spasms and allow for performance of work related activities    Baseline 8/10; 12/07/2019: 8/10    Time 6    Period Weeks    Status On-going      PT LONG TERM GOAL #4   Title Patient will be able return to occupational tasks without signifcant difficulty and pain to better return to job related duties    Baseline not currently working secondary to pain; 12/07/2019: Increased pain during the day at work    Time 6    Period Weeks    Status On-going                 Plan - 01/04/20 1507    Clinical Impression Statement Continued focusing on exercises to work on patients suboccipital weakness during todays session.  Patient continues to require verbal cueing to maintain cervical retraction during exercises.  Patient had pain on the left side of her neck during cervical retraction with rotation.  Patient's pain dissipated after ending the exercise and performing cervical reaction into towel.   Patient will benefit from skilled therapy to return to prior level of  function and progress towards on going goals.    Personal Factors and Comorbidities Comorbidity 2    Comorbidities Chronic HA    Examination-Activity Limitations Carry;Sit    Examination-Participation Restrictions Yard Work;Other    Stability/Clinical Decision Making Evolving/Moderate complexity    Clinical Decision Making Moderate    Rehab Potential Good    PT Frequency 2x / week  PT Duration 6 weeks    PT Treatment/Interventions Joint Manipulations;Spinal Manipulations;Dry needling;Passive range of motion;Patient/family education;Manual techniques;Therapeutic exercise;Neuromuscular re-education;Canalith Repostioning;Electrical Stimulation;Moist Heat    PT Next Visit Plan Add in pec stretch for improved shoulder/scapular mobility, progress DNF endurance if needed    PT Home Exercise Plan See education section    Consulted and Agree with Plan of Care Patient           Patient will benefit from skilled therapeutic intervention in order to improve the following deficits and impairments:  Pain, Postural dysfunction, Increased muscle spasms, Increased fascial restricitons, Hypomobility, Decreased strength, Decreased range of motion, Decreased mobility, Decreased coordination, Decreased endurance, Decreased activity tolerance  Visit Diagnosis: Cramp and spasm  Cervicalgia     Problem List Patient Active Problem List   Diagnosis Date Noted  . Nausea 08/21/2019  . Abdominal bloating 08/21/2019  . History of COVID-19 06/24/2019  . URI (upper respiratory infection) 08/26/2018  . Thyroid nodule 02/27/2018  . Stress 01/10/2017  . DUB (dysfunctional uterine bleeding) 07/16/2016  . Low back pain 04/30/2015  . Fatigue 02/08/2015  . Skin lesion of breast 08/06/2014  . Health care maintenance 08/06/2014  . Nephrolithiasis 02/11/2013  . Migraine 02/11/2013  . History of frequent urinary tract infections 02/11/2013  . Syncope 02/11/2013  . IBS (irritable bowel syndrome) 02/11/2013  .  Anemia 02/11/2013  . Vitamin D deficiency 02/11/2013   3:57 PM, 01/04/20 Luanna Salk, SPT Student Physical Therapist [place]  [phone]  Luanna Salk 01/04/2020, 3:55 PM  [facility] PHYSICAL AND SPORTS MEDICINE [address], [place], [address] Phone: [phone]   Fax:  [phone]  Name: Tina Fleming MRN: [id number] Date of Birth: 03-24-1981

## 2020-01-10 ENCOUNTER — Ambulatory Visit: Payer: 59

## 2020-01-11 ENCOUNTER — Ambulatory Visit: Payer: 59

## 2020-01-11 ENCOUNTER — Other Ambulatory Visit: Payer: Self-pay

## 2020-01-11 DIAGNOSIS — R252 Cramp and spasm: Secondary | ICD-10-CM

## 2020-01-11 DIAGNOSIS — M542 Cervicalgia: Secondary | ICD-10-CM

## 2020-01-11 NOTE — Therapy (Signed)
[facility] [address], [place], [address] Phone: [phone]   Fax:  [phone]  Physical Therapy Treatment/Progress note  Patient Details  Name: Tina Fleming MRN: [id number] Date of Birth: 1981-05-24 Referring Provider (PT): Lorin Picket MD  Reporting period: 12/08/2019 - 01/11/2020  Encounter Date: 01/11/2020   PT End of Session - 01/11/20 1703    Visit Number 20    Number of Visits 21    Date for PT Re-Evaluation 01/04/20    PT Start Time 1645    PT Stop Time 1730    PT Time Calculation (min) 45 min    Activity Tolerance No increased pain;Patient tolerated treatment well    Behavior During Therapy [facility] for tasks assessed/performed           Past Medical History:  Diagnosis Date  . Abnormal EKG    no cardiology follow  up was needed  . Anemia    iron suppliments in past-not currently  . Anemia   . Heart murmur    asymtomatic  . History of kidney stones   . IBS (irritable bowel syndrome)   . Migraine   . Nephrolithiasis   . Obesity   . PONV (postoperative nausea and vomiting)    prolonged sedation  . Vitamin D deficiency     Past Surgical History:  Procedure Laterality Date  . APPENDECTOMY  03/21/12   laproscopic appy  . DILATATION & CURETTAGE/HYSTEROSCOPY WITH MYOSURE N/A 03/20/2016   Procedure: DILATATION & CURETTAGE/HYSTEROSCOPY WITH MYOSURE;  Surgeon: Maxie Better, MD;  Location: [facility];  Service: Gynecology;  Laterality: N/A;  45 min. requested  . KNEE ARTHROSCOPY  04/24/2011   Procedure: ARTHROSCOPY KNEE;  Surgeon: Javier Docker;  Location: [facility];  Service: Orthopedics;  Laterality: Right;  /Arthroscopy with debridement, right knee  . LAPAROSCOPIC APPENDECTOMY  03/21/2012   Procedure: APPENDECTOMY LAPAROSCOPIC;  Surgeon: Clovis Pu. Cornett, MD;  Location: [facility];  Service: General;  Laterality: N/A;  . LITHOTRIPSY    . URETHRAL STRICTURE DILATATION     as  a child    There were no vitals filed for this visit.   Subjective Assessment - 01/11/20 1653    Subjective Patient states the base of her neck has flared up around C7-T1. Patient states the pain along the base of her neck has not been bothering her as much.    Pertinent History Patient reports increased neck pain that began four years prior. Patient states she also has a history of head aches which has been impacting her ability to work and function overall. Patient states increased pain with performing head movements, most specifically rotation and performing cervical flexion. Patient states she has been out of work secondary to increased neck pain and head aches and this is the case until the week of June 14th 2021. Patient states she has been using ice with minimal improvement and managing with pain with pain medications where necessary. Increase in pain 4 months prior without moi. Patient states she had recently receive a occipital nerve block which helped minimally with her symptoms. Patient states she would like to decrease her pain overall so she can return to work.    Limitations Lifting    Diagnostic tests MRI, Xray    Patient Stated Goals Decrease pain    Currently in Pain? No/denies    Pain Onset More than a month ago           INTERVENTIONS  Therapeutic Exercise SNAG cervical extension in sitting - x 10 B SNAG cervical rotations in sitting - x 10 B Cervical retraction with rotation - x10 B Bow and arrow with GTB - x 15 Scapular retraction with cervical retraction - x15  Performed exercises to improve strength  Manual Therapy  Central and unilateral mobilizations B P-A Grade III to improve mobility to decrease pain with cervical movement 10 x 30sec C7-T3      PT Education - 01/11/20 1702    Education Details form/technique with exercise    Person(s) Educated Patient    Methods Explanation;Demonstration    Comprehension Verbalized understanding;Returned  demonstration            PT Short Term Goals - 10/26/19 0839      PT SHORT TERM GOAL #1   Title Patient will be independent with HEP to continue benefits of therapy after discharge.    Baseline Dependent with HEP    Time 2    Period Weeks    Status New    Target Date 11/08/19             PT Long Term Goals - 01/11/20 1736      PT LONG TERM GOAL #1   Title Patinet will be independent with HEP to continue benefits of therapy until after discharge.    Baseline Dependent with HEP; 12/07/2019: mod A for exercise progression; 01/11/2020: independent    Time 6    Period Weeks    Status Achieved      PT LONG TERM GOAL #2   Title Patient will have a significant improvement in her FOTO score to indicate improvement with cervical function and stability.    Baseline FOTO: Assess NV; 10/26/2019: 53; 12/07/2019: 55; 01/11/2020: 61    Time 6    Period Weeks    Status On-going      PT LONG TERM GOAL #3   Title Patient will have a worst pain of 2/10 to indicate significant improvement with pain and spasms and allow for performance of work related activities    Baseline 8/10; 12/07/2019: 8/10; 01/11/2020: 6/10    Time 6    Period Weeks    Status On-going      PT LONG TERM GOAL #4   Title Patient will be able return to occupational tasks without signifcant difficulty and pain to better return to job related duties    Baseline not currently working secondary to pain; 12/07/2019: Increased pain during the day at work; 01/11/2020: Increased pain with migranes 1x-2x week limiting occupational tasks on onset.    Time 6    Period Weeks    Status On-going                 Plan - 01/11/20 1732    Clinical Impression Statement Patient making progress towards long term goals with improvement in overall pain, total amount of migranes experienced, and FOTO score indicating overall improvement with cervical and neck pain. Although patient is improving, she continues to have increased cervical pain  with migrane x1-x2 /week which limits her ability to perform functional tasks such as computer work and lifting items. Patient is improving overall and will benefit from further skilled therapy to return to prior level of function.    Personal Factors and Comorbidities Comorbidity 2    Comorbidities Chronic HA    Examination-Activity Limitations Carry;Sit    Examination-Participation Restrictions Contractor    Stability/Clinical Decision Making Evolving/Moderate complexity    Rehab Potential  Good    PT Frequency 2x / week    PT Duration 6 weeks    PT Treatment/Interventions Joint Manipulations;Spinal Manipulations;Dry needling;Passive range of motion;Patient/family education;Manual techniques;Therapeutic exercise;Neuromuscular re-education;Canalith Repostioning;Electrical Stimulation;Moist Heat    PT Next Visit Plan Add in pec stretch for improved shoulder/scapular mobility, progress DNF endurance if needed    PT Home Exercise Plan See education section    Consulted and Agree with Plan of Care Patient           Patient will benefit from skilled therapeutic intervention in order to improve the following deficits and impairments:  Pain, Postural dysfunction, Increased muscle spasms, Increased fascial restricitons, Hypomobility, Decreased strength, Decreased range of motion, Decreased mobility, Decreased coordination, Decreased endurance, Decreased activity tolerance  Visit Diagnosis: Cramp and spasm  Cervicalgia     Problem List Patient Active Problem List   Diagnosis Date Noted  . Nausea 08/21/2019  . Abdominal bloating 08/21/2019  . History of COVID-19 06/24/2019  . URI (upper respiratory infection) 08/26/2018  . Thyroid nodule 02/27/2018  . Stress 01/10/2017  . DUB (dysfunctional uterine bleeding) 07/16/2016  . Low back pain 04/30/2015  . Fatigue 02/08/2015  . Skin lesion of breast 08/06/2014  . Health care maintenance 08/06/2014  . Nephrolithiasis 02/11/2013  .  Migraine 02/11/2013  . History of frequent urinary tract infections 02/11/2013  . Syncope 02/11/2013  . IBS (irritable bowel syndrome) 02/11/2013  . Anemia 02/11/2013  . Vitamin D deficiency 02/11/2013    Myrene Galas, PT DPT 01/11/2020, 5:38 PM  [facility] PHYS[facility] AND SPORTS MEDICINE [address] [place], [address] Phone: [phone]   Fax:  [phone]  Name: Tina Fleming MRN: [id number] Date of Birth: 01-Jul-1980

## 2020-01-15 ENCOUNTER — Ambulatory Visit: Payer: 59

## 2020-01-15 DIAGNOSIS — F411 Generalized anxiety disorder: Secondary | ICD-10-CM | POA: Diagnosis not present

## 2020-01-16 DIAGNOSIS — Z03818 Encounter for observation for suspected exposure to other biological agents ruled out: Secondary | ICD-10-CM | POA: Diagnosis not present

## 2020-01-16 DIAGNOSIS — Z20828 Contact with and (suspected) exposure to other viral communicable diseases: Secondary | ICD-10-CM | POA: Diagnosis not present

## 2020-01-16 DIAGNOSIS — Z1152 Encounter for screening for COVID-19: Secondary | ICD-10-CM | POA: Diagnosis not present

## 2020-01-17 ENCOUNTER — Encounter: Payer: Self-pay | Admitting: Internal Medicine

## 2020-01-17 ENCOUNTER — Ambulatory Visit: Payer: 59

## 2020-01-19 NOTE — Telephone Encounter (Signed)
Notify pt that the only documented true contraindication to the vaccine is if you are allergic to the vaccine or any of its ingredients (for example polyethylene glycol).  Given that Dr Sherryll Burger has been following her, I would like for her to call and discuss with him as well.

## 2020-01-22 MED FILL — NURTEC 75 MG TBDP: 75 | 30 days supply | Qty: 8 | Fill #2

## 2020-01-24 ENCOUNTER — Ambulatory Visit: Payer: BC Managed Care – PPO | Attending: Neurology

## 2020-01-24 ENCOUNTER — Other Ambulatory Visit: Payer: Self-pay

## 2020-01-24 DIAGNOSIS — R252 Cramp and spasm: Secondary | ICD-10-CM

## 2020-01-24 DIAGNOSIS — M542 Cervicalgia: Secondary | ICD-10-CM | POA: Insufficient documentation

## 2020-01-24 NOTE — Addendum Note (Signed)
Addended by: Bethanie Dicker on: 01/24/2020 06:52 PM   Modules accepted: Orders

## 2020-01-24 NOTE — Therapy (Signed)
[facility] [address] [place], [address] Phone: [phone]   Fax:  [phone]  Physical Therapy Treatment  Patient Details  Name: Tina Fleming MRN: [id number] Date of Birth: 1981-04-16 Referring Provider (PT): Lorin Picket MD   Encounter Date: 01/24/2020   PT End of Session - 01/24/20 1848    Visit Number 21    Number of Visits 29    Date for PT Re-Evaluation 01/04/20    PT Start Time 1845    PT Stop Time 1930    PT Time Calculation (min) 45 min    Activity Tolerance No increased pain;Patient tolerated treatment well    Behavior During Therapy [facility] for tasks assessed/performed           Past Medical History:  Diagnosis Date  . Abnormal EKG    no cardiology follow  up was needed  . Anemia    iron suppliments in past-not currently  . Anemia   . Heart murmur    asymtomatic  . History of kidney stones   . IBS (irritable bowel syndrome)   . Migraine   . Nephrolithiasis   . Obesity   . PONV (postoperative nausea and vomiting)    prolonged sedation  . Vitamin D deficiency     Past Surgical History:  Procedure Laterality Date  . APPENDECTOMY  03/21/12   laproscopic appy  . DILATATION & CURETTAGE/HYSTEROSCOPY WITH MYOSURE N/A 03/20/2016   Procedure: DILATATION & CURETTAGE/HYSTEROSCOPY WITH MYOSURE;  Surgeon: Maxie Better, MD;  Location: [facility];  Service: Gynecology;  Laterality: N/A;  45 min. requested  . KNEE ARTHROSCOPY  04/24/2011   Procedure: ARTHROSCOPY KNEE;  Surgeon: Javier Docker;  Location: [facility];  Service: Orthopedics;  Laterality: Right;  /Arthroscopy with debridement, right knee  . LAPAROSCOPIC APPENDECTOMY  03/21/2012   Procedure: APPENDECTOMY LAPAROSCOPIC;  Surgeon: Clovis Pu. Cornett, MD;  Location: [facility];  Service: General;  Laterality: N/A;  . LITHOTRIPSY    . URETHRAL STRICTURE DILATATION     as a child    There were no vitals filed for this  visit.   Subjective Assessment - 01/24/20 1844    Subjective Patient states increased pain with sitting at the computer for around 3-4 hours with increased pain along the suboccipital musculature.    Pertinent History Patient reports increased neck pain that began four years prior. Patient states she also has a history of head aches which has been impacting her ability to work and function overall. Patient states increased pain with performing head movements, most specifically rotation and performing cervical flexion. Patient states she has been out of work secondary to increased neck pain and head aches and this is the case until the week of June 14th 2021. Patient states she has been using ice with minimal improvement and managing with pain with pain medications where necessary. Increase in pain 4 months prior without moi. Patient states she had recently receive a occipital nerve block which helped minimally with her symptoms. Patient states she would like to decrease her pain overall so she can return to work.    Limitations Lifting    Diagnostic tests MRI, Xray    Patient Stated Goals Decrease pain    Currently in Pain? No/denies    Pain Onset More than a month ago               INTERVENTIONS     Therapeutic Exercise Cervical Retraction with Towel 2x15  Push up with PLUS at table - 2x 15 Y position lifts with RTB - 2 x 10 Cervical retraction with rotation - x 15 Ball taps on the wall - 2 x 10  Cervical Resisted retraction - x5 10 sec holds 5 # at OMEGA TRX scapular rows - 2 x 15  Bow and arrow with RTB - 2x10     Performed exercises to improve strength        PT Education - 01/24/20 1848    Education Details form/technique with exercise    Person(s) Educated Patient    Methods Explanation;Demonstration    Comprehension Verbalized understanding;Returned demonstration            PT Short Term Goals - 10/26/19 0839      PT SHORT TERM GOAL #1   Title Patient will be  independent with HEP to continue benefits of therapy after discharge.    Baseline Dependent with HEP    Time 2    Period Weeks    Status New    Target Date 11/08/19             PT Long Term Goals - 01/11/20 1736      PT LONG TERM GOAL #1   Title Patinet will be independent with HEP to continue benefits of therapy until after discharge.    Baseline Dependent with HEP; 12/07/2019: mod A for exercise progression; 01/11/2020: independent    Time 6    Period Weeks    Status Achieved      PT LONG TERM GOAL #2   Title Patient will have a significant improvement in her FOTO score to indicate improvement with cervical function and stability.    Baseline FOTO: Assess NV; 10/26/2019: 53; 12/07/2019: 55; 01/11/2020: 61    Time 6    Period Weeks    Status On-going      PT LONG TERM GOAL #3   Title Patient will have a worst pain of 2/10 to indicate significant improvement with pain and spasms and allow for performance of work related activities    Baseline 8/10; 12/07/2019: 8/10; 01/11/2020: 6/10    Time 6    Period Weeks    Status On-going      PT LONG TERM GOAL #4   Title Patient will be able return to occupational tasks without signifcant difficulty and pain to better return to job related duties    Baseline not currently working secondary to pain; 12/07/2019: Increased pain during the day at work; 01/11/2020: Increased pain with migranes 1x-2x week limiting occupational tasks on onset.    Time 6    Period Weeks    Status On-going                 Plan - 01/24/20 1912    Clinical Impression Statement No increased in symptoms today, and focused on improving strength and coordination with suboccipital muscular. Continued to progress retraction strength through resisted motion. Patient will benefit from further skilled therapy to return to prior level of fuction.    Personal Factors and Comorbidities Comorbidity 2    Comorbidities Chronic HA    Examination-Activity Limitations  Carry;Sit    Examination-Participation Restrictions Contractor    Stability/Clinical Decision Making Evolving/Moderate complexity    Rehab Potential Good    PT Frequency 2x / week    PT Duration 6 weeks    PT Treatment/Interventions Joint Manipulations;Spinal Manipulations;Dry needling;Passive range of motion;Patient/family education;Manual techniques;Therapeutic exercise;Neuromuscular re-education;Canalith Repostioning;Electrical Stimulation;Moist Heat    PT  Next Visit Plan Add in pec stretch for improved shoulder/scapular mobility, progress DNF endurance if needed    PT Home Exercise Plan See education section    Consulted and Agree with Plan of Care Patient           Patient will benefit from skilled therapeutic intervention in order to improve the following deficits and impairments:  Pain, Postural dysfunction, Increased muscle spasms, Increased fascial restricitons, Hypomobility, Decreased strength, Decreased range of motion, Decreased mobility, Decreased coordination, Decreased endurance, Decreased activity tolerance  Visit Diagnosis: Cramp and spasm  Cervicalgia     Problem List Patient Active Problem List   Diagnosis Date Noted  . Nausea 08/21/2019  . Abdominal bloating 08/21/2019  . History of COVID-19 06/24/2019  . URI (upper respiratory infection) 08/26/2018  . Thyroid nodule 02/27/2018  . Stress 01/10/2017  . DUB (dysfunctional uterine bleeding) 07/16/2016  . Low back pain 04/30/2015  . Fatigue 02/08/2015  . Skin lesion of breast 08/06/2014  . Health care maintenance 08/06/2014  . Nephrolithiasis 02/11/2013  . Migraine 02/11/2013  . History of frequent urinary tract infections 02/11/2013  . Syncope 02/11/2013  . IBS (irritable bowel syndrome) 02/11/2013  . Anemia 02/11/2013  . Vitamin D deficiency 02/11/2013    Myrene Galas, PT DPT 01/24/2020, 7:19 PM  [facility] PHYSICAL AND SPORTS MEDICINE [address] [place], [address] Phone: [phone]   Fax:  [phone]  Name: SHALAUNDA WEATHERHOLTZ MRN: [id number] Date of Birth: 07/30/1980

## 2020-01-31 ENCOUNTER — Ambulatory Visit: Payer: BC Managed Care – PPO

## 2020-01-31 ENCOUNTER — Other Ambulatory Visit: Payer: Self-pay

## 2020-01-31 DIAGNOSIS — M542 Cervicalgia: Secondary | ICD-10-CM | POA: Diagnosis not present

## 2020-01-31 DIAGNOSIS — R252 Cramp and spasm: Secondary | ICD-10-CM | POA: Diagnosis not present

## 2020-01-31 NOTE — Therapy (Signed)
[facility] MEDICINE [address] [place], [address] Phone: [phone]   Fax:  [phone]  Physical Therapy Treatment  Patient Details  Name: Tina Fleming MRN: [id number] Date of Birth: May 13, 1981 Referring Provider (PT): Lorin Picket MD   Encounter Date: 01/31/2020   PT End of Session - 01/31/20 1847    Visit Number 22    Number of Visits 29    Date for PT Re-Evaluation 01/04/20    PT Start Time 1845    PT Stop Time 1930    PT Time Calculation (min) 45 min    Activity Tolerance No increased pain;Patient tolerated treatment well    Behavior During Therapy [facility] for tasks assessed/performed           Past Medical History:  Diagnosis Date  . Abnormal EKG    no cardiology follow  up was needed  . Anemia    iron suppliments in past-not currently  . Anemia   . Heart murmur    asymtomatic  . History of kidney stones   . IBS (irritable bowel syndrome)   . Migraine   . Nephrolithiasis   . Obesity   . PONV (postoperative nausea and vomiting)    prolonged sedation  . Vitamin D deficiency     Past Surgical History:  Procedure Laterality Date  . APPENDECTOMY  03/21/12   laproscopic appy  . DILATATION & CURETTAGE/HYSTEROSCOPY WITH MYOSURE N/A 03/20/2016   Procedure: DILATATION & CURETTAGE/HYSTEROSCOPY WITH MYOSURE;  Surgeon: Maxie Better, MD;  Location: [facility];  Service: Gynecology;  Laterality: N/A;  45 min. requested  . KNEE ARTHROSCOPY  04/24/2011   Procedure: ARTHROSCOPY KNEE;  Surgeon: Javier Docker;  Location: [facility];  Service: Orthopedics;  Laterality: Right;  /Arthroscopy with debridement, right knee  . LAPAROSCOPIC APPENDECTOMY  03/21/2012   Procedure: APPENDECTOMY LAPAROSCOPIC;  Surgeon: Clovis Pu. Cornett, MD;  Location: [facility];  Service: General;  Laterality: N/A;  . LITHOTRIPSY    . URETHRAL STRICTURE DILATATION     as a child    There were no vitals filed for this  visit.   Subjective Assessment - 01/31/20 1846    Subjective Patient states that she had an increase in neck pain and reports it could be due to sitting down at a computer for an extended period of time.    Pertinent History Patient reports increased neck pain that began four years prior. Patient states she also has a history of head aches which has been impacting her ability to work and function overall. Patient states increased pain with performing head movements, most specifically rotation and performing cervical flexion. Patient states she has been out of work secondary to increased neck pain and head aches and this is the case until the week of June 14th 2021. Patient states she has been using ice with minimal improvement and managing with pain with pain medications where necessary. Increase in pain 4 months prior without moi. Patient states she had recently receive a occipital nerve block which helped minimally with her symptoms. Patient states she would like to decrease her pain overall so she can return to work.    Limitations Lifting    Diagnostic tests MRI, Xray    Patient Stated Goals Decrease pain    Currently in Pain? No/denies    Pain Onset More than a month ago            INTERVENTIONS     Therapeutic Exercise  Cervical Retraction with Towel 2x15  Cervical extension with Towel x20  Push up with PLUS at table - 2x 15 Cervical Retraction with Straight Arm Pull Away RTB  2x15  Cervical SNAG Rotation - x10/side Wall taps 2x20  Cervical Resisted retraction - x5 10 sec holds 5 # at [employer]ME[employer] TRX scapular high rows - 2 x 15  TRX Y Pulls 2x12     Performed exercises to improve strength         PT Education - 01/31/20 1847    Education Details form/technique with exercise    Person(s) Educated Patient    Methods Explanation;Demonstration    Comprehension Verbalized understanding;Returned demonstration            PT Short Term Goals - 10/26/19 0839      PT SHORT TERM  GOAL #1   Title Patient will be independent with HEP to continue benefits of therapy after discharge.    Baseline Dependent with HEP    Time 2    Period Weeks    Status New    Target Date 11/08/19             PT Long Term Goals - 01/11/20 1736      PT LONG TERM GOAL #1   Title Patinet will be independent with HEP to continue benefits of therapy until after discharge.    Baseline Dependent with HEP; 12/07/2019: mod A for exercise progression; 01/11/2020: independent    Time 6    Period Weeks    Status Achieved      PT LONG TERM GOAL #2   Title Patient will have a significant improvement in her FOTO score to indicate improvement with cervical function and stability.    Baseline FOTO: Assess NV; 10/26/2019: 53; 12/07/2019: 55; 01/11/2020: 61    Time 6    Period Weeks    Status On-going      PT LONG TERM GOAL #3   Title Patient will have a worst pain of 2/10 to indicate significant improvement with pain and spasms and allow for performance of work related activities    Baseline 8/10; 12/07/2019: 8/10; 01/11/2020: 6/10    Time 6    Period Weeks    Status On-going      PT LONG TERM GOAL #4   Title Patient will be able return to occupational tasks without signifcant difficulty and pain to better return to job related duties    Baseline not currently working secondary to pain; 12/07/2019: Increased pain during the day at work; 01/11/2020: Increased pain with migranes 1x-2x week limiting occupational tasks on onset.    Time 6    Period Weeks    Status On-going                 Plan - 01/31/20 1854    Clinical Impression Statement Patient was able to tolerate todays exercises without an increase in symptoms.  Continued to work on strengthening patients neck extensor muscular by adding in resisted exercises. Patient continues to have a pulling sensation in sub-occipitals with rotation with an increase in the sensation with OP. Patient will continue to benefit from skilled therapy to  return to prior level of function.    Personal Factors and Comorbidities Comorbidity 2    Comorbidities Chronic HA    Examination-Activity Limitations Carry;Sit    Examination-Participation Restrictions Yard Work;Other    Stability/Clinical Decision Making Evolving/Moderate complexity    Clinical Decision Making Moderate    Rehab Potential Good  PT Frequency 2x / week    PT Duration 6 weeks    PT Treatment/Interventions Joint Manipulations;Spinal Manipulations;Dry needling;Passive range of motion;Patient/family education;Manual techniques;Therapeutic exercise;Neuromuscular re-education;Canalith Repostioning;Electrical Stimulation;Moist Heat    PT Next Visit Plan Add in pec stretch for improved shoulder/scapular mobility, progress DNF endurance if needed    PT Home Exercise Plan See education section    Consulted and Agree with Plan of Care Patient           Patient will benefit from skilled therapeutic intervention in order to improve the following deficits and impairments:  Pain, Postural dysfunction, Increased muscle spasms, Increased fascial restricitons, Hypomobility, Decreased strength, Decreased range of motion, Decreased mobility, Decreased coordination, Decreased endurance, Decreased activity tolerance  Visit Diagnosis: Cramp and spasm  Cervicalgia     Problem List Patient Active Problem List   Diagnosis Date Noted  . Nausea 08/21/2019  . Abdominal bloating 08/21/2019  . History of COVID-19 06/24/2019  . URI (upper respiratory infection) 08/26/2018  . Thyroid nodule 02/27/2018  . Stress 01/10/2017  . DUB (dysfunctional uterine bleeding) 07/16/2016  . Low back pain 04/30/2015  . Fatigue 02/08/2015  . Skin lesion of breast 08/06/2014  . Health care maintenance 08/06/2014  . Nephrolithiasis 02/11/2013  . Migraine 02/11/2013  . History of frequent urinary tract infections 02/11/2013  . Syncope 02/11/2013  . IBS (irritable bowel syndrome) 02/11/2013  . Anemia  02/11/2013  . Vitamin D deficiency 02/11/2013   7:29 PM, 01/31/20 Luanna Salk, SPT Student Physical Therapist [place]  [phone]  Luanna Salk 01/31/2020, 7:12 PM   [facility] PHYSICAL AND SPORTS MEDICINE [address] [place], [address] Phone: [phone]   Fax:  [phone]  Name: TERSEA AULDS MRN: [id number] Date of Birth: [birth year]

## 2020-02-12 ENCOUNTER — Ambulatory Visit: Payer: BC Managed Care – PPO

## 2020-02-12 ENCOUNTER — Other Ambulatory Visit: Payer: Self-pay

## 2020-02-12 DIAGNOSIS — R252 Cramp and spasm: Secondary | ICD-10-CM

## 2020-02-12 DIAGNOSIS — M542 Cervicalgia: Secondary | ICD-10-CM | POA: Diagnosis not present

## 2020-02-12 NOTE — Therapy (Signed)
[facility]ORTS MEDICINE [address] [place], [address] Phone: [phone]   Fax:  [phone]  Physical Therapy Treatment  Patient Details  Name: Tina Fleming MRN: [id number] Date of Birth: [birth year] Referring Provider (PT): Lorin Picket MD   Encounter Date: 02/12/2020   PT End of Session - 02/12/20 1821    Visit Number 23    Number of Visits 29    Date for PT Re-Evaluation 01/04/20    PT Start Time 1815    PT Stop Time 1900    PT Time Calculation (min) 45 min    Activity Tolerance No increased pain;Patient tolerated treatment well    Behavior During Therapy [facility] for tasks assessed/performed           Past Medical History:  Diagnosis Date   Abnormal EKG    no cardiology follow  up was needed   Anemia    iron suppliments in past-not currently   Anemia    Heart murmur    asymtomatic   History of kidney stones    IBS (irritable bowel syndrome)    Migraine    Nephrolithiasis    Obesity    PONV (postoperative nausea and vomiting)    prolonged sedation   Vitamin D deficiency     Past Surgical History:  Procedure Laterality Date   APPENDECTOMY  03/21/12   laproscopic appy   DILATATION & CURETTAGE/HYSTEROSCOPY WITH MYOSURE N/A 03/20/2016   Procedure: DILATATION & CURETTAGE/HYSTEROSCOPY WITH MYOSURE;  Surgeon: Maxie Better, MD;  Location: [facility];  Service: Gynecology;  Laterality: N/A;  45 min. requested   KNEE ARTHROSCOPY  04/24/2011   Procedure: ARTHROSCOPY KNEE;  Surgeon: Javier Docker;  Location: [facility];  Service: Orthopedics;  Laterality: Right;  /Arthroscopy with debridement, right knee   LAPAROSCOPIC APPENDECTOMY  03/21/2012   Procedure: APPENDECTOMY LAPAROSCOPIC;  Surgeon: Clovis Pu. Cornett, MD;  Location: [facility];  Service: General;  Laterality: N/A;   LITHOTRIPSY     URETHRAL STRICTURE DILATATION     as a child    There were no vitals filed for this  visit.   Subjective Assessment - 02/12/20 1817    Subjective Patient reports that she continues to have pain is her neck near suboccipitals and relates it to sitting down too much.    Pertinent History Patient reports increased neck pain that began four years prior. Patient states she also has a history of head aches which has been impacting her ability to work and function overall. Patient states increased pain with performing head movements, most specifically rotation and performing cervical flexion. Patient states she has been out of work secondary to increased neck pain and head aches and this is the case until the week of June 14th 2021. Patient states she has been using ice with minimal improvement and managing with pain with pain medications where necessary. Increase in pain 4 months prior without moi. Patient states she had recently receive a occipital nerve block which helped minimally with her symptoms. Patient states she would like to decrease her pain overall so she can return to work.    Limitations Lifting    Diagnostic tests MRI, Xray    Patient Stated Goals Decrease pain    Currently in Pain? Yes    Pain Score 5     Pain Location Neck    Pain Onset More than a month ago          INTERVENTIONS  Manual Therapy  Dry Needling 0.30x19mm to left cervical extensors (C3)   Therapeutic Exercise Cervical Retraction with Towel 2x15  Cervical Retraction with Lateral Flexion x12 B Prone Cervical Retraction with Extension x15  Prone Cervical Retraction with Rotation x10 B   Cervical Retraction with Row GTB  2x15  Push up with PLUS at table - 2 x 15 Lat Row at [employer] 2x12 25#    Performed exercises to improve strength and decrease pain       PT Education - 02/12/20 1821    Education Details form/technique with exercise    Person(s) Educated Patient    Methods Explanation;Demonstration    Comprehension Verbalized understanding;Returned demonstration            PT  Short Term Goals - 10/26/19 0839      PT SHORT TERM GOAL #1   Title Patient will be independent with HEP to continue benefits of therapy after discharge.    Baseline Dependent with HEP    Time 2    Period Weeks    Status New    Target Date 11/08/19             PT Long Term Goals - 01/11/20 1736      PT LONG TERM GOAL #1   Title Patinet will be independent with HEP to continue benefits of therapy until after discharge.    Baseline Dependent with HEP; 12/07/2019: mod A for exercise progression; 01/11/2020: independent    Time 6    Period Weeks    Status Achieved      PT LONG TERM GOAL #2   Title Patient will have a significant improvement in her FOTO score to indicate improvement with cervical function and stability.    Baseline FOTO: Assess NV; 10/26/2019: 53; 12/07/2019: 55; 01/11/2020: 61    Time 6    Period Weeks    Status On-going      PT LONG TERM GOAL #3   Title Patient will have a worst pain of 2/10 to indicate significant improvement with pain and spasms and allow for performance of work related activities    Baseline 8/10; 12/07/2019: 8/10; 01/11/2020: 6/10    Time 6    Period Weeks    Status On-going      PT LONG TERM GOAL #4   Title Patient will be able return to occupational tasks without signifcant difficulty and pain to better return to job related duties    Baseline not currently working secondary to pain; 12/07/2019: Increased pain during the day at work; 01/11/2020: Increased pain with migranes 1x-2x week limiting occupational tasks on onset.    Time 6    Period Weeks    Status On-going                 Plan - 02/12/20 1822    Clinical Impression Statement DN was performed at beginning of session to help decrease patients pain which was able to produce an in session decrease of pain. Continued to focus on improving patients ROM and extensor strength.  Patient tolerated exercises well with slight discomfort when performing lateral flexion and rotational  exercises.  Patient will continue to benefit from skilled therapy to return to prior level of function.    Personal Factors and Comorbidities Comorbidity 2    Comorbidities Chronic HA    Examination-Activity Limitations Carry;Sit    Examination-Participation Restrictions Yard Work;Other    Stability/Clinical Decision Making Evolving/Moderate complexity    Clinical Decision Making Moderate  Rehab Potential Good    PT Frequency 2x / week    PT Duration 6 weeks    PT Treatment/Interventions Joint Manipulations;Spinal Manipulations;Dry needling;Passive range of motion;Patient/family education;Manual techniques;Therapeutic exercise;Neuromuscular re-education;Canalith Repostioning;Electrical Stimulation;Moist Heat    PT Next Visit Plan Add in pec stretch for improved shoulder/scapular mobility, progress DNF endurance if needed    PT Home Exercise Plan See education section    Consulted and Agree with Plan of Care Patient           Patient will benefit from skilled therapeutic intervention in order to improve the following deficits and impairments:  Pain, Postural dysfunction, Increased muscle spasms, Increased fascial restricitons, Hypomobility, Decreased strength, Decreased range of motion, Decreased mobility, Decreased coordination, Decreased endurance, Decreased activity tolerance  Visit Diagnosis: Cramp and spasm  Cervicalgia     Problem List Patient Active Problem List   Diagnosis Date Noted   Nausea 08/21/2019   Abdominal bloating 08/21/2019   History of COVID-19 06/24/2019   URI (upper respiratory infection) 08/26/2018   Thyroid nodule 02/27/2018   Stress 01/10/2017   DUB (dysfunctional uterine bleeding) 07/16/2016   Low back pain 04/30/2015   Fatigue 02/08/2015   Skin lesion of breast 08/06/2014   Health care maintenance 08/06/2014   Nephrolithiasis 02/11/2013   Migraine 02/11/2013   History of frequent urinary tract infections 02/11/2013   Syncope  02/11/2013   IBS (irritable bowel syndrome) 02/11/2013   Anemia 02/11/2013   Vitamin D deficiency 02/11/2013   7:00 PM, 02/12/20 Luanna Salk, SPT Student Physical Therapist [place]  [phone]  Luanna Salk 02/12/2020, 6:47 PM  [place] PH[facility]ORTS MEDICINE [address] [place], [address] Phone: [phone]   Fax:  [phone]  Name: Tina Fleming MRN: [id number] Date of Birth: 11-14-1980

## 2020-02-21 ENCOUNTER — Other Ambulatory Visit: Payer: Self-pay

## 2020-02-21 ENCOUNTER — Ambulatory Visit: Payer: BC Managed Care – PPO

## 2020-02-21 DIAGNOSIS — M542 Cervicalgia: Secondary | ICD-10-CM | POA: Diagnosis not present

## 2020-02-21 DIAGNOSIS — R252 Cramp and spasm: Secondary | ICD-10-CM | POA: Diagnosis not present

## 2020-02-22 NOTE — Therapy (Signed)
[place] [facility] PHYSICAL [facility] SPORTS MEDICINE [address] [place], [address] Phone: [phone]   Fax:  [phone]  Physical Therapy Treatment  Patient Details  Name: Tina Fleming MRN: [id number] Date of Birth: 1981-02-22 Referring Provider (PT): Lorin Picket MD   Encounter Date: 02/21/2020   PT End of Session - 02/21/20 1859    Visit Number 24    Number of Visits 29    Date for PT Re-Evaluation 02/15/20    PT Start Time 1845    PT Stop Time 1930    PT Time Calculation (min) 45 min    Activity Tolerance No increased pain;Patient tolerated treatment well    Behavior During Therapy [facility] for tasks assessed/performed           Past Medical History:  Diagnosis Date  . Abnormal EKG    no cardiology follow  up was needed  . Anemia    iron suppliments in past-not currently  . Anemia   . Heart murmur    asymtomatic  . History of kidney stones   . IBS (irritable bowel syndrome)   . Migraine   . Nephrolithiasis   . Obesity   . PONV (postoperative nausea and vomiting)    prolonged sedation  . Vitamin D deficiency     Past Surgical History:  Procedure Laterality Date  . APPENDECTOMY  03/21/12   laproscopic appy  . DILATATION & CURETTAGE/HYSTEROSCOPY WITH MYOSURE N/A 03/20/2016   Procedure: DILATATION & CURETTAGE/HYSTEROSCOPY WITH MYOSURE;  Surgeon: Maxie Better, MD;  Location: [facility];  Service: Gynecology;  Laterality: N/A;  45 min. requested  . KNEE ARTHROSCOPY  04/24/2011   Procedure: ARTHROSCOPY KNEE;  Surgeon: Javier Docker;  Location: [facility];  Service: Orthopedics;  Laterality: Right;  /Arthroscopy with debridement, right knee  . LAPAROSCOPIC APPENDECTOMY  03/21/2012   Procedure: APPENDECTOMY LAPAROSCOPIC;  Surgeon: Clovis Pu. Cornett, MD;  Location: [facility];  Service: General;  Laterality: N/A;  . LITHOTRIPSY    . URETHRAL STRICTURE DILATATION     as a child    There were no vitals filed for this  visit.   Subjective Assessment - 02/21/20 1848    Subjective Patient reports increased pain on Monday and Tuesday. Patient states the pain around midday on Monday on most days, feels fine otherwise.    Pertinent History Patient reports increased neck pain that began four years prior. Patient states she also has a history of head aches which has been impacting her ability to work and function overall. Patient states increased pain with performing head movements, most specifically rotation and performing cervical flexion. Patient states she has been out of work secondary to increased neck pain and head aches and this is the case until the week of June 14th 2021. Patient states she has been using ice with minimal improvement and managing with pain with pain medications where necessary. Increase in pain 4 months prior without moi. Patient states she had recently receive a occipital nerve block which helped minimally with her symptoms. Patient states she would like to decrease her pain overall so she can return to work.    Limitations Lifting    Diagnostic tests MRI, Xray    Patient Stated Goals Decrease pain    Currently in Pain? No/denies    Pain Onset More than a month ago              INTERVENTIONS       Therapeutic Exercise Cervical Retraction  with Towel x15 5 sec  Push up with PLUS at table -  x 15 Cervical rotation into towel - x 15 5 sec  Scapular row shoulder row - x15 GTB Bow and arrow with GTB - x15 Shoulder extension in standing - x 15 Lat Row at with GTB - x 15  Shoulder horizontal abduction at GTB - x 15    Performed exercises to improve strength and decrease pain      PT Education - 02/21/20 1855    Education Details form/technique with exercise    Person(s) Educated Patient    Methods Explanation;Demonstration    Comprehension Verbalized understanding;Returned demonstration            PT Short Term Goals - 10/26/19 0839      PT SHORT TERM GOAL #1   Title  Patient will be independent with HEP to continue benefits of therapy after discharge.    Baseline Dependent with HEP    Time 2    Period Weeks    Status New    Target Date 11/08/19             PT Long Term Goals - 02/22/20 0840      PT LONG TERM GOAL #1   Title Patinet will be independent with HEP to continue benefits of therapy until after discharge.    Baseline Dependent with HEP; 12/07/2019: mod A for exercise progression; 01/11/2020: independent    Time 6    Period Weeks    Status Achieved      PT LONG TERM GOAL #2   Title Patient will have a significant improvement in her FOTO score to indicate improvement with cervical function and stability.    Baseline FOTO: Assess NV; 10/26/2019: 53; 12/07/2019: 55; 01/11/2020: 61; 02/21/2020:65    Time 6    Period Weeks    Status Achieved      PT LONG TERM GOAL #3   Title Patient will have a worst pain of 2/10 to indicate significant improvement with pain and spasms and allow for performance of work related activities    Baseline 8/10; 12/07/2019: 8/10; 01/11/2020: 6/10; 02/22/2020: 5/10    Time 6    Period Weeks    Status On-going      PT LONG TERM GOAL #4   Title Patient will be able return to occupational tasks without signifcant difficulty and pain to better return to job related duties    Baseline not currently working secondary to pain; 12/07/2019: Increased pain during the day at work; 01/11/2020: Increased pain with migranes 1x-2x week limiting occupational tasks on onset.; 02/22/2020: 1 x week (on mondays)    Time 6    Period Weeks    Status On-going                 Plan - 02/21/20 1906    Clinical Impression Statement Patient is making progress towards long term goals with improvement in FOTO score, improvement with worst pain scores and less total painful days. 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 Patient will benefit from further  skilled therapy focused on improving limitations to return to prior level of function.    Personal Factors and Comorbidities Comorbidity 2    Comorbidities Chronic HA    Examination-Activity Limitations Carry;Sit    Examination-Participation Restrictions Contractor    Stability/Clinical Decision Making Evolving/Moderate complexity    Rehab Potential Good    PT Frequency 2x /  week    PT Duration 6 weeks    PT Treatment/Interventions Joint Manipulations;Spinal Manipulations;Dry needling;Passive range of motion;Patient/family education;Manual techniques;Therapeutic exercise;Neuromuscular re-education;Canalith Repostioning;Electrical Stimulation;Moist Heat    PT Next Visit Plan Add in pec stretch for improved shoulder/scapular mobility, progress DNF endurance if needed    PT Home Exercise Plan See education section    Consulted and Agree with Plan of Care Patient           Patient will benefit from skilled therapeutic intervention in order to improve the following deficits and impairments:  Pain, Postural dysfunction, Increased muscle spasms, Increased fascial restricitons, Hypomobility, Decreased strength, Decreased range of motion, Decreased mobility, Decreased coordination, Decreased endurance, Decreased activity tolerance  Visit Diagnosis: Cramp and spasm  Cervicalgia     Problem List Patient Active Problem List   Diagnosis Date Noted  . Nausea 08/21/2019  . Abdominal bloating 08/21/2019  . History of COVID-19 06/24/2019  . URI (upper respiratory infection) 08/26/2018  . Thyroid nodule 02/27/2018  . Stress 01/10/2017  . DUB (dysfunctional uterine bleeding) 07/16/2016  . Low back pain 04/30/2015  . Fatigue 02/08/2015  . Skin lesion of breast 08/06/2014  . Health care maintenance 08/06/2014  . Nephrolithiasis 02/11/2013  . Migraine 02/11/2013  . History of frequent urinary tract infections 02/11/2013  . Syncope 02/11/2013  . IBS (irritable bowel syndrome) 02/11/2013   . Anemia 02/11/2013  . Vitamin D deficiency 02/11/2013    Myrene Galas, PT DPT 02/22/2020, 8:53 AM   [place] [facility] PHYSICAL AND SPORTS MEDICINE [address] [place], [address] Phone: [phone]   Fax:  [phone]  Name: JAKAYLA SCHWEPPE MRN: [id number] Date of Birth: 1980-06-22

## 2020-02-28 ENCOUNTER — Other Ambulatory Visit: Payer: Self-pay

## 2020-02-28 ENCOUNTER — Ambulatory Visit: Payer: BC Managed Care – PPO | Attending: Neurology

## 2020-02-28 DIAGNOSIS — M542 Cervicalgia: Secondary | ICD-10-CM | POA: Diagnosis not present

## 2020-02-28 DIAGNOSIS — R252 Cramp and spasm: Secondary | ICD-10-CM | POA: Diagnosis not present

## 2020-02-28 NOTE — Therapy (Signed)
[facility] PH[facility]ORTS MEDICINE [address], [place], [address] Phone: [phone]   Fax:  [phone]  Physical Therapy Treatment  Patient Details  Name: Tina Fleming MRN: [id number] Date of Birth: 09-Jun-1980 Referring Provider (PT): Lorin Picket MD   Encounter Date: 02/28/2020   PT End of Session - 02/28/20 1817    Visit Number 25    Number of Visits 29    Date for PT Re-Evaluation 02/15/20    PT Start Time 1815    PT Stop Time 1900    PT Time Calculation (min) 45 min    Activity Tolerance No increased pain;Patient tolerated treatment well    Behavior During Therapy [facility] for tasks assessed/performed           Past Medical History:  Diagnosis Date   Abnormal EKG    no cardiology follow  up was needed   Anemia    iron suppliments in past-not currently   Anemia    Heart murmur    asymtomatic   History of kidney stones    IBS (irritable bowel syndrome)    Migraine    Nephrolithiasis    Obesity    PONV (postoperative nausea and vomiting)    prolonged sedation   Vitamin D deficiency     Past Surgical History:  Procedure Laterality Date   APPENDECTOMY  03/21/12   laproscopic appy   DILATATION & CURETTAGE/HYSTEROSCOPY WITH MYOSURE N/A 03/20/2016   Procedure: DILATATION & CURETTAGE/HYSTEROSCOPY WITH MYOSURE;  Surgeon: Maxie Better, MD;  Location: [facility];  Service: Gynecology;  Laterality: N/A;  45 min. requested   KNEE ARTHROSCOPY  04/24/2011   Procedure: ARTHROSCOPY KNEE;  Surgeon: Javier Docker;  Location: [facility];  Service: Orthopedics;  Laterality: Right;  /Arthroscopy with debridement, right knee   LAPAROSCOPIC APPENDECTOMY  03/21/2012   Procedure: APPENDECTOMY LAPAROSCOPIC;  Surgeon: Clovis Pu. Cornett, MD;  Location: [facility];  Service: General;  Laterality: N/A;   LITHOTRIPSY     URETHRAL STRICTURE DILATATION     as a child    There were no vitals filed for this  visit.   Subjective Assessment - 02/28/20 1816    Subjective Patient reports she had pain yesterday but it is feeling good at todays session.    Pertinent History Patient reports increased neck pain that began four years prior. Patient states she also has a history of head aches which has been impacting her ability to work and function overall. Patient states increased pain with performing head movements, most specifically rotation and performing cervical flexion. Patient states she has been out of work secondary to increased neck pain and head aches and this is the case until the week of June 14th 2021. Patient states she has been using ice with minimal improvement and managing with pain with pain medications where necessary. Increase in pain 4 months prior without moi. Patient states she had recently receive a occipital nerve block which helped minimally with her symptoms. Patient states she would like to decrease her pain overall so she can return to work.    Limitations Lifting    Diagnostic tests MRI, Xray    Patient Stated Goals Decrease pain    Currently in Pain? No/denies    Pain Onset More than a month ago          INTERVENTIONS       Therapeutic Exercise Cervical Retraction with Towel 2x10 5sec  Push up with PLUS at table -  x15 Cervical rotation into towel B - x 15 5 sec  Rows at [facility] 2 x15 25# Bow and arrow with GTB - 2x15 Straight Arm Pulldown at [place]MEGA 2x15 10# SA Punch on Mat Table BTB - x 15  Straight Arm Pull Apart 2x15 BTB  Shoulder flexion with BTB x15     Performed exercises to improve strength and decrease pain       PT Education - 02/28/20 1817    Education Details Form/technique with exercise    Person(s) Educated Patient    Methods Explanation;Demonstration    Comprehension Verbalized understanding;Returned demonstration            PT Short Term Goals - 10/26/19 0839      PT SHORT TERM GOAL #1   Title Patient will be independent with HEP to  continue benefits of therapy after discharge.    Baseline Dependent with HEP    Time 2    Period Weeks    Status New    Target Date 11/08/19             PT Long Term Goals - 02/22/20 0840      PT LONG TERM GOAL #1   Title Patinet will be independent with HEP to continue benefits of therapy until after discharge.    Baseline Dependent with HEP; 12/07/2019: mod A for exercise progression; 01/11/2020: independent    Time 6    Period Weeks    Status Achieved      PT LONG TERM GOAL #2   Title Patient will have a significant improvement in her FOTO score to indicate improvement with cervical function and stability.    Baseline FOTO: Assess NV; 10/26/2019: 53; 12/07/2019: 55; 01/11/2020: 61; 02/21/2020:65    Time 6    Period Weeks    Status Achieved      PT LONG TERM GOAL #3   Title Patient will have a worst pain of 2/10 to indicate significant improvement with pain and spasms and allow for performance of work related activities    Baseline 8/10; 12/07/2019: 8/10; 01/11/2020: 6/10; 02/22/2020: 5/10    Time 6    Period Weeks    Status On-going      PT LONG TERM GOAL #4   Title Patient will be able return to occupational tasks without signifcant difficulty and pain to better return to job related duties    Baseline not currently working secondary to pain; 12/07/2019: Increased pain during the day at work; 01/11/2020: Increased pain with migranes 1x-2x week limiting occupational tasks on onset.; 02/22/2020: 1 x week (on mondays)    Time 6    Period Weeks    Status On-going                 Plan - 02/28/20 1818    Clinical Impression Statement Continued to focus on improving patients cervical strength/endurance to help decrease pain that occurs 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 Patient tolerated session well with mild fatigue at end of session.  Patient will continue to benefit from skilled therapy to address pain and return to prior level of function.     Personal Factors and Comorbidities Comorbidity 2    Comorbidities Chronic HA    Examination-Activity Limitations Carry;Sit    Examination-Participation Restrictions Yard Work;Other    Stability/Clinical Decision Making Evolving/Moderate complexity    Clinical Decision Making Moderate    Rehab Potential Good    PT Frequency 2x / week  PT Duration 6 weeks    PT Treatment/Interventions Joint Manipulations;Spinal Manipulations;Dry needling;Passive range of motion;Patient/family education;Manual techniques;Therapeutic exercise;Neuromuscular re-education;Canalith Repostioning;Electrical Stimulation;Moist Heat    PT Next Visit Plan Add in pec stretch for improved shoulder/scapular mobility, progress DNF endurance if needed    PT Home Exercise Plan See education section    Consulted and Agree with Plan of Care Patient           Patient will benefit from skilled therapeutic intervention in order to improve the following deficits and impairments:  Pain, Postural dysfunction, Increased muscle spasms, Increased fascial restricitons, Hypomobility, Decreased strength, Decreased range of motion, Decreased mobility, Decreased coordination, Decreased endurance, Decreased activity tolerance  Visit Diagnosis: Cramp and spasm  Cervicalgia     Problem List Patient Active Problem List   Diagnosis Date Noted   Nausea 08/21/2019   Abdominal bloating 08/21/2019   History of COVID-19 06/24/2019   URI (upper respiratory infection) 08/26/2018   Thyroid nodule 02/27/2018   Stress 01/10/2017   DUB (dysfunctional uterine bleeding) 07/16/2016   Low back pain 04/30/2015   Fatigue 02/08/2015   Skin lesion of breast 08/06/2014   Health care maintenance 08/06/2014   Nephrolithiasis 02/11/2013   Migraine 02/11/2013   History of frequent urinary tract infections 02/11/2013   Syncope 02/11/2013   IBS (irritable bowel syndrome) 02/11/2013   Anemia 02/11/2013   Vitamin D deficiency  02/11/2013   6:58 PM, 02/28/20 Luanna Salk, SPT Student Physical Therapist [place]  [phone]  Luanna Salk 02/28/2020, 6:48 PM  [place] [facility] PHYSICAL [facility] SPORTS MEDICINE [address], [place], [address] Phone: [phone]   Fax:  [phone]  Name: Tina Fleming MRN: [id number] Date of Birth: [birth year]

## 2020-03-06 ENCOUNTER — Ambulatory Visit: Payer: BC Managed Care – PPO

## 2020-03-06 ENCOUNTER — Other Ambulatory Visit: Payer: Self-pay

## 2020-03-06 DIAGNOSIS — R252 Cramp and spasm: Secondary | ICD-10-CM

## 2020-03-06 DIAGNOSIS — M542 Cervicalgia: Secondary | ICD-10-CM | POA: Diagnosis not present

## 2020-03-06 NOTE — Therapy (Signed)
[facility] PHYSICAL AND SPORTS MEDICINE [address] [place], [address] Phone: [phone]   Fax:  [phone]  Physical Therapy Treatment  Patient Details  Name: Tina Fleming MRN: [id number] Date of Birth: 01-09-1981 Referring Provider (PT): Lorin Picket MD   Encounter Date: 03/06/2020   PT End of Session - 03/06/20 1709    Visit Number 26    Number of Visits 29    Date for PT Re-Evaluation 02/15/20    PT Start Time 1705    PT Stop Time 1750    PT Time Calculation (min) 45 min    Activity Tolerance No increased pain;Patient tolerated treatment well    Behavior During Therapy [facility] for tasks assessed/performed           Past Medical History:  Diagnosis Date  . Abnormal EKG    no cardiology follow  up was needed  . Anemia    iron suppliments in past-not currently  . Anemia   . Heart murmur    asymtomatic  . History of kidney stones   . IBS (irritable bowel syndrome)   . Migraine   . Nephrolithiasis   . Obesity   . PONV (postoperative nausea and vomiting)    prolonged sedation  . Vitamin D deficiency     Past Surgical History:  Procedure Laterality Date  . APPENDECTOMY  03/21/12   laproscopic appy  . DILATATION & CURETTAGE/HYSTEROSCOPY WITH MYOSURE N/A 03/20/2016   Procedure: DILATATION & CURETTAGE/HYSTEROSCOPY WITH MYOSURE;  Surgeon: Maxie Better, MD;  Location: [facility];  Service: Gynecology;  Laterality: N/A;  45 min. requested  . KNEE ARTHROSCOPY  04/24/2011   Procedure: ARTHROSCOPY KNEE;  Surgeon: Javier Docker;  Location: [facility];  Service: Orthopedics;  Laterality: Right;  /Arthroscopy with debridement, right knee  . LAPAROSCOPIC APPENDECTOMY  03/21/2012   Procedure: APPENDECTOMY LAPAROSCOPIC;  Surgeon: Clovis Pu. Cornett, MD;  Location: [facility];  Service: General;  Laterality: N/A;  . LITHOTRIPSY    . URETHRAL STRICTURE DILATATION     as a child    There were no vitals filed for this  visit.   Subjective Assessment - 03/06/20 1708    Subjective Patient reports she had neck pain yesterday after sitting at work for most of the day.    Pertinent History Patient reports increased neck pain that began four years prior. Patient states she also has a history of head aches which has been impacting her ability to work and function overall. Patient states increased pain with performing head movements, most specifically rotation and performing cervical flexion. Patient states she has been out of work secondary to increased neck pain and head aches and this is the case until the week of June 14th 2021. Patient states she has been using ice with minimal improvement and managing with pain with pain medications where necessary. Increase in pain 4 months prior without moi. Patient states she had recently receive a occipital nerve block which helped minimally with her symptoms. Patient states she would like to decrease her pain overall so she can return to work.    Limitations Lifting    Diagnostic tests MRI, Xray    Patient Stated Goals Decrease pain    Currently in Pain? No/denies    Pain Onset More than a month ago           INTERVENTIONS       Therapeutic Exercise Cervical Retraction with Towel 2x10 5sec  Shoulder Flexion 2x15 5#DBs  Shoulder Abduction 2x15 5#DBs Push up with PLUS at table - x15 Straight Arm Pull Apart 2x15 BTB  Standing SA Punch BTB - 2 x 15  TRX Shoulder High Row 2x10 Lat Pulldown Close Grip 2x10 25# Rows at [facility] 2 x15 25# Single Arm Row x15B 15#     Performed exercises to improve strength and decrease pain       PT Education - 03/06/20 1709    Education Details Form/Technique with Exercise    Person(s) Educated Patient    Methods Explanation;Demonstration    Comprehension Verbalized understanding;Returned demonstration            PT Short Term Goals - 10/26/19 0839      PT SHORT TERM GOAL #1   Title Patient will be independent with HEP to  continue benefits of therapy after discharge.    Baseline Dependent with HEP    Time 2    Period Weeks    Status New    Target Date 11/08/19             PT Long Term Goals - 02/22/20 0840      PT LONG TERM GOAL #1   Title Patinet will be independent with HEP to continue benefits of therapy until after discharge.    Baseline Dependent with HEP; 12/07/2019: mod A for exercise progression; 01/11/2020: independent    Time 6    Period Weeks    Status Achieved      PT LONG TERM GOAL #2   Title Patient will have a significant improvement in her FOTO score to indicate improvement with cervical function and stability.    Baseline FOTO: Assess NV; 10/26/2019: 53; 12/07/2019: 55; 01/11/2020: 61; 02/21/2020:65    Time 6    Period Weeks    Status Achieved      PT LONG TERM GOAL #3   Title Patient will have a worst pain of 2/10 to indicate significant improvement with pain and spasms and allow for performance of work related activities    Baseline 8/10; 12/07/2019: 8/10; 01/11/2020: 6/10; 02/22/2020: 5/10    Time 6    Period Weeks    Status On-going      PT LONG TERM GOAL #4   Title Patient will be able return to occupational tasks without signifcant difficulty and pain to better return to job related duties    Baseline not currently working secondary to pain; 12/07/2019: Increased pain during the day at work; 01/11/2020: Increased pain with migranes 1x-2x week limiting occupational tasks on onset.; 02/22/2020: 1 x week (on mondays)    Time 6    Period Weeks    Status On-going                 Plan - 03/06/20 1710    Clinical Impression Statement Continued to focus on improvement of patient's cervical strength and endurance along with shoulder musculature.  Patient's pain has made improvements; however, she continues to have pain throughout workday when required to sit for most of her day. Patient requires verbal cueing for SA punches to improve motor control of exercise. Patient will  continue to benefit form skilled therapy to return to prior level of function.    Personal Factors and Comorbidities Comorbidity 2    Comorbidities Chronic HA    Examination-Activity Limitations Carry;Sit    Examination-Participation Restrictions Yard Work;Other    Stability/Clinical Decision Making Evolving/Moderate complexity    Clinical Decision Making Moderate    Rehab Potential Good  PT Frequency 2x / week    PT Duration 6 weeks    PT Treatment/Interventions Joint Manipulations;Spinal Manipulations;Dry needling;Passive range of motion;Patient/family education;Manual techniques;Therapeutic exercise;Neuromuscular re-education;Canalith Repostioning;Electrical Stimulation;Moist Heat    PT Next Visit Plan Add in pec stretch for improved shoulder/scapular mobility, progress DNF endurance if needed    PT Home Exercise Plan See education section    Consulted and Agree with Plan of Care Patient           Patient will benefit from skilled therapeutic intervention in order to improve the following deficits and impairments:  Pain, Postural dysfunction, Increased muscle spasms, Increased fascial restricitons, Hypomobility, Decreased strength, Decreased range of motion, Decreased mobility, Decreased coordination, Decreased endurance, Decreased activity tolerance  Visit Diagnosis: Cramp and spasm  Cervicalgia     Problem List Patient Active Problem List   Diagnosis Date Noted  . Nausea 08/21/2019  . Abdominal bloating 08/21/2019  . History of COVID-19 06/24/2019  . URI (upper respiratory infection) 08/26/2018  . Thyroid nodule 02/27/2018  . Stress 01/10/2017  . DUB (dysfunctional uterine bleeding) 07/16/2016  . Low back pain 04/30/2015  . Fatigue 02/08/2015  . Skin lesion of breast 08/06/2014  . Health care maintenance 08/06/2014  . Nephrolithiasis 02/11/2013  . Migraine 02/11/2013  . History of frequent urinary tract infections 02/11/2013  . Syncope 02/11/2013  . IBS  (irritable bowel syndrome) 02/11/2013  . Anemia 02/11/2013  . Vitamin D deficiency 02/11/2013   5:49 PM, 03/06/20 Tina Fleming, SPT Student Physical Therapist [place]  [phone]  Tina Fleming 03/06/2020, 5:37 PM  [place] [facility] PHYSICAL AND SPORTS MEDICINE [address], [place], [address] Phone: [phone]   Fax:  [phone]  Name: Tina Fleming MRN: [id number] Date of Birth: 1980-08-21

## 2020-03-07 NOTE — Addendum Note (Signed)
Addended by: Bethanie Dicker on: 03/07/2020 08:09 AM   Modules accepted: Orders

## 2020-03-11 ENCOUNTER — Ambulatory Visit: Payer: BC Managed Care – PPO

## 2020-03-14 ENCOUNTER — Other Ambulatory Visit: Payer: Self-pay

## 2020-03-14 ENCOUNTER — Telehealth: Payer: Self-pay | Admitting: Internal Medicine

## 2020-03-14 ENCOUNTER — Ambulatory Visit (INDEPENDENT_AMBULATORY_CARE_PROVIDER_SITE_OTHER)
Admit: 2020-03-14 | Discharge: 2020-03-14 | Disposition: A | Discharge status: Home / Self Care | Payer: BC Managed Care – PPO | Attending: Emergency Medicine | Admitting: Emergency Medicine

## 2020-03-14 ENCOUNTER — Ambulatory Visit
Admission: EM | Admit: 2020-03-14 | Discharge: 2020-03-14 | Disposition: A | Discharge status: Home / Self Care | Payer: BC Managed Care – PPO

## 2020-03-14 DIAGNOSIS — S8011XA Contusion of right lower leg, initial encounter: Secondary | ICD-10-CM

## 2020-03-14 DIAGNOSIS — R2241 Localized swelling, mass and lump, right lower limb: Secondary | ICD-10-CM

## 2020-03-14 DIAGNOSIS — M79661 Pain in right lower leg: Secondary | ICD-10-CM

## 2020-03-14 NOTE — Discharge Instructions (Addendum)
An ultrasound of your right lower extremity was performed today and there was no evidence of a DVT or blood clot. I suspect you have a hematoma or contusion. You should ice the area every couple of hours for the first 3 days and then switch to heat. Consider using an Ace wrap and elevating the leg to help with swelling as well.  If you develop any worsening symptoms including: Increased pain, increased calf swelling, redness or warmth of the calf, difficulty walking or bearing weight, numbness or tingling, fever, chest tightness or breathing difficulty call EMS or go to ED

## 2020-03-14 NOTE — Telephone Encounter (Signed)
Pt in ER being evaluated.

## 2020-03-14 NOTE — Telephone Encounter (Signed)
Spoken to patient. She received a Covid J&J vaccination. She stated she was having some leg cramping for the past two days and now on her right calf she has a knot and bruise that is tender to touch, no MOI for sx. Past stated her calf his swelling and px is becoming worse. Instructed patient to go to be evaluated at UC/ED. Patient stated she would comply with directions.

## 2020-03-14 NOTE — Telephone Encounter (Signed)
Pt called she is having right calf pain with bruising. Pt doesn't think its a DVT  Had JJ covid vaccine 10/14

## 2020-03-14 NOTE — ED Provider Notes (Signed)
[facility]    CSN: [id number] Arrival date & time: 03/14/20  1030      History   Chief Complaint Chief Complaint  Patient presents with   Leg Pain    HPI Tina Fleming is a 39 y.o. female presenting for concerns of right calf pain and cramping x3 days.  She states that she first noticed the cramping 3 days ago and it was mild.  She says the following day it was little bit more intense.  She states that she noticed a reddened area at that then became purplish and appeared to look like a bruise on the posterior side of her calf.  She says the area is very tender to touch.  Patient denies any increased warmth and has noticed that the calf appears only slightly more swollen.  She denies any associated fever, fatigue.  There is no known injury.  Patient does report that she had the [employer] vaccine 1 week ago.  She says that she felt ill with that for a few days with feeling feverish and fatigued but that resolved.  Patient denies any history of DVT or blood clots.  She denies any history of immunocompromising diseases, autoimmune disease, sedentary lifestyle, recent travel.  She is not taking any oral contraceptives.  Patient denies any chest tightness or pain.  Denies any shortness of breath.  Patient says she is otherwise healthy with history of anemia.  Patient does admit to taking 324 mg aspirin for the past week since she had the [employer] vaccine.  She has no other complaints or concerns.  HPI  Past Medical History:  Diagnosis Date   Abnormal EKG    no cardiology follow  up was needed   Anemia    iron suppliments in past-not currently   Anemia    Heart murmur    asymtomatic   History of kidney stones    IBS (irritable bowel syndrome)    Migraine    Nephrolithiasis    Obesity    PONV (postoperative nausea and vomiting)    prolonged sedation   Vitamin D deficiency     Patient Active Problem List   Diagnosis Date Noted   Nausea  08/21/2019   Abdominal bloating 08/21/2019   History of COVID-19 06/24/2019   URI (upper respiratory infection) 08/26/2018   Thyroid nodule 02/27/2018   Stress 01/10/2017   DUB (dysfunctional uterine bleeding) 07/16/2016   Low back pain 04/30/2015   Fatigue 02/08/2015   Skin lesion of breast 08/06/2014   Health care maintenance 08/06/2014   Nephrolithiasis 02/11/2013   Migraine 02/11/2013   History of frequent urinary tract infections 02/11/2013   Syncope 02/11/2013   IBS (irritable bowel syndrome) 02/11/2013   Anemia 02/11/2013   Vitamin D deficiency 02/11/2013    Past Surgical History:  Procedure Laterality Date   APPENDECTOMY  03/21/12   laproscopic appy   DILATATION & CURETTAGE/HYSTEROSCOPY WITH MYOSURE N/A 03/20/2016   Procedure: DILATATION & CURETTAGE/HYSTEROSCOPY WITH MYOSURE;  Surgeon: Maxie BetterSheronette Cousins, MD;  Location: [facility];  Service: Gynecology;  Laterality: N/A;  45 min. requested   KNEE ARTHROSCOPY  04/24/2011   Procedure: ARTHROSCOPY KNEE;  Surgeon: Javier DockerJeffrey C Beane;  Location: [facility];  Service: Orthopedics;  Laterality: Right;  /Arthroscopy with debridement, right knee   LAPAROSCOPIC APPENDECTOMY  03/21/2012   Procedure: APPENDECTOMY LAPAROSCOPIC;  Surgeon: Clovis Puhomas A. Cornett, MD;  Location: [facility];  Service: General;  Laterality: N/A;   LITHOTRIPSY  URETHRAL STRICTURE DILATATION     as a child    OB History   No obstetric history on file.      Home Medications    Prior to Admission medications   Medication Sig Start Date End Date Taking? Authorizing Provider  aspirin-acetaminophen-caffeine (EXCEDRIN MIGRAINE) [phone] MG tablet Take 1 tablet by mouth every 6 (six) hours as needed for headache or migraine.   Yes [provider]  FERREX 150 150 MG capsule TAKE 1 CAPSULE (150 MG TOTAL) BY MOUTH 2 TIMES DAILY. 07/20/19  Yes Dale Bajandas, MD  Cholecalciferol (VITAMIN D PO) Take 1 capsule by mouth every  morning.    [provider]  cyclobenzaprine (FLEXERIL) 5 MG tablet Take 1 tablet by mouth 2 (two) times daily as needed. 05/12/17   [provider]  EMGALITY 120 MG/ML SOAJ SMARTSIG:1 Pre-Filled Pen Syringe SUB-Q Every 4 Weeks 10/17/19   [provider]  NURTEC 75 MG TBDP Take 1 tablet by mouth daily. 01/22/20   [provider]  ondansetron (ZOFRAN ODT) 4 MG disintegrating tablet Take 1 tablet (4 mg total) by mouth every 12 (twelve) hours as needed for nausea or vomiting. 06/22/17   Dale Augusta, MD  rizatriptan (MAXALT-MLT) 10 MG disintegrating tablet TAKE 1 TABLET BY MOUTH AS NEEDED FOR MIGRAINE. MAY REPEAT IN 2 HOURS IF NEEDED 08/18/18   Dale Weatherford, MD    Family History Family History  Problem Relation Age of Onset   Cancer Mother        pancreatic   Arthritis Mother    Hyperlipidemia Mother    Hypertension Mother    Cancer Father        skin   Arthritis Father    Hyperlipidemia Father    Hypertension Father    Diabetes Father    Ankylosing spondylitis Brother    Spondylitis Brother    Breast cancer Paternal Aunt        9-60   Breast cancer Paternal Aunt        63-60's    Social History Social History   Tobacco Use   Smoking status: Never Smoker   Smokeless tobacco: Never Used  Substance Use Topics   Alcohol use: No    Alcohol/week: 0.0 standard drinks   Drug use: No     Allergies   Sulfa antibiotics   Review of Systems Review of Systems  Constitutional: Negative for fatigue and fever.  Respiratory: Negative for cough, chest tightness, shortness of breath and wheezing.   Cardiovascular: Positive for leg swelling. Negative for chest pain.  Musculoskeletal: Positive for myalgias. Negative for arthralgias, gait problem and joint swelling.  Skin: Positive for color change. Negative for rash and wound.  Neurological: Negative for weakness and numbness.  Hematological: Does not bruise/bleed easily.      Physical Exam Triage Vital Signs ED Triage Vitals  Enc Vitals Group     BP 03/14/20 1136 (!) 147/88     Pulse Rate 03/14/20 1136 72     Resp 03/14/20 1136 18     Temp 03/14/20 1136 98.8 F (37.1 C)     Temp Source 03/14/20 1136 Oral     SpO2 03/14/20 1136 99 %     Weight --      Height --      Head Circumference --      Peak Flow --      Pain Score 03/14/20 1133 6     Pain Loc --      Pain  Edu? --      Excl. in GC? --    No data found.  Updated Vital Signs BP (!) 147/88 (BP Location: Right Arm)    Pulse 72    Temp 98.8 F (37.1 C) (Oral)    Resp 18    LMP 03/08/2020    SpO2 99%       Physical Exam Vitals and nursing note reviewed.  Constitutional:      General: She is not in acute distress.    Appearance: Normal appearance. She is not ill-appearing or toxic-appearing.  HENT:     Head: Normocephalic and atraumatic.  Eyes:     General: No scleral icterus.       Right eye: No discharge.        Left eye: No discharge.     Conjunctiva/sclera: Conjunctivae normal.  Cardiovascular:     Rate and Rhythm: Normal rate and regular rhythm.     Pulses: Normal pulses.     Heart sounds: Normal heart sounds.  Pulmonary:     Effort: Pulmonary effort is normal. No respiratory distress.     Breath sounds: Normal breath sounds. No wheezing, rhonchi or rales.  Musculoskeletal:     Cervical back: Neck supple.  Skin:    General: Skin is dry.     Comments: Right lower extremity: 6 inches down from the patella the right calf measures 17 and half inches and the left calf measures 18 inches.  There is calf tenderness on the right.  There is also an area of swelling, induration ecchymosis approximately 3-1/2 cm in diameter.  Very tender to palpation.  Neurological:     General: No focal deficit present.     Mental Status: She is alert. Mental status is at baseline.     Motor: No weakness.     Gait: Gait normal.  Psychiatric:        Mood and Affect: Mood normal.        Behavior:  Behavior normal.        Thought Content: Thought content normal.      UC Treatments / Results  Labs (all labs ordered are listed, but only abnormal results are displayed) Labs Reviewed - No data to display  EKG   Radiology US Venous Img Lower Unilateral Right  Result Date: 03/14/2020 CLINICAL DATA:  39 year old female presenting with right calf pain 4 days. EXAM: RIGHT LOWER EXTREMITY VENOUS DOPPLER ULTRASOUND TECHNIQUE: Gray-scale sonography with graded compression, as well as color Doppler and duplex ultrasound were performed to evaluate the right lower extremity deep venous systems from the level of the common femoral vein and including the common femoral, femoral, profunda femoral, popliteal and calf veins including the posterior tibial, peroneal and gastrocnemius veins when visible. Spectral Doppler was utilized to evaluate flow at rest and with distal augmentation maneuvers in the common femoral, femoral and popliteal veins. The contralateral common femoral vein was also evaluated for comparison. COMPARISON:  None. FINDINGS: RIGHT LOWER EXTREMITY Common Femoral Vein: No evidence of thrombus. Normal compressibility, respiratory phasicity and response to augmentation. Central Greater Saphenous Vein: No evidence of thrombus. Normal compressibility and flow on color Doppler imaging. Central Profunda Femoral Vein: No evidence of thrombus. Normal compressibility and flow on color Doppler imaging. Femoral Vein: No evidence of thrombus. Normal compressibility, respiratory phasicity and response to augmentation. Popliteal Vein: No evidence of thrombus. Normal compressibility, respiratory phasicity and response to augmentation. Calf Veins: No evidence of thrombus. Normal compressibility and flow on color Doppler imaging.  Venous Reflux:  None. Other Findings:  None. LEFT LOWER EXTREMITY Common Femoral Vein: No evidence of thrombus. Normal compressibility, respiratory phasicity and response to  augmentation. IMPRESSION: No evidence of right lower extremity deep venous thrombosis. Marliss Coots, MD [facility] Electronically Signed   By: Marliss Coots MD   On: 03/14/2020 13:03    Procedures Procedures (including critical care time)  Medications Ordered in UC Medications - No data to display  Initial Impression / Assessment and Plan / UC Course  I have reviewed the triage vital signs and the nursing notes.  Pertinent labs & imaging results that were available during my care of the patient were reviewed by me and considered in my medical decision making (see chart for details).    39 year old female presenting for right calf cramping and pain for the past couple of days with associated bruising in the area. This is 1 week following [employer] vaccination. Patient concerned for DVT. With symptoms and recent [employer] vaccination patient is at increased risk for DVT. She has no other risk factors.  DDx includes hematoma, contusion, superficial venous thrombosis, deep vein thrombosis, abscess, muscle cramps and spasms  Ultrasound of the right lower extremity performed and no evidence of DVT. At this time discussed result with patient advised that she does not have a DVT and symptoms are consistent with a small hematoma of the right calf. Advised cryotherapy for the first 2 to 3 days and then switching to heat if the area remains swollen and tender. Advised stretches, Ace wrap and Tylenol for pain relief. Advised coming to ED for any acute worsening of symptoms or any symptoms that could suggest DVT or PE further. Patient agreeable.   Final Clinical Impressions(s) / UC Diagnoses   Final diagnoses:  Right calf pain  Hematoma of right lower extremity, initial encounter  Localized swelling of right lower leg     Discharge Instructions     An ultrasound of your right lower extremity was performed today and there  was no evidence of a DVT or blood clot. I suspect you have a hematoma or contusion. You should ice the area every couple of hours for the first 3 days and then switch to heat. Consider using an Ace wrap and elevating the leg to help with swelling as well.  If you develop any worsening symptoms including: Increased pain, increased calf swelling, redness or warmth of the calf, difficulty walking or bearing weight, numbness or tingling, fever, chest tightness or breathing difficulty call EMS or go to ED    ED Prescriptions    None     PDMP not reviewed this encounter.   Shirlee Latch, PA-C 03/14/20 1326

## 2020-03-14 NOTE — ED Triage Notes (Signed)
Pt c/o right calf swelling Tuesday evening.  Pt reports calf had intermittent "cramping" that started on Monday. Reports calf pain onset yesterday. Reports leg became red yesterday and then bruising appeared at posterior calf site.  Had the [employer] COVID vaccine last Thursday.  Denies warmth, CP, SOB, fever since having calf pain/swelling. Right calf edematous, no erythema, or warmth.  Took 800mg  ibuprofen this morning. Took 324mg  ASA daily for past week, except yesterday when she took her Excedrin migraine.

## 2020-03-18 ENCOUNTER — Ambulatory Visit: Payer: BC Managed Care – PPO

## 2020-03-18 ENCOUNTER — Encounter: Payer: Self-pay | Admitting: Internal Medicine

## 2020-03-19 NOTE — Telephone Encounter (Signed)
Please call pt and confirm no acute problems.  Can schedule an appt to discuss.

## 2020-03-20 NOTE — Telephone Encounter (Signed)
Scheduled virtual visit with NP.

## 2020-03-21 ENCOUNTER — Telehealth (INDEPENDENT_AMBULATORY_CARE_PROVIDER_SITE_OTHER): Payer: BC Managed Care – PPO | Admitting: Nurse Practitioner

## 2020-03-21 ENCOUNTER — Other Ambulatory Visit: Payer: Self-pay

## 2020-03-21 ENCOUNTER — Encounter: Payer: Self-pay | Admitting: Nurse Practitioner

## 2020-03-21 VITALS — Ht 65.0 in | Wt 240.0 lb

## 2020-03-21 DIAGNOSIS — S8011XD Contusion of right lower leg, subsequent encounter: Secondary | ICD-10-CM

## 2020-03-21 NOTE — Progress Notes (Signed)
Virtual Visit via Video Note  This visit type was conducted due to national recommendations for restrictions regarding the COVID-19 pandemic (e.g. social distancing).  This format is felt to be most appropriate for this patient at this time.  All issues noted in this document were discussed and addressed.  No physical exam was performed (except for noted visual exam findings with Video Visits).   I connected with@ on 03/23/20 at  4:00 PM EDT by a video enabled telemedicine application or telephone and verified that I am speaking with the correct person using two identifiers. Location patient: home Location provider: work or home office Persons participating in the virtual visit: patient, provider  I discussed the limitations, risks, security and privacy concerns of performing an evaluation and management service by telephone and the availability of in person appointments. I also discussed with the patient that there may be a patient responsible charge related to this service. The patient expressed understanding and agreed to proceed.  Reason for visit: Bruise on right  leg since last Wed. No injury. Neg [place] for DVT.  HPI: This 39 year old patient reports that she had the J&J vaccine on 03/07/2020.  She was taking a full-strength 324 mg aspirin for 1 week after that vaccine  because she felt feverish and fatigued.  She also had Excedrin Migraine a couple doses.  She can recall no injury to her leg. She was seen at [facility] on 03/14/2020 for right calf pain and cramping onset 03/11/20.  She has no history of blood clots, she is not taking oral contraception.  She had no chest pain or shortness of breath.  The ultrasound of the right lower leg was negative for DVT.DX: small hematoma of her right calf. Advised ice for 2-3 days and then heat. Ace wrap, stretches.   She was diagnosed with hematoma and used an Ace wrap initially and that seemed to make the bruise feel better.  Now she uses  compression stockings and it definitely helps.  Over this last week, the swelling and tenderness has improved but not resolved.  She has been applying heat.  No rash.  She is ambulating well.  She can take Tylenol as needed.  No shortness of breath or chest pain.  Patient is just perplexed about  how she could have a hematoma and bruise not remember hitting the leg on anything.  ROS: See pertinent positives and negatives per HPI.  Past Medical History:  Diagnosis Date  . Abnormal EKG    no cardiology follow  up was needed  . Anemia    iron suppliments in past-not currently  . Anemia   . Heart murmur    asymtomatic  . History of kidney stones   . IBS (irritable bowel syndrome)   . Migraine   . Nephrolithiasis   . Obesity   . PONV (postoperative nausea and vomiting)    prolonged sedation  . Vitamin D deficiency     Past Surgical History:  Procedure Laterality Date  . APPENDECTOMY  03/21/12   laproscopic appy  . DILATATION & CURETTAGE/HYSTEROSCOPY WITH MYOSURE N/A 03/20/2016   Procedure: DILATATION & CURETTAGE/HYSTEROSCOPY WITH MYOSURE;  Surgeon: Maxie Better, MD;  Location: [facility];  Service: Gynecology;  Laterality: N/A;  45 min. requested  . KNEE ARTHROSCOPY  04/24/2011   Procedure: ARTHROSCOPY KNEE;  Surgeon: Javier Docker;  Location: [facility];  Service: Orthopedics;  Laterality: Right;  /Arthroscopy with debridement, right knee  . LAPAROSCOPIC APPENDECTOMY  03/21/2012  Procedure: APPENDECTOMY LAPAROSCOPIC;  Surgeon: Clovis Pu. Cornett, MD;  Location: [facility];  Service: General;  Laterality: N/A;  . LITHOTRIPSY    . URETHRAL STRICTURE DILATATION     as a child    Family History  Problem Relation Age of Onset  . Cancer Mother        pancreatic  . Arthritis Mother   . Hyperlipidemia Mother   . Hypertension Mother   . Cancer Father        skin  . Arthritis Father   . Hyperlipidemia Father   . Hypertension Father   . Diabetes Father   .  Ankylosing spondylitis Brother   . Spondylitis Brother   . Breast cancer Paternal Aunt        50-60  . Breast cancer Paternal Aunt        50-60's    SOCIAL HX: No nicotine products- non smoker   Current Outpatient Medications:  .  baclofen (LIORESAL) 10 MG tablet, Take by mouth., Disp: , Rfl:  .  Cholecalciferol (VITAMIN D PO), Take 1 capsule by mouth every morning., Disp: , Rfl:  .  EMGALITY 120 MG/ML SOAJ, SMARTSIG:1 Pre-Filled Pen Syringe SUB-Q Every 4 Weeks, Disp: , Rfl:  .  NURTEC 75 MG TBDP, Take 1 tablet by mouth daily., Disp: , Rfl:  .  ondansetron (ZOFRAN ODT) 4 MG disintegrating tablet, Take 1 tablet (4 mg total) by mouth every 12 (twelve) hours as needed for nausea or vomiting., Disp: 20 tablet, Rfl: 0 .  iron polysaccharides (FERREX 150) 150 MG capsule, TAKE 1 CAPSULE (150 MG TOTAL) BY MOUTH 2 TIMES DAILY., Disp: 60 capsule, Rfl: 1  EXAM:  VITALS per patient if applicable:wt 240 lbs  GENERAL: alert, oriented, appears well and in no acute distress  HEENT: atraumatic  NECK: normal movements of the head and neck  LUNGS: on inspection no signs of respiratory distress, breathing rate appears normal, no obvious gross SOB, gasping or wheezing  CV: no obvious cyanosis  MS: moves all visible extremities without noticeable abnormality  Skin:right posterior calf with large bruise. No calf swelling.    PSYCH/NEURO: pleasant and cooperative, no obvious depression or anxiety, speech and thought processing grossly intact  ASSESSMENT AND PLAN:  Discussed the following assessment and plan:  Hematoma of right lower extremity, subsequent encounter  Advised to continue treatment with compression.  She has support compression knee highs now that work well.  She is to put those on in the morning when she gets up and take them off at bedtime.  Apply warm heat when possible. Tylenol for discomfort as needed.  Call back if the bruise does not continue to resolve as expected   I  discussed the assessment and treatment plan with the patient. The patient was provided an opportunity to ask questions and all were answered. The patient agreed with the plan and demonstrated an understanding of the instructions.   The patient was advised to call back or seek an in-person evaluation if the symptoms worsen or if the condition fails to improve as anticipated.  Amedeo Kinsman, NP Adult Nurse Practitioner [facility] [employer] [phone]

## 2020-03-23 ENCOUNTER — Encounter: Payer: Self-pay | Admitting: Nurse Practitioner

## 2020-03-23 DIAGNOSIS — S8011XA Contusion of right lower leg, initial encounter: Secondary | ICD-10-CM | POA: Insufficient documentation

## 2020-03-23 MED ORDER — POLYSACCHARIDE IRON COMPLEX 150 MG PO CAPS
ORAL_CAPSULE | ORAL | 1 refills | Status: DC
Start: 2020-03-23 — End: 2021-02-20

## 2020-03-23 NOTE — Telephone Encounter (Signed)
rx sent in for ferrex to [facility].

## 2020-03-23 NOTE — Addendum Note (Signed)
Addended by: Charm Barges on: 03/23/2020 03:45 PM   Modules accepted: Orders

## 2020-03-23 NOTE — Patient Instructions (Signed)
Advised to continue treatment with compression.  She has support compression knee highs now that work well.  She is to put those on in the morning when she gets up and take them off at bedtime.  Apply warm heat when possible. Tylenol for discomfort as needed.  Call back if the bruise does not continue to resolve as expected.

## 2020-03-27 ENCOUNTER — Other Ambulatory Visit: Payer: Self-pay

## 2020-03-27 ENCOUNTER — Ambulatory Visit: Payer: BC Managed Care – PPO | Attending: Neurology

## 2020-03-27 DIAGNOSIS — M542 Cervicalgia: Secondary | ICD-10-CM | POA: Diagnosis not present

## 2020-03-27 DIAGNOSIS — R252 Cramp and spasm: Secondary | ICD-10-CM | POA: Diagnosis not present

## 2020-03-28 NOTE — Therapy (Signed)
[facility] PHYSICAL AND SPORTS MEDICINE [address], [place], [address] Phone: [phone]   Fax:  [phone]  Physical Therapy Treatment  Patient Details  Name: Tina Fleming MRN: [id number] Date of Birth: 1981-03-21 Referring Provider (PT): Lorin Picket MD   Encounter Date: 03/27/2020   PT End of Session - 03/27/20 1736    Visit Number 27    Number of Visits 29    Date for PT Re-Evaluation 04/03/20    PT Start Time 1730    PT Stop Time 1815    PT Time Calculation (min) 45 min    Activity Tolerance No increased pain;Patient tolerated treatment well    Behavior During Therapy [facility] for tasks assessed/performed           Past Medical History:  Diagnosis Date  . Abnormal EKG    no cardiology follow  up was needed  . Anemia    iron suppliments in past-not currently  . Anemia   . Heart murmur    asymtomatic  . History of kidney stones   . IBS (irritable bowel syndrome)   . Migraine   . Nephrolithiasis   . Obesity   . PONV (postoperative nausea and vomiting)    prolonged sedation  . Vitamin D deficiency     Past Surgical History:  Procedure Laterality Date  . APPENDECTOMY  03/21/12   laproscopic appy  . DILATATION & CURETTAGE/HYSTEROSCOPY WITH MYOSURE N/A 03/20/2016   Procedure: DILATATION & CURETTAGE/HYSTEROSCOPY WITH MYOSURE;  Surgeon: Maxie Better, MD;  Location: [facility];  Service: Gynecology;  Laterality: N/A;  45 min. requested  . KNEE ARTHROSCOPY  04/24/2011   Procedure: ARTHROSCOPY KNEE;  Surgeon: Javier Docker;  [facility];  Service: Orthopedics;  Laterality: Right;  /Arthroscopy with debridement, right knee  . LAPAROSCOPIC APPENDECTOMY  03/21/2012   Procedure: APPENDECTOMY LAPAROSCOPIC;  Surgeon: Clovis Pu. Cornett, MD;  Location: [facility];  Service: General;  Laterality: N/A;  . LITHOTRIPSY    . URETHRAL STRICTURE DILATATION     as a child    There were no vitals filed for this  visit.   Subjective Assessment - 03/27/20 1739    Subjective Patient states no pain currently. Patient states she adjusted her work station to limit the stress onto her neck.    Pertinent History Patient reports increased neck pain that began four years prior. Patient states she also has a history of head aches which has been impacting her ability to work and function overall. Patient states increased pain with performing head movements, most specifically rotation and performing cervical flexion. Patient states she has been out of work secondary to increased neck pain and head aches and this is the case until the week of June 14th 2021. Patient states she has been using ice with minimal improvement and managing with pain with pain medications where necessary. Increase in pain 4 months prior without moi. Patient states she had recently receive a occipital nerve block which helped minimally with her symptoms. Patient states she would like to decrease her pain overall so she can return to work.    Limitations Lifting    Diagnostic tests MRI, Xray    Patient Stated Goals Decrease pain    Currently in Pain? No/denies    Pain Onset More than a month ago                 TREATMENT Therapeutic Exercise Chin tucks in supine - x 20  SNAGS rotational bilaterally - x 20 Resisted cervical retraction in towel - x 20  Scapular protration in supine - x 20 Shrugs in standing with 7#- x 20 SNAGS into extension - x 20 with towel Scapular rows in standing with TRX straps - x 20 Push ups on TRX straps- x 20 Forward flexion with small push up - x 20 Performed exercises to decrease increased spasms pain     PT Education - 03/27/20 1742    Education Details form/technique with exercise    Person(s) Educated Patient    Methods Explanation;Demonstration    Comprehension Verbalized understanding;Returned demonstration            PT Short Term Goals - 10/26/19 0839      PT SHORT TERM GOAL #1    Title Patient will be independent with HEP to continue benefits of therapy after discharge.    Baseline Dependent with HEP    Time 2    Period Weeks    Status New    [employer] Date 11/08/19             PT Long Term Goals - 02/22/20 0840      PT LONG TERM GOAL #1   Title Patinet will be independent with HEP to continue benefits of therapy until after discharge.    Baseline Dependent with HEP; 12/07/2019: mod A for exercise progression; 01/11/2020: independent    Time 6    Period Weeks    Status Achieved      PT LONG TERM GOAL #2   Title Patient will have a significant improvement in her FOTO score to indicate improvement with cervical function and stability.    Baseline FOTO: Assess NV; 10/26/2019: 53; 12/07/2019: 55; 01/11/2020: 61; 02/21/2020:65    Time 6    Period Weeks    Status Achieved      PT LONG TERM GOAL #3   Title Patient will have a worst pain of 2/10 to indicate significant improvement with pain and spasms and allow for performance of work related activities    Baseline 8/10; 12/07/2019: 8/10; 01/11/2020: 6/10; 02/22/2020: 5/10    Time 6    Period Weeks    Status On-going      PT LONG TERM GOAL #4   Title Patient will be able return to occupational tasks without signifcant difficulty and pain to better return to job related duties    Baseline not currently working secondary to pain; 12/07/2019: Increased pain during the day at work; 01/11/2020: Increased pain with migranes 1x-2x week limiting occupational tasks on onset.; 02/22/2020: 1 x week (on mondays)    Time 6    Period Weeks    Status On-going                 Plan - 03/28/20 0850    Clinical Impression Statement Focused on performing mobility based exercises during today's session secondary to increased overall fatigue from poor sleep. Increased pain with performing shrugging based exercises secondary to upper trap weakness. Good activation of deep cervical flexors B. Will continue to benefit from further  skilled therapy focused on improvin limitations to return to prior level of function.    Personal Factors and Comorbidities Comorbidity 2    Comorbidities Chronic HA    Examination-Activity Limitations Carry;Sit    Examination-Participation Restrictions Yard Work;Other    Stability/Clinical Decision Making Evolving/Moderate complexity    Rehab Potential Good    PT Frequency 2x / week    PT Duration 6 weeks  PT Treatment/Interventions Joint Manipulations;Spinal Manipulations;Dry needling;Passive range of motion;Patient/family education;Manual techniques;Therapeutic exercise;Neuromuscular re-education;Canalith Repostioning;Electrical Stimulation;Moist Heat    PT Next Visit Plan Add in pec stretch for improved shoulder/scapular mobility, progress DNF endurance if needed    PT Home Exercise Plan See education section    Consulted and Agree with Plan of Care Patient           Patient will benefit from skilled therapeutic intervention in order to improve the following deficits and impairments:  Pain, Postural dysfunction, Increased muscle spasms, Increased fascial restricitons, Hypomobility, Decreased strength, Decreased range of motion, Decreased mobility, Decreased coordination, Decreased endurance, Decreased activity tolerance  Visit Diagnosis: Cervicalgia  Cramp and spasm     Problem List Patient Active Problem List   Diagnosis Date Noted  . Traumatic ecchymosis of right lower leg 03/23/2020  . Hematoma of right lower extremity 03/23/2020  . Nausea 08/21/2019  . Abdominal bloating 08/21/2019  . History of COVID-19 06/24/2019  . URI (upper respiratory infection) 08/26/2018  . Thyroid nodule 02/27/2018  . Intractable migraine with aura with status migrainosus 05/14/2017  . Stress 01/10/2017  . DUB (dysfunctional uterine bleeding) 07/16/2016  . Low back pain 04/30/2015  . Fatigue 02/08/2015  . Skin lesion of breast 08/06/2014  . Health care maintenance 08/06/2014  .  Nephrolithiasis 02/11/2013  . Migraine 02/11/2013  . History of frequent urinary tract infections 02/11/2013  . Syncope 02/11/2013  . IBS (irritable bowel syndrome) 02/11/2013  . Anemia 02/11/2013  . Vitamin D deficiency 02/11/2013    Myrene Galas, PT DPT 03/28/2020, 1:29 PM  [facility] PHYSICAL AND SPORTS MEDICINE [address], [place], [address] Phone: [phone]   Fax:  [phone]  Name: KATRENIA ALKINS MRN: [id number] Date of Birth: 1981/01/22

## 2020-04-03 ENCOUNTER — Other Ambulatory Visit: Payer: Self-pay

## 2020-04-03 ENCOUNTER — Ambulatory Visit: Payer: BC Managed Care – PPO

## 2020-04-03 DIAGNOSIS — M542 Cervicalgia: Secondary | ICD-10-CM

## 2020-04-03 DIAGNOSIS — R252 Cramp and spasm: Secondary | ICD-10-CM | POA: Diagnosis not present

## 2020-04-03 NOTE — Therapy (Signed)
[facility] PHYSICAL AND SPORTS MEDICINE [address], [place], [address] Phone: [phone]   Fax:  [phone]  Physical Therapy Treatment  Patient Details  Name: Tina Fleming MRN: [id number] Date of Birth: 08-23-1980 Referring Provider (PT): Lorin Picket MD   Encounter Date: 04/03/2020   PT End of Session - 04/03/20 1819    Visit Number 28    Number of Visits 29    Date for PT Re-Evaluation 04/03/20    PT Start Time 1815    PT Stop Time 1900    PT Time Calculation (min) 45 min    Activity Tolerance No increased pain;Patient tolerated treatment well    Behavior During Therapy [facility] for tasks assessed/performed           Past Medical History:  Diagnosis Date  . Abnormal EKG    no cardiology follow  up was needed  . Anemia    iron suppliments in past-not currently  . Anemia   . Heart murmur    asymtomatic  . History of kidney stones   . IBS (irritable bowel syndrome)   . Migraine   . Nephrolithiasis   . Obesity   . PONV (postoperative nausea and vomiting)    prolonged sedation  . Vitamin D deficiency     Past Surgical History:  Procedure Laterality Date  . APPENDECTOMY  03/21/12   laproscopic appy  . DILATATION & CURETTAGE/HYSTEROSCOPY WITH MYOSURE N/A 03/20/2016   Procedure: DILATATION & CURETTAGE/HYSTEROSCOPY WITH MYOSURE;  Surgeon: Maxie Better, MD;  Location: [facility];  Service: Gynecology;  Laterality: N/A;  45 min. requested  . KNEE ARTHROSCOPY  04/24/2011   Procedure: ARTHROSCOPY KNEE;  Surgeon: Javier Docker;  Location: [facility];  Service: Orthopedics;  Laterality: Right;  /Arthroscopy with debridement, right knee  . LAPAROSCOPIC APPENDECTOMY  03/21/2012   Procedure: APPENDECTOMY LAPAROSCOPIC;  Surgeon: Clovis Pu. Cornett, MD;  Location: [facility];  Service: General;  Laterality: N/A;  . LITHOTRIPSY    . URETHRAL STRICTURE DILATATION     as a child    There were no vitals filed for this  visit.   Subjective Assessment - 04/03/20 1817    Subjective Patient reports she has pain into her ear that increased yesterday and remains today.    Pertinent History Patient reports increased neck pain that began four years prior. Patient states she also has a history of head aches which has been impacting her ability to work and function overall. Patient states increased pain with performing head movements, most specifically rotation and performing cervical flexion. Patient states she has been out of work secondary to increased neck pain and head aches and this is the case until the week of June 14th 2021. Patient states she has been using ice with minimal improvement and managing with pain with pain medications where necessary. Increase in pain 4 months prior without moi. Patient states she had recently receive a occipital nerve block which helped minimally with her symptoms. Patient states she would like to decrease her pain overall so she can return to work.    Limitations Lifting    Diagnostic tests MRI, Xray    Patient Stated Goals Decrease pain    Currently in Pain? Yes    Pain Score 4     Pain Location Ear    Pain Onset More than a month ago           TREATMENT  Manual Therapy  Dry Needling 0.30x82mm to  left SCM to decrease pain/spasm    Therapeutic Exercise R Cervical Rotation x20  L Cervical Lateral Flexion x20  Cervical Retraction with R Rotation x15 L Cervical Lateral Flexion with Retraction x15  Lat Pul[place]own 2x12 20#  Standing Rows 2x12 20# Shrug at [facility] 2x12 35# Shoulder Flexion at [facility] 2x12 5#  Performed exercises to decrease increased spasms pain   PT Education - 04/03/20 1818    Education Details Form/Technique with Exercise    Person(s) Educated Patient    Methods Explanation;Demonstration    Comprehension Verbalized understanding;Returned demonstration            PT Short Term Goals - 10/26/19 0839      PT SHORT TERM GOAL #1   Title Patient will be  independent with HEP to continue benefits of therapy after discharge.    Baseline Dependent with HEP    Time 2    Period Weeks    Status New    [place] Date 11/08/19             PT Long Term Goals - 02/22/20 0840      PT LONG TERM GOAL #1   Title Patinet will be independent with HEP to continue benefits of therapy until after discharge.    Baseline Dependent with HEP; 12/07/2019: mod A for exercise progression; 01/11/2020: independent    Time 6    Period Weeks    Status Achieved      PT LONG TERM GOAL #2   Title Patient will have a significant improvement in her FOTO score to indicate improvement with cervical function and stability.    Baseline FOTO: Assess NV; 10/26/2019: 53; 12/07/2019: 55; 01/11/2020: 61; 02/21/2020:65    Time 6    Period Weeks    Status Achieved      PT LONG TERM GOAL #3   Title Patient will have a worst pain of 2/10 to indicate significant improvement with pain and spasms and allow for performance of work related activities    Baseline 8/10; 12/07/2019: 8/10; 01/11/2020: 6/10; 02/22/2020: 5/10    Time 6    Period Weeks    Status On-going      PT LONG TERM GOAL #4   Title Patient will be able return to occupational tasks without signifcant difficulty and pain to better return to job related duties    Baseline not currently working secondary to pain; 12/07/2019: Increased pain during the day at work; 01/11/2020: Increased pain with migranes 1x-2x week limiting occupational tasks on onset.; 02/22/2020: 1 x week (on mondays)    Time 6    Period Weeks    Status On-going                 Plan - 04/03/20 1844    Clinical Impression Statement Performed DN to L SCM to decrease pain radiating into patient ear due to patient's pain being reproduced by palpation to L SCM. Performed exercises to target SCM and then continued with strengthening cervical musculature along with scapular.  Patient verbally cued for motor control of scapular motion. Patient will continue to  benefit from skilled therapy to return to prior level of function.    Personal Factors and Comorbidities Comorbidity 2    Comorbidities Chronic HA    Examination-Activity Limitations Carry;Sit    Examination-Participation Restrictions Yard Work;Other    Stability/Clinical Decision Making Evolving/Moderate complexity    Clinical Decision Making Moderate    Rehab Potential Good    PT Frequency 2x / week  PT Duration 6 weeks    PT Treatment/Interventions Joint Manipulations;Spinal Manipulations;Dry needling;Passive range of motion;Patient/family education;Manual techniques;Therapeutic exercise;Neuromuscular re-education;Canalith Repostioning;Electrical Stimulation;Moist Heat    PT Next Visit Plan Add in pec stretch for improved shoulder/scapular mobility, progress DNF endurance if needed    PT Home Exercise Plan See education section    Consulted and Agree with Plan of Care Patient           Patient will benefit from skilled therapeutic intervention in order to improve the following deficits and impairments:  Pain, Postural dysfunction, Increased muscle spasms, Increased fascial restricitons, Hypomobility, Decreased strength, Decreased range of motion, Decreased mobility, Decreased coordination, Decreased endurance, Decreased activity tolerance  Visit Diagnosis: Cervicalgia  Cramp and spasm     Problem List Patient Active Problem List   Diagnosis Date Noted  . Traumatic ecchymosis of right lower leg 03/23/2020  . Hematoma of right lower extremity 03/23/2020  . Nausea 08/21/2019  . Abdominal bloating 08/21/2019  . History of COVID-19 06/24/2019  . URI (upper respiratory infection) 08/26/2018  . Thyroid nodule 02/27/2018  . Intractable migraine with aura with status migrainosus 05/14/2017  . Stress 01/10/2017  . DUB (dysfunctional uterine bleeding) 07/16/2016  . Low back pain 04/30/2015  . Fatigue 02/08/2015  . Skin lesion of breast 08/06/2014  . Health care maintenance  08/06/2014  . Nephrolithiasis 02/11/2013  . Migraine 02/11/2013  . History of frequent urinary tract infections 02/11/2013  . Syncope 02/11/2013  . IBS (irritable bowel syndrome) 02/11/2013  . Anemia 02/11/2013  . Vitamin D deficiency 02/11/2013   6:59 PM, 04/03/20 Tina Fleming, Tina Fleming Physical Therapist [place]  [phone]  Tina Fleming 04/03/2020, 6:45 PM  [facility] PHYSICAL AND SPORTS MEDICINE [address], [place], [address] Phone: [phone]   Fax:  [phone]  Name: AMILYAH NACK MRN: [id number] Date of Birth: 06-09-1980

## 2020-04-10 ENCOUNTER — Ambulatory Visit: Payer: BC Managed Care – PPO

## 2020-04-10 ENCOUNTER — Other Ambulatory Visit: Payer: Self-pay

## 2020-04-10 DIAGNOSIS — M542 Cervicalgia: Secondary | ICD-10-CM

## 2020-04-10 DIAGNOSIS — R252 Cramp and spasm: Secondary | ICD-10-CM | POA: Diagnosis not present

## 2020-04-11 NOTE — Therapy (Signed)
[facility]ICINE [address], [place], [address] Phone: [phone]   Fax:  [phone]  Physical Therapy Treatment  Patient Details  Name: Tina Fleming MRN: [id number] Date of Birth: Aug 26, 1980 Referring Provider (PT): Lorin Picket MD   Encounter Date: 04/10/2020   PT End of Session - 04/10/20 1839    Visit Number 29    Number of Visits 29    Date for PT Re-Evaluation 04/03/20    PT Start Time 1815    PT Stop Time 1900    PT Time Calculation (min) 45 min    Activity Tolerance No increased pain;Patient tolerated treatment well    Behavior During Therapy [facility] for tasks assessed/performed           Past Medical History:  Diagnosis Date  . Abnormal EKG    no cardiology follow  up was needed  . Anemia    iron suppliments in past-not currently  . Anemia   . Heart murmur    asymtomatic  . History of kidney stones   . IBS (irritable bowel syndrome)   . Migraine   . Nephrolithiasis   . Obesity   . PONV (postoperative nausea and vomiting)    prolonged sedation  . Vitamin D deficiency     Past Surgical History:  Procedure Laterality Date  . APPENDECTOMY  03/21/12   laproscopic appy  . DILATATION & CURETTAGE/HYSTEROSCOPY WITH MYOSURE N/A 03/20/2016   Procedure: DILATATION & CURETTAGE/HYSTEROSCOPY WITH MYOSURE;  Surgeon: Maxie Better, MD;  Location: [facility];  Service: Gynecology;  Laterality: N/A;  45 min. requested  . KNEE ARTHROSCOPY  04/24/2011   Procedure: ARTHROSCOPY KNEE;  Surgeon: Javier Docker;  Location: [facility];  Service: Orthopedics;  Laterality: Right;  /Arthroscopy with debridement, right knee  . LAPAROSCOPIC APPENDECTOMY  03/21/2012   Procedure: APPENDECTOMY LAPAROSCOPIC;  Surgeon: Clovis Pu. Cornett, MD;  Location: [facility];  Service: General;  Laterality: N/A;  . LITHOTRIPSY    . URETHRAL STRICTURE DILATATION     as a child    There were no vitals filed for this  visit.   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    Pertinent History Patient reports increased neck pain that began four years prior. Patient states she also has a history of head aches which has been impacting her ability to work and function overall. Patient states increased pain with performing head movements, most specifically rotation and performing cervical flexion. Patient states she has been out of work secondary to increased neck pain and head aches and this is the case until the week of June 14th 2021. Patient states she has been using ice with minimal improvement and managing with pain with pain medications where necessary. Increase in pain 4 months prior without moi. Patient states she had recently receive a occipital nerve block which helped minimally with her symptoms. Patient states she would like to decrease her pain overall so she can return to work.    Limitations Lifting    Diagnostic tests MRI, Xray    Patient Stated Goals Decrease pain    Currently in Pain? Yes    Pain Score 2     Pain Location Head    Pain Orientation Right    Pain Descriptors / Indicators Aching    Pain Type Chronic pain    Pain  Onset More than a month ago    Pain Frequency Intermittent           Manual Therapy  While performing STM; Dry Needling (2) 0.30x70mm to left suboccipital muscular to decrease pain/spasm. Performed to decrease increased spasms and pain.     TREATMENT Therapeutic Exercise Cervical rotation in sitting against towel -- x 20  Cervical rotation in sitting with towel with OP -- x 20  Bent over row at [facility] -- 2 x 20 25# Cervical retraction ninto towel, scapular balde squeezes -- x 20   Performed exercises to decrease pain and spasms      PT Education - 04/10/20 1820    Education Details form/technique with exercise    Person(s) Educated Patient    Methods  Explanation;Demonstration    Comprehension Returned demonstration;Verbalized understanding            PT Short Term Goals - 10/26/19 0839      PT SHORT TERM GOAL #1   Title Patient will be independent with HEP to continue benefits of therapy after discharge.    Baseline Dependent with HEP    Time 2    Period Weeks    Status New    Target Date 11/08/19             PT Long Term Goals - 02/22/20 0840      PT LONG TERM GOAL #1   Title Patinet will be independent with HEP to continue benefits of therapy until after discharge.    Baseline Dependent with HEP; 12/07/2019: mod A for exercise progression; 01/11/2020: independent    Time 6    Period Weeks    Status Achieved      PT LONG TERM GOAL #2   Title Patient will have a significant improvement in her FOTO score to indicate improvement with cervical function and stability.    Baseline FOTO: Assess NV; 10/26/2019: 53; 12/07/2019: 55; 01/11/2020: 61; 02/21/2020:65    Time 6    Period Weeks    Status Achieved      PT LONG TERM GOAL #3   Title Patient will have a worst pain of 2/10 to indicate significant improvement with pain and spasms and allow for performance of work related activities    Baseline 8/10; 12/07/2019: 8/10; 01/11/2020: 6/10; 02/22/2020: 5/10    Time 6    Period Weeks    Status On-going      PT LONG TERM GOAL #4   Title Patient will be able return to occupational tasks without signifcant difficulty and pain to better return to job related duties    Baseline not currently working secondary to pain; 12/07/2019: Increased pain during the day at work; 01/11/2020: Increased pain with migranes 1x-2x week limiting occupational tasks on onset.; 02/22/2020: 1 x week (on mondays)    Time 6    Period Weeks    Status On-going                 Plan - 04/11/20 1020    Clinical Impression Statement Continued to focus on improving cervical suboccipital strength and decreased muscular spasms. Continues to have increased  limitations with holding cervical retraction limitations. 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 Patient will benefit from further skilled therapy to return to prior level of function.    Personal Factors and Comorbidities Comorbidity 2    Comorbidities Chronic HA  Examination-Activity Limitations Carry;Sit    Examination-Participation Restrictions Contractor    Stability/Clinical Decision Making Evolving/Moderate complexity    Rehab Potential Good    PT Frequency 2x / week    PT Duration 6 weeks    PT Treatment/Interventions Joint Manipulations;Spinal Manipulations;Dry needling;Passive range of motion;Patient/family education;Manual techniques;Therapeutic exercise;Neuromuscular re-education;Canalith Repostioning;Electrical Stimulation;Moist Heat    PT Next Visit Plan Add in pec stretch for improved shoulder/scapular mobility, progress DNF endurance if needed    PT Home Exercise Plan See education section    Consulted and Agree with Plan of Care Patient           Patient will benefit from skilled therapeutic intervention in order to improve the following deficits and impairments:  Pain, Postural dysfunction, Increased muscle spasms, Increased fascial restricitons, Hypomobility, Decreased strength, Decreased range of motion, Decreased mobility, Decreased coordination, Decreased endurance, Decreased activity tolerance  Visit Diagnosis: Cramp and spasm  Cervicalgia     Problem List Patient Active Problem List   Diagnosis Date Noted  . Traumatic ecchymosis of right lower leg 03/23/2020  . Hematoma of right lower extremity 03/23/2020  . Nausea 08/21/2019  . Abdominal bloating 08/21/2019  . History of COVID-19 06/24/2019  . URI (upper respiratory infection) 08/26/2018  . Thyroid nodule 02/27/2018  . Intractable migraine with aura with status migrainosus  05/14/2017  . Stress 01/10/2017  . DUB (dysfunctional uterine bleeding) 07/16/2016  . Low back pain 04/30/2015  . Fatigue 02/08/2015  . Skin lesion of breast 08/06/2014  . Health care maintenance 08/06/2014  . Nephrolithiasis 02/11/2013  . Migraine 02/11/2013  . History of frequent urinary tract infections 02/11/2013  . Syncope 02/11/2013  . IBS (irritable bowel syndrome) 02/11/2013  . Anemia 02/11/2013  . Vitamin D deficiency 02/11/2013    Myrene Galas, PT DPT 04/11/2020, 10:30 AM  [facility] PHYSICAL AND SPORTS MEDICINE [address] [place], [address] Phone: [phone]   Fax:  [phone]  Name: Tina Fleming MRN: [id number] Date of Birth: 1980/10/13

## 2020-04-24 ENCOUNTER — Ambulatory Visit: Payer: BC Managed Care – PPO

## 2020-05-01 ENCOUNTER — Ambulatory Visit: Payer: BC Managed Care – PPO | Attending: Neurology

## 2020-05-01 ENCOUNTER — Other Ambulatory Visit: Payer: Self-pay

## 2020-05-01 DIAGNOSIS — R252 Cramp and spasm: Secondary | ICD-10-CM

## 2020-05-01 DIAGNOSIS — M542 Cervicalgia: Secondary | ICD-10-CM | POA: Diagnosis not present

## 2020-05-01 NOTE — Addendum Note (Signed)
Addended by: Bethanie Dicker on: 05/01/2020 07:03 PM   Modules accepted: Orders

## 2020-05-01 NOTE — Therapy (Signed)
[place] [facility] PH[facility]ORTS MEDICINE [address], [place], [address] Phone: [phone]   Fax:  [phone]  Physical Therapy Treatment  Patient Details  Name: Tina Fleming MRN: [id number] Date of Birth: [birth year] Referring Provider (PT): Lorin Picket MD   Encounter Date: 05/01/2020   PT End of Session - 05/01/20 1805    Visit Number 30    Number of Visits 36    Date for PT Re-Evaluation 05/22/20    PT Start Time 1730    PT Stop Time 1815    PT Time Calculation (min) 45 min    Activity Tolerance No increased pain;Patient tolerated treatment well    Behavior During Therapy [facility] for tasks assessed/performed           Past Medical History:  Diagnosis Date  . Abnormal EKG    no cardiology follow  up was needed  . Anemia    iron suppliments in past-not currently  . Anemia   . Heart murmur    asymtomatic  . History of kidney stones   . IBS (irritable bowel syndrome)   . Migraine   . Nephrolithiasis   . Obesity   . PONV (postoperative nausea and vomiting)    prolonged sedation  . Vitamin D deficiency     Past Surgical History:  Procedure Laterality Date  . APPENDECTOMY  03/21/12   laproscopic appy  . DILATATION & CURETTAGE/HYSTEROSCOPY WITH MYOSURE N/A 03/20/2016   Procedure: DILATATION & CURETTAGE/HYSTEROSCOPY WITH MYOSURE;  Surgeon: Maxie Better, MD;  Location: [facility];  Service: Gynecology;  Laterality: N/A;  45 min. requested  . KNEE ARTHROSCOPY  04/24/2011   Procedure: ARTHROSCOPY KNEE;  Surgeon: Javier Docker;  Location: [facility];  Service: Orthopedics;  Laterality: Right;  /Arthroscopy with debridement, right knee  . LAPAROSCOPIC APPENDECTOMY  03/21/2012   Procedure: APPENDECTOMY LAPAROSCOPIC;  Surgeon: Clovis Pu. Cornett, MD;  Location: [facility];  Service: General;  Laterality: N/A;  . LITHOTRIPSY    . URETHRAL STRICTURE DILATATION     as a child    There were no vitals filed for this  visit.   Subjective Assessment - 05/01/20 1737    Subjective Patient reports she had increased pain still 1-2x/week. Continues to have increased pain during clinic days.    Pertinent History Patient reports increased neck pain that began four years prior. Patient states she also has a history of head aches which has been impacting her ability to work and function overall. Patient states increased pain with performing head movements, most specifically rotation and performing cervical flexion. Patient states she has been out of work secondary to increased neck pain and head aches and this is the case until the week of June 14th 2021. Patient states she has been using ice with minimal improvement and managing with pain with pain medications where necessary. Increase in pain 4 months prior without moi. Patient states she had recently receive a occipital nerve block which helped minimally with her symptoms. Patient states she would like to decrease her pain overall so she can return to work.    Limitations Lifting    Diagnostic tests MRI, Xray    Patient Stated Goals Decrease pain    Currently in Pain? No/denies    Pain Onset More than a month ago                Manual Therapy   STM; to left suboccipital muscular to decrease pain/spasm. Performed with patient  positoined in prone   Therapeutic Exercise Cervical side bending in sitting against towel -- x 20  Cervical rotation in sitting with towel with OP -- x 20  Cervical retraction into towel - x 20  Cervical retraction ninto towel, scapular balde squeezes -- x 20    Performed exercises to decrease pain and spasms       PT Education - 05/01/20 1804    Education Details form/technique with exercise    Person(s) Educated Patient    Methods Explanation;Demonstration    Comprehension Verbalized understanding;Returned demonstration            PT Short Term Goals - 10/26/19 0839      PT SHORT TERM GOAL #1   Title Patient will be  independent with HEP to continue benefits of therapy after discharge.    Baseline Dependent with HEP    Time 2    Period Weeks    Status New    Target Date 11/08/19             PT Long Term Goals - 04/11/20 1039      PT LONG TERM GOAL #1   Title Patinet will be independent with HEP to continue benefits of therapy until after discharge.    Baseline Dependent with HEP; 12/07/2019: mod A for exercise progression; 01/11/2020: independent    Time 6    Period Weeks    Status Achieved      PT LONG TERM GOAL #2   Title Patient will have a significant improvement in her FOTO score to indicate improvement with cervical function and stability.    Baseline FOTO: Assess NV; 10/26/2019: 53; 12/07/2019: 55; 01/11/2020: 61; 02/21/2020:65    Time 6    Period Weeks    Status Achieved      PT LONG TERM GOAL #3   Title Patient will have a worst pain of 2/10 to indicate significant improvement with pain and spasms and allow for performance of work related activities    Baseline 8/10; 12/07/2019: 8/10; 01/11/2020: 6/10; 02/22/2020: 5/10; 04/11/2020: 5/10    Time 6    Period Weeks    Status On-going      PT LONG TERM GOAL #4   Title Patient will be able return to occupational tasks without signifcant difficulty and pain to better return to job related duties    Baseline not currently working secondary to pain; 12/07/2019: Increased pain during the day at work; 01/11/2020: Increased pain with migranes 1x-2x week limiting occupational tasks on onset.; 02/22/2020: 1 x week (on mondays); 04/11/2020 1 x week    Time 6    Period Weeks    Status On-going                 Plan - 05/01/20 1903    Clinical Impression Statement Continued to focus on improving strength along suboccipital region as well as decrease increased pain and spasms with use of STM. Patient with increased soreness but less overall guarding after performing manual therapy. Otherwise, continued to focus on improving strength along  suboccipital musculature. Patient will benefit from further skilled therapy to return to prior level of function.    Personal Factors and Comorbidities Comorbidity 2    Comorbidities Chronic HA    Examination-Activity Limitations Carry;Sit    Examination-Participation Restrictions Yard Work;Other    Stability/Clinical Decision Making Evolving/Moderate complexity    Rehab Potential Good    PT Frequency 2x / week    PT Duration 6 weeks  PT Treatment/Interventions Joint Manipulations;Spinal Manipulations;Dry needling;Passive range of motion;Patient/family education;Manual techniques;Therapeutic exercise;Neuromuscular re-education;Canalith Repostioning;Electrical Stimulation;Moist Heat    PT Next Visit Plan Add in pec stretch for improved shoulder/scapular mobility, progress DNF endurance if needed    PT Home Exercise Plan See education section    Consulted and Agree with Plan of Care Patient           Patient will benefit from skilled therapeutic intervention in order to improve the following deficits and impairments:  Pain, Postural dysfunction, Increased muscle spasms, Increased fascial restricitons, Hypomobility, Decreased strength, Decreased range of motion, Decreased mobility, Decreased coordination, Decreased endurance, Decreased activity tolerance  Visit Diagnosis: Cramp and spasm  Cervicalgia     Problem List Patient Active Problem List   Diagnosis Date Noted  . Traumatic ecchymosis of right lower leg 03/23/2020  . Hematoma of right lower extremity 03/23/2020  . Nausea 08/21/2019  . Abdominal bloating 08/21/2019  . History of COVID-19 06/24/2019  . URI (upper respiratory infection) 08/26/2018  . Thyroid nodule 02/27/2018  . Intractable migraine with aura with status migrainosus 05/14/2017  . Stress 01/10/2017  . DUB (dysfunctional uterine bleeding) 07/16/2016  . Low back pain 04/30/2015  . Fatigue 02/08/2015  . Skin lesion of breast 08/06/2014  . Health care  maintenance 08/06/2014  . Nephrolithiasis 02/11/2013  . Migraine 02/11/2013  . History of frequent urinary tract infections 02/11/2013  . Syncope 02/11/2013  . IBS (irritable bowel syndrome) 02/11/2013  . Anemia 02/11/2013  . Vitamin D deficiency 02/11/2013    Myrene Galas 05/01/2020, 7:05 PM  [place] [facility] PHYSICAL AND SPORTS MEDICINE [address] [place], [address] Phone: [phone]   Fax:  [phone]  Name: Tina Fleming MRN: [id number] Date of Birth: 05/23/1981

## 2020-05-08 ENCOUNTER — Other Ambulatory Visit: Payer: Self-pay

## 2020-05-08 ENCOUNTER — Ambulatory Visit: Payer: BC Managed Care – PPO

## 2020-05-08 DIAGNOSIS — R252 Cramp and spasm: Secondary | ICD-10-CM

## 2020-05-08 DIAGNOSIS — M542 Cervicalgia: Secondary | ICD-10-CM

## 2020-05-08 NOTE — Therapy (Signed)
[place] [facility] [address], [place], [address] Phone: [phone]   Fax:  [phone]  Physical Therapy Treatment  Patient Details  Name: Tina Fleming MRN: [id number] Date of Birth: 11-Nov-1980 Referring Provider (PT): Lorin Picket MD   Encounter Date: 05/08/2020   PT End of Session - 05/08/20 1832    Visit Number 31    Number of Visits 36    Date for PT Re-Evaluation 05/22/20    PT Start Time 1815    PT Stop Time 1900    PT Time Calculation (min) 45 min    Activity Tolerance No increased pain;Patient tolerated treatment well    Behavior During Therapy [facility] for tasks assessed/performed           Past Medical History:  Diagnosis Date   Abnormal EKG    no cardiology follow  up was needed   Anemia    iron suppliments in past-not currently   Anemia    Heart murmur    asymtomatic   History of kidney stones    IBS (irritable bowel syndrome)    Migraine    Nephrolithiasis    Obesity    PONV (postoperative nausea and vomiting)    prolonged sedation   Vitamin D deficiency     Past Surgical History:  Procedure Laterality Date   APPENDECTOMY  03/21/12   laproscopic appy   DILATATION & CURETTAGE/HYSTEROSCOPY WITH MYOSURE N/A 03/20/2016   Procedure: DILATATION & CURETTAGE/HYSTEROSCOPY WITH MYOSURE;  Surgeon: Maxie Better, MD;  Location: [facility];  Service: Gynecology;  Laterality: N/A;  45 min. requested   KNEE ARTHROSCOPY  04/24/2011   Procedure: ARTHROSCOPY KNEE;  Surgeon: Javier Docker;  Location: [facility];  Service: Orthopedics;  Laterality: Right;  /Arthroscopy with debridement, right knee   LAPAROSCOPIC APPENDECTOMY  03/21/2012   Procedure: APPENDECTOMY LAPAROSCOPIC;  Surgeon: Clovis Pu. Cornett, MD;  Location: [facility];  Service: General;  Laterality: N/A;   LITHOTRIPSY     URETHRAL STRICTURE DILATATION     as a child    There were no vitals filed for this  visit.   Subjective Assessment - 05/08/20 1823    Subjective Patient states she was able to tolerate a 12 hour shift without an increase in pain. Slight increase in pain today.    Pertinent History Patient reports increased neck pain that began four years prior. Patient states she also has a history of head aches which has been impacting her ability to work and function overall. Patient states increased pain with performing head movements, most specifically rotation and performing cervical flexion. Patient states she has been out of work secondary to increased neck pain and head aches and this is the case until the week of June 14th 2021. Patient states she has been using ice with minimal improvement and managing with pain with pain medications where necessary. Increase in pain 4 months prior without moi. Patient states she had recently receive a occipital nerve block which helped minimally with her symptoms. Patient states she would like to decrease her pain overall so she can return to work.    Limitations Lifting    Diagnostic tests MRI, Xray    Patient Stated Goals Decrease pain    Currently in Pain? Yes    Pain Score 2     Pain Location Head    Pain Orientation Right;Left    Pain Descriptors / Indicators Aching    Pain Type Chronic pain  Pain Onset More than a month ago    Pain Frequency Intermittent              TREATMENT   Therapeutic Exercise Cervical side bending in sitting against towel -- x 20  Cervical retraction into towel - x 20  Cervical retraction ninto towel, scapular balder squeezes -- x 20  Scapular rows at OMEGA - x 25 20# Straight arm push downs - x 25 20# Shoulder extension with shoulder depressed - x 20  TRX rows - x 20  Arhcery with RTB - x 20    Performed exercises to decrease pain and spasms       PT Education - 05/08/20 1830    Education Details form/technique with exercise    Person(s) Educated Patient    Methods Explanation;Demonstration     Comprehension Verbalized understanding;Returned demonstration            PT Short Term Goals - 10/26/19 0839      PT SHORT TERM GOAL #1   Title Patient will be independent with HEP to continue benefits of therapy after discharge.    Baseline Dependent with HEP    Time 2    Period Weeks    Status New    Target Date 11/08/19             PT Long Term Goals - 05/08/20 1913      PT LONG TERM GOAL #1   Title Patinet will be independent with HEP to continue benefits of therapy until after discharge.    Baseline Dependent with HEP; 12/07/2019: mod A for exercise progression; 01/11/2020: independent    Time 6    Period Weeks    Status Achieved      PT LONG TERM GOAL #2   Title Patient will have a significant improvement in her FOTO score to indicate improvement with cervical function and stability.    Baseline FOTO: Assess NV; 10/26/2019: 53; 12/07/2019: 55; 01/11/2020: 61; 02/21/2020:65    Time 6    Period Weeks    Status Achieved      PT LONG TERM GOAL #3   Title Patient will have a worst pain of 2/10 to indicate significant improvement with pain and spasms and allow for performance of work related activities    Baseline 8/10; 12/07/2019: 8/10; 01/11/2020: 6/10; 02/22/2020: 5/10; 04/11/2020: 5/10; 05/01/2020: 4/10    Time 6    Period Weeks    Status On-going      PT LONG TERM GOAL #4   Title Patient will be able return to occupational tasks without signifcant difficulty and pain to better return to job related duties    Baseline not currently working secondary to pain; 12/07/2019: Increased pain during the day at work; 01/11/2020: Increased pain with migranes 1x-2x week limiting occupational tasks on onset.; 02/22/2020: 1 x week (on mondays); 04/11/2020 1 x week; 12.8.2021: 1xweek    Time 6    Period Weeks    Status On-going                 Plan - 05/08/20 1914    Clinical Impression Statement Patient demonstrates increased difficulty with performing multiple scapular  retraction exercises without UT activiation. Focused on improving motor control with these actions with tactile cueing to engage scapular musculature. Patient with improvements after cueing, however, fatigues quickly after performance. Patient will benefit from further skilled therapy to return to prior level of function.    Personal Factors and Comorbidities Comorbidity 2  Comorbidities Chronic HA    Examination-Activity Limitations Carry;Sit    Examination-Participation Restrictions Contractor    Stability/Clinical Decision Making Evolving/Moderate complexity    Rehab Potential Good    PT Frequency 2x / week    PT Duration 6 weeks    PT Treatment/Interventions Joint Manipulations;Spinal Manipulations;Dry needling;Passive range of motion;Patient/family education;Manual techniques;Therapeutic exercise;Neuromuscular re-education;Canalith Repostioning;Electrical Stimulation;Moist Heat    PT Next Visit Plan Add in pec stretch for improved shoulder/scapular mobility, progress DNF endurance if needed    PT Home Exercise Plan See education section    Consulted and Agree with Plan of Care Patient           Patient will benefit from skilled therapeutic intervention in order to improve the following deficits and impairments:  Pain,Postural dysfunction,Increased muscle spasms,Increased fascial restricitons,Hypomobility,Decreased strength,Decreased range of motion,Decreased mobility,Decreased coordination,Decreased endurance,Decreased activity tolerance  Visit Diagnosis: Cramp and spasm  Cervicalgia     Problem List Patient Active Problem List   Diagnosis Date Noted   Traumatic ecchymosis of right lower leg 03/23/2020   Hematoma of right lower extremity 03/23/2020   Nausea 08/21/2019   Abdominal bloating 08/21/2019   History of COVID-19 06/24/2019   URI (upper respiratory infection) 08/26/2018   Thyroid nodule 02/27/2018   Intractable migraine with aura with status  migrainosus 05/14/2017   Stress 01/10/2017   DUB (dysfunctional uterine bleeding) 07/16/2016   Low back pain 04/30/2015   Fatigue 02/08/2015   Skin lesion of breast 08/06/2014   Health care maintenance 08/06/2014   Nephrolithiasis 02/11/2013   Migraine 02/11/2013   History of frequent urinary tract infections 02/11/2013   Syncope 02/11/2013   IBS (irritable bowel syndrome) 02/11/2013   Anemia 02/11/2013   Vitamin D deficiency 02/11/2013    Myrene Galas, PT DPT 05/08/2020, 7:20 PM  [place] [facility] [address] [place], [address] Phone: [phone]   Fax:  [phone]  Name: Tina Fleming MRN: [id number] Date of Birth: 1980-12-26

## 2020-05-15 ENCOUNTER — Other Ambulatory Visit: Payer: Self-pay

## 2020-05-15 ENCOUNTER — Ambulatory Visit: Payer: BC Managed Care – PPO

## 2020-05-15 DIAGNOSIS — R252 Cramp and spasm: Secondary | ICD-10-CM

## 2020-05-15 DIAGNOSIS — M542 Cervicalgia: Secondary | ICD-10-CM

## 2020-05-15 NOTE — Therapy (Signed)
[place] [facility] [address] [place], [address] Phone: [phone]   Fax:  [phone]  Physical Therapy Treatment  Patient Details  Name: Tina Fleming MRN: [id number] Date of Birth: 08/11/1980 Referring Provider (PT): Lorin Picket MD   Encounter Date: 05/15/2020   PT End of Session - 05/15/20 1710    Visit Number 32    Number of Visits 36    Date for PT Re-Evaluation 05/22/20    PT Start Time 1700    PT Stop Time 1745    PT Time Calculation (min) 45 min    Activity Tolerance No increased pain;Patient tolerated treatment well    Behavior During Therapy [facility] for tasks assessed/performed           Past Medical History:  Diagnosis Date  . Abnormal EKG    no cardiology follow  up was needed  . Anemia    iron suppliments in past-not currently  . Anemia   . Heart murmur    asymtomatic  . History of kidney stones   . IBS (irritable bowel syndrome)   . Migraine   . Nephrolithiasis   . Obesity   . PONV (postoperative nausea and vomiting)    prolonged sedation  . Vitamin D deficiency     Past Surgical History:  Procedure Laterality Date  . APPENDECTOMY  03/21/12   laproscopic appy  . DILATATION & CURETTAGE/HYSTEROSCOPY WITH MYOSURE N/A 03/20/2016   Procedure: DILATATION & CURETTAGE/HYSTEROSCOPY WITH MYOSURE;  Surgeon: Maxie Better, MD;  Location: [facility];  Service: Gynecology;  Laterality: N/A;  45 min. requested  . KNEE ARTHROSCOPY  04/24/2011   Procedure: ARTHROSCOPY KNEE;  Surgeon: Javier Docker;  Location: [facility];  Service: Orthopedics;  Laterality: Right;  /Arthroscopy with debridement, right knee  . LAPAROSCOPIC APPENDECTOMY  03/21/2012   Procedure: APPENDECTOMY LAPAROSCOPIC;  Surgeon: Clovis Pu. Cornett, MD;  Location: [facility];  Service: General;  Laterality: N/A;  . LITHOTRIPSY    . URETHRAL STRICTURE DILATATION     as a child    There were no vitals filed for this  visit.   Subjective Assessment - 05/15/20 1700    Subjective Patient states increased pain at 12 pm yesterday when sitting for around 4 hours. Patient states pain lasted for the rest of the day.    Pertinent History Patient reports increased neck pain that began four years prior. Patient states she also has a history of head aches which has been impacting her ability to work and function overall. Patient states increased pain with performing head movements, most specifically rotation and performing cervical flexion. Patient states she has been out of work secondary to increased neck pain and head aches and this is the case until the week of June 14th 2021. Patient states she has been using ice with minimal improvement and managing with pain with pain medications where necessary. Increase in pain 4 months prior without moi. Patient states she had recently receive a occipital nerve block which helped minimally with her symptoms. Patient states she would like to decrease her pain overall so she can return to work.    Limitations Lifting    Diagnostic tests MRI, Xray    Patient Stated Goals Decrease pain    Currently in Pain? No/denies    Pain Onset More than a month ago              TREATMENT   Therapeutic Exercise Cervical side bending in  sitting against towel -- x 20  Cervical chin tuck holds for -- 15 sec x 3 Cervical retraction with stabilizer holding for 7 sec at each color red, yellow, green, blue and orange - x 5 Archery with GTB - x 13 with head against towel on wall  Cervical retraction into towel with ball throw - x 20  Cervical retraction into towel - x 10 5 sec hold   Performed exercises to decrease pain and spasms           PT Education - 05/15/20 1709    Education Details form/technique with exercise    Person(s) Educated Patient    Methods Explanation;Demonstration    Comprehension Verbalized understanding;Returned demonstration            PT Short Term Goals  - 05/15/20 1745      PT SHORT TERM GOAL #1   Title Patient will be independent with HEP to continue benefits of therapy after discharge.    Baseline Dependent with HEP; 05/15/2020: independent with HEP    Time 2    Period Weeks    Status Achieved    Target Date 11/08/19             PT Long Term Goals - 05/08/20 1913      PT LONG TERM GOAL #1   Title Patinet will be independent with HEP to continue benefits of therapy until after discharge.    Baseline Dependent with HEP; 12/07/2019: mod A for exercise progression; 01/11/2020: independent    Time 6    Period Weeks    Status Achieved      PT LONG TERM GOAL #2   Title Patient will have a significant improvement in her FOTO score to indicate improvement with cervical function and stability.    Baseline FOTO: Assess NV; 10/26/2019: 53; 12/07/2019: 55; 01/11/2020: 61; 02/21/2020:65    Time 6    Period Weeks    Status Achieved      PT LONG TERM GOAL #3   Title Patient will have a worst pain of 2/10 to indicate significant improvement with pain and spasms and allow for performance of work related activities    Baseline 8/10; 12/07/2019: 8/10; 01/11/2020: 6/10; 02/22/2020: 5/10; 04/11/2020: 5/10; 05/01/2020: 4/10    Time 6    Period Weeks    Status On-going      PT LONG TERM GOAL #4   Title Patient will be able return to occupational tasks without signifcant difficulty and pain to better return to job related duties    Baseline not currently working secondary to pain; 12/07/2019: Increased pain during the day at work; 01/11/2020: Increased pain with migranes 1x-2x week limiting occupational tasks on onset.; 02/22/2020: 1 x week (on mondays); 04/11/2020 1 x week; 12.8.2021: 1xweek    Time 6    Period Weeks    Status On-going                 Plan - 05/15/20 1742    Clinical Impression Statement Patient demonstrates increased onset of symptoms with performance of cervical retraction into towel, greater with more intense retraction.  Performed stabilizer to address motor control with performance. Patient does this relatively well, requiring little cueing to perform after the 3rd rep. Will continue to incorporate cervical retraction into shoulder/scapular movements. Patient will benefit form further skilled therapy to return to prior level of function.    Personal Factors and Comorbidities Comorbidity 2    Comorbidities Chronic HA    Examination-Activity  Limitations Carry;Sit    Examination-Participation Restrictions Contractor    Stability/Clinical Decision Making Evolving/Moderate complexity    Rehab Potential Good    PT Frequency 2x / week    PT Duration 6 weeks    PT Treatment/Interventions Joint Manipulations;Spinal Manipulations;Dry needling;Passive range of motion;Patient/family education;Manual techniques;Therapeutic exercise;Neuromuscular re-education;Canalith Repostioning;Electrical Stimulation;Moist Heat    PT Next Visit Plan Add in pec stretch for improved shoulder/scapular mobility, progress DNF endurance if needed    PT Home Exercise Plan See education section    Consulted and Agree with Plan of Care Patient           Patient will benefit from skilled therapeutic intervention in order to improve the following deficits and impairments:  Pain,Postural dysfunction,Increased muscle spasms,Increased fascial restricitons,Hypomobility,Decreased strength,Decreased range of motion,Decreased mobility,Decreased coordination,Decreased endurance,Decreased activity tolerance  Visit Diagnosis: Cramp and spasm  Cervicalgia     Problem List Patient Active Problem List   Diagnosis Date Noted  . Traumatic ecchymosis of right lower leg 03/23/2020  . Hematoma of right lower extremity 03/23/2020  . Nausea 08/21/2019  . Abdominal bloating 08/21/2019  . History of COVID-19 06/24/2019  . URI (upper respiratory infection) 08/26/2018  . Thyroid nodule 02/27/2018  . Intractable migraine with aura with status  migrainosus 05/14/2017  . Stress 01/10/2017  . DUB (dysfunctional uterine bleeding) 07/16/2016  . Low back pain 04/30/2015  . Fatigue 02/08/2015  . Skin lesion of breast 08/06/2014  . Health care maintenance 08/06/2014  . Nephrolithiasis 02/11/2013  . Migraine 02/11/2013  . History of frequent urinary tract infections 02/11/2013  . Syncope 02/11/2013  . IBS (irritable bowel syndrome) 02/11/2013  . Anemia 02/11/2013  . Vitamin D deficiency 02/11/2013    Myrene Galas, PT DPT 05/15/2020, 5:46 PM  [place] PHYS[facility] AND SPORTS MEDICINE [address], [place], [address] Phone: [phone]   Fax:  [phone]  Name: KARLISSA ARON MRN: [id number] Date of Birth: 08-Feb-1981

## 2020-05-22 ENCOUNTER — Ambulatory Visit: Payer: BC Managed Care – PPO

## 2020-06-26 ENCOUNTER — Other Ambulatory Visit: Payer: Self-pay

## 2020-06-26 ENCOUNTER — Ambulatory Visit: Payer: BC Managed Care – PPO | Attending: Neurology

## 2020-06-26 DIAGNOSIS — R252 Cramp and spasm: Secondary | ICD-10-CM | POA: Diagnosis not present

## 2020-06-26 DIAGNOSIS — M542 Cervicalgia: Secondary | ICD-10-CM | POA: Insufficient documentation

## 2020-06-27 NOTE — Therapy (Signed)
[facility] [address] [place], [address] Phone: [phone]   Fax:  [phone]  Physical Therapy Treatment  Patient Details  Name: Tina Fleming MRN: [id number] Date of Birth: May 20, 1981 Referring Provider (PT): Lorin Picket MD   Encounter Date: 06/26/2020   PT End of Session - 06/26/20 1833    Visit Number 33    Number of Visits 36    Date for PT Re-Evaluation 05/22/20    PT Start Time 1821    PT Stop Time 1905    PT Time Calculation (min) 44 min    Activity Tolerance No increased pain;Patient tolerated treatment well    Behavior During Therapy [facility] for tasks assessed/performed           Past Medical History:  Diagnosis Date  . Abnormal EKG    no cardiology follow  up was needed  . Anemia    iron suppliments in past-not currently  . Anemia   . Heart murmur    asymtomatic  . History of kidney stones   . IBS (irritable bowel syndrome)   . Migraine   . Nephrolithiasis   . Obesity   . PONV (postoperative nausea and vomiting)    prolonged sedation  . Vitamin D deficiency     Past Surgical History:  Procedure Laterality Date  . APPENDECTOMY  03/21/12   laproscopic appy  . DILATATION & CURETTAGE/HYSTEROSCOPY WITH MYOSURE N/A 03/20/2016   Procedure: DILATATION & CURETTAGE/HYSTEROSCOPY WITH MYOSURE;  Surgeon: Maxie Better, MD;  Location: [facility];  Service: Gynecology;  Laterality: N/A;  45 min. requested  . KNEE ARTHROSCOPY  04/24/2011   Procedure: ARTHROSCOPY KNEE;  Surgeon: Javier Docker;  Location: [facility];  Service: Orthopedics;  Laterality: Right;  /Arthroscopy with debridement, right knee  . LAPAROSCOPIC APPENDECTOMY  03/21/2012   Procedure: APPENDECTOMY LAPAROSCOPIC;  Surgeon: Clovis Pu. Cornett, MD;  Location: [facility];  Service: General;  Laterality: N/A;  . LITHOTRIPSY    . URETHRAL STRICTURE DILATATION     as a child    There were no vitals filed for this  visit.   Subjective Assessment - 06/26/20 1826    Subjective Patient states she had no syptoms this week, however she performed exercises this week and changes the amount of pillows she had been using.    Pertinent History Patient reports increased neck pain that began four years prior. Patient states she also has a history of head aches which has been impacting her ability to work and function overall. Patient states increased pain with performing head movements, most specifically rotation and performing cervical flexion. Patient states she has been out of work secondary to increased neck pain and head aches and this is the case until the week of June 14th 2021. Patient states she has been using ice with minimal improvement and managing with pain with pain medications where necessary. Increase in pain 4 months prior without moi. Patient states she had recently receive a occipital nerve block which helped minimally with her symptoms. Patient states she would like to decrease her pain overall so she can return to work.    Limitations Lifting    Diagnostic tests MRI, Xray    Patient Stated Goals Decrease pain    Currently in Pain? No/denies    Pain Onset More than a month ago              TREATMENT Therapeutic Exercise  High Row in standing with  BTB - x 20  Shoulder adduction with band BTB - x 20 Quadruped cervical extension - x 20 Behind the back - handcuff pulls with BTB- x 20 Bent over row B with 8#- x 20 Scapular retraction in standing with BTB - x 20 Shoulder forward/lateral raises- x 20  Arnold press with 3# weights - x 20 Performed exercises to address limitations with cervical stabilization     PT Education - 06/26/20 1833    Education Details form/technique with exercise    Person(s) Educated Patient    Methods Explanation;Demonstration    Comprehension Verbalized understanding;Returned demonstration            PT Short Term Goals - 05/15/20 1745      PT SHORT TERM  GOAL #1   Title Patient will be independent with HEP to continue benefits of therapy after discharge.    Baseline Dependent with HEP; 05/15/2020: independent with HEP    Time 2    Period Weeks    Status Achieved    Target Date 11/08/19             PT Long Term Goals - 06/27/20 1445      PT LONG TERM GOAL #1   Title Patinet will be independent with HEP to continue benefits of therapy until after discharge.    Baseline Dependent with HEP; 12/07/2019: mod A for exercise progression; 01/11/2020: independent    Time 6    Period Weeks    Status Achieved      PT LONG TERM GOAL #2   Title Patient will have a significant improvement in her FOTO score to indicate improvement with cervical function and stability.    Baseline FOTO: Assess NV; 10/26/2019: 53; 12/07/2019: 55; 01/11/2020: 61; 02/21/2020:65    Time 6    Period Weeks    Status Achieved      PT LONG TERM GOAL #3   Title Patient will have a worst pain of 2/10 to indicate significant improvement with pain and spasms and allow for performance of work related activities    Baseline 8/10; 12/07/2019: 8/10; 01/11/2020: 6/10; 02/22/2020: 5/10; 04/11/2020: 5/10; 05/01/2020: 4/10; 06/27/2020: 4/10    Time 6    Period Weeks    Status On-going      PT LONG TERM GOAL #4   Title Patient will be able return to occupational tasks without signifcant difficulty and pain to better return to job related duties    Baseline not currently working secondary to pain; 12/07/2019: Increased pain during the day at work; 01/11/2020: Increased pain with migranes 1x-2x week limiting occupational tasks on onset.; 02/22/2020: 1 x week (on mondays); 04/11/2020 1 x week; 12.8.2021: 1xweek; 06/27/2020: 1xweek    Time 6    Period Weeks    Status On-going                 Plan - 06/27/20 1439    Clinical Impression Statement Continued to focus on improvmenet in UE and cervical muscular strength and coordination with exercises performed today to improve stabilization  with prolonged sitting and positioning. Patient demonstrates similiar funcitonal measures as previous assessment secondary to being off PT for 1 month. Patient will benefit from further skilled therapy to return to prior level of function.    Personal Factors and Comorbidities Comorbidity 2    Comorbidities Chronic HA    Examination-Activity Limitations Carry;Sit    Examination-Participation Restrictions Contractor    Stability/Clinical Decision Making Evolving/Moderate complexity    Rehab Potential  Good    PT Frequency 2x / week    PT Duration 6 weeks    PT Treatment/Interventions Joint Manipulations;Spinal Manipulations;Dry needling;Passive range of motion;Patient/family education;Manual techniques;Therapeutic exercise;Neuromuscular re-education;Canalith Repostioning;Electrical Stimulation;Moist Heat    PT Next Visit Plan Add in pec stretch for improved shoulder/scapular mobility, progress DNF endurance if needed    PT Home Exercise Plan See education section    Consulted and Agree with Plan of Care Patient           Patient will benefit from skilled therapeutic intervention in order to improve the following deficits and impairments:  Pain,Postural dysfunction,Increased muscle spasms,Increased fascial restricitons,Hypomobility,Decreased strength,Decreased range of motion,Decreased mobility,Decreased coordination,Decreased endurance,Decreased activity tolerance  Visit Diagnosis: Cramp and spasm - Plan: PT plan of care cert/re-cert  Cervicalgia - Plan: PT plan of care cert/re-cert     Problem List Patient Active Problem List   Diagnosis Date Noted  . Traumatic ecchymosis of right lower leg 03/23/2020  . Hematoma of right lower extremity 03/23/2020  . Nausea 08/21/2019  . Abdominal bloating 08/21/2019  . History of COVID-19 06/24/2019  . URI (upper respiratory infection) 08/26/2018  . Thyroid nodule 02/27/2018  . Intractable migraine with aura with status migrainosus  05/14/2017  . Stress 01/10/2017  . DUB (dysfunctional uterine bleeding) 07/16/2016  . Low back pain 04/30/2015  . Fatigue 02/08/2015  . Skin lesion of breast 08/06/2014  . Health care maintenance 08/06/2014  . Nephrolithiasis 02/11/2013  . Migraine 02/11/2013  . History of frequent urinary tract infections 02/11/2013  . Syncope 02/11/2013  . IBS (irritable bowel syndrome) 02/11/2013  . Anemia 02/11/2013  . Vitamin D deficiency 02/11/2013    Myrene Galas, PT DPT 06/27/2020, 2:48 PM  [facility] PHYS[facility] AND SPORTS MEDICINE [address], [place], [address] Phone: [phone]   Fax:  [phone]  Name: Tina Fleming MRN: [id number] Date of Birth: 1981/04/18

## 2020-07-03 ENCOUNTER — Ambulatory Visit: Payer: BC Managed Care – PPO

## 2020-07-05 DIAGNOSIS — Z20822 Contact with and (suspected) exposure to covid-19: Secondary | ICD-10-CM | POA: Diagnosis not present

## 2020-07-10 ENCOUNTER — Ambulatory Visit: Payer: BC Managed Care – PPO

## 2020-07-10 ENCOUNTER — Other Ambulatory Visit: Payer: Self-pay

## 2020-07-10 DIAGNOSIS — M542 Cervicalgia: Secondary | ICD-10-CM | POA: Diagnosis not present

## 2020-07-10 DIAGNOSIS — R252 Cramp and spasm: Secondary | ICD-10-CM | POA: Diagnosis not present

## 2020-07-10 NOTE — Therapy (Signed)
[name] [address] [place], [address] Phone: [phone]   Fax:  [phone]  Physical Therapy Treatment  Patient Details  Name: Tina Fleming MRN: [id number] Date of Birth: Aug 15, 1980 Referring Provider (PT): Lorin Picket MD   Encounter Date: 07/10/2020   PT End of Session - 07/10/20 1913    Visit Number 34    Number of Visits 36    Date for PT Re-Evaluation 05/22/20    PT Start Time 1815    PT Stop Time 1900    PT Time Calculation (min) 45 min    Activity Tolerance No increased pain;Patient tolerated treatment well    Behavior During Therapy [facility] for tasks assessed/performed           Past Medical History:  Diagnosis Date  . Abnormal EKG    no cardiology follow  up was needed  . Anemia    iron suppliments in past-not currently  . Anemia   . Heart murmur    asymtomatic  . History of kidney stones   . IBS (irritable bowel syndrome)   . Migraine   . Nephrolithiasis   . Obesity   . PONV (postoperative nausea and vomiting)    prolonged sedation  . Vitamin D deficiency     Past Surgical History:  Procedure Laterality Date  . APPENDECTOMY  03/21/12   laproscopic appy  . DILATATION & CURETTAGE/HYSTEROSCOPY WITH MYOSURE N/A 03/20/2016   Procedure: DILATATION & CURETTAGE/HYSTEROSCOPY WITH MYOSURE;  Surgeon: Maxie Better, MD;  Location: [facility];  Service: Gynecology;  Laterality: N/A;  45 min. requested  . KNEE ARTHROSCOPY  04/24/2011   Procedure: ARTHROSCOPY KNEE;  Surgeon: Javier Docker;  Location: [facility];  Service: Orthopedics;  Laterality: Right;  /Arthroscopy with debridement, right knee  . LAPAROSCOPIC APPENDECTOMY  03/21/2012   Procedure: APPENDECTOMY LAPAROSCOPIC;  Surgeon: Clovis Pu. Cornett, MD;  Location: [facility];  Service: General;  Laterality: N/A;  . LITHOTRIPSY    . URETHRAL STRICTURE DILATATION     as a child    There were no vitals filed for this  visit.   Subjective Assessment - 07/10/20 1912    Subjective Overall pt reports she feels like her neck is moving better.  Headaches are occurring less frequently.  She is still exploring if the use of 1 pillow for sleeping has been helpful.  She is very sore along L scapular mm today.  She has been working on her HEP.    Pertinent History Patient reports increased neck pain that began four years prior. Patient states she also has a history of head aches which has been impacting her ability to work and function overall. Patient states increased pain with performing head movements, most specifically rotation and performing cervical flexion. Patient states she has been out of work secondary to increased neck pain and head aches and this is the case until the week of June 14th 2021. Patient states she has been using ice with minimal improvement and managing with pain with pain medications where necessary. Increase in pain 4 months prior without moi. Patient states she had recently receive a occipital nerve block which helped minimally with her symptoms. Patient states she would like to decrease her pain overall so she can return to work.    Limitations Lifting    Diagnostic tests MRI, Xray    Patient Stated Goals Decrease pain    Pain Onset More than a month ago  Treatment Today: (+) TP L rhomboids/mid trap Manual Tx: TPR L rhomboids/traps; Thoracic 3-7 CPA Gr 1-2 mob, scapulothoracic mobs multi-directional  Therapeutic Exercises:  High Row in standing with BTB - x 20  Shoulder adduction with band BTB - x 20 Bent over row B with 8#- x 20 Scapular retraction in standing with BTB - x 20 Chin tuck in standing with scapular retraction at wall x15, added wall "snow angels" x15                 Arnold press with 3# weights - x 20 Performed exercises to address limitations with cervical stabilization      PT Education - 07/10/20 1913    Education Details exercise technique/form     Person(s) Educated Patient    Methods Explanation            PT Short Term Goals - 05/15/20 1745      PT SHORT TERM GOAL #1   Title Patient will be independent with HEP to continue benefits of therapy after discharge.    Baseline Dependent with HEP; 05/15/2020: independent with HEP    Time 2    Period Weeks    Status Achieved    Target Date 11/08/19             PT Long Term Goals - 06/27/20 1445      PT LONG TERM GOAL #1   Title Patinet will be independent with HEP to continue benefits of therapy until after discharge.    Baseline Dependent with HEP; 12/07/2019: mod A for exercise progression; 01/11/2020: independent    Time 6    Period Weeks    Status Achieved      PT LONG TERM GOAL #2   Title Patient will have a significant improvement in her FOTO score to indicate improvement with cervical function and stability.    Baseline FOTO: Assess NV; 10/26/2019: 53; 12/07/2019: 55; 01/11/2020: 61; 02/21/2020:65    Time 6    Period Weeks    Status Achieved      PT LONG TERM GOAL #3   Title Patient will have a worst pain of 2/10 to indicate significant improvement with pain and spasms and allow for performance of work related activities    Baseline 8/10; 12/07/2019: 8/10; 01/11/2020: 6/10; 02/22/2020: 5/10; 04/11/2020: 5/10; 05/01/2020: 4/10; 06/27/2020: 4/10    Time 6    Period Weeks    Status On-going      PT LONG TERM GOAL #4   Title Patient will be able return to occupational tasks without signifcant difficulty and pain to better return to job related duties    Baseline not currently working secondary to pain; 12/07/2019: Increased pain during the day at work; 01/11/2020: Increased pain with migranes 1x-2x week limiting occupational tasks on onset.; 02/22/2020: 1 x week (on mondays); 04/11/2020 1 x week; 12.8.2021: 1xweek; 06/27/2020: 1xweek    Time 6    Period Weeks    Status On-going                 Plan - 07/10/20 1914    Clinical Impression Statement Pt responded well to  manual tx for TP in L rhomboids/mid trap today as she was able to flex and abd her L shoulder without pain after tx.  She does still require frequent cues for technique and form during postural mm retraining.    Personal Factors and Comorbidities Comorbidity 2    Comorbidities Chronic HA    Examination-Activity Limitations Carry;Sit  Examination-Participation Restrictions Contractor    Stability/Clinical Decision Making Evolving/Moderate complexity    Rehab Potential Good    PT Frequency 2x / week    PT Duration 6 weeks    PT Treatment/Interventions Joint Manipulations;Spinal Manipulations;Dry needling;Passive range of motion;Patient/family education;Manual techniques;Therapeutic exercise;Neuromuscular re-education;Canalith Repostioning;Electrical Stimulation;Moist Heat    PT Next Visit Plan Add in pec stretch for improved shoulder/scapular mobility, progress DNF endurance if needed    PT Home Exercise Plan See education section    Consulted and Agree with Plan of Care Patient           Patient will benefit from skilled therapeutic intervention in order to improve the following deficits and impairments:  Pain,Postural dysfunction,Increased muscle spasms,Increased fascial restricitons,Hypomobility,Decreased strength,Decreased range of motion,Decreased mobility,Decreased coordination,Decreased endurance,Decreased activity tolerance  Visit Diagnosis: Cramp and spasm  Cervicalgia     Problem List Patient Active Problem List   Diagnosis Date Noted  . Traumatic ecchymosis of right lower leg 03/23/2020  . Hematoma of right lower extremity 03/23/2020  . Nausea 08/21/2019  . Abdominal bloating 08/21/2019  . History of COVID-19 06/24/2019  . URI (upper respiratory infection) 08/26/2018  . Thyroid nodule 02/27/2018  . Intractable migraine with aura with status migrainosus 05/14/2017  . Stress 01/10/2017  . DUB (dysfunctional uterine bleeding) 07/16/2016  . Low back pain  04/30/2015  . Fatigue 02/08/2015  . Skin lesion of breast 08/06/2014  . Health care maintenance 08/06/2014  . Nephrolithiasis 02/11/2013  . Migraine 02/11/2013  . History of frequent urinary tract infections 02/11/2013  . Syncope 02/11/2013  . IBS (irritable bowel syndrome) 02/11/2013  . Anemia 02/11/2013  . Vitamin D deficiency 02/11/2013    Ardine Bjork 07/10/2020, 7:16 PM Max Fickle, PT, DPT  [facility] PHYSICAL AND SPORTS MEDICINE [address], [place], [address] Phone: [phone]   Fax:  [phone]  Name: NATALINA WIETING MRN: [id number] Date of Birth: [birth year]

## 2020-07-17 ENCOUNTER — Other Ambulatory Visit: Payer: Self-pay

## 2020-07-17 ENCOUNTER — Ambulatory Visit: Payer: BC Managed Care – PPO

## 2020-07-17 DIAGNOSIS — M542 Cervicalgia: Secondary | ICD-10-CM | POA: Diagnosis not present

## 2020-07-17 DIAGNOSIS — R252 Cramp and spasm: Secondary | ICD-10-CM

## 2020-07-18 NOTE — Therapy (Addendum)
[place] [facility]INE [address], [place], [address] Phone: [phone]   Fax:  [phone]  Physical Therapy Treatment  Patient Details  Name: Tina Fleming MRN: [id number] Date of Birth: [birth year] Referring Provider (PT): Lorin Picket MD   Encounter Date: 07/17/2020   PT End of Session - 07/18/20 1334    Visit Number 35    Number of Visits 36    Date for PT Re-Evaluation 08/21/20    PT Start Time 1815    PT Stop Time 1900    PT Time Calculation (min) 45 min    Activity Tolerance No increased pain;Patient tolerated treatment well    Behavior During Therapy [facility] for tasks assessed/performed           Past Medical History:  Diagnosis Date  . Abnormal EKG    no cardiology follow  up was needed  . Anemia    iron suppliments in past-not currently  . Anemia   . Heart murmur    asymtomatic  . History of kidney stones   . IBS (irritable bowel syndrome)   . Migraine   . Nephrolithiasis   . Obesity   . PONV (postoperative nausea and vomiting)    prolonged sedation  . Vitamin D deficiency     Past Surgical History:  Procedure Laterality Date  . APPENDECTOMY  03/21/12   laproscopic appy  . DILATATION & CURETTAGE/HYSTEROSCOPY WITH MYOSURE N/A 03/20/2016   Procedure: DILATATION & CURETTAGE/HYSTEROSCOPY WITH MYOSURE;  Surgeon: Maxie Better, MD;  Location: [facility];  Service: Gynecology;  Laterality: N/A;  45 min. requested  . KNEE ARTHROSCOPY  04/24/2011   Procedure: ARTHROSCOPY KNEE;  Surgeon: Javier Docker;  Location: [facility];  Service: Orthopedics;  Laterality: Right;  /Arthroscopy with debridement, right knee  . LAPAROSCOPIC APPENDECTOMY  03/21/2012   Procedure: APPENDECTOMY LAPAROSCOPIC;  Surgeon: Clovis Pu. Cornett, MD;  Location: [facility];  Service: General;  Laterality: N/A;  . LITHOTRIPSY    . URETHRAL STRICTURE DILATATION     as a child    There were no vitals filed for this  visit.   Subjective Assessment - 07/18/20 1332    Subjective Patient states she has had less HAs this past week compared to the previous week. Reports she has been performing her exercises.    Pertinent History Patient reports increased neck pain that began four years prior. Patient states she also has a history of head aches which has been impacting her ability to work and function overall. Patient states increased pain with performing head movements, most specifically rotation and performing cervical flexion. Patient states she has been out of work secondary to increased neck pain and head aches and this is the case until the week of June 14th 2021. Patient states she has been using ice with minimal improvement and managing with pain with pain medications where necessary. Increase in pain 4 months prior without moi. Patient states she had recently receive a occipital nerve block which helped minimally with her symptoms. Patient states she would like to decrease her pain overall so she can return to work.    Limitations Lifting    Diagnostic tests MRI, Xray    Patient Stated Goals Decrease pain    Currently in Pain? No/denies    Pain Onset More than a month ago            TREATMENT Therapeutic Exercise  High Row in standing with BTB - 2 x  20  Scapular retraction in standing with BTB - x 20 Archery - with BTB - x 20 Quadruped with cervical extension - x 20  Seated cervical retraction with rotation -x 20 Resisted cervical lateral flexion B - x 20  Behind the back - handcuff pulls with BTB- x 20 Resisted cervical diagonal retraction - x 20  Cervical extension with towel behind the neck - x 20   Performed exercises to address limitations with cervical stabilization         PT Education - 07/18/20 1334    Education Details form/technique with exercise;    Person(s) Educated Patient    Methods Explanation;Demonstration    Comprehension Verbalized understanding;Returned demonstration             PT Short Term Goals - 05/15/20 1745      PT SHORT TERM GOAL #1   Title Patient will be independent with HEP to continue benefits of therapy after discharge.    Baseline Dependent with HEP; 05/15/2020: independent with HEP    Time 2    Period Weeks    Status Achieved    Target Date 11/08/19             PT Long Term Goals - 06/27/20 1445      PT LONG TERM GOAL #1   Title Patinet will be independent with HEP to continue benefits of therapy until after discharge.    Baseline Dependent with HEP; 12/07/2019: mod A for exercise progression; 01/11/2020: independent    Time 6    Period Weeks    Status Achieved      PT LONG TERM GOAL #2   Title Patient will have a significant improvement in her FOTO score to indicate improvement with cervical function and stability.    Baseline FOTO: Assess NV; 10/26/2019: 53; 12/07/2019: 55; 01/11/2020: 61; 02/21/2020:65    Time 6    Period Weeks    Status Achieved      PT LONG TERM GOAL #3   Title Patient will have a worst pain of 2/10 to indicate significant improvement with pain and spasms and allow for performance of work related activities    Baseline 8/10; 12/07/2019: 8/10; 01/11/2020: 6/10; 02/22/2020: 5/10; 04/11/2020: 5/10; 05/01/2020: 4/10; 06/27/2020: 4/10    Time 6    Period Weeks    Status On-going      PT LONG TERM GOAL #4   Title Patient will be able return to occupational tasks without signifcant difficulty and pain to better return to job related duties    Baseline not currently working secondary to pain; 12/07/2019: Increased pain during the day at work; 01/11/2020: Increased pain with migranes 1x-2x week limiting occupational tasks on onset.; 02/22/2020: 1 x week (on mondays); 04/11/2020 1 x week; 12.8.2021: 1xweek; 06/27/2020: 1xweek    Time 6    Period Weeks    Status On-going                 Plan - 07/18/20 1339    Clinical Impression Statement Performed exercises to address and strengthen cervical stabilization  structures such as the rhomboids and periscapular musculature. Patient demonstrates increased pain and weakness with performing overhead movements, however this is around the scapular area as well as along the the shoulders. Patient will benefit from further skilled therapy to return to prior level of function.    Personal Factors and Comorbidities Comorbidity 2    Comorbidities Chronic HA    Examination-Activity Limitations Carry;Sit    Examination-Participation Restrictions  Yard Work;Other    Stability/Clinical Decision Making Evolving/Moderate complexity    Rehab Potential Good    PT Frequency 2x / week    PT Duration 6 weeks    PT Treatment/Interventions Joint Manipulations;Spinal Manipulations;Dry needling;Passive range of motion;Patient/family education;Manual techniques;Therapeutic exercise;Neuromuscular re-education;Canalith Repostioning;Electrical Stimulation;Moist Heat    PT Next Visit Plan Add in pec stretch for improved shoulder/scapular mobility, progress DNF endurance if needed    PT Home Exercise Plan See education section    Consulted and Agree with Plan of Care Patient           Patient will benefit from skilled therapeutic intervention in order to improve the following deficits and impairments:  Pain,Postural dysfunction,Increased muscle spasms,Increased fascial restricitons,Hypomobility,Decreased strength,Decreased range of motion,Decreased mobility,Decreased coordination,Decreased endurance,Decreased activity tolerance  Visit Diagnosis: Cramp and spasm  Cervicalgia     Problem List Patient Active Problem List   Diagnosis Date Noted  . Traumatic ecchymosis of right lower leg 03/23/2020  . Hematoma of right lower extremity 03/23/2020  . Nausea 08/21/2019  . Abdominal bloating 08/21/2019  . History of COVID-19 06/24/2019  . URI (upper respiratory infection) 08/26/2018  . Thyroid nodule 02/27/2018  . Intractable migraine with aura with status migrainosus  05/14/2017  . Stress 01/10/2017  . DUB (dysfunctional uterine bleeding) 07/16/2016  . Low back pain 04/30/2015  . Fatigue 02/08/2015  . Skin lesion of breast 08/06/2014  . Health care maintenance 08/06/2014  . Nephrolithiasis 02/11/2013  . Migraine 02/11/2013  . History of frequent urinary tract infections 02/11/2013  . Syncope 02/11/2013  . IBS (irritable bowel syndrome) 02/11/2013  . Anemia 02/11/2013  . Vitamin D deficiency 02/11/2013    Myrene Galas, PT DPT 07/18/2020, 2:46 PM  [place] [facility] PH[facility] AND SPORTS MEDICINE [address], [place], [address] Phone: [phone]   Fax:  [phone]  Name: WEDNESDAY ERICSSON MRN: [id number] Date of Birth: 10-21-1980

## 2020-07-24 ENCOUNTER — Ambulatory Visit: Payer: BC Managed Care – PPO

## 2020-07-31 ENCOUNTER — Ambulatory Visit: Payer: BC Managed Care – PPO | Attending: Neurology

## 2020-07-31 ENCOUNTER — Other Ambulatory Visit: Payer: Self-pay

## 2020-07-31 DIAGNOSIS — M542 Cervicalgia: Secondary | ICD-10-CM | POA: Diagnosis not present

## 2020-07-31 DIAGNOSIS — R252 Cramp and spasm: Secondary | ICD-10-CM | POA: Diagnosis not present

## 2020-07-31 NOTE — Therapy (Signed)
[place] [facility] [address], [place], [address] Phone: [phone]   Fax:  [phone]  Physical Therapy Treatment  Patient Details  Name: Tina Fleming MRN: [id number] Date of Birth: [birth year] Referring Provider (PT): Lorin Picket MD   Encounter Date: 07/31/2020   PT End of Session - 07/31/20 0825    Visit Number 36    Number of Visits 42    Date for PT Re-Evaluation 08/21/20    PT Start Time 0820    PT Stop Time 0900    PT Time Calculation (min) 40 min    Activity Tolerance No increased pain;Patient tolerated treatment well    Behavior During Therapy [facility] for tasks assessed/performed           Past Medical History:  Diagnosis Date  . Abnormal EKG    no cardiology follow  up was needed  . Anemia    iron suppliments in past-not currently  . Anemia   . Heart murmur    asymtomatic  . History of kidney stones   . IBS (irritable bowel syndrome)   . Migraine   . Nephrolithiasis   . Obesity   . PONV (postoperative nausea and vomiting)    prolonged sedation  . Vitamin D deficiency     Past Surgical History:  Procedure Laterality Date  . APPENDECTOMY  03/21/12   laproscopic appy  . DILATATION & CURETTAGE/HYSTEROSCOPY WITH MYOSURE N/A 03/20/2016   Procedure: DILATATION & CURETTAGE/HYSTEROSCOPY WITH MYOSURE;  Surgeon: Maxie Better, MD;  Location: [facility];  Service: Gynecology;  Laterality: N/A;  45 min. requested  . KNEE ARTHROSCOPY  04/24/2011   Procedure: ARTHROSCOPY KNEE;  Surgeon: Javier Docker;  Location: [facility];  Service: Orthopedics;  Laterality: Right;  /Arthroscopy with debridement, right knee  . LAPAROSCOPIC APPENDECTOMY  03/21/2012   Procedure: APPENDECTOMY LAPAROSCOPIC;  Surgeon: Clovis Pu. Cornett, MD;  Location: [facility];  Service: General;  Laterality: N/A;  . LITHOTRIPSY    . URETHRAL STRICTURE DILATATION     as a child    There were no vitals filed for this  visit.   Subjective Assessment - 07/31/20 0822    Subjective Patient states she had one HA yesterday but no headache last week. Patient states no major changes otherwise.    Pertinent History Patient reports increased neck pain that began four years prior. Patient states she also has a history of head aches which has been impacting her ability to work and function overall. Patient states increased pain with performing head movements, most specifically rotation and performing cervical flexion. Patient states she has been out of work secondary to increased neck pain and head aches and this is the case until the week of June 14th 2021. Patient states she has been using ice with minimal improvement and managing with pain with pain medications where necessary. Increase in pain 4 months prior without moi. Patient states she had recently receive a occipital nerve block which helped minimally with her symptoms. Patient states she would like to decrease her pain overall so she can return to work.    Limitations Lifting    Diagnostic tests MRI, Xray    Patient Stated Goals Decrease pain    Currently in Pain? No/denies    Pain Onset More than a month ago                 TREATMENT Therapeutic Exercise  Cervical extension with towel behind the neck -  x 20  Cervical Sidebending with towel along side of neck - x 20 Seated cervical retraction with rotation -x 20 Seated cervical rotation in sitting with towel - x 20  Behind the back scapular retractions in standing - x 20  Head retraction and scapular retraction with - x 20  Shoulder flexion with full foam roller - x 20  Scapular protractin/retraction - x 10    Performed exercises to address limitations with cervical stabilization     PT Education - 07/31/20 0824    Education Details form/technique with exercise    Person(s) Educated Patient    Methods Explanation;Demonstration    Comprehension Verbalized understanding;Returned demonstration             PT Short Term Goals - 05/15/20 1745      PT SHORT TERM GOAL #1   Title Patient will be independent with HEP to continue benefits of therapy after discharge.    Baseline Dependent with HEP; 05/15/2020: independent with HEP    Time 2    Period Weeks    Status Achieved    Target Date 11/08/19             PT Long Term Goals - 06/27/20 1445      PT LONG TERM GOAL #1   Title Patinet will be independent with HEP to continue benefits of therapy until after discharge.    Baseline Dependent with HEP; 12/07/2019: mod A for exercise progression; 01/11/2020: independent    Time 6    Period Weeks    Status Achieved      PT LONG TERM GOAL #2   Title Patient will have a significant improvement in her FOTO score to indicate improvement with cervical function and stability.    Baseline FOTO: Assess NV; 10/26/2019: 53; 12/07/2019: 55; 01/11/2020: 61; 02/21/2020:65    Time 6    Period Weeks    Status Achieved      PT LONG TERM GOAL #3   Title Patient will have a worst pain of 2/10 to indicate significant improvement with pain and spasms and allow for performance of work related activities    Baseline 8/10; 12/07/2019: 8/10; 01/11/2020: 6/10; 02/22/2020: 5/10; 04/11/2020: 5/10; 05/01/2020: 4/10; 06/27/2020: 4/10    Time 6    Period Weeks    Status On-going      PT LONG TERM GOAL #4   Title Patient will be able return to occupational tasks without signifcant difficulty and pain to better return to job related duties    Baseline not currently working secondary to pain; 12/07/2019: Increased pain during the day at work; 01/11/2020: Increased pain with migranes 1x-2x week limiting occupational tasks on onset.; 02/22/2020: 1 x week (on mondays); 04/11/2020 1 x week; 12.8.2021: 1xweek; 06/27/2020: 1xweek    Time 6    Period Weeks    Status On-going                 Plan - 07/31/20 0840    Clinical Impression Statement Patient demonstrates improvement with ability to perform greater amount of  cervical based exercises. Also focused less on scapular stabilization as to not irritate wrist pain from a potential ganglion cyst. Will continue to focus on improving cervical strenghtening and patient willbenefit from further skilled therapy to return to prior level of function.    Personal Factors and Comorbidities Comorbidity 2    Comorbidities Chronic HA    Examination-Activity Limitations Carry;Sit    Examination-Participation Restrictions Contractor    Stability/Clinical Decision  Making Evolving/Moderate complexity    Rehab Potential Good    PT Frequency 2x / week    PT Duration 6 weeks    PT Treatment/Interventions Joint Manipulations;Spinal Manipulations;Dry needling;Passive range of motion;Patient/family education;Manual techniques;Therapeutic exercise;Neuromuscular re-education;Canalith Repostioning;Electrical Stimulation;Moist Heat    PT Next Visit Plan Add in pec stretch for improved shoulder/scapular mobility, progress DNF endurance if needed    PT Home Exercise Plan See education section    Consulted and Agree with Plan of Care Patient           Patient will benefit from skilled therapeutic intervention in order to improve the following deficits and impairments:  Pain,Postural dysfunction,Increased muscle spasms,Increased fascial restricitons,Hypomobility,Decreased strength,Decreased range of motion,Decreased mobility,Decreased coordination,Decreased endurance,Decreased activity tolerance  Visit Diagnosis: Cramp and spasm  Cervicalgia     Problem List Patient Active Problem List   Diagnosis Date Noted  . Traumatic ecchymosis of right lower leg 03/23/2020  . Hematoma of right lower extremity 03/23/2020  . Nausea 08/21/2019  . Abdominal bloating 08/21/2019  . History of COVID-19 06/24/2019  . URI (upper respiratory infection) 08/26/2018  . Thyroid nodule 02/27/2018  . Intractable migraine with aura with status migrainosus 05/14/2017  . Stress 01/10/2017  .  DUB (dysfunctional uterine bleeding) 07/16/2016  . Low back pain 04/30/2015  . Fatigue 02/08/2015  . Skin lesion of breast 08/06/2014  . Health care maintenance 08/06/2014  . Nephrolithiasis 02/11/2013  . Migraine 02/11/2013  . History of frequent urinary tract infections 02/11/2013  . Syncope 02/11/2013  . IBS (irritable bowel syndrome) 02/11/2013  . Anemia 02/11/2013  . Vitamin D deficiency 02/11/2013    Myrene Galas, PT DPT 07/31/2020, 9:08 AM  [place] [facility] PHYSICAL AND SPORTS MEDICINE [address], [place], [address] Phone: [phone]   Fax:  [phone]  Name: KELEE CUNNINGHAM MRN: [id number] Date of Birth: [birth year]

## 2020-08-07 ENCOUNTER — Ambulatory Visit: Payer: BC Managed Care – PPO

## 2020-08-07 ENCOUNTER — Other Ambulatory Visit: Payer: Self-pay

## 2020-08-07 DIAGNOSIS — M542 Cervicalgia: Secondary | ICD-10-CM

## 2020-08-07 DIAGNOSIS — R252 Cramp and spasm: Secondary | ICD-10-CM

## 2020-08-08 NOTE — Therapy (Signed)
[facility]ICINE [address], [place], [address] Phone: [phone]   Fax:  [phone]  Physical Therapy Treatment  Patient Details  Name: Tina Fleming MRN: [id number] Date of Birth: 20-May-1981 Referring Provider (PT): Lorin Picket MD   Encounter Date: 08/07/2020   PT End of Session - 08/07/20 1839    Visit Number 37    Number of Visits 42    Date for PT Re-Evaluation 08/21/20    PT Start Time 1815    PT Stop Time 1845    PT Time Calculation (min) 30 min    Activity Tolerance No increased pain;Patient tolerated treatment well    Behavior During Therapy [facility] for tasks assessed/performed           Past Medical History:  Diagnosis Date  . Abnormal EKG    no cardiology follow  up was needed  . Anemia    iron suppliments in past-not currently  . Anemia   . Heart murmur    asymtomatic  . History of kidney stones   . IBS (irritable bowel syndrome)   . Migraine   . Nephrolithiasis   . Obesity   . PONV (postoperative nausea and vomiting)    prolonged sedation  . Vitamin D deficiency     Past Surgical History:  Procedure Laterality Date  . APPENDECTOMY  03/21/12   laproscopic appy  . DILATATION & CURETTAGE/HYSTEROSCOPY WITH MYOSURE N/A 03/20/2016   Procedure: DILATATION & CURETTAGE/HYSTEROSCOPY WITH MYOSURE;  Surgeon: Maxie Better, MD;  Location: [facility];  Service: Gynecology;  Laterality: N/A;  45 min. requested  . KNEE ARTHROSCOPY  04/24/2011   Procedure: ARTHROSCOPY KNEE;  Surgeon: Javier Docker;  Location: [facility];  Service: Orthopedics;  Laterality: Right;  /Arthroscopy with debridement, right knee  . LAPAROSCOPIC APPENDECTOMY  03/21/2012   Procedure: APPENDECTOMY LAPAROSCOPIC;  Surgeon: Clovis Pu. Cornett, MD;  Location: [facility];  Service: General;  Laterality: N/A;  . LITHOTRIPSY    . URETHRAL STRICTURE DILATATION     as a child    There were no vitals filed for this  visit.   Subjective Assessment - 08/07/20 1817    Subjective Patient states she had one HA on Tuesday per usual. Patient reports she has not been sleeping well.    Pertinent History Patient reports increased neck pain that began four years prior. Patient states she also has a history of head aches which has been impacting her ability to work and function overall. Patient states increased pain with performing head movements, most specifically rotation and performing cervical flexion. Patient states she has been out of work secondary to increased neck pain and head aches and this is the case until the week of June 14th 2021. Patient states she has been using ice with minimal improvement and managing with pain with pain medications where necessary. Increase in pain 4 months prior without moi. Patient states she had recently receive a occipital nerve block which helped minimally with her symptoms. Patient states she would like to decrease her pain overall so she can return to work.    Limitations Lifting    Diagnostic tests MRI, Xray    Patient Stated Goals Decrease pain    Currently in Pain? No/denies    Pain Onset More than a month ago                TREATMENT Therapeutic Exercise Cervical extension against ball with retraction - x20  Quadruped  with cervical extension and rotation - x20 Quadruped with cervical extension - x20 Standing cervical extension with towel - x20 SNAGS cervical rotation - x20 SNAGS cervical extension - x20 Performed exercises to address cervical strength and limitations    PT Education - 08/08/20 0827    Education Details form/technique with exercise    Person(s) Educated Patient    Methods Explanation;Demonstration    Comprehension Verbalized understanding;Returned demonstration            PT Short Term Goals - 05/15/20 1745      PT SHORT TERM GOAL #1   Title Patient will be independent with HEP to continue benefits of therapy after discharge.     Baseline Dependent with HEP; 05/15/2020: independent with HEP    Time 2    Period Weeks    Status Achieved    Target Date 11/08/19             PT Long Term Goals - 06/27/20 1445      PT LONG TERM GOAL #1   Title Patinet will be independent with HEP to continue benefits of therapy until after discharge.    Baseline Dependent with HEP; 12/07/2019: mod A for exercise progression; 01/11/2020: independent    Time 6    Period Weeks    Status Achieved      PT LONG TERM GOAL #2   Title Patient will have a significant improvement in her FOTO score to indicate improvement with cervical function and stability.    Baseline FOTO: Assess NV; 10/26/2019: 53; 12/07/2019: 55; 01/11/2020: 61; 02/21/2020:65    Time 6    Period Weeks    Status Achieved      PT LONG TERM GOAL #3   Title Patient will have a worst pain of 2/10 to indicate significant improvement with pain and spasms and allow for performance of work related activities    Baseline 8/10; 12/07/2019: 8/10; 01/11/2020: 6/10; 02/22/2020: 5/10; 04/11/2020: 5/10; 05/01/2020: 4/10; 06/27/2020: 4/10    Time 6    Period Weeks    Status On-going      PT LONG TERM GOAL #4   Title Patient will be able return to occupational tasks without signifcant difficulty and pain to better return to job related duties    Baseline not currently working secondary to pain; 12/07/2019: Increased pain during the day at work; 01/11/2020: Increased pain with migranes 1x-2x week limiting occupational tasks on onset.; 02/22/2020: 1 x week (on mondays); 04/11/2020 1 x week; 12.8.2021: 1xweek; 06/27/2020: 1xweek    Time 6    Period Weeks    Status On-going                 Plan - 08/08/20 2694    Clinical Impression Statement Focused on exercises to be performed at home during today's session. Discussed future sessions as patient has made recent limited progress as she continues to have 1 HA per week. Continued to focus on improving cervical extension and retraction strength  with exercises. Patient will benefit from further skilled therapy to return to prior level of function.    Personal Factors and Comorbidities Comorbidity 2    Comorbidities Chronic HA    Examination-Activity Limitations Carry;Sit    Examination-Participation Restrictions Contractor    Stability/Clinical Decision Making Evolving/Moderate complexity    Rehab Potential Good    PT Frequency 2x / week    PT Duration 6 weeks    PT Treatment/Interventions Joint Manipulations;Spinal Manipulations;Dry needling;Passive range of motion;Patient/family education;Manual techniques;Therapeutic  exercise;Neuromuscular re-education;Canalith Repostioning;Electrical Stimulation;Moist Heat    PT Next Visit Plan Add in pec stretch for improved shoulder/scapular mobility, progress DNF endurance if needed    PT Home Exercise Plan See education section    Consulted and Agree with Plan of Care Patient           Patient will benefit from skilled therapeutic intervention in order to improve the following deficits and impairments:  Pain,Postural dysfunction,Increased muscle spasms,Increased fascial restricitons,Hypomobility,Decreased strength,Decreased range of motion,Decreased mobility,Decreased coordination,Decreased endurance,Decreased activity tolerance  Visit Diagnosis: Cervicalgia  Cramp and spasm     Problem List Patient Active Problem List   Diagnosis Date Noted  . Traumatic ecchymosis of right lower leg 03/23/2020  . Hematoma of right lower extremity 03/23/2020  . Nausea 08/21/2019  . Abdominal bloating 08/21/2019  . History of COVID-19 06/24/2019  . URI (upper respiratory infection) 08/26/2018  . Thyroid nodule 02/27/2018  . Intractable migraine with aura with status migrainosus 05/14/2017  . Stress 01/10/2017  . DUB (dysfunctional uterine bleeding) 07/16/2016  . Low back pain 04/30/2015  . Fatigue 02/08/2015  . Skin lesion of breast 08/06/2014  . Health care maintenance 08/06/2014  .  Nephrolithiasis 02/11/2013  . Migraine 02/11/2013  . History of frequent urinary tract infections 02/11/2013  . Syncope 02/11/2013  . IBS (irritable bowel syndrome) 02/11/2013  . Anemia 02/11/2013  . Vitamin D deficiency 02/11/2013    Myrene Galas, PT DPT 08/08/2020, 8:30 AM   [facility] PHYS[facility] SPORTS MEDICINE [address] [place], [address] Phone: [phone]   Fax:  [phone]  Name: Tina Fleming MRN: [id number] Date of Birth: [birth year]

## 2020-08-21 ENCOUNTER — Other Ambulatory Visit: Payer: Self-pay

## 2020-08-21 ENCOUNTER — Ambulatory Visit: Payer: BC Managed Care – PPO

## 2020-08-21 DIAGNOSIS — R252 Cramp and spasm: Secondary | ICD-10-CM | POA: Diagnosis not present

## 2020-08-21 DIAGNOSIS — M542 Cervicalgia: Secondary | ICD-10-CM | POA: Diagnosis not present

## 2020-08-22 NOTE — Therapy (Signed)
[facility] [address] [place], [address] Phone: [phone]   Fax:  [phone]  Physical Therapy Treatment  Progress Report: 08/14/2020 - 08/21/2020 Patient Details  Name: Tina Fleming MRN: [id number] Date of Birth: 1981/04/21 Referring Provider (PT): Lorin Picket MD   Encounter Date: 08/21/2020   PT End of Session - 08/21/20 1736    Visit Number 38    Number of Visits 42    Date for PT Re-Evaluation 08/21/20    PT Start Time 1730    PT Stop Time 1800    PT Time Calculation (min) 30 min    Activity Tolerance No increased pain;Patient tolerated treatment well    Behavior During Therapy [facility] for tasks assessed/performed           Past Medical History:  Diagnosis Date  . Abnormal EKG    no cardiology follow  up was needed  . Anemia    iron suppliments in past-not currently  . Anemia   . Heart murmur    asymtomatic  . History of kidney stones   . IBS (irritable bowel syndrome)   . Migraine   . Nephrolithiasis   . Obesity   . PONV (postoperative nausea and vomiting)    prolonged sedation  . Vitamin D deficiency     Past Surgical History:  Procedure Laterality Date  . APPENDECTOMY  03/21/12   laproscopic appy  . DILATATION & CURETTAGE/HYSTEROSCOPY WITH MYOSURE N/A 03/20/2016   Procedure: DILATATION & CURETTAGE/HYSTEROSCOPY WITH MYOSURE;  Surgeon: Maxie Better, MD;  Location: [facility];  Service: Gynecology;  Laterality: N/A;  45 min. requested  . KNEE ARTHROSCOPY  04/24/2011   Procedure: ARTHROSCOPY KNEE;  Surgeon: Javier Docker;  Location: [facility];  Service: Orthopedics;  Laterality: Right;  /Arthroscopy with debridement, right knee  . LAPAROSCOPIC APPENDECTOMY  03/21/2012   Procedure: APPENDECTOMY LAPAROSCOPIC;  Surgeon: Clovis Pu. Cornett, MD;  Location: [facility];  Service: General;  Laterality: N/A;  . LITHOTRIPSY    . URETHRAL STRICTURE DILATATION     as a child     There were no vitals filed for this visit.   Subjective Assessment - 08/21/20 1728    Subjective Patient states one HA over the past 2 weeks. Patient states she has been sleeping better.    Pertinent History Patient reports increased neck pain that began four years prior. Patient states she also has a history of head aches which has been impacting her ability to work and function overall. Patient states increased pain with performing head movements, most specifically rotation and performing cervical flexion. Patient states she has been out of work secondary to increased neck pain and head aches and this is the case until the week of June 14th 2021. Patient states she has been using ice with minimal improvement and managing with pain with pain medications where necessary. Increase in pain 4 months prior without moi. Patient states she had recently receive a occipital nerve block which helped minimally with her symptoms. Patient states she would like to decrease her pain overall so she can return to work.    Limitations Lifting    Diagnostic tests MRI, Xray    Patient Stated Goals Decrease pain    Currently in Pain? No/denies    Pain Onset More than a month ago               TREATMENT Therapeutic Exercise SNAGS cervical rotation - x20 SNAGS cervical extension -  x20 Cervical side bending - x 20 against towel Wall angels in standing - x 20  Scapular retraction in standing with BTB - x 20  Archery in standing with BTB - x 20  Performed exercises to address cervical strength and limitations    PT Education - 08/21/20 1731    Education Details form/technique with exercise    Person(s) Educated Patient    Methods Explanation;Demonstration    Comprehension Verbalized understanding;Returned demonstration            PT Short Term Goals - 05/15/20 1745      PT SHORT TERM GOAL #1   Title Patient will be independent with HEP to continue benefits of therapy after discharge.     Baseline Dependent with HEP; 05/15/2020: independent with HEP    Time 2    Period Weeks    Status Achieved    Target Date 11/08/19             PT Long Term Goals - 06/27/20 1445      PT LONG TERM GOAL #1   Title Patinet will be independent with HEP to continue benefits of therapy until after discharge.    Baseline Dependent with HEP; 12/07/2019: mod A for exercise progression; 01/11/2020: independent    Time 6    Period Weeks    Status Achieved      PT LONG TERM GOAL #2   Title Patient will have a significant improvement in her FOTO score to indicate improvement with cervical function and stability.    Baseline FOTO: Assess NV; 10/26/2019: 53; 12/07/2019: 55; 01/11/2020: 61; 02/21/2020:65    Time 6    Period Weeks    Status Achieved      PT LONG TERM GOAL #3   Title Patient will have a worst pain of 2/10 to indicate significant improvement with pain and spasms and allow for performance of work related activities    Baseline 8/10; 12/07/2019: 8/10; 01/11/2020: 6/10; 02/22/2020: 5/10; 04/11/2020: 5/10; 05/01/2020: 4/10; 06/27/2020: 4/10    Time 6    Period Weeks    Status On-going      PT LONG TERM GOAL #4   Title Patient will be able return to occupational tasks without signifcant difficulty and pain to better return to job related duties    Baseline not currently working secondary to pain; 12/07/2019: Increased pain during the day at work; 01/11/2020: Increased pain with migranes 1x-2x week limiting occupational tasks on onset.; 02/22/2020: 1 x week (on mondays); 04/11/2020 1 x week; 12.8.2021: 1xweek; 06/27/2020: 1xweek    Time 6    Period Weeks    Status On-going                 Plan - 08/22/20 7035    Clinical Impression Statement Patient has made almost all long term goals the only limiting factor is the episodes of headaches which are anywhere from 1xweek to 1x every two weeks. Reviewed exercises to be performed at home and patient is to be discharged from physical therapy.     Personal Factors and Comorbidities Comorbidity 2    Comorbidities Chronic HA    Examination-Activity Limitations Carry;Sit    Examination-Participation Restrictions Contractor    Stability/Clinical Decision Making Evolving/Moderate complexity    Rehab Potential Good    PT Frequency 2x / week    PT Duration 6 weeks    PT Treatment/Interventions Joint Manipulations;Spinal Manipulations;Dry needling;Passive range of motion;Patient/family education;Manual techniques;Therapeutic exercise;Neuromuscular re-education;Canalith Repostioning;Electrical Stimulation;Moist Heat  PT Next Visit Plan Add in pec stretch for improved shoulder/scapular mobility, progress DNF endurance if needed    PT Home Exercise Plan See education section    Consulted and Agree with Plan of Care Patient           Patient will benefit from skilled therapeutic intervention in order to improve the following deficits and impairments:  Pain,Postural dysfunction,Increased muscle spasms,Increased fascial restricitons,Hypomobility,Decreased strength,Decreased range of motion,Decreased mobility,Decreased coordination,Decreased endurance,Decreased activity tolerance  Visit Diagnosis: Cervicalgia  Cramp and spasm     Problem List Patient Active Problem List   Diagnosis Date Noted  . Traumatic ecchymosis of right lower leg 03/23/2020  . Hematoma of right lower extremity 03/23/2020  . Nausea 08/21/2019  . Abdominal bloating 08/21/2019  . History of COVID-19 06/24/2019  . URI (upper respiratory infection) 08/26/2018  . Thyroid nodule 02/27/2018  . Intractable migraine with aura with status migrainosus 05/14/2017  . Stress 01/10/2017  . DUB (dysfunctional uterine bleeding) 07/16/2016  . Low back pain 04/30/2015  . Fatigue 02/08/2015  . Skin lesion of breast 08/06/2014  . Health care maintenance 08/06/2014  . Nephrolithiasis 02/11/2013  . Migraine 02/11/2013  . History of frequent urinary tract infections  02/11/2013  . Syncope 02/11/2013  . IBS (irritable bowel syndrome) 02/11/2013  . Anemia 02/11/2013  . Vitamin D deficiency 02/11/2013    Myrene Galas, PT DPT 08/22/2020, 8:47 AM  [facility] PHYSICAL AND SPORTS MEDICINE [address], [place], [address] Phone: [phone]   Fax:  [phone]  Name: Tina Fleming MRN: [id number] Date of Birth: 1981/02/04

## 2020-11-10 ENCOUNTER — Ambulatory Visit
Admission: RE | Admit: 2020-11-10 | Discharge: 2020-11-10 | Disposition: A | Discharge status: Home / Self Care | Payer: BC Managed Care – PPO | Source: Ambulatory Visit | Attending: Student | Admitting: Student

## 2020-11-10 ENCOUNTER — Other Ambulatory Visit: Payer: Self-pay

## 2020-11-10 VITALS — BP 130/81 | HR 66 | Temp 98.3°F | Resp 18 | Ht 65.0 in | Wt 234.0 lb

## 2020-11-10 DIAGNOSIS — N939 Abnormal uterine and vaginal bleeding, unspecified: Secondary | ICD-10-CM

## 2020-11-10 MED ORDER — MEGESTROL ACETATE 40 MG PO TABS: 40.0000 mg | ORAL_TABLET | Freq: Every day | ORAL | 0 refills | Status: DC

## 2020-11-10 NOTE — ED Triage Notes (Signed)
Pt c/o irregular menstrual bleeding. Pt states she has had some spotting since the end of May. She reports she has had normal cycles since this. She reports she started a cycle 10/31/20 and has been continuously bleeding since then. Pt reports she is using 3 overnight pads in 24 hours. Pt states she does have a history of uterine polyps. She does not currently have a Gyn and states she cannot see her PCP any time soon. Pt is asking for Megace rx.

## 2020-11-10 NOTE — Discharge Instructions (Addendum)
-  Take medication as prescribed. -Proceed to ER if symptoms worsen or fail to improve or if you begin to experience any abdominal pain.

## 2020-11-10 NOTE — ED Provider Notes (Signed)
[facility]    CSN: [id number] Arrival date & time: 11/10/20  0841      History   Chief Complaint Chief Complaint  Patient presents with   Vaginal Bleeding   HPI Tina Fleming is a 40 y.o. female who presents today for evaluation of increased uterine bleeding.  The patient reported noticing spotting in early June and.  Beginning on June 9.  The patient states that she had normal flow for the first several days however over the past several days she reports increased blood flow and passing of clots.  She states that she had the same symptoms in 2017.  At the time the patient was diagnosed with uterine polyps and did undergo a D&C.  She was treated also with megestrol acetate.  She presents today reporting no dizziness, lightheadedness.  No chest pain or shortness of breath.  She reports no increased abdominal discomfort or uterine discomfort.  She reports some mild cramps.  She denies any significant pain.  She denies any low back pain.  She denies any urinary symptoms such as increased frequency or odor.   Vaginal Bleeding  Past Medical History:  Diagnosis Date   Abnormal EKG    no cardiology follow  up was needed   Anemia    iron suppliments in past-not currently   Anemia    Heart murmur    asymtomatic   History of kidney stones    IBS (irritable bowel syndrome)    Migraine    Nephrolithiasis    Obesity    PONV (postoperative nausea and vomiting)    prolonged sedation   Vitamin D deficiency     Patient Active Problem List   Diagnosis Date Noted   Traumatic ecchymosis of right lower leg 03/23/2020   Hematoma of right lower extremity 03/23/2020   Nausea 08/21/2019   Abdominal bloating 08/21/2019   History of COVID-19 06/24/2019   URI (upper respiratory infection) 08/26/2018   Thyroid nodule 02/27/2018   Intractable migraine with aura with status migrainosus 05/14/2017   Stress 01/10/2017   DUB (dysfunctional uterine bleeding) 07/16/2016   Low back  pain 04/30/2015   Fatigue 02/08/2015   Skin lesion of breast 08/06/2014   Health care maintenance 08/06/2014   Nephrolithiasis 02/11/2013   Migraine 02/11/2013   History of frequent urinary tract infections 02/11/2013   Syncope 02/11/2013   IBS (irritable bowel syndrome) 02/11/2013   Anemia 02/11/2013   Vitamin D deficiency 02/11/2013    Past Surgical History:  Procedure Laterality Date   APPENDECTOMY  03/21/12   laproscopic appy   DILATATION & CURETTAGE/HYSTEROSCOPY WITH MYOSURE N/A 03/20/2016   Procedure: DILATATION & CURETTAGE/HYSTEROSCOPY WITH MYOSURE;  Surgeon: Maxie Better, MD;  Location: [facility];  Service: Gynecology;  Laterality: N/A;  45 min. requested   KNEE ARTHROSCOPY  04/24/2011   Procedure: ARTHROSCOPY KNEE;  Surgeon: Javier Docker;  Location: [facility];  Service: Orthopedics;  Laterality: Right;  /Arthroscopy with debridement, right knee   LAPAROSCOPIC APPENDECTOMY  03/21/2012   Procedure: APPENDECTOMY LAPAROSCOPIC;  Surgeon: Clovis Pu. Cornett, MD;  Location: [facility];  Service: General;  Laterality: N/A;   LITHOTRIPSY     URETHRAL STRICTURE DILATATION     as a child    OB History   No obstetric history on file.      Home Medications    Prior to Admission medications   Medication Sig Start Date End Date Taking? Authorizing Provider  baclofen (LIORESAL) 10 MG tablet Take by  mouth. 03/19/20 03/19/21 Yes [provider]  Cholecalciferol (VITAMIN D PO) Take 1 capsule by mouth every morning.   Yes [provider]  iron polysaccharides (FERREX 150) 150 MG capsule TAKE 1 CAPSULE (150 MG TOTAL) BY MOUTH 2 TIMES DAILY. 03/23/20  Yes Dale [place], MD  NURTEC 75 MG TBDP Take 1 tablet by mouth daily. 01/22/20  Yes [provider]  ondansetron (ZOFRAN ODT) 4 MG disintegrating tablet Take 1 tablet (4 mg total) by mouth every 12 (twelve) hours as needed for nausea or vomiting. 06/22/17  Yes Dale , MD  EMGALITY 120  MG/ML SOAJ SMARTSIG:1 Pre-Filled Pen Syringe SUB-Q Every 4 Weeks 10/17/19   [provider]    Family History Family History  Problem Relation Age of Onset   Cancer Mother        pancreatic   Arthritis Mother    Hyperlipidemia Mother    Hypertension Mother    Cancer Father        skin   Arthritis Father    Hyperlipidemia Father    Hypertension Father    Diabetes Father    Ankylosing spondylitis Brother    Spondylitis Brother    Breast cancer Paternal Aunt        65-60   Breast cancer Paternal Aunt        51-60's    Social History Social History   Tobacco Use   Smoking status: Never   Smokeless tobacco: Never  Vaping Use   Vaping Use: Never used  Substance Use Topics   Alcohol use: No    Alcohol/week: 0.0 standard drinks   Drug use: No     Allergies   Sulfa antibiotics   Review of Systems Review of Systems  Constitutional: Negative.   HENT: Negative.    Eyes: Negative.   Respiratory: Negative.    Cardiovascular: Negative.   Gastrointestinal: Negative.   Endocrine: Negative.   Genitourinary:  Positive for vaginal bleeding. Negative for flank pain, pelvic pain and vaginal pain.  Musculoskeletal: Negative.   Allergic/Immunologic: Negative.   Neurological: Negative.   Hematological: Negative.   Psychiatric/Behavioral: Negative.      Physical Exam Triage Vital Signs ED Triage Vitals  Enc Vitals Group     BP 11/10/20 0912 130/81     Pulse Rate 11/10/20 0912 66     Resp 11/10/20 0912 18     Temp 11/10/20 0912 98.3 F (36.8 C)     Temp Source 11/10/20 0912 Oral     SpO2 11/10/20 0912 100 %     Weight 11/10/20 0910 234 lb (106.1 kg)     Height 11/10/20 0910 5\' 5"  (1.651 m)     Head Circumference --      Peak Flow --      Pain Score 11/10/20 0909 0     Pain Loc --      Pain Edu? --      Excl. in GC? --    No data found.  Updated Vital Signs BP 130/81 (BP Location: Left Arm)   Pulse 66   Temp 98.3 F (36.8 C) (Oral)   Resp 18   Ht  5\' 5"  (1.651 m)   Wt 234 lb (106.1 kg)   LMP 10/31/2020   SpO2 100%   BMI 38.94 kg/m   Visual Acuity Right Eye Distance:   Left Eye Distance:   Bilateral Distance:    Right Eye Near:   Left Eye Near:    Bilateral Near:  Physical Exam Constitutional:      General: She is not in acute distress.    Appearance: Normal appearance. She is not ill-appearing, toxic-appearing or diaphoretic.  Pulmonary:     Effort: Pulmonary effort is normal.     Breath sounds: Normal breath sounds. No wheezing, rhonchi or rales.  Abdominal:     General: Abdomen is flat. Bowel sounds are normal.     Palpations: Abdomen is soft.     Tenderness: There is no abdominal tenderness. There is no guarding.     Comments: No tenderness with palpation over the suprapubic region.  No adnexal tenderness palpated.  Neurological:     Mental Status: She is alert.     UC Treatments / Results  Labs (all labs ordered are listed, but only abnormal results are displayed) Labs Reviewed - No data to display  EKG   Radiology No results found.  Procedures Procedures (including critical care time)  Medications Ordered in UC Medications - No data to display  Initial Impression / Assessment and Plan / UC Course  I have reviewed the triage vital signs and the nursing notes.  Pertinent labs & imaging results that were available during my care of the patient were reviewed by me and considered in my medical decision making (see chart for details).     1.  Discussed treatment options with the patient today. 2.  The patient is experiencing increased uterine bleeding but denies any dizziness, shortness of breath, no vaginal pain or stomach pain.  Patient is stable, vitals are stable.  She is not ill-appearing today. 3.  Spoke with on-call provider Dr. Dalbert Garnet and discussed patient.  Dr. Dalbert Garnet is comfortable with prescribing megestrol acetate for the patient today and follow-up with OB/GYN office. 4.  The patient  was given prescription of megestrol acetate at today's visit.  She was instructed on urgent follow-up at the emergency room if she develops any worsening pain, dizziness or lightheadedness. Final Clinical Impressions(s) / UC Diagnoses   Final diagnoses:  None   Discharge Instructions   None    ED Prescriptions   None    PDMP not reviewed this encounter.   Anson [place], PA-C 11/10/20 1133

## 2020-11-11 ENCOUNTER — Telehealth: Payer: Self-pay | Admitting: Internal Medicine

## 2020-11-11 ENCOUNTER — Other Ambulatory Visit (INDEPENDENT_AMBULATORY_CARE_PROVIDER_SITE_OTHER): Payer: BC Managed Care – PPO

## 2020-11-11 DIAGNOSIS — D649 Anemia, unspecified: Secondary | ICD-10-CM | POA: Diagnosis not present

## 2020-11-11 NOTE — Telephone Encounter (Signed)
Ok to order CBC for her?

## 2020-11-11 NOTE — Addendum Note (Signed)
Addended by: Larry Sierras on: 11/11/2020 01:43 PM   Modules accepted: Orders

## 2020-11-11 NOTE — Telephone Encounter (Signed)
PT called in to advise that she was in the urgent care for heavy bleeding. The Gyneclogist ( she is not established with yet) advise her that she would like for her to have a CBC order by her PCP to have done before Thursday that way she can have her results before Thursday.

## 2020-11-11 NOTE — Telephone Encounter (Signed)
Ok to order and schedule cbc and ferritin.

## 2020-11-11 NOTE — Telephone Encounter (Signed)
Patient scheduled for labs this afternoon.

## 2020-11-12 LAB — CBC WITH DIFFERENTIAL/PLATELET
Basophils Absolute: 0 10*3/uL (ref 0.0–0.1)
Basophils Relative: 0.5 % (ref 0.0–3.0)
Eosinophils Absolute: 0.1 10*3/uL (ref 0.0–0.7)
Eosinophils Relative: 1.3 % (ref 0.0–5.0)
HCT: 31.2 % — ABNORMAL LOW (ref 36.0–46.0)
Hemoglobin: 9.6 g/dL — ABNORMAL LOW (ref 12.0–15.0)
Lymphocytes Relative: 24.2 % (ref 12.0–46.0)
Lymphs Abs: 2.1 10*3/uL (ref 0.7–4.0)
MCHC: 30.8 g/dL (ref 30.0–36.0)
MCV: 70.4 fl — ABNORMAL LOW (ref 78.0–100.0)
Monocytes Absolute: 0.5 10*3/uL (ref 0.1–1.0)
Monocytes Relative: 6.1 % (ref 3.0–12.0)
Neutro Abs: 6 10*3/uL (ref 1.4–7.7)
Neutrophils Relative %: 67.9 % (ref 43.0–77.0)
Platelets: 411 10*3/uL — ABNORMAL HIGH (ref 150.0–400.0)
RBC: 4.43 Mil/uL (ref 3.87–5.11)
RDW: 20.9 % — ABNORMAL HIGH (ref 11.5–15.5)
WBC: 8.8 10*3/uL (ref 4.0–10.5)

## 2020-11-12 LAB — FERRITIN: Ferritin: 3.2 ng/mL — ABNORMAL LOW (ref 10.0–291.0)

## 2020-11-14 ENCOUNTER — Encounter: Payer: Self-pay | Admitting: Internal Medicine

## 2020-11-14 NOTE — Telephone Encounter (Signed)
You had informed me that you were seen in urgent care for heavy bleeding and requested a follow up cbc.  Your hemoglobin and iron stores are low.  Do you have an appointment with GYN?  We will need to follow up regarding your bleeding, low hemoglobin and low iron.  Are you taking any iron?  Just let meknow.    Dr Lorin Picket  Written by Dale [place], MD on 11/14/2020  5:34 AM EDT

## 2020-11-27 DIAGNOSIS — Z01419 Encounter for gynecological examination (general) (routine) without abnormal findings: Secondary | ICD-10-CM | POA: Diagnosis not present

## 2020-11-28 NOTE — Telephone Encounter (Signed)
Changed.

## 2020-11-28 NOTE — Telephone Encounter (Signed)
Please change her scheduled appt to a recheck appt instead of a physical.  Thanks.

## 2020-12-02 ENCOUNTER — Encounter (HOSPITAL_BASED_OUTPATIENT_CLINIC_OR_DEPARTMENT_OTHER): Payer: Self-pay | Admitting: Obstetrics and Gynecology

## 2020-12-02 ENCOUNTER — Other Ambulatory Visit: Payer: Self-pay | Admitting: Obstetrics and Gynecology

## 2020-12-03 ENCOUNTER — Encounter (HOSPITAL_BASED_OUTPATIENT_CLINIC_OR_DEPARTMENT_OTHER): Payer: Self-pay | Admitting: Obstetrics and Gynecology

## 2020-12-03 ENCOUNTER — Other Ambulatory Visit: Payer: Self-pay

## 2020-12-03 NOTE — Progress Notes (Signed)
Spoke w/ via phone for pre-op interview--- Pt Lab needs dos----  CB, Urine preg, t&s             Lab results------ no COVID test -----patient states asymptomatic no test needed Arrive at ------- 0800 on 12-06-2020 NPO after MN NO Solid Food.  Clear liquids from MN until--- 0700 Med rec completed Medications to take morning of surgery ----- may take nurtec if needed Diabetic medication ----- n/a Patient instructed no nail polish to be worn day of surgery Patient instructed to bring photo id and insurance card day of surgery Patient aware to have Driver (ride ) / caregiver for 24 hours after surgery --sister-n-law, French Ana Edrington Patient Special Instructions ----- n/a Pre-Op special Istructions ----- n/a Patient verbalized understanding of instructions that were given at this phone interview. Patient denies shortness of breath, chest pain, fever, cough at this phone interview.

## 2020-12-05 NOTE — Anesthesia Preprocedure Evaluation (Addendum)
Anesthesia Evaluation  Patient identified by MRN, date of birth, ID band Patient awake    Reviewed: Allergy & Precautions, NPO status , Patient's Chart, lab work & pertinent test results  History of Anesthesia Complications Negative for: history of anesthetic complications  Airway Mallampati: I  TM Distance: >3 FB Neck ROM: Full    Dental no notable dental hx.    Pulmonary neg pulmonary ROS,    Pulmonary exam normal        Cardiovascular negative cardio ROS Normal cardiovascular exam     Neuro/Psych  Headaches,    GI/Hepatic negative GI ROS, Neg liver ROS,   Endo/Other  BMI 39  Renal/GU   negative genitourinary   Musculoskeletal negative musculoskeletal ROS (+)   Abdominal   Peds  Hematology  (+) anemia ,   Anesthesia Other Findings Day of surgery medications reviewed with patient.  Reproductive/Obstetrics DUB                            Anesthesia Physical Anesthesia Plan  ASA: 2  Anesthesia Plan: General   Post-op Pain Management:    Induction: Intravenous  PONV Risk Score and Plan: 3 and Treatment may vary due to age or medical condition, Ondansetron, Dexamethasone, Midazolam and Scopolamine patch - Pre-op  Airway Management Planned: LMA  Additional Equipment: None  Intra-op Plan:   Post-operative Plan: Extubation in OR  Informed Consent: I have reviewed the patients History and Physical, chart, labs and discussed the procedure including the risks, benefits and alternatives for the proposed anesthesia with the patient or authorized representative who has indicated his/her understanding and acceptance.     Dental advisory given  Plan Discussed with: CRNA  Anesthesia Plan Comments:        Anesthesia Quick Evaluation

## 2020-12-06 ENCOUNTER — Ambulatory Visit (HOSPITAL_BASED_OUTPATIENT_CLINIC_OR_DEPARTMENT_OTHER)
Admission: RE | Admit: 2020-12-06 | Discharge: 2020-12-06 | Disposition: A | Discharge status: Home / Self Care | Payer: BC Managed Care – PPO | Attending: Obstetrics and Gynecology | Admitting: Obstetrics and Gynecology

## 2020-12-06 ENCOUNTER — Ambulatory Visit (HOSPITAL_BASED_OUTPATIENT_CLINIC_OR_DEPARTMENT_OTHER): Payer: BC Managed Care – PPO | Admitting: Anesthesiology

## 2020-12-06 ENCOUNTER — Encounter (HOSPITAL_BASED_OUTPATIENT_CLINIC_OR_DEPARTMENT_OTHER)
Admission: RE | Disposition: A | Discharge status: Home / Self Care | Payer: Self-pay | Source: Home / Self Care | Attending: Obstetrics and Gynecology

## 2020-12-06 ENCOUNTER — Encounter (HOSPITAL_BASED_OUTPATIENT_CLINIC_OR_DEPARTMENT_OTHER): Payer: Self-pay | Admitting: Obstetrics and Gynecology

## 2020-12-06 DIAGNOSIS — N840 Polyp of corpus uteri: Secondary | ICD-10-CM | POA: Insufficient documentation

## 2020-12-06 DIAGNOSIS — N938 Other specified abnormal uterine and vaginal bleeding: Secondary | ICD-10-CM | POA: Insufficient documentation

## 2020-12-06 DIAGNOSIS — D509 Iron deficiency anemia, unspecified: Secondary | ICD-10-CM | POA: Diagnosis not present

## 2020-12-06 DIAGNOSIS — Z882 Allergy status to sulfonamides status: Secondary | ICD-10-CM | POA: Diagnosis not present

## 2020-12-06 DIAGNOSIS — Z8616 Personal history of COVID-19: Secondary | ICD-10-CM | POA: Diagnosis not present

## 2020-12-06 DIAGNOSIS — E559 Vitamin D deficiency, unspecified: Secondary | ICD-10-CM | POA: Diagnosis not present

## 2020-12-06 DIAGNOSIS — M545 Low back pain, unspecified: Secondary | ICD-10-CM | POA: Diagnosis not present

## 2020-12-06 HISTORY — DX: Nontoxic single thyroid nodule: E04.1

## 2020-12-06 HISTORY — DX: Other complications of anesthesia, initial encounter: T88.59XA

## 2020-12-06 HISTORY — DX: Personal history of other diseases of urinary system: Z87.448

## 2020-12-06 HISTORY — DX: Migraine with aura, intractable, without status migrainosus: G43.119

## 2020-12-06 HISTORY — DX: Other specified abnormal uterine and vaginal bleeding: N93.8

## 2020-12-06 HISTORY — DX: Occipital neuralgia: M54.81

## 2020-12-06 HISTORY — PX: DILATATION & CURETTAGE/HYSTEROSCOPY WITH MYOSURE: SHX6511

## 2020-12-06 HISTORY — DX: Iron deficiency anemia, unspecified: D50.9

## 2020-12-06 LAB — CBC
HCT: 32.5 % — ABNORMAL LOW (ref 36.0–46.0)
Hemoglobin: 9.6 g/dL — ABNORMAL LOW (ref 12.0–15.0)
MCH: 21.2 pg — ABNORMAL LOW (ref 26.0–34.0)
MCHC: 29.5 g/dL — ABNORMAL LOW (ref 30.0–36.0)
MCV: 71.7 fL — ABNORMAL LOW (ref 80.0–100.0)
Platelets: 428 10*3/uL — ABNORMAL HIGH (ref 150–400)
RBC: 4.53 MIL/uL (ref 3.87–5.11)
RDW: 18.8 % — ABNORMAL HIGH (ref 11.5–15.5)
WBC: 8.4 10*3/uL (ref 4.0–10.5)
nRBC: 0 % (ref 0.0–0.2)

## 2020-12-06 LAB — ABO/RH: ABO/RH(D): O NEG

## 2020-12-06 LAB — TYPE AND SCREEN
ABO/RH(D): O NEG
Antibody Screen: NEGATIVE

## 2020-12-06 LAB — POCT PREGNANCY, URINE: Preg Test, Ur: NEGATIVE

## 2020-12-06 SURGERY — DILATATION & CURETTAGE/HYSTEROSCOPY WITH MYOSURE
Anesthesia: General | Site: Abdomen

## 2020-12-06 MED ORDER — SCOPOLAMINE 1 MG/3DAYS TD PT72: 1.0000 | MEDICATED_PATCH | Freq: Once | TRANSDERMAL | Status: DC

## 2020-12-06 MED ORDER — MIDAZOLAM HCL 5 MG/5ML IJ SOLN
INTRAMUSCULAR | Status: DC | PRN
  Administered 2020-12-06: 1 mg via INTRAVENOUS

## 2020-12-06 MED ORDER — FENTANYL CITRATE (PF) 100 MCG/2ML IJ SOLN
INTRAMUSCULAR | Status: DC | PRN
  Administered 2020-12-06 (×2): 50 ug via INTRAVENOUS

## 2020-12-06 MED ORDER — DEXAMETHASONE SODIUM PHOSPHATE 10 MG/ML IJ SOLN
INTRAMUSCULAR | Status: DC | PRN
  Administered 2020-12-06: 10 mg via INTRAVENOUS

## 2020-12-06 MED ORDER — LIDOCAINE HCL (PF) 2 % IJ SOLN
INTRAMUSCULAR | Status: AC
  Filled 2020-12-06: qty 5

## 2020-12-06 MED ORDER — MIDAZOLAM HCL 2 MG/2ML IJ SOLN
INTRAMUSCULAR | Status: AC
  Filled 2020-12-06: qty 2

## 2020-12-06 MED ORDER — FENTANYL CITRATE (PF) 100 MCG/2ML IJ SOLN
INTRAMUSCULAR | Status: AC
  Filled 2020-12-06: qty 2

## 2020-12-06 MED ORDER — OXYCODONE HCL 5 MG PO TABS: 5.0000 mg | ORAL_TABLET | Freq: Once | ORAL | Status: DC | PRN

## 2020-12-06 MED ORDER — LIDOCAINE 2% (20 MG/ML) 5 ML SYRINGE
INTRAMUSCULAR | Status: DC | PRN
  Administered 2020-12-06: 20 mg via INTRAVENOUS
  Administered 2020-12-06: 80 mg via INTRAVENOUS

## 2020-12-06 MED ORDER — KETOROLAC TROMETHAMINE 30 MG/ML IJ SOLN
INTRAMUSCULAR | Status: AC
  Filled 2020-12-06: qty 1

## 2020-12-06 MED ORDER — FENTANYL CITRATE (PF) 100 MCG/2ML IJ SOLN: 25.0000 ug | INTRAMUSCULAR | Status: DC | PRN

## 2020-12-06 MED ORDER — PROPOFOL 10 MG/ML IV BOLUS
INTRAVENOUS | Status: AC
  Filled 2020-12-06: qty 20

## 2020-12-06 MED ORDER — OXYCODONE HCL 5 MG/5ML PO SOLN: 5.0000 mg | Freq: Once | ORAL | Status: DC | PRN

## 2020-12-06 MED ORDER — PROMETHAZINE HCL 25 MG/ML IJ SOLN: 6.2500 mg | INTRAMUSCULAR | Status: DC | PRN

## 2020-12-06 MED ORDER — ONDANSETRON HCL 4 MG/2ML IJ SOLN
INTRAMUSCULAR | Status: AC
  Filled 2020-12-06: qty 2

## 2020-12-06 MED ORDER — ACETAMINOPHEN 500 MG PO TABS
ORAL_TABLET | ORAL | Status: AC
  Filled 2020-12-06: qty 2

## 2020-12-06 MED ORDER — POVIDONE-IODINE 10 % EX SWAB: 2.0000 "application " | Freq: Once | CUTANEOUS | Status: DC

## 2020-12-06 MED ORDER — IBUPROFEN 800 MG PO TABS: 800.0000 mg | ORAL_TABLET | Freq: Three times a day (TID) | ORAL | 5 refills | Status: DC | PRN

## 2020-12-06 MED ORDER — ACETAMINOPHEN 500 MG PO TABS
1000.0000 mg | ORAL_TABLET | Freq: Once | ORAL | Status: AC
  Administered 2020-12-06: 1000 mg via ORAL

## 2020-12-06 MED ORDER — SODIUM CHLORIDE 0.9 % IR SOLN
Status: DC | PRN
  Administered 2020-12-06 (×2): 3000 mL

## 2020-12-06 MED ORDER — LACTATED RINGERS IV SOLN: INTRAVENOUS | Status: DC

## 2020-12-06 MED ORDER — KETOROLAC TROMETHAMINE 30 MG/ML IJ SOLN
INTRAMUSCULAR | Status: DC | PRN
  Administered 2020-12-06: 30 mg via INTRAVENOUS

## 2020-12-06 MED ORDER — PROPOFOL 10 MG/ML IV BOLUS
INTRAVENOUS | Status: DC | PRN
  Administered 2020-12-06: 200 mg via INTRAVENOUS

## 2020-12-06 MED ORDER — DEXAMETHASONE SODIUM PHOSPHATE 10 MG/ML IJ SOLN
INTRAMUSCULAR | Status: AC
  Filled 2020-12-06: qty 1

## 2020-12-06 MED ORDER — SCOPOLAMINE 1 MG/3DAYS TD PT72
MEDICATED_PATCH | TRANSDERMAL | Status: AC
  Filled 2020-12-06: qty 1

## 2020-12-06 MED ORDER — ONDANSETRON HCL 4 MG/2ML IJ SOLN
INTRAMUSCULAR | Status: DC | PRN
  Administered 2020-12-06: 4 mg via INTRAVENOUS

## 2020-12-06 SURGICAL SUPPLY — 15 items
DEVICE MYOSURE REACH (MISCELLANEOUS) ×2 IMPLANT
GAUZE 4X4 16PLY ~~LOC~~+RFID DBL (SPONGE) ×2 IMPLANT
GLOVE SURG LTX SZ6.5 (GLOVE) ×2 IMPLANT
GLOVE SURG POLYISO LF SZ6.5 (GLOVE) ×2 IMPLANT
GLOVE SURG UNDER POLY LF SZ6.5 (GLOVE) ×2 IMPLANT
GLOVE SURG UNDER POLY LF SZ7 (GLOVE) ×4 IMPLANT
GOWN STRL REUS W/TWL LRG LVL3 (GOWN DISPOSABLE) ×4 IMPLANT
IV NS IRRIG 3000ML ARTHROMATIC (IV SOLUTION) ×4 IMPLANT
KIT PROCEDURE FLUENT (KITS) ×2 IMPLANT
KIT TURNOVER CYSTO (KITS) ×2 IMPLANT
PACK VAGINAL MINOR WOMEN LF (CUSTOM PROCEDURE TRAY) ×2 IMPLANT
PAD OB MATERNITY 4.3X12.25 (PERSONAL CARE ITEMS) ×2 IMPLANT
PAD PREP 24X48 CUFFED NSTRL (MISCELLANEOUS) ×2 IMPLANT
SEAL ROD LENS SCOPE MYOSURE (ABLATOR) ×2 IMPLANT
TOWEL OR 17X26 10 PK STRL BLUE (TOWEL DISPOSABLE) ×2 IMPLANT

## 2020-12-06 NOTE — Op Note (Signed)
NAME: Tina Fleming, LORMAN MEDICAL RECORD NO: [id number] ACCOUNT NO: [id number] DATE OF BIRTH: 1980-10-18 FACILITY: [facility] LOCATION: [facility] PHYSICIAN: Jacobs Golab A. Cherly Hensen, MD  Operative Report   DATE OF PROCEDURE: 12/06/2020  PREOPERATIVE DIAGNOSIS:  Dysfunctional uterine bleeding.  PROCEDURE:  Diagnostic hysteroscopy, hysteroscopic resection of endometrial polyps, dilation and curettage.  POSTOPERATIVE DIAGNOSES:  Dysfunctional uterine bleeding, endometrial polyps.  ANESTHESIA:  General.  SURGEON:  Maxie Better, MD  ASSISTANT:  None.  DESCRIPTION OF PROCEDURE:  Under adequate general anesthesia, the patient was placed in the dorsal lithotomy position.  She was sterilely prepped and draped in the usual fashion.  The bladder was not catheterized as the patient had voided prior to  entering the room.  Examination under anesthesia revealed an anteverted uterus.  No adnexal masses could be appreciated.  The patient had been sterilely prepped and draped in the usual fashion.  Bivalve speculum was placed in the vagina.  A single-tooth  tenaculum was placed on the anterior lip of the cervix.  The cervix was then serially dilated up to #19 [facility] dilator.  A diagnostic hysteroscope was introduced into the uterine cavity.  On entering the cavity, on the lower uterine segment posteriorly  was a large polypoid lesion extending into the endocervical canal.  In addition, additional endometrial polypoid lesions were noted.  The right tube appeared slightly sclerosed, the left was patent. Using the Reach resectoscope, the endometrial polyps  and the endometrial cavity was resected. When all polypoid lesions had been removed and endometrium had been resected, all instruments were then removed from the vagina.  Specimen labeled endometrial curetting with polyps was sent to pathology.  Estimated blood loss : 2 ml  Fluid  deficit was 500 mL. Complication was none.  The patient tolerated the procedure  well and was transferred to recovery room in stable condition.   SHW D: 12/06/2020 8:13:11 pm T: 12/06/2020 8:57:00 pm  JOB: [id number]/ [id number]

## 2020-12-06 NOTE — Interval H&P Note (Signed)
History and Physical Interval Note:  12/06/2020 8:26 AM  Tina Fleming  has presented today for surgery, with the diagnosis of DYSFUCNTIONAL UTERINE BLEEDING.  The various methods of treatment have been discussed with the patient and family. After consideration of risks, benefits and other options for treatment, the patient has consented to  DIAGNOSTIC HYSTEROSCOPY, DILATION AND CURETTAGE, RESECTION USING MYOSURE as a surgical intervention.  The patient's history has been reviewed, patient examined, no change in status, stable for surgery.  I have reviewed the patient's chart and labs.  Questions were answered to the patient's satisfaction.     [name] A Altonio Schwertner

## 2020-12-06 NOTE — Anesthesia Postprocedure Evaluation (Signed)
Anesthesia Post Note  Patient: Tina Fleming  Procedure(s) Performed: DILATATION & CURETTAGE/DIAGNOSTIC HYSTEROSCOPY/ HYSTEROSCOPIC RESECTION OF ENDOMETRIAL POLYP USING MYOSURE (Abdomen)     Anesthesia Post Evaluation No notable events documented.  Last Vitals:  Vitals:   12/06/20 1045 12/06/20 1100  BP: 124/71   Pulse: (!) 59   Resp: 15   Temp:  36.7 C  SpO2: 100%     Last Pain:  Vitals:   12/06/20 1100  TempSrc:   PainSc: 3                  Kaylyn Layer

## 2020-12-06 NOTE — Discharge Instructions (Addendum)
DISCHARGE INSTRUCTIONS: D&C / D&E The following instructions have been prepared to help you care for yourself upon your return home.   Personal hygiene:  Use sanitary pads for vaginal drainage, not tampons.  Shower the day after your procedure.  NO tub baths, pools or Jacuzzis for 2-3 weeks.  Wipe front to back after using the bathroom.  Activity and limitations:  Do NOT drive or operate any equipment for 24 hours. The effects of anesthesia are still present and drowsiness may result.  Do NOT rest in bed all day.  Walking is encouraged.  Walk up and down stairs slowly.  You may resume your normal activity in one to two days or as indicated by your physician.  Sexual activity: NO intercourse for at least 2 weeks after the procedure, or as indicated by your physician.  Diet: Eat a light meal as desired this evening. You may resume your usual diet tomorrow.  Return to work: You may resume your work activities in one to two days or as indicated by your doctor.  What to expect after your surgery: Expect to have vaginal bleeding/discharge for 2-3 days and spotting for up to 10 days. It is not unusual to have soreness for up to 1-2 weeks. You may have a slight burning sensation when you urinate for the first day. Mild cramps may continue for a couple of days. You may have a regular period in 2-6 weeks.  Call your doctor for any of the following:  Excessive vaginal bleeding, saturating and changing one pad every hour.  Inability to urinate 6 hours after discharge from hospital.  Pain not relieved by pain medication.  Fever of 100.4 F or greater.  Unusual vaginal discharge or odor.   Post Anesthesia Home Care Instructions  Activity: Get plenty of rest for the remainder of the day. A responsible individual must stay with you for 24 hours following the procedure.  For the next 24 hours, DO NOT: -Drive a car -Advertising copywriter -Drink alcoholic beverages -Take any medication unless  instructed by your physician -Make any legal decisions or sign important papers.  Meals: Start with liquid foods such as gelatin or soup. Progress to regular foods as tolerated. Avoid greasy, spicy, heavy foods. If nausea and/or vomiting occur, drink only clear liquids until the nausea and/or vomiting subsides. Call your physician if vomiting continues.  Special Instructions/Symptoms: Your throat may feel dry or sore from the anesthesia or the breathing tube placed in your throat during surgery. If this causes discomfort, gargle with warm salt water. The discomfort should disappear within 24 hours.  If you had a scopolamine patch placed behind your ear for the management of post- operative nausea and/or vomiting:  1. The medication in the patch is effective for 72 hours, after which it should be removed.  Wrap patch in a tissue and discard in the trash. Wash hands thoroughly with soap and water. 2. You may remove the patch earlier than 72 hours if you experience unpleasant side effects which may include dry mouth, dizziness or visual disturbances. 3. Avoid touching the patch. Wash your hands with soap and water after contact with the patch.    No acetaminophen/Tylenol until after 2:30 pm today if needed.

## 2020-12-06 NOTE — Anesthesia Procedure Notes (Signed)
Procedure Name: LMA Insertion Date/Time: 12/06/2020 10:03 AM Performed by: Bishop Limbo, CRNA Pre-anesthesia Checklist: Patient identified, Emergency Drugs available, Suction available and Patient being monitored Patient Re-evaluated:Patient Re-evaluated prior to induction Oxygen Delivery Method: Circle System Utilized Preoxygenation: Pre-oxygenation with 100% oxygen Induction Type: IV induction Ventilation: Mask ventilation without difficulty LMA: LMA inserted LMA Size: 4.0 Number of attempts: 1 Placement Confirmation: positive ETCO2 Tube secured with: Tape Dental Injury: Teeth and Oropharynx as per pre-operative assessment

## 2020-12-06 NOTE — H&P (Signed)
Tina Fleming is an 40 y.o. female. G0  F here for surgical mgmt of abnormal uterine bleeding not responsive to hormonal therapy. Pt is scheduled for dx hysteroscopy, D&C  Pertinent Gynecological History: Menses:  n Bleeding: dysfunctional uterine bleeding Contraception: none DES exposure: denies Blood transfusions: none Sexually transmitted diseases: no past history Previous GYN Procedures: DNC  Last mammogram:  n/a  Date: n/a Last pap: normal Date: 2022 OB History: G0, P0   Menstrual History: Menarche age: n/a Patient's last menstrual period was 10/31/2020 (exact date).    Past Medical History:  Diagnosis Date  . Bilateral occipital neuralgia   . Complication of anesthesia    hard to wake due to  prolonged sedation  . DUB (dysfunctional uterine bleeding)   . Heart murmur    per pt asymtomatic, dx age 44 and told had normal echo  . History of 2019 novel coronavirus disease (COVID-19) 05/2018   per pt mild symptoms that resolved  . History of kidney stones   . History of urethral stricture    age 89 s/p dilatation  . IBS (irritable bowel syndrome)   . IDA (iron deficiency anemia)   . Intractable migraine with aura without status migrainosus    neruologist--- dr h. Sherryll Burger  . Thyroid nodule    per pt had biopsy of nodule approx. 2019 or 2020 , benign , and no follow up needed  . Vitamin D deficiency     Past Surgical History:  Procedure Laterality Date  . DILATATION & CURETTAGE/HYSTEROSCOPY WITH MYOSURE N/A 03/20/2016   Procedure: DILATATION & CURETTAGE/HYSTEROSCOPY WITH MYOSURE;  Surgeon: Maxie Better, MD;  Location: [facility];  Service: Gynecology;  Laterality: N/A;  45 min. requested  . EXTRACORPOREAL SHOCK WAVE LITHOTRIPSY  04/18/2013   @[facility]   . KNEE ARTHROSCOPY  04/24/2011   Procedure: ARTHROSCOPY KNEE;  Surgeon: 04/26/2011;  Location: [facility];  Service: Orthopedics;  Laterality: Right;  /Arthroscopy with debridement, right knee  .  LAPAROSCOPIC APPENDECTOMY  03/21/2012   Procedure: APPENDECTOMY LAPAROSCOPIC;  Surgeon: 03/23/2012. Cornett, MD;  Location: [facility];  Service: General;  Laterality: N/A;  . URETHRAL STRICTURE DILATATION     age 48    Family History  Problem Relation Age of Onset  . Cancer Mother        pancreatic  . Arthritis Mother   . Hyperlipidemia Mother   . Hypertension Mother   . Cancer Father        skin  . Arthritis Father   . Hyperlipidemia Father   . Hypertension Father   . Diabetes Father   . Ankylosing spondylitis Brother   . Spondylitis Brother   . Breast cancer Paternal Aunt        50-60  . Breast cancer Paternal Aunt        53-60's    Social History:  reports that she has never smoked. She has never used smokeless tobacco. She reports that she does not drink alcohol and does not use drugs.  Allergies:  Allergies  Allergen Reactions  . Sulfa Antibiotics Rash    No medications prior to admission.    Review of Systems  All other systems reviewed and are negative.  Height 5\' 5"  (1.651 m), weight 106.1 kg, last menstrual period 10/31/2020. Physical Exam Eyes:     Extraocular Movements: Extraocular movements intact.  Cardiovascular:     Rate and Rhythm: Regular rhythm.  Pulmonary:     Breath sounds: Normal breath sounds.  Abdominal:  Palpations: Abdomen is soft.  Genitourinary:    General: Normal vulva.     Comments: Vagina nl Cervix nl Uterus AV nl Adnexa nl Musculoskeletal:        General: Normal range of motion.  Skin:    General: Skin is warm and dry.  Neurological:     General: No focal deficit present.     Mental Status: She is alert.  Psychiatric:        Mood and Affect: Mood normal.        Behavior: Behavior normal.   No results found for this or any previous visit (from the past 24 hour(s)).  No results found.  Assessment/Plan: DUB P) dx hysteroscopy, D&C, resection. Procedure explained. Risk of surgery reviewed including infection, bleeding,  injury to surrounding organ structures, uterine perforation( 05/998) and its risk, thermal injury, fluid overload and its mgmt. All ? answered  Shuan Statzer A Jawaan Adachi 12/06/2020, 7:25 AM

## 2020-12-06 NOTE — Transfer of Care (Signed)
Immediate Anesthesia Transfer of Care Note  Patient: Tina Fleming  Procedure(s) Performed: DILATATION & CURETTAGE/DIAGNOSTIC HYSTEROSCOPY/ HYSTEROSCOPIC RESECTION OF ENDOMETRIAL POLYP USING MYOSURE  Patient Location: PACU  Anesthesia Type:General  Level of Consciousness: awake, alert , oriented and patient cooperative  Airway & Oxygen Therapy: Patient Spontanous Breathing and Patient connected to face mask oxygen  Post-op Assessment: Report given to RN and Post -op Vital signs reviewed and stable  Post vital signs: Reviewed and stable  Last Vitals:  Vitals Value Taken Time  BP 129/65 12/06/20 1040  Temp 37.3 C 12/06/20 1040  Pulse 62 12/06/20 1045  Resp 18 12/06/20 1045  SpO2 100 % 12/06/20 1045  Vitals shown include unvalidated device data.  Last Pain:  Vitals:   12/06/20 0839  TempSrc: Oral  PainSc: 0-No pain      Patients Stated Pain Goal: 4 (12/06/20 0839)  Complications: No notable events documented.

## 2020-12-06 NOTE — Brief Op Note (Signed)
12/06/2020  10:40 AM  PATIENT:  Tina Fleming  40 y.o. female  PRE-OPERATIVE DIAGNOSIS:  Dysfunctional  uterine bleeding  POST-OPERATIVE DIAGNOSIS:  Dysfunctional uterine bleeding, endometrial polyps  PROCEDURE:  Diagnostic hysteroscopy, hysteroscopic resection of endometrial polyps,  dilation and curettage  SURGEON:  Surgeon(s) and Role:    * Maxie Better, MD - Primary  PHYSICIAN ASSISTANT:   ASSISTANTS: none   ANESTHESIA:   general FINDINGS: large posterior endometrial polyps, left tube ostia, right sclerosed, endocervical canal EBL:  2 mL   BLOOD ADMINISTERED:none  DRAINS: none   LOCAL MEDICATIONS USED:  NONE  SPECIMEN:  Source of Specimen:  EMC with endometrial polyps  DISPOSITION OF SPECIMEN:  PATHOLOGY  COUNTS:  YES  TOURNIQUET:  * No tourniquets in log *  DICTATION: .Other Dictation: Dictation Number [id number]  PLAN OF CARE: Discharge to home after PACU  PATIENT DISPOSITION:  PACU - hemodynamically stable.   Delay start of Pharmacological VTE agent (>24hrs) due to surgical blood loss or risk of bleeding: no

## 2020-12-09 ENCOUNTER — Encounter (HOSPITAL_BASED_OUTPATIENT_CLINIC_OR_DEPARTMENT_OTHER): Payer: Self-pay | Admitting: Obstetrics and Gynecology

## 2020-12-09 LAB — SURGICAL PATHOLOGY

## 2020-12-10 ENCOUNTER — Ambulatory Visit: Payer: BC Managed Care – PPO | Admitting: Internal Medicine

## 2020-12-10 ENCOUNTER — Other Ambulatory Visit: Payer: Self-pay

## 2020-12-10 VITALS — BP 128/80 | HR 68 | Temp 97.9°F | Resp 16 | Ht 65.0 in | Wt 236.0 lb

## 2020-12-10 DIAGNOSIS — F439 Reaction to severe stress, unspecified: Secondary | ICD-10-CM | POA: Diagnosis not present

## 2020-12-10 DIAGNOSIS — E041 Nontoxic single thyroid nodule: Secondary | ICD-10-CM

## 2020-12-10 DIAGNOSIS — N938 Other specified abnormal uterine and vaginal bleeding: Secondary | ICD-10-CM

## 2020-12-10 DIAGNOSIS — D509 Iron deficiency anemia, unspecified: Secondary | ICD-10-CM | POA: Diagnosis not present

## 2020-12-10 DIAGNOSIS — Z1231 Encounter for screening mammogram for malignant neoplasm of breast: Secondary | ICD-10-CM

## 2020-12-10 DIAGNOSIS — G43109 Migraine with aura, not intractable, without status migrainosus: Secondary | ICD-10-CM

## 2020-12-10 DIAGNOSIS — Z13220 Encounter for screening for lipoid disorders: Secondary | ICD-10-CM

## 2020-12-10 LAB — BASIC METABOLIC PANEL
BUN: 14 mg/dL (ref 6–23)
CO2: 24 mEq/L (ref 19–32)
Calcium: 9.5 mg/dL (ref 8.4–10.5)
Chloride: 106 mEq/L (ref 96–112)
Creatinine, Ser: 0.74 mg/dL (ref 0.40–1.20)
GFR: 102.2 mL/min (ref >60.00)
Glucose, Bld: 91 mg/dL (ref 70–99)
Potassium: 4.2 mEq/L (ref 3.5–5.1)
Sodium: 138 mEq/L (ref 135–145)

## 2020-12-10 LAB — LIPID PANEL
Cholesterol: 170 mg/dL (ref 0–200)
HDL: 43.5 mg/dL (ref >39.00)
LDL Cholesterol: 100 mg/dL — ABNORMAL HIGH (ref 0–99)
NonHDL: 126.5
Total CHOL/HDL Ratio: 4
Triglycerides: 133 mg/dL (ref 0.0–149.0)
VLDL: 26.6 mg/dL (ref 0.0–40.0)

## 2020-12-10 LAB — TSH: TSH: 1.53 u[IU]/mL (ref 0.35–5.50)

## 2020-12-10 LAB — HEPATIC FUNCTION PANEL
ALT: 12 U/L (ref 0–35)
AST: 12 U/L (ref 0–37)
Albumin: 4.3 g/dL (ref 3.5–5.2)
Alkaline Phosphatase: 84 U/L (ref 39–117)
Bilirubin, Direct: 0.1 mg/dL (ref 0.0–0.3)
Total Bilirubin: 0.5 mg/dL (ref 0.2–1.2)
Total Protein: 6.8 g/dL (ref 6.0–8.3)

## 2020-12-10 LAB — T4, FREE: Free T4: 1 ng/dL (ref 0.60–1.60)

## 2020-12-10 NOTE — Assessment & Plan Note (Signed)
S/p D&C Friday 12/06/20.  hgb 9.6.  Just checked Friday.  Follow cbc and iron studies.  Hold on checking today, since just checked last week.

## 2020-12-10 NOTE — Progress Notes (Signed)
Patient ID: Tina Fleming, female   DOB: 03/30/1981, 40 y.o.   MRN: [id number]   Subjective:    Patient ID: Tina Fleming, female    DOB: 24-Oct-1980, 40 y.o.   MRN: [id number]  HPI This visit occurred during the SARS-CoV-2 public health emergency.  Safety protocols were in place, including screening questions prior to the visit, additional usage of staff PPE, and extensive cleaning of exam room while observing appropriate contact time as indicated for disinfecting solutions.   Patient here for a scheduled follow up.  Here to follow-up regarding her anemia.  Also has a history of a thyroid nodule.  She just had a D&C 12/06/2020.  Tolerated procedure well.  Hemoglobin checked 12/06/2020 was 9.6.  She is still having some bleeding.  Has been in contact with GYN.  Informed her to expect bleeding - 10 days.  No increased abdominal pain.  She is trying to stay active.  No chest pain or shortness of breath reported.  Bowels stable.  Stress is better.  She has changed jobs. Headaches ok.  Off emgality.   Past Medical History:  Diagnosis Date   Bilateral occipital neuralgia    Complication of anesthesia    hard to wake due to  prolonged sedation   DUB (dysfunctional uterine bleeding)    Heart murmur    per pt asymtomatic, dx age 10 and told had normal echo   History of 2019 novel coronavirus disease (COVID-19) 05/2018   per pt mild symptoms that resolved   History of kidney stones    History of urethral stricture    age 72 s/p dilatation   IBS (irritable bowel syndrome)    IDA (iron deficiency anemia)    Intractable migraine with aura without status migrainosus    neruologist--- dr h. Sherryll Burger   Thyroid nodule    per pt had biopsy of nodule approx. 2019 or 2020 , benign , and no follow up needed   Vitamin D deficiency    Past Surgical History:  Procedure Laterality Date   DILATATION & CURETTAGE/HYSTEROSCOPY WITH MYOSURE N/A 03/20/2016   Procedure: DILATATION & CURETTAGE/HYSTEROSCOPY WITH  MYOSURE;  Surgeon: Maxie Better, MD;  Location: [facility];  Service: Gynecology;  Laterality: N/A;  45 min. requested   DILATATION & CURETTAGE/HYSTEROSCOPY WITH MYOSURE N/A 12/06/2020   Procedure: DILATATION & CURETTAGE/DIAGNOSTIC HYSTEROSCOPY/ HYSTEROSCOPIC RESECTION OF ENDOMETRIAL POLYP USING MYOSURE;  Surgeon: Maxie Better, MD;  Location: [facility];  Service: Gynecology;  Laterality: N/A;   EXTRACORPOREAL SHOCK WAVE LITHOTRIPSY  04/18/2013   @[facility]    KNEE ARTHROSCOPY  04/24/2011   Procedure: ARTHROSCOPY KNEE;  Surgeon: 04/26/2011;  Location: [facility];  Service: Orthopedics;  Laterality: Right;  /Arthroscopy with debridement, right knee   LAPAROSCOPIC APPENDECTOMY  03/21/2012   Procedure: APPENDECTOMY LAPAROSCOPIC;  Surgeon: 03/23/2012. Cornett, MD;  Location: [facility];  Service: General;  Laterality: N/A;   URETHRAL STRICTURE DILATATION     age 24   Family History  Problem Relation Age of Onset   Cancer Mother        pancreatic   Arthritis Mother    Hyperlipidemia Mother    Hypertension Mother    Cancer Father        skin   Arthritis Father    Hyperlipidemia Father    Hypertension Father    Diabetes Father    Ankylosing spondylitis Brother    Spondylitis Brother    Breast cancer Paternal Aunt  50-60   Breast cancer Paternal Aunt        50-60's   Social History   Socioeconomic History   Marital status: Single    Spouse name: Not on file   Number of children: Not on file   Years of education: Not on file   Highest education level: Not on file  Occupational History   Not on file  Tobacco Use   Smoking status: Never   Smokeless tobacco: Never  Vaping Use   Vaping Use: Never used  Substance and Sexual Activity   Alcohol use: No    Alcohol/week: 0.0 standard drinks   Drug use: No   Sexual activity: Not Currently    Birth control/protection: None  Other Topics Concern   Not on file  Social History Narrative   Not on  file   Social Determinants of Health   Financial Resource Strain: Not on file  Food Insecurity: Not on file  Transportation Needs: Not on file  Physical Activity: Not on file  Stress: Not on file  Social Connections: Not on file    Review of Systems  Constitutional:  Negative for appetite change and unexpected weight change.  HENT:  Negative for congestion and sinus pressure.   Respiratory:  Negative for cough, chest tightness and shortness of breath.   Cardiovascular:  Negative for chest pain, palpitations and leg swelling.  Gastrointestinal:  Negative for abdominal pain, diarrhea, nausea and vomiting.  Genitourinary:  Negative for difficulty urinating and dysuria.  Musculoskeletal:  Negative for joint swelling and myalgias.  Skin:  Negative for color change and rash.  Neurological:  Negative for dizziness and light-headedness.       Headaches - controlled.   Psychiatric/Behavioral:  Negative for agitation and dysphoric mood.       Objective:    Physical Exam Vitals reviewed.  Constitutional:      General: She is not in acute distress.    Appearance: Normal appearance.  HENT:     Head: Normocephalic and atraumatic.     Right Ear: External ear normal.     Left Ear: External ear normal.  Eyes:     General: No scleral icterus.       Right eye: No discharge.        Left eye: No discharge.     Conjunctiva/sclera: Conjunctivae normal.  Neck:     Thyroid: No thyromegaly.  Cardiovascular:     Rate and Rhythm: Normal rate and regular rhythm.  Pulmonary:     Effort: No respiratory distress.     Breath sounds: Normal breath sounds. No wheezing.  Abdominal:     General: Bowel sounds are normal.     Palpations: Abdomen is soft.     Tenderness: There is no abdominal tenderness.  Musculoskeletal:        General: No swelling or tenderness.     Cervical back: Neck supple. No tenderness.  Lymphadenopathy:     Cervical: No cervical adenopathy.  Skin:    Findings: No erythema  or rash.  Neurological:     Mental Status: She is alert.  Psychiatric:        Mood and Affect: Mood normal.        Behavior: Behavior normal.    BP 128/80   Pulse 68   Temp 97.9 F (36.6 C)   Resp 16   Ht 5\' 5"  (1.651 m)   Wt 236 lb (107 kg)   SpO2 99%   BMI 39.27 kg/m  Wt  Readings from Last 3 Encounters:  12/10/20 236 lb (107 kg)  11/10/20 234 lb (106.1 kg)  03/21/20 240 lb (108.9 kg)    Outpatient Encounter Medications as of 12/10/2020  Medication Sig   Cholecalciferol (VITAMIN D PO) Take 1 capsule by mouth every morning.   Cyanocobalamin (B-12 PO) Take by mouth daily.   ibuprofen (ADVIL) 800 MG tablet Take 1 tablet (800 mg total) by mouth every 8 (eight) hours as needed for moderate pain.   iron polysaccharides (FERREX 150) 150 MG capsule TAKE 1 CAPSULE (150 MG TOTAL) BY MOUTH 2 TIMES DAILY.   No facility-administered encounter medications on file as of 12/10/2020.     Lab Results  Component Value Date   WBC 8.4 12/06/2020   HGB 9.6 (L) 12/06/2020   HCT 32.5 (L) 12/06/2020   PLT 428 (H) 12/06/2020   GLUCOSE 91 12/10/2020   CHOL 170 12/10/2020   TRIG 133.0 12/10/2020   HDL 43.50 12/10/2020   LDLCALC 100 (H) 12/10/2020   ALT 12 12/10/2020   AST 12 12/10/2020   NA 138 12/10/2020   K 4.2 12/10/2020   CL 106 12/10/2020   CREATININE 0.74 12/10/2020   BUN 14 12/10/2020   CO2 24 12/10/2020   TSH 1.53 12/10/2020   INR 1.09 07/14/2016       Assessment & Plan:   Problem List Items Addressed This Visit     Anemia - Primary    S/p D&C Friday 12/06/20.  hgb 9.6.  Just checked Friday.  Follow cbc and iron studies.  Hold on checking today, since just checked last week.        Relevant Orders   Hepatic function panel (Completed)   Basic metabolic panel (Completed)   DUB (dysfunctional uterine bleeding)    S/p D&C.  Still some bleeding as outlined.  Continue f/u with gyn.        Migraine    Off emgality.  Has been followed by neurology.  Stable.         Stress    She has changed jobs.  Stress is better.  Follow.         Thyroid nodule    Saw Dr Tedd Sias.  S/p biopsy.  Pt reports no change and that no further w/up warranted.  Last evaluated 10/12/18 by endocrinology.  Check tsh and free T4.          Relevant Orders   T4, free (Completed)   TSH (Completed)   Other Visit Diagnoses     Screening cholesterol level       Relevant Orders   Lipid panel (Completed)   Encounter for screening mammogram for malignant neoplasm of breast       Relevant Orders   MM 3D SCREEN BREAST BILATERAL        Dale Kirkwood, MD

## 2020-12-10 NOTE — Assessment & Plan Note (Signed)
She has changed jobs.  Stress is better.  Follow.

## 2020-12-10 NOTE — Assessment & Plan Note (Addendum)
Saw Dr Tedd Sias.  S/p biopsy.  Pt reports no change and that no further w/up warranted.  Last evaluated 10/12/18 by endocrinology.  Check tsh and free T4.

## 2020-12-17 ENCOUNTER — Encounter: Payer: Self-pay | Admitting: Internal Medicine

## 2020-12-17 DIAGNOSIS — D649 Anemia, unspecified: Secondary | ICD-10-CM

## 2020-12-17 NOTE — Assessment & Plan Note (Signed)
S/p D&C.  Still some bleeding as outlined.  Continue f/u with gyn.

## 2020-12-17 NOTE — Assessment & Plan Note (Signed)
Off emgality.  Has been followed by neurology.  Stable.

## 2020-12-17 NOTE — Telephone Encounter (Signed)
I have placed the order for the labs.  Please schedule her a non fasting lab appt within the week.

## 2020-12-18 ENCOUNTER — Other Ambulatory Visit (INDEPENDENT_AMBULATORY_CARE_PROVIDER_SITE_OTHER): Payer: BC Managed Care – PPO

## 2020-12-18 ENCOUNTER — Other Ambulatory Visit: Payer: Self-pay

## 2020-12-18 DIAGNOSIS — D649 Anemia, unspecified: Secondary | ICD-10-CM | POA: Diagnosis not present

## 2020-12-18 LAB — IBC + FERRITIN
Ferritin: 3.8 ng/mL — ABNORMAL LOW (ref 10.0–291.0)
Iron: 16 ug/dL — ABNORMAL LOW (ref 42–145)
Saturation Ratios: 3.3 % — ABNORMAL LOW (ref 20.0–50.0)
Transferrin: 346 mg/dL (ref 212.0–360.0)

## 2020-12-18 LAB — CBC WITH DIFFERENTIAL/PLATELET
Basophils Absolute: 0.1 10*3/uL (ref 0.0–0.1)
Basophils Relative: 0.7 % (ref 0.0–3.0)
Eosinophils Absolute: 0.1 10*3/uL (ref 0.0–0.7)
Eosinophils Relative: 1.4 % (ref 0.0–5.0)
HCT: 30.8 % — ABNORMAL LOW (ref 36.0–46.0)
Hemoglobin: 9.5 g/dL — ABNORMAL LOW (ref 12.0–15.0)
Lymphocytes Relative: 23.2 % (ref 12.0–46.0)
Lymphs Abs: 2.1 10*3/uL (ref 0.7–4.0)
MCHC: 31 g/dL (ref 30.0–36.0)
MCV: 67 fl — ABNORMAL LOW (ref 78.0–100.0)
Monocytes Absolute: 0.5 10*3/uL (ref 0.1–1.0)
Monocytes Relative: 5.6 % (ref 3.0–12.0)
Neutro Abs: 6.3 10*3/uL (ref 1.4–7.7)
Neutrophils Relative %: 69.1 % (ref 43.0–77.0)
Platelets: 471 10*3/uL — ABNORMAL HIGH (ref 150.0–400.0)
RBC: 4.59 Mil/uL (ref 3.87–5.11)
RDW: 18.9 % — ABNORMAL HIGH (ref 11.5–15.5)
WBC: 9.1 10*3/uL (ref 4.0–10.5)

## 2020-12-18 LAB — VITAMIN B12: Vitamin B-12: 538 pg/mL (ref 211–911)

## 2020-12-18 NOTE — Telephone Encounter (Signed)
Patient scheduled for this PM

## 2021-02-20 ENCOUNTER — Encounter: Payer: Self-pay | Admitting: Internal Medicine

## 2021-02-20 ENCOUNTER — Other Ambulatory Visit: Payer: Self-pay

## 2021-02-20 MED ORDER — POLYSACCHARIDE IRON COMPLEX 150 MG PO CAPS: ORAL_CAPSULE | ORAL | 1 refills | Status: DC

## 2021-03-21 ENCOUNTER — Telehealth: Payer: Self-pay | Admitting: Internal Medicine

## 2021-03-21 NOTE — Telephone Encounter (Signed)
Called patient for more info. Persistent abd pain x 2-3 weeks. Above the [employer] area. Rated about a 4. Not severe and has not had to take any medication for pain relief. Patient denies nausea, vomiting, bleeding, etc. Still able to eat and drink. Offered an appt with the NP next week to be evaluated. Patient declined. Scheduled for 11/7 and advised if symptoms change, worsen or develop new symptoms- needs to be evaluated more acutely. Patient gave verbal understanding. Triage note attached.

## 2021-03-21 NOTE — Telephone Encounter (Signed)
Patient informed, Due to the high volume of calls and your symptoms we have to forward your call to our Triage Nurse to expedient your call. Please hold for the transfer.  Patient transferred to De[facility] at [facility]. Due to having stomach pain on and off for the past 2-3 weeks.No openings in office or virtual for the patient to be evaluated.Patient disconnected the call before transfer.[facility]ra at [facility] will contact the patient to triage her.

## 2021-03-31 ENCOUNTER — Other Ambulatory Visit: Payer: Self-pay

## 2021-03-31 ENCOUNTER — Ambulatory Visit (INDEPENDENT_AMBULATORY_CARE_PROVIDER_SITE_OTHER): Payer: BC Managed Care – PPO | Admitting: Internal Medicine

## 2021-03-31 ENCOUNTER — Encounter: Payer: Self-pay | Admitting: Internal Medicine

## 2021-03-31 ENCOUNTER — Ambulatory Visit (INDEPENDENT_AMBULATORY_CARE_PROVIDER_SITE_OTHER): Payer: BC Managed Care – PPO

## 2021-03-31 VITALS — BP 136/80 | HR 73 | Temp 97.8°F | Resp 16 | Ht 65.0 in | Wt 228.0 lb

## 2021-03-31 DIAGNOSIS — R1033 Periumbilical pain: Secondary | ICD-10-CM | POA: Diagnosis not present

## 2021-03-31 DIAGNOSIS — D509 Iron deficiency anemia, unspecified: Secondary | ICD-10-CM

## 2021-03-31 DIAGNOSIS — R109 Unspecified abdominal pain: Secondary | ICD-10-CM | POA: Diagnosis not present

## 2021-03-31 DIAGNOSIS — G43109 Migraine with aura, not intractable, without status migrainosus: Secondary | ICD-10-CM | POA: Diagnosis not present

## 2021-03-31 DIAGNOSIS — K581 Irritable bowel syndrome with constipation: Secondary | ICD-10-CM

## 2021-03-31 DIAGNOSIS — F439 Reaction to severe stress, unspecified: Secondary | ICD-10-CM

## 2021-03-31 LAB — IBC + FERRITIN
Ferritin: 3.3 ng/mL — ABNORMAL LOW (ref 10.0–291.0)
Iron: 19 ug/dL — ABNORMAL LOW (ref 42–145)
Saturation Ratios: 3.8 % — ABNORMAL LOW (ref 20.0–50.0)
TIBC: 501.2 ug/dL — ABNORMAL HIGH (ref 250.0–450.0)
Transferrin: 358 mg/dL (ref 212.0–360.0)

## 2021-03-31 LAB — CBC WITH DIFFERENTIAL/PLATELET
Basophils Absolute: 0.1 10*3/uL (ref 0.0–0.1)
Basophils Relative: 0.6 % (ref 0.0–3.0)
Eosinophils Absolute: 0.1 10*3/uL (ref 0.0–0.7)
Eosinophils Relative: 0.8 % (ref 0.0–5.0)
HCT: 32.3 % — ABNORMAL LOW (ref 36.0–46.0)
Hemoglobin: 9.8 g/dL — ABNORMAL LOW (ref 12.0–15.0)
Lymphocytes Relative: 18.9 % (ref 12.0–46.0)
Lymphs Abs: 1.7 10*3/uL (ref 0.7–4.0)
MCHC: 30.2 g/dL (ref 30.0–36.0)
MCV: 64.5 fl — ABNORMAL LOW (ref 78.0–100.0)
Monocytes Absolute: 0.4 10*3/uL (ref 0.1–1.0)
Monocytes Relative: 4.7 % (ref 3.0–12.0)
Neutro Abs: 6.6 10*3/uL (ref 1.4–7.7)
Neutrophils Relative %: 75 % (ref 43.0–77.0)
Platelets: 437 10*3/uL — ABNORMAL HIGH (ref 150.0–400.0)
RBC: 5.02 Mil/uL (ref 3.87–5.11)
RDW: 20.4 % — ABNORMAL HIGH (ref 11.5–15.5)
WBC: 8.8 10*3/uL (ref 4.0–10.5)

## 2021-03-31 LAB — BASIC METABOLIC PANEL
BUN: 17 mg/dL (ref 6–23)
CO2: 25 mEq/L (ref 19–32)
Calcium: 9.8 mg/dL (ref 8.4–10.5)
Chloride: 103 mEq/L (ref 96–112)
Creatinine, Ser: 0.71 mg/dL (ref 0.40–1.20)
GFR: 107.17 mL/min (ref >60.00)
Glucose, Bld: 80 mg/dL (ref 70–99)
Potassium: 4.2 mEq/L (ref 3.5–5.1)
Sodium: 138 mEq/L (ref 135–145)

## 2021-03-31 LAB — HEPATIC FUNCTION PANEL
ALT: 13 U/L (ref 0–35)
AST: 17 U/L (ref 0–37)
Albumin: 4.5 g/dL (ref 3.5–5.2)
Alkaline Phosphatase: 83 U/L (ref 39–117)
Bilirubin, Direct: 0.1 mg/dL (ref 0.0–0.3)
Total Bilirubin: 0.4 mg/dL (ref 0.2–1.2)
Total Protein: 7.2 g/dL (ref 6.0–8.3)

## 2021-03-31 LAB — POCT URINE PREGNANCY: Preg Test, Ur: NEGATIVE

## 2021-03-31 NOTE — Progress Notes (Signed)
Patient ID: Tina Fleming, female   DOB: 12/17/1980, 40 y.o.   MRN: [id number]   Subjective:    Patient ID: Tina Fleming, female    DOB: 05-Oct-1980, 40 y.o.   MRN: [id number]  This visit occurred during the SARS-CoV-2 public health emergency.  Safety protocols were in place, including screening questions prior to the visit, additional usage of staff PPE, and extensive cleaning of exam room while observing appropriate contact time as indicated for disinfecting solutions.   Patient here for work in appt.   Chief Complaint  Patient presents with   Abdominal Pain   .   HPI Work in for persistent abdominal pain.  Present for the last 3-4 weeks.  [place]  Has been working out.  Was questioning initially possible msk origin.  Persistent.  Some constipation.  Off iron now - stopped - to see if helps.  Has been following high protein diet since 09/2020.  Off iron x 2.5 weeks.  Discussed possible IV iron infusions given issues with oral iron.  Began colace.  Has IBS - C.  Eating.  No vomiting.  Breathing overall stable.  No cough or congestion.     Past Medical History:  Diagnosis Date   Bilateral occipital neuralgia    Complication of anesthesia    hard to wake due to  prolonged sedation   DUB (dysfunctional uterine bleeding)    Heart murmur    per pt asymtomatic, dx age 67 and told had normal echo   History of 2019 novel coronavirus disease (COVID-19) 05/2018   per pt mild symptoms that resolved   History of kidney stones    History of urethral stricture    age 29 s/p dilatation   IBS (irritable bowel syndrome)    IDA (iron deficiency anemia)    Intractable migraine with aura without status migrainosus    neruologist--- dr h. Sherryll Burger   Thyroid nodule    per pt had biopsy of nodule approx. 2019 or 2020 , benign , and no follow up needed   Vitamin D deficiency    Past Surgical History:  Procedure Laterality Date   DILATATION & CURETTAGE/HYSTEROSCOPY WITH MYOSURE N/A  03/20/2016   Procedure: DILATATION & CURETTAGE/HYSTEROSCOPY WITH MYOSURE;  Surgeon: Maxie Better, MD;  Location: [facility];  Service: Gynecology;  Laterality: N/A;  45 min. requested   DILATATION & CURETTAGE/HYSTEROSCOPY WITH MYOSURE N/A 12/06/2020   Procedure: DILATATION & CURETTAGE/DIAGNOSTIC HYSTEROSCOPY/ HYSTEROSCOPIC RESECTION OF ENDOMETRIAL POLYP USING MYOSURE;  Surgeon: Maxie Better, MD;  Location: [facility];  Service: Gynecology;  Laterality: N/A;   EXTRACORPOREAL SHOCK WAVE LITHOTRIPSY  04/18/2013   @[facility]    KNEE ARTHROSCOPY  04/24/2011   Procedure: ARTHROSCOPY KNEE;  Surgeon: 04/26/2011;  Location: [facility];  Service: Orthopedics;  Laterality: Right;  /Arthroscopy with debridement, right knee   LAPAROSCOPIC APPENDECTOMY  03/21/2012   Procedure: APPENDECTOMY LAPAROSCOPIC;  Surgeon: 03/23/2012. Cornett, MD;  Location: [facility];  Service: General;  Laterality: N/A;   URETHRAL STRICTURE DILATATION     age 7   Family History  Problem Relation Age of Onset   Cancer Mother        pancreatic   Arthritis Mother    Hyperlipidemia Mother    Hypertension Mother    Cancer Father        skin   Arthritis Father    Hyperlipidemia Father    Hypertension Father    Diabetes Father    Ankylosing  spondylitis Brother    Spondylitis Brother    Breast cancer Paternal Aunt        59-60   Breast cancer Paternal Aunt        15-60's   Social History   Socioeconomic History   Marital status: Single    Spouse name: Not on file   Number of children: Not on file   Years of education: Not on file   Highest education level: Not on file  Occupational History   Not on file  Tobacco Use   Smoking status: Never   Smokeless tobacco: Never  Vaping Use   Vaping Use: Never used  Substance and Sexual Activity   Alcohol use: No    Alcohol/week: 0.0 standard drinks   Drug use: No   Sexual activity: Not Currently    Birth control/protection: None  Other  Topics Concern   Not on file  Social History Narrative   Not on file   Social Determinants of Health   Financial Resource Strain: Not on file  Food Insecurity: Not on file  Transportation Needs: Not on file  Physical Activity: Not on file  Stress: Not on file  Social Connections: Not on file     Review of Systems  Constitutional:  Negative for appetite change and unexpected weight change.  HENT:  Negative for congestion and sinus pressure.   Respiratory:  Negative for cough, chest tightness and shortness of breath.   Cardiovascular:  Negative for chest pain, palpitations and leg swelling.  Gastrointestinal:  Positive for abdominal pain and constipation. Negative for diarrhea and vomiting.  Genitourinary:  Negative for difficulty urinating and dysuria.  Musculoskeletal:  Negative for joint swelling and myalgias.  Skin:  Negative for color change and rash.  Neurological:  Negative for dizziness, light-headedness and headaches.  Psychiatric/Behavioral:  Negative for agitation and dysphoric mood.       Objective:     BP 136/80   Pulse 73   Temp 97.8 F (36.6 C)   Resp 16   Ht 5\' 5"  (1.651 m)   Wt 228 lb (103.4 kg)   SpO2 99%   BMI 37.94 kg/m  Wt Readings from Last 3 Encounters:  03/31/21 228 lb (103.4 kg)  12/10/20 236 lb (107 kg)  11/10/20 234 lb (106.1 kg)    Physical Exam Vitals reviewed.  Constitutional:      General: She is not in acute distress.    Appearance: Normal appearance.  HENT:     Head: Normocephalic and atraumatic.     Right Ear: External ear normal.     Left Ear: External ear normal.  Eyes:     General: No scleral icterus.       Right eye: No discharge.        Left eye: No discharge.     Conjunctiva/sclera: Conjunctivae normal.  Neck:     Thyroid: No thyromegaly.  Cardiovascular:     Rate and Rhythm: Normal rate and regular rhythm.  Pulmonary:     Effort: No respiratory distress.     Breath sounds: Normal breath sounds. No wheezing.   Abdominal:     General: Bowel sounds are normal.     Palpations: Abdomen is soft.     Comments: Minimal tenderness - no significant increased tenderness to palpation.    Musculoskeletal:        General: No swelling or tenderness.     Cervical back: Neck supple. No tenderness.  Lymphadenopathy:     Cervical: No cervical  adenopathy.  Skin:    Findings: No erythema or rash.  Neurological:     Mental Status: She is alert.  Psychiatric:        Mood and Affect: Mood normal.        Behavior: Behavior normal.     Outpatient Encounter Medications as of 03/31/2021  Medication Sig   Cholecalciferol (VITAMIN D PO) Take 1 capsule by mouth every morning.   Cyanocobalamin (B-12 PO) Take by mouth daily.   ibuprofen (ADVIL) 800 MG tablet Take 1 tablet (800 mg total) by mouth every 8 (eight) hours as needed for moderate pain.   [DISCONTINUED] iron polysaccharides (FERREX 150) 150 MG capsule TAKE 1 CAPSULE (150 MG TOTAL) BY MOUTH 2 TIMES DAILY. (Patient not taking: Reported on 03/31/2021)   No facility-administered encounter medications on file as of 03/31/2021.     Lab Results  Component Value Date   WBC 8.8 03/31/2021   HGB 9.8 (L) 03/31/2021   HCT 32.3 (L) 03/31/2021   PLT 437.0 (H) 03/31/2021   GLUCOSE 80 03/31/2021   CHOL 170 12/10/2020   TRIG 133.0 12/10/2020   HDL 43.50 12/10/2020   LDLCALC 100 (H) 12/10/2020   ALT 13 03/31/2021   AST 17 03/31/2021   NA 138 03/31/2021   K 4.2 03/31/2021   CL 103 03/31/2021   CREATININE 0.71 03/31/2021   BUN 17 03/31/2021   CO2 25 03/31/2021   TSH 1.53 12/10/2020   INR 1.09 07/14/2016       Assessment & Plan:   Problem List Items Addressed This Visit     Abdominal pain    Abdominal pain as outlined.  See if can keep bowels regular.  Check routine labs and KUB.  Pending results, may need CT and/or referral to GI.       Relevant Orders   Hepatic function panel (Completed)   Basic metabolic panel (Completed)   POCT urine pregnancy  (Completed)   DG Abd 1 View (Completed)   Anemia - Primary    S/p D&C Friday 12/06/20.  Still with regular heavy periods.  Off iron - affecting bowels and with current abdominal discomfort.  May need referral to hematology - iron infusion.  Check cbc and iron studies today.       Relevant Orders   CBC with Differential/Platelet (Completed)   IBC + Ferritin (Completed)   IBS (irritable bowel syndrome)    IBS-C.  Discussed trying to keep bowels moving.  Stay hydrated, stool softener, fiber/miralax, etc.  Increased abdominal discomfort as outlined.  Check KUB.  Further w/up pending results.  See if improvement if can get bowels to move.        Migraine    Appears to be stable.  Has been followed by neurology.       Stress    Overall appears to be handling things relatively well.  Follow.         Dale Claflin, MD

## 2021-04-02 ENCOUNTER — Other Ambulatory Visit: Payer: Self-pay | Admitting: Internal Medicine

## 2021-04-02 DIAGNOSIS — D509 Iron deficiency anemia, unspecified: Secondary | ICD-10-CM

## 2021-04-02 NOTE — Progress Notes (Signed)
Order placed for hematology referral.  

## 2021-04-04 ENCOUNTER — Encounter: Payer: Self-pay | Admitting: Internal Medicine

## 2021-04-04 DIAGNOSIS — Z6837 Body mass index (BMI) 37.0-37.9, adult: Secondary | ICD-10-CM

## 2021-04-04 NOTE — Telephone Encounter (Signed)
Order placed for referral to weight management clinic.

## 2021-04-06 ENCOUNTER — Encounter: Payer: Self-pay | Admitting: Internal Medicine

## 2021-04-06 NOTE — Assessment & Plan Note (Signed)
S/p D&C Friday 12/06/20.  Still with regular heavy periods.  Off iron - affecting bowels and with current abdominal discomfort.  May need referral to hematology - iron infusion.  Check cbc and iron studies today.

## 2021-04-06 NOTE — Assessment & Plan Note (Signed)
IBS-C.  Discussed trying to keep bowels moving.  Stay hydrated, stool softener, fiber/miralax, etc.  Increased abdominal discomfort as outlined.  Check KUB.  Further w/up pending results.  See if improvement if can get bowels to move.

## 2021-04-06 NOTE — Assessment & Plan Note (Signed)
Overall appears to be handling things relatively well.  Follow.   

## 2021-04-06 NOTE — Assessment & Plan Note (Signed)
Abdominal pain as outlined.  See if can keep bowels regular.  Check routine labs and KUB.  Pending results, may need CT and/or referral to GI.

## 2021-04-06 NOTE — Assessment & Plan Note (Signed)
Appears to be stable.  Has been followed by neurology.

## 2021-04-11 ENCOUNTER — Other Ambulatory Visit: Payer: Self-pay

## 2021-04-11 ENCOUNTER — Inpatient Hospital Stay: Payer: BC Managed Care – PPO | Attending: Oncology | Admitting: Oncology

## 2021-04-11 ENCOUNTER — Encounter: Payer: Self-pay | Admitting: Oncology

## 2021-04-11 ENCOUNTER — Inpatient Hospital Stay: Payer: BC Managed Care – PPO

## 2021-04-11 VITALS — BP 136/70 | HR 66 | Temp 96.2°F | Resp 20 | Wt 229.9 lb

## 2021-04-11 DIAGNOSIS — K589 Irritable bowel syndrome without diarrhea: Secondary | ICD-10-CM | POA: Diagnosis not present

## 2021-04-11 DIAGNOSIS — Z79899 Other long term (current) drug therapy: Secondary | ICD-10-CM | POA: Diagnosis not present

## 2021-04-11 DIAGNOSIS — D509 Iron deficiency anemia, unspecified: Secondary | ICD-10-CM | POA: Insufficient documentation

## 2021-04-11 DIAGNOSIS — G43909 Migraine, unspecified, not intractable, without status migrainosus: Secondary | ICD-10-CM | POA: Diagnosis not present

## 2021-04-11 NOTE — Progress Notes (Signed)
Hematology/Oncology Consult note [facility] Telephone:([phone] [phone]  Patient Care Team: Dale Packwood, MD as PCP - General (Internal Medicine)   Name of the patient: Tina Fleming  [id number]  05-Nov-1980    Reason for referral-iron deficiency anemia   Referring physician-Dr. Lorin Picket  Date of visit: 04/11/21   History of presenting illness- Patient is a 40 year old female with history of migraine and irritable bowel syndrome who has been referred for iron deficiency anemia.  Most recent CBC from 03/31/2021 showed white count of 9.8, H&H of 9.8/32.3 with an MCV of 64 and a platelet count of 437.  Iron studies showed a low ferritin of 3.3 with an elevated TIBC of 501 and iron saturation of 3.8%.  Looking back at her prior CBCs patient's hemoglobin was closer to 12 back in 2020 but it has fluctuated between 9-11 over the years with persistent microcytosis.  Patient had hysteroscopy and D&C for DU B back in July 2022.  Since then her menstrual cycles last for about 5 days but the first 3 days are particularly heavy.  She is unable to use any estrogen based hormone therapy due to her history of migraines.  Concern for weight gain with progesterone-based treatments.  Currently reports some baseline fatigue.  Occasionally feels dizzy but has not had any syncopal episodes.  Denies any bleeding in her stool or urine.  Denies any dark melanotic stools.  Denies any consistent use of NSAIDs.  She is unable to tolerate oral iron due to constipation  ECOG PS- 0  Pain scale- 0   Review of systems- Review of Systems  Constitutional:  Negative for chills, fever, malaise/fatigue and weight loss.  HENT:  Negative for congestion, ear discharge and nosebleeds.   Eyes:  Negative for blurred vision.  Respiratory:  Negative for cough, hemoptysis, sputum production, shortness of breath and wheezing.   Cardiovascular:  Negative for chest pain, palpitations, orthopnea  and claudication.  Gastrointestinal:  Negative for abdominal pain, blood in stool, constipation, diarrhea, heartburn, melena, nausea and vomiting.  Genitourinary:  Negative for dysuria, flank pain, frequency, hematuria and urgency.  Musculoskeletal:  Negative for back pain, joint pain and myalgias.  Skin:  Negative for rash.  Neurological:  Negative for dizziness, tingling, focal weakness, seizures, weakness and headaches.  Endo/Heme/Allergies:  Does not bruise/bleed easily.  Psychiatric/Behavioral:  Negative for depression and suicidal ideas. The patient does not have insomnia.    Allergies  Allergen Reactions   Sulfa Antibiotics Rash    Patient Active Problem List   Diagnosis Date Noted   Abdominal pain 03/31/2021   Traumatic ecchymosis of right lower leg 03/23/2020   Hematoma of right lower extremity 03/23/2020   Nausea 08/21/2019   Abdominal bloating 08/21/2019   History of COVID-19 06/24/2019   URI (upper respiratory infection) 08/26/2018   Thyroid nodule 02/27/2018   Intractable migraine with aura with status migrainosus 05/14/2017   Stress 01/10/2017   DUB (dysfunctional uterine bleeding) 07/16/2016   Low back pain 04/30/2015   Fatigue 02/08/2015   Skin lesion of breast 08/06/2014   Health care maintenance 08/06/2014   Nephrolithiasis 02/11/2013   Migraine 02/11/2013   History of frequent urinary tract infections 02/11/2013   Syncope 02/11/2013   IBS (irritable bowel syndrome) 02/11/2013   Anemia 02/11/2013   Vitamin D deficiency 02/11/2013     Past Medical History:  Diagnosis Date   Bilateral occipital neuralgia    Complication of anesthesia    hard to wake due to  prolonged sedation   DUB (dysfunctional uterine bleeding)    Heart murmur    per pt asymtomatic, dx age 40 and told had normal echo   History of 2019 novel coronavirus disease (COVID-19) 05/2018   per pt mild symptoms that resolved   History of kidney stones    History of urethral stricture     age 40 s/p dilatation   IBS (irritable bowel syndrome)    IDA (iron deficiency anemia)    Intractable migraine with aura without status migrainosus    neruologist--- dr h. Manuella Ghazi   Thyroid nodule    per pt had biopsy of nodule approx. 2019 or 2020 , benign , and no follow up needed   Vitamin D deficiency      Past Surgical History:  Procedure Laterality Date   DILATATION & CURETTAGE/HYSTEROSCOPY WITH MYOSURE N/A 03/20/2016   Procedure: DILATATION & CURETTAGE/HYSTEROSCOPY WITH MYOSURE;  Surgeon: Servando Salina, MD;  Location: [facility];  Service: Gynecology;  Laterality: N/A;  45 min. requested   DILATATION & CURETTAGE/HYSTEROSCOPY WITH MYOSURE N/A 12/06/2020   Procedure: DILATATION & CURETTAGE/DIAGNOSTIC HYSTEROSCOPY/ HYSTEROSCOPIC RESECTION OF ENDOMETRIAL POLYP USING MYOSURE;  Surgeon: Servando Salina, MD;  Location: [place];  Service: Gynecology;  Laterality: N/A;   EXTRACORPOREAL SHOCK WAVE LITHOTRIPSY  04/18/2013   @[facility]    KNEE ARTHROSCOPY  04/24/2011   Procedure: ARTHROSCOPY KNEE;  Surgeon: Johnn Hai;  Location: [place];  Service: Orthopedics;  Laterality: Right;  /Arthroscopy with debridement, right knee   LAPAROSCOPIC APPENDECTOMY  03/21/2012   Procedure: APPENDECTOMY LAPAROSCOPIC;  Surgeon: Joyice Faster. Cornett, MD;  Location: [facility];  Service: General;  Laterality: N/A;   URETHRAL STRICTURE DILATATION     age 40    Social History   Socioeconomic History   Marital status: Single    Spouse name: Not on file   Number of children: Not on file   Years of education: Not on file   Highest education level: Not on file  Occupational History   Not on file  Tobacco Use   Smoking status: Never   Smokeless tobacco: Never  Vaping Use   Vaping Use: Never used  Substance and Sexual Activity   Alcohol use: No    Alcohol/week: 0.0 standard drinks   Drug use: No   Sexual activity: Not Currently    Birth control/protection: None  Other Topics  Concern   Not on file  Social History Narrative   Not on file   Social Determinants of Health   Financial Resource Strain: Not on file  Food Insecurity: Not on file  Transportation Needs: Not on file  Physical Activity: Not on file  Stress: Not on file  Social Connections: Not on file  Intimate Partner Violence: Not on file     Family History  Problem Relation Age of Onset   Cancer Mother        pancreatic   Arthritis Mother    Hyperlipidemia Mother    Hypertension Mother    Cancer Father        skin   Arthritis Father    Hyperlipidemia Father    Hypertension Father    Diabetes Father    Ankylosing spondylitis Brother    Spondylitis Brother    Breast cancer Paternal Aunt        25-60   Breast cancer Paternal Aunt        58-60's     Current Outpatient Medications:    Cholecalciferol (VITAMIN D PO),  Take 1 capsule by mouth every morning., Disp: , Rfl:    Cyanocobalamin (B-12 PO), Take by mouth daily., Disp: , Rfl:    ibuprofen (ADVIL) 800 MG tablet, Take 1 tablet (800 mg total) by mouth every 8 (eight) hours as needed for moderate pain., Disp: 30 tablet, Rfl: 5   Physical exam:  Vitals:   04/11/21 1049  BP: 136/70  Pulse: 66  Resp: 20  Temp: (!) 96.2 F (35.7 C)  SpO2: 100%  Weight: 229 lb 14.4 oz (104.3 kg)   Physical Exam Cardiovascular:     Rate and Rhythm: Normal rate and regular rhythm.     Heart sounds: Normal heart sounds.  Pulmonary:     Effort: Pulmonary effort is normal.     Breath sounds: Normal breath sounds.  Abdominal:     General: Bowel sounds are normal.     Palpations: Abdomen is soft.  Skin:    General: Skin is warm and dry.  Neurological:     Mental Status: She is alert and oriented to person, place, and time.       CMP Latest Ref Rng & Units 03/31/2021  Glucose 70 - 99 mg/dL 80  BUN 6 - 23 mg/dL 17  Creatinine 0.40 - 1.20 mg/dL 0.71  Sodium 135 - 145 mEq/L 138  Potassium 3.5 - 5.1 mEq/L 4.2  Chloride 96 - 112 mEq/L 103   CO2 19 - 32 mEq/L 25  Calcium 8.4 - 10.5 mg/dL 9.8  Total Protein 6.0 - 8.3 g/dL 7.2  Total Bilirubin 0.2 - 1.2 mg/dL 0.4  Alkaline Phos 39 - 117 U/L 83  AST 0 - 37 U/L 17  ALT 0 - 35 U/L 13   CBC Latest Ref Rng & Units 03/31/2021  WBC 4.0 - 10.5 K/uL 8.8  Hemoglobin 12.0 - 15.0 g/dL 9.8(L)  Hematocrit 36.0 - 46.0 % 32.3(L)  Platelets 150.0 - 400.0 K/uL 437.0(H)    No images are attached to the encounter.  DG Abd 1 View  Result Date: 04/01/2021 CLINICAL DATA:  Abdominal pain.  History of constipation. EXAM: ABDOMEN - 1 VIEW COMPARISON:  Abdominopelvic CT 04/17/2013 FINDINGS: 5 mm right renal calculus in the region of the renal pelvis. Additional smaller stone projects over the lower right kidney. No visualized ureteral stones. Pelvis is not included in the field of view, limiting assessment for bladder stone. Normal bowel gas pattern. Small volume of colonic stool, not suggestive of constipation. No bowel dilatation or obstruction slight scoliotic curvature of the spine. No acute osseous abnormalities are seen. IMPRESSION: 1. Normal bowel gas pattern. No radiographic evidence of constipation. 2. Right renal calculi, largest measuring 5 mm in the region of the renal pelvis. Electronically Signed   By: Keith Rake M.D.   On: 04/01/2021 19:26    Assessment and plan- Patient is a 40 y.o. female referred for iron deficiency anemia likely secondary to menorrhagia  Etiology of iron deficiency anemia likely secondary to menorrhagia.  Unfortunately patient is not able to receive any medical management due to her history of migraines and concern for weight gain.  She is unable to tolerate oral iron due to constipation.  I therefore recommend IV iron Venofer 200 mg x 5 doses given in the next 2 to 3 weeks.  Discussed risks and benefits of IV iron including all but not limited to headache, nausea, possible risk of infusion reaction.  Patient understands and agrees to proceed as planned.  I will  see her back tentatively in  2-1/2 months with CBC ferritin and iron studies B12 and folate prior for a video visit   Thank you for this kind referral and the opportunity to participate in the care of this patient   Visit Diagnosis 1. Iron deficiency anemia, unspecified iron deficiency anemia type     Dr. Randa Evens, MD, MPH [facility] [id number] 04/11/2021

## 2021-04-12 ENCOUNTER — Encounter: Payer: Self-pay | Admitting: *Deleted

## 2021-04-16 NOTE — Telephone Encounter (Signed)
Order placed for referral to [facility].   

## 2021-04-24 ENCOUNTER — Inpatient Hospital Stay: Payer: BC Managed Care – PPO | Attending: Oncology

## 2021-04-24 DIAGNOSIS — D509 Iron deficiency anemia, unspecified: Secondary | ICD-10-CM | POA: Insufficient documentation

## 2021-04-24 DIAGNOSIS — Z79899 Other long term (current) drug therapy: Secondary | ICD-10-CM | POA: Insufficient documentation

## 2021-04-28 ENCOUNTER — Inpatient Hospital Stay: Payer: BC Managed Care – PPO

## 2021-04-28 ENCOUNTER — Other Ambulatory Visit: Payer: Self-pay

## 2021-04-28 VITALS — BP 117/68 | HR 58 | Temp 97.7°F | Resp 16

## 2021-04-28 DIAGNOSIS — D509 Iron deficiency anemia, unspecified: Secondary | ICD-10-CM | POA: Diagnosis not present

## 2021-04-28 DIAGNOSIS — Z79899 Other long term (current) drug therapy: Secondary | ICD-10-CM | POA: Diagnosis not present

## 2021-04-28 MED ORDER — SODIUM CHLORIDE 0.9 % IV SOLN
Freq: Once | INTRAVENOUS | Status: AC
  Filled 2021-04-28: qty 250

## 2021-04-28 MED ORDER — SODIUM CHLORIDE 0.9 % IV SOLN: 200.0000 mg | INTRAVENOUS | Status: DC

## 2021-04-28 MED ORDER — IRON SUCROSE 20 MG/ML IV SOLN
200.0000 mg | Freq: Once | INTRAVENOUS | Status: AC
  Administered 2021-04-28: 200 mg via INTRAVENOUS
  Filled 2021-04-28: qty 10

## 2021-04-28 NOTE — Patient Instructions (Signed)
[facility]   ?Discharge Instructions: ?Thank you for choosing [facility] to provide your oncology and hematology care.  ?If you have a lab appointment with the Cancer Center, please go directly to the Cancer Center and check in at the registration area. ?  ?We strive to give you quality time with your provider. You may need to reschedule your appointment if you arrive late (15 or more minutes).  Arriving late affects you and other patients whose appointments are after yours.  Also, if you miss three or more appointments without notifying the office, you may be dismissed from the clinic at the provider?s discretion.    ?  ?For prescription refill requests, have your pharmacy contact our office and allow 72 hours for refills to be completed.   ? ?Today you received the following: Venofer.    ?  ?BELOW ARE SYMPTOMS THAT SHOULD BE REPORTED IMMEDIATELY: ?*FEVER GREATER THAN 100.4 F (38 ?C) OR HIGHER ?*CHILLS OR SWEATING ?*NAUSEA AND VOMITING THAT IS NOT CONTROLLED WITH YOUR NAUSEA MEDICATION ?*UNUSUAL SHORTNESS OF BREATH ?*UNUSUAL BRUISING OR BLEEDING ?*URINARY PROBLEMS (pain or burning when urinating, or frequent urination) ?*BOWEL PROBLEMS (unusual diarrhea, constipation, pain near the anus) ?TENDERNESS IN MOUTH AND THROAT WITH OR WITHOUT PRESENCE OF ULCERS (sore throat, sores in mouth, or a toothache) ?UNUSUAL RASH, SWELLING OR PAIN  ?UNUSUAL VAGINAL DISCHARGE OR ITCHING  ? ?Items with * indicate a potential emergency and should be followed up as soon as possible or go to the Emergency Department if any problems should occur. ? ?Should you have questions after your visit or need to cancel or reschedule your appointment, please contact [facility]  Dept: [phone]  and follow the prompts.  Office hours are 8:00 a.m. to 4:30 p.m. Monday - Friday. Please note that voicemails left after 4:00 p.m. may not be returned until the following  business day.  We are closed weekends and major holidays. You have access to a nurse at all times for urgent questions. Please call the main number to the clinic Dept: [phone] and follow the prompts. ? ?For any non-urgent questions, you may also contact your provider using MyChart. We now offer e-Visits for anyone 18 and older to request care online for non-urgent symptoms. For details visit [email or web address]. ?  ?Also download the MyChart app! Go to the app store, search "MyChart", open the app, select [facility], and log in with your MyChart username and password. ? ?Due to Covid, a mask is required upon entering the hospital/clinic. If you do not have a mask, one will be given to you upon arrival. For doctor visits, patients may have 1 support person aged 18 or older with them. For treatment visits, patients cannot have anyone with them due to current Covid guidelines and our immunocompromised population.  ?

## 2021-05-02 ENCOUNTER — Inpatient Hospital Stay: Payer: BC Managed Care – PPO

## 2021-05-02 ENCOUNTER — Other Ambulatory Visit: Payer: Self-pay

## 2021-05-02 VITALS — BP 130/84 | HR 68 | Temp 96.6°F | Resp 19

## 2021-05-02 DIAGNOSIS — Z79899 Other long term (current) drug therapy: Secondary | ICD-10-CM | POA: Diagnosis not present

## 2021-05-02 DIAGNOSIS — D509 Iron deficiency anemia, unspecified: Secondary | ICD-10-CM | POA: Diagnosis not present

## 2021-05-02 MED ORDER — IRON SUCROSE 20 MG/ML IV SOLN
200.0000 mg | Freq: Once | INTRAVENOUS | Status: AC
  Administered 2021-05-02: 200 mg via INTRAVENOUS
  Filled 2021-05-02: qty 10

## 2021-05-02 MED ORDER — SODIUM CHLORIDE 0.9 % IV SOLN: 200.0000 mg | INTRAVENOUS | Status: DC

## 2021-05-02 MED ORDER — SODIUM CHLORIDE 0.9 % IV SOLN
Freq: Once | INTRAVENOUS | Status: AC
  Filled 2021-05-02: qty 250

## 2021-05-02 NOTE — Patient Instructions (Signed)

## 2021-05-05 ENCOUNTER — Inpatient Hospital Stay: Payer: BC Managed Care – PPO

## 2021-05-05 ENCOUNTER — Other Ambulatory Visit: Payer: Self-pay

## 2021-05-05 VITALS — BP 126/78 | HR 69 | Temp 98.3°F | Resp 18

## 2021-05-05 DIAGNOSIS — D509 Iron deficiency anemia, unspecified: Secondary | ICD-10-CM

## 2021-05-05 DIAGNOSIS — Z79899 Other long term (current) drug therapy: Secondary | ICD-10-CM | POA: Diagnosis not present

## 2021-05-05 MED ORDER — SODIUM CHLORIDE 0.9 % IV SOLN: 200.0000 mg | INTRAVENOUS | Status: DC

## 2021-05-05 MED ORDER — IRON SUCROSE 20 MG/ML IV SOLN
200.0000 mg | Freq: Once | INTRAVENOUS | Status: AC
  Administered 2021-05-05: 200 mg via INTRAVENOUS
  Filled 2021-05-05: qty 10

## 2021-05-05 MED ORDER — SODIUM CHLORIDE 0.9 % IV SOLN
Freq: Once | INTRAVENOUS | Status: AC
  Filled 2021-05-05: qty 250

## 2021-05-05 NOTE — Patient Instructions (Signed)

## 2021-05-07 ENCOUNTER — Encounter: Payer: Self-pay | Admitting: Oncology

## 2021-05-07 ENCOUNTER — Telehealth: Payer: Self-pay | Admitting: Oncology

## 2021-05-07 NOTE — Telephone Encounter (Signed)
Pt called in to reschedule her appt for 12-16. Call back at [phone]

## 2021-05-09 ENCOUNTER — Inpatient Hospital Stay: Payer: BC Managed Care – PPO

## 2021-05-12 ENCOUNTER — Other Ambulatory Visit: Payer: Self-pay

## 2021-05-12 ENCOUNTER — Inpatient Hospital Stay: Payer: BC Managed Care – PPO

## 2021-05-12 VITALS — BP 117/74 | HR 61 | Temp 98.1°F | Resp 16

## 2021-05-12 DIAGNOSIS — D509 Iron deficiency anemia, unspecified: Secondary | ICD-10-CM

## 2021-05-12 DIAGNOSIS — Z79899 Other long term (current) drug therapy: Secondary | ICD-10-CM | POA: Diagnosis not present

## 2021-05-12 MED ORDER — IRON SUCROSE 20 MG/ML IV SOLN
200.0000 mg | Freq: Once | INTRAVENOUS | Status: AC
  Administered 2021-05-12: 15:00:00 200 mg via INTRAVENOUS
  Filled 2021-05-12: qty 10

## 2021-05-12 MED ORDER — SODIUM CHLORIDE 0.9 % IV SOLN
Freq: Once | INTRAVENOUS | Status: AC
  Filled 2021-05-12: qty 250

## 2021-05-12 MED ORDER — SODIUM CHLORIDE 0.9 % IV SOLN: 200.0000 mg | INTRAVENOUS | Status: DC

## 2021-05-12 NOTE — Progress Notes (Signed)
Patient c/o Nausea, Abdominal Bloating, and Abdominal Pain post infusion. Patient states it starts one day post infusion and last all day. Patient reports it is tolerable. Patient states that she is okay with proceeding with infusions. Mignon Pine, NP made aware. Per Sung Amabile, RN per Mignon Pine, NP, okay to proceed with Venofer treatment today.

## 2021-05-20 ENCOUNTER — Telehealth: Payer: Self-pay | Admitting: Oncology

## 2021-05-20 NOTE — Telephone Encounter (Signed)
Pt called to reschedule her appt for 06-30-21. Call back at [phone]

## 2021-05-21 ENCOUNTER — Inpatient Hospital Stay: Payer: BC Managed Care – PPO

## 2021-05-21 ENCOUNTER — Other Ambulatory Visit: Payer: Self-pay

## 2021-05-21 VITALS — BP 105/61 | HR 64 | Temp 96.9°F

## 2021-05-21 DIAGNOSIS — D509 Iron deficiency anemia, unspecified: Secondary | ICD-10-CM | POA: Diagnosis not present

## 2021-05-21 DIAGNOSIS — Z79899 Other long term (current) drug therapy: Secondary | ICD-10-CM | POA: Diagnosis not present

## 2021-05-21 MED ORDER — IRON SUCROSE 20 MG/ML IV SOLN
200.0000 mg | Freq: Once | INTRAVENOUS | Status: AC
  Administered 2021-05-21: 14:00:00 200 mg via INTRAVENOUS
  Filled 2021-05-21: qty 10

## 2021-05-21 MED ORDER — SODIUM CHLORIDE 0.9 % IV SOLN
Freq: Once | INTRAVENOUS | Status: AC
  Filled 2021-05-21: qty 250

## 2021-05-21 MED ORDER — SODIUM CHLORIDE 0.9 % IV SOLN: 200.0000 mg | INTRAVENOUS | Status: DC

## 2021-05-21 NOTE — Patient Instructions (Signed)

## 2021-06-11 ENCOUNTER — Other Ambulatory Visit: Payer: Self-pay | Admitting: *Deleted

## 2021-06-11 DIAGNOSIS — D509 Iron deficiency anemia, unspecified: Secondary | ICD-10-CM

## 2021-06-12 ENCOUNTER — Ambulatory Visit: Payer: BC Managed Care – PPO | Admitting: Internal Medicine

## 2021-06-13 ENCOUNTER — Other Ambulatory Visit: Payer: Self-pay

## 2021-06-13 ENCOUNTER — Ambulatory Visit: Payer: BC Managed Care – PPO | Admitting: Internal Medicine

## 2021-06-13 DIAGNOSIS — E041 Nontoxic single thyroid nodule: Secondary | ICD-10-CM

## 2021-06-13 DIAGNOSIS — N200 Calculus of kidney: Secondary | ICD-10-CM | POA: Diagnosis not present

## 2021-06-13 DIAGNOSIS — D509 Iron deficiency anemia, unspecified: Secondary | ICD-10-CM

## 2021-06-13 DIAGNOSIS — R5383 Other fatigue: Secondary | ICD-10-CM

## 2021-06-13 DIAGNOSIS — K581 Irritable bowel syndrome with constipation: Secondary | ICD-10-CM | POA: Diagnosis not present

## 2021-06-13 DIAGNOSIS — N938 Other specified abnormal uterine and vaginal bleeding: Secondary | ICD-10-CM

## 2021-06-13 DIAGNOSIS — F439 Reaction to severe stress, unspecified: Secondary | ICD-10-CM

## 2021-06-13 NOTE — Progress Notes (Signed)
Patient ID: [name], female   DOB: 05-20-1981, 41 y.o.   MRN: [id number]   Subjective:    Patient ID: Tina Fleming, female    DOB: April 04, 1981, 41 y.o.   MRN: [id number]  This visit occurred during the SARS-CoV-2 public health emergency.  Safety protocols were in place, including screening questions prior to the visit, additional usage of staff PPE, and extensive cleaning of exam room while observing appropriate contact time as indicated for disinfecting solutions.   Patient here for a scheduled follow up. Marland Kitchen   HPI Here to follow up regarding her anemia, increased stress and constipation.  She is seeing hematology now.  Receiving IV iron infusions.  Has had some issues with tolerance (infusions).  Noticed stomach pain after receiving infusions. Changed to once per week.  Due f/u next week. Will have cbc and iron rechecked.  Still has noticed decreased energy.  S/p hysteroscopy and D&C 11/2020.  Periods regular.  Heavy for the first couple of days.  Increased stress.  Aunt with breast cancer. She is helping to make decisions about her care.  She is back walking.  Due to meet with the dietician next week. Taking pre and probiotics and fiber gummies.  Helped bowels.  History of kidney stones.  60mm stone seen on recent CT.  Scheduled to see urology 06/2021.     Past Medical History:  Diagnosis Date   Bilateral occipital neuralgia    Complication of anesthesia    hard to wake due to  prolonged sedation   DUB (dysfunctional uterine bleeding)    Heart murmur    per pt asymtomatic, dx age 88 and told had normal echo   History of 2019 novel coronavirus disease (COVID-19) 05/2018   per pt mild symptoms that resolved   History of kidney stones    History of urethral stricture    age 46 s/p dilatation   IBS (irritable bowel syndrome)    IDA (iron deficiency anemia)    Intractable migraine with aura without status migrainosus    neruologist--- dr h. Sherryll Burger   Thyroid nodule    per pt had biopsy  of nodule approx. 2019 or 2020 , benign , and no follow up needed   Vitamin D deficiency    Past Surgical History:  Procedure Laterality Date   DILATATION & CURETTAGE/HYSTEROSCOPY WITH MYOSURE N/A 03/20/2016   Procedure: DILATATION & CURETTAGE/HYSTEROSCOPY WITH MYOSURE;  Surgeon: Maxie Better, MD;  Location: [facility];  Service: Gynecology;  Laterality: N/A;  45 min. requested   DILATATION & CURETTAGE/HYSTEROSCOPY WITH MYOSURE N/A 12/06/2020   Procedure: DILATATION & CURETTAGE/DIAGNOSTIC HYSTEROSCOPY/ HYSTEROSCOPIC RESECTION OF ENDOMETRIAL POLYP USING MYOSURE;  Surgeon: Maxie Better, MD;  Location: [facility] [place];  Service: Gynecology;  Laterality: N/A;   EXTRACORPOREAL SHOCK WAVE LITHOTRIPSY  04/18/2013   @[facility]    KNEE ARTHROSCOPY  04/24/2011   Procedure: ARTHROSCOPY KNEE;  Surgeon: Javier Docker;  Location: [facility];  Service: Orthopedics;  Laterality: Right;  /Arthroscopy with debridement, right knee   LAPAROSCOPIC APPENDECTOMY  03/21/2012   Procedure: APPENDECTOMY LAPAROSCOPIC;  Surgeon: Clovis Pu. Cornett, MD;  Location: [facility];  Service: General;  Laterality: N/A;   URETHRAL STRICTURE DILATATION     age 34   Family History  Problem Relation Age of Onset   Cancer Mother        pancreatic   Arthritis Mother    Hyperlipidemia Mother    Hypertension Mother    Cancer Father  skin   Arthritis Father    Hyperlipidemia Father    Hypertension Father    Diabetes Father    Ankylosing spondylitis Brother    Spondylitis Brother    Breast cancer Paternal Aunt        53-60   Breast cancer Paternal Aunt        58-60's   Social History   Socioeconomic History   Marital status: Single    Spouse name: Not on file   Number of children: Not on file   Years of education: Not on file   Highest education level: Not on file  Occupational History   Not on file  Tobacco Use   Smoking status: Never   Smokeless tobacco: Never  Vaping Use   Vaping  Use: Never used  Substance and Sexual Activity   Alcohol use: No    Alcohol/week: 0.0 standard drinks   Drug use: No   Sexual activity: Not Currently    Birth control/protection: None  Other Topics Concern   Not on file  Social History Narrative   Not on file   Social Determinants of Health   Financial Resource Strain: Not on file  Food Insecurity: Not on file  Transportation Needs: Not on file  Physical Activity: Not on file  Stress: Not on file  Social Connections: Not on file     Review of Systems  Constitutional:  Positive for fatigue. Negative for appetite change and unexpected weight change.  HENT:  Negative for congestion and sinus pressure.   Respiratory:  Negative for cough, chest tightness and shortness of breath.   Cardiovascular:  Negative for chest pain, palpitations and leg swelling.  Gastrointestinal:  Negative for abdominal pain, nausea and vomiting.  Genitourinary:  Negative for difficulty urinating and dysuria.  Musculoskeletal:  Negative for joint swelling and myalgias.  Skin:  Negative for color change and rash.  Neurological:  Negative for dizziness, light-headedness and headaches.  Psychiatric/Behavioral:  Negative for agitation and dysphoric mood.        Increased stress.        Objective:     BP 120/70    Pulse 66    Temp 97.6 F (36.4 C)    Resp 16    Ht 5\' 5"  (1.651 m)    Wt 236 lb (107 kg)    SpO2 99%    BMI 39.27 kg/m  Wt Readings from Last 3 Encounters:  06/13/21 236 lb (107 kg)  04/11/21 229 lb 14.4 oz (104.3 kg)  03/31/21 228 lb (103.4 kg)    Physical Exam Vitals reviewed.  Constitutional:      General: She is not in acute distress.    Appearance: Normal appearance.  HENT:     Head: Normocephalic and atraumatic.     Right Ear: External ear normal.     Left Ear: External ear normal.  Eyes:     General: No scleral icterus.       Right eye: No discharge.        Left eye: No discharge.     Conjunctiva/sclera: Conjunctivae  normal.  Neck:     Thyroid: No thyromegaly.  Cardiovascular:     Rate and Rhythm: Normal rate and regular rhythm.  Pulmonary:     Effort: No respiratory distress.     Breath sounds: Normal breath sounds. No wheezing.  Abdominal:     General: Bowel sounds are normal.     Palpations: Abdomen is soft.     Tenderness: There is  no abdominal tenderness.  Musculoskeletal:        General: No swelling or tenderness.     Cervical back: Neck supple. No tenderness.  Lymphadenopathy:     Cervical: No cervical adenopathy.  Skin:    Findings: No erythema or rash.  Neurological:     Mental Status: She is alert.  Psychiatric:        Mood and Affect: Mood normal.        Behavior: Behavior normal.     Outpatient Encounter Medications as of 06/13/2021  Medication Sig   Cholecalciferol (VITAMIN D PO) Take 1 capsule by mouth every morning.   Cyanocobalamin (B-12 PO) Take by mouth daily.   ibuprofen (ADVIL) 800 MG tablet Take 1 tablet (800 mg total) by mouth every 8 (eight) hours as needed for moderate pain.   No facility-administered encounter medications on file as of 06/13/2021.     Lab Results  Component Value Date   WBC 8.8 03/31/2021   HGB 9.8 (L) 03/31/2021   HCT 32.3 (L) 03/31/2021   PLT 437.0 (H) 03/31/2021   GLUCOSE 80 03/31/2021   CHOL 170 12/10/2020   TRIG 133.0 12/10/2020   HDL 43.50 12/10/2020   LDLCALC 100 (H) 12/10/2020   ALT 13 03/31/2021   AST 17 03/31/2021   NA 138 03/31/2021   K 4.2 03/31/2021   CL 103 03/31/2021   CREATININE 0.71 03/31/2021   BUN 17 03/31/2021   CO2 25 03/31/2021   TSH 1.53 12/10/2020   INR 1.09 07/14/2016       Assessment & Plan:   Problem List Items Addressed This Visit     DUB (dysfunctional uterine bleeding)    S/p D&C.  Periods as outlined.  Continue f/u with gyn.       Fatigue    Felt to be multifactorial.  Received iron infusions.  Due to have f/u cbc and iron studies next week.  Back to walking.  Increased stress.  Follow.        IBS (irritable bowel syndrome)    IBS-C.  Taking pre and probiotics.  Taking fiber gummies.  Has helped.  Follow.         Iron deficiency anemia    Seeing Dr Janese Banks.  Constipation with oral iron.  Receiving iron infusions.  Some intolerance to the infusions as outlined.  Continue f/u with Dr Janese Banks. Has f/u planned next week.        Nephrolithiasis    Has a history of kidney stones.  Recent CT - 26mm stone.  Has appt with urology 06/2021.       Stress    Increased stress as outlined.  Discussed.  Overall appears to be handling things relatively well.  Follow.        Thyroid nodule    Saw Dr Gabriel Carina.  S/p biopsy.  Pt reports no change and that no further w/up warranted.  Last evaluated 10/12/18 by endocrinology.  Follow thyroid function.            Einar Pheasant, MD

## 2021-06-13 NOTE — Assessment & Plan Note (Addendum)
Seeing Dr Smith Robert.  Constipation with oral iron.  Receiving iron infusions.  Some intolerance to the infusions as outlined.  Continue f/u with Dr Smith Robert. Has f/u planned next week.

## 2021-06-16 ENCOUNTER — Encounter: Payer: Self-pay | Admitting: Internal Medicine

## 2021-06-16 ENCOUNTER — Other Ambulatory Visit: Payer: Self-pay

## 2021-06-16 ENCOUNTER — Encounter: Payer: BC Managed Care – PPO | Attending: Internal Medicine | Admitting: Dietician

## 2021-06-16 VITALS — Ht 65.0 in | Wt 236.5 lb

## 2021-06-16 DIAGNOSIS — E669 Obesity, unspecified: Secondary | ICD-10-CM | POA: Diagnosis not present

## 2021-06-16 DIAGNOSIS — Z6839 Body mass index (BMI) 39.0-39.9, adult: Secondary | ICD-10-CM | POA: Diagnosis not present

## 2021-06-16 DIAGNOSIS — D509 Iron deficiency anemia, unspecified: Secondary | ICD-10-CM | POA: Diagnosis not present

## 2021-06-16 DIAGNOSIS — K581 Irritable bowel syndrome with constipation: Secondary | ICD-10-CM | POA: Diagnosis not present

## 2021-06-16 NOTE — Assessment & Plan Note (Signed)
Increased stress as outlined.  Discussed.  Overall appears to be handling things relatively well.  Follow.  

## 2021-06-16 NOTE — Assessment & Plan Note (Signed)
Has a history of kidney stones.  Recent CT - 61mm stone.  Has appt with urology 06/2021.

## 2021-06-16 NOTE — Assessment & Plan Note (Addendum)
S/p D&C.  Periods as outlined.  Continue f/u with gyn.

## 2021-06-16 NOTE — Assessment & Plan Note (Signed)
Saw Dr Solum.  S/p biopsy.  Pt reports no change and that no further w/up warranted.  Last evaluated 10/12/18 by endocrinology.  Follow thyroid function.     

## 2021-06-16 NOTE — Assessment & Plan Note (Signed)
Felt to be multifactorial.  Received iron infusions.  Due to have f/u cbc and iron studies next week.  Back to walking.  Increased stress.  Follow.

## 2021-06-16 NOTE — Patient Instructions (Signed)
Continue to monitor caloric intake, aiming for about 1400kcal daily on average. If physical activity increases or decreases, adjust calories accordingly. Keep up regular exercise, great job!

## 2021-06-16 NOTE — Assessment & Plan Note (Signed)
IBS-C.  Taking pre and probiotics.  Taking fiber gummies.  Has helped.  Follow.

## 2021-06-16 NOTE — Progress Notes (Signed)
Medical Nutrition Therapy: Visit start time: 1100  end time: 1200  Assessment:  Diagnosis: obesity Past medical history: iron deficiency anemia, B12 and D deficiencies, IBS-C Psychosocial issues/ stress concerns: none  Preferred learning method:  Auditory-- discussion Visual Hands-on   Current weight: 236.5lbs Height: 5'5" BMI: 39.36  Medications, supplements: finished series of iron infusions 04/2021; taking B12 and vitamin D orally, as well as pre and probiotic supplement and fiber supplement  Progress and evaluation:  Tried weight watchers program in the past, was eating 1100-1200kcal but was having excessive hunger symptoms. Last year increased protein and decreased carbs but then began having excess bleeding due to uterine polyps. Had surgical removal. Lost down to about 168lbs likely 1200kcal per pt when age [age], but was walking much more on the job.  Some recent stress eating due to caring for aunt with end stage cancer; some fast food due to schedule disruptions  Patient would like help with determining best kcal level to aim for steady weight loss. She is using a tracking app to monitor.  She plans to have further testing done on hormone levels, etc if kcal control does not lead to weight loss.  Physical activity: walking, cycling 30-45 minutes, 4 times a week  Dietary Intake:  Usual eating pattern includes 3 meals and 1-2 snacks per day. Dining out frequency: 2 meals per week.  Breakfast: sometimes high-protein coffee before or during class; protein shake (140kcal) + 1/2 sourdough bagel thin with fat free cheese, center cut bacon, 1 egg Snack: none Lunch: 12-1pm -- takeout -- looks for low kcal options ie skinny biscuit with grilled chicken and hashbrown; grilled chicken sandwich or nuggets from [employer] Snack: none Supper: tries to wait until 6pm to limit evening snacking; 1/23 -- grilled chicken center cut bacon, squash, beans Snack: cheese/ protein bar- brownie/  [place] yogurt with sf jello or skinny syrup Beverages: does not like plain water; drinks 40oz container water with 1 sf flavoring packet added 2-3x daily  Nutrition Care Education: Topics covered:  Basic nutrition: appropriate nutrient balance, general nutrition guidelines    Weight control: importance of low sugar and low fat choices, estimated energy needs for weight loss at 1400-1600kcal, provided guidance for 40% CHO, 30% pro, 30% fat; advised unrefined carb choices and gradual increase in carb intake; discussed effects of stress on weight and finding non-food stress management strategies; importance of regular exercise in maintaining healthy metabolic rate Iron deficiency anemia:  iron rich foods, consuming high vitamin c foods with iron, use of cast iron cookware   Nutritional Diagnosis:  [place]-2.1 Inpaired nutrition utilization As related to history of uterine polyps, IBS.  As evidenced by iron, vitamin B12 and vitamin D deficiencies. [place]-3.3 Overweight/obesity As related to history of excess calories, stress.  As evidenced by patient with current BMI of 39, working on diet and lifestyle changes to promote weight loss.  Intervention:  Instruction and discussion as noted above. Completed InBody body composition measurement; BMR estimate was 1500kcal. Patient plans to adjust intake to meet closer to 1400kcal daily average.  No follow up scheduled at this time; patient will schedule later if needed.  Education Materials given:  Designer, industrial/product with food lists, sample meal pattern Iron Deficiency Anemia Nutrition Therapy (NCM)   Learner/ who was taught:  Patient   Level of understanding: Verbalizes/ demonstrates competency   Demonstrated degree of understanding via:   Teach back Learning barriers: None  Willingness to learn/ readiness for change: Eager, change in progress  Monitoring and Evaluation:  Dietary intake, exercise, vitamin/ mineral status, and body weight      follow  up: prn

## 2021-06-17 ENCOUNTER — Telehealth: Payer: Self-pay | Admitting: Oncology

## 2021-06-17 NOTE — Telephone Encounter (Signed)
Lab appointment is at 11:30. I called patient and left her a VM. I sent her a Mychart message also to see if she would like to change it to 3:30.   Thanks, [employer]

## 2021-06-17 NOTE — Telephone Encounter (Signed)
Pt called to check on her time for her lab appt on 1-27. She was told 3:30 but mychart is showing 11:30. Call pt to confirm at [phone]

## 2021-06-20 ENCOUNTER — Inpatient Hospital Stay: Payer: BC Managed Care – PPO | Attending: Oncology

## 2021-06-20 ENCOUNTER — Other Ambulatory Visit: Payer: Self-pay

## 2021-06-20 DIAGNOSIS — N920 Excessive and frequent menstruation with regular cycle: Secondary | ICD-10-CM | POA: Diagnosis not present

## 2021-06-20 DIAGNOSIS — D509 Iron deficiency anemia, unspecified: Secondary | ICD-10-CM | POA: Insufficient documentation

## 2021-06-20 DIAGNOSIS — G43909 Migraine, unspecified, not intractable, without status migrainosus: Secondary | ICD-10-CM | POA: Diagnosis not present

## 2021-06-20 LAB — CBC WITH DIFFERENTIAL/PLATELET
Abs Immature Granulocytes: 0.03 10*3/uL (ref 0.00–0.07)
Basophils Absolute: 0.1 10*3/uL (ref 0.0–0.1)
Basophils Relative: 1 %
Eosinophils Absolute: 0.1 10*3/uL (ref 0.0–0.5)
Eosinophils Relative: 2 %
HCT: 41.8 % (ref 36.0–46.0)
Hemoglobin: 13 g/dL (ref 12.0–15.0)
Immature Granulocytes: 0 %
Lymphocytes Relative: 23 %
Lymphs Abs: 2 10*3/uL (ref 0.7–4.0)
MCH: 24.6 pg — ABNORMAL LOW (ref 26.0–34.0)
MCHC: 31.1 g/dL (ref 30.0–36.0)
MCV: 79 fL — ABNORMAL LOW (ref 80.0–100.0)
Monocytes Absolute: 0.4 10*3/uL (ref 0.1–1.0)
Monocytes Relative: 5 %
Neutro Abs: 5.9 10*3/uL (ref 1.7–7.7)
Neutrophils Relative %: 69 %
Platelets: 414 10*3/uL — ABNORMAL HIGH (ref 150–400)
RBC: 5.29 MIL/uL — ABNORMAL HIGH (ref 3.87–5.11)
RDW: 25.8 % — ABNORMAL HIGH (ref 11.5–15.5)
WBC: 8.6 10*3/uL (ref 4.0–10.5)
nRBC: 0 % (ref 0.0–0.2)

## 2021-06-20 LAB — IRON AND TIBC
Iron: 35 ug/dL (ref 28–170)
Saturation Ratios: 9 % — ABNORMAL LOW (ref 10.4–31.8)
TIBC: 395 ug/dL (ref 250–450)
UIBC: 360 ug/dL

## 2021-06-20 LAB — FERRITIN: Ferritin: 40 ng/mL (ref 11–307)

## 2021-06-20 LAB — VITAMIN B12: Vitamin B-12: 448 pg/mL (ref 180–914)

## 2021-06-20 LAB — FOLATE: Folate: 27 ng/mL (ref >5.9)

## 2021-06-25 DIAGNOSIS — N200 Calculus of kidney: Secondary | ICD-10-CM | POA: Diagnosis not present

## 2021-06-26 ENCOUNTER — Other Ambulatory Visit: Payer: Self-pay | Admitting: Urology

## 2021-06-30 ENCOUNTER — Telehealth: Payer: BC Managed Care – PPO | Admitting: Oncology

## 2021-07-01 ENCOUNTER — Other Ambulatory Visit: Payer: Self-pay

## 2021-07-01 ENCOUNTER — Encounter: Payer: Self-pay | Admitting: Oncology

## 2021-07-01 ENCOUNTER — Inpatient Hospital Stay: Payer: BC Managed Care – PPO | Attending: Oncology | Admitting: Oncology

## 2021-07-01 DIAGNOSIS — D509 Iron deficiency anemia, unspecified: Secondary | ICD-10-CM | POA: Diagnosis not present

## 2021-07-01 NOTE — Progress Notes (Signed)
I connected with Tina Fleming on 07/01/21 at  1:15 PM EST by video enabled telemedicine visit and verified that I am speaking with the correct person using two identifiers.   I discussed the limitations, risks, security and privacy concerns of performing an evaluation and management service by telemedicine and the availability of in-person appointments. I also discussed with the patient that there may be a patient responsible charge related to this service. The patient expressed understanding and agreed to proceed.  Other persons participating in the visit and their role in the encounter:  none  Patient's location:  home Provider's location:  work  Risk analyst Complaint: Routine follow-up of iron deficiency anemia  History of present illness: Patient is a 41 year old female with history of migraine and irritable bowel syndrome who has been referred for iron deficiency anemia.  Most recent CBC from 03/31/2021 showed white count of 9.8, H&H of 9.8/32.3 with an MCV of 64 and a platelet count of 437.  Iron studies showed a low ferritin of 3.3 with an elevated TIBC of 501 and iron saturation of 3.8%.  Looking back at her prior CBCs patient's hemoglobin was closer to 12 back in 2020 but it has fluctuated between 9-11 over the years with persistent microcytosis.   Patient had hysteroscopy and D&C for DU B back in July 2022.  Since then her menstrual cycles last for about 5 days but the first 3 days are particularly heavy.  She is unable to use any estrogen based hormone therapy due to her history of migraines.  Concern for weight gain with progesterone-based treatments.  Currently reports some baseline fatigue.  Occasionally feels dizzy but has not had any syncopal episodes.  Denies any bleeding in her stool or urine.  Denies any dark melanotic stools.  Denies any consistent use of NSAIDs.  She is unable to tolerate oral iron due to constipation  Interval history reports that her energy levels were better after  receiving IV iron although she still has some mild fatigue.  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   Review of Systems  Constitutional:  Positive for malaise/fatigue. Negative for chills, fever and weight loss.  HENT:  Negative for congestion, ear discharge and nosebleeds.   Eyes:  Negative for blurred vision.  Respiratory:  Negative for cough, hemoptysis, sputum production, shortness of breath and wheezing.   Cardiovascular:  Negative for chest pain, palpitations, orthopnea and claudication.  Gastrointestinal:  Negative for abdominal pain, blood in stool, constipation, diarrhea, heartburn, melena, nausea and vomiting.  Genitourinary:  Negative for dysuria, flank pain, frequency, hematuria and urgency.  Musculoskeletal:  Negative for back pain, joint pain and myalgias.  Skin:  Negative for rash.  Neurological:  Negative for dizziness, tingling, focal weakness, seizures, weakness and headaches.  Endo/Heme/Allergies:  Does not bruise/bleed easily.  Psychiatric/Behavioral:  Negative for depression and suicidal ideas. The patient does not have insomnia.    Allergies  Allergen Reactions   Sulfa Antibiotics Rash    Past Medical History:  Diagnosis Date   Bilateral occipital neuralgia    Complication of anesthesia    hard to wake due to  prolonged sedation   DUB (dysfunctional uterine bleeding)    Heart murmur    per pt asymtomatic, dx age 74 and told had normal echo   History of 2019 novel coronavirus disease (COVID-19) 05/2018   per pt mild symptoms that resolved   History of kidney  stones    History of urethral stricture    age 75 s/p dilatation   IBS (irritable bowel syndrome)    IDA (iron deficiency anemia)    Intractable migraine with aura without status migrainosus    neruologist--- dr h. Manuella Ghazi   Thyroid nodule    per pt had biopsy of nodule approx. 2019 or 2020 , benign , and no follow up needed   Vitamin D  deficiency     Past Surgical History:  Procedure Laterality Date   DILATATION & CURETTAGE/HYSTEROSCOPY WITH MYOSURE N/A 03/20/2016   Procedure: DILATATION & CURETTAGE/HYSTEROSCOPY WITH MYOSURE;  Surgeon: Servando Salina, MD;  Location: [facility];  Service: Gynecology;  Laterality: N/A;  45 min. requested   DILATATION & CURETTAGE/HYSTEROSCOPY WITH MYOSURE N/A 12/06/2020   Procedure: DILATATION & CURETTAGE/DIAGNOSTIC HYSTEROSCOPY/ HYSTEROSCOPIC RESECTION OF ENDOMETRIAL POLYP USING MYOSURE;  Surgeon: Servando Salina, MD;  Location: [facility];  Service: Gynecology;  Laterality: N/A;   EXTRACORPOREAL SHOCK WAVE LITHOTRIPSY  04/18/2013   @[facility]    KNEE ARTHROSCOPY  04/24/2011   Procedure: ARTHROSCOPY KNEE;  Surgeon: Johnn Hai;  Location: [facility];  Service: Orthopedics;  Laterality: Right;  /Arthroscopy with debridement, right knee   LAPAROSCOPIC APPENDECTOMY  03/21/2012   Procedure: APPENDECTOMY LAPAROSCOPIC;  Surgeon: Joyice Faster. Cornett, MD;  Location: [facility];  Service: General;  Laterality: N/A;   URETHRAL STRICTURE DILATATION     age 23    Social History   Socioeconomic History   Marital status: Single    Spouse name: Not on file   Number of children: Not on file   Years of education: Not on file   Highest education level: Not on file  Occupational History   Not on file  Tobacco Use   Smoking status: Never   Smokeless tobacco: Never  Vaping Use   Vaping Use: Never used  Substance and Sexual Activity   Alcohol use: No    Alcohol/week: 0.0 standard drinks   Drug use: No   Sexual activity: Not Currently    Birth control/protection: None  Other Topics Concern   Not on file  Social History Narrative   Not on file   Social Determinants of Health   Financial Resource Strain: Not on file  Food Insecurity: Not on file  Transportation Needs: Not on file  Physical Activity: Not on file  Stress: Not on file  Social Connections: Not on file   Intimate Partner Violence: Not on file    Family History  Problem Relation Age of Onset   Cancer Mother        pancreatic   Arthritis Mother    Hyperlipidemia Mother    Hypertension Mother    Cancer Father        skin   Arthritis Father    Hyperlipidemia Father    Hypertension Father    Diabetes Father    Ankylosing spondylitis Brother    Spondylitis Brother    Breast cancer Paternal Aunt        43-60   Breast cancer Paternal Aunt        50-60's     Current Outpatient Medications:    Bacillus Coagulans-Inulin (PROBIOTIC-PREBIOTIC PO), Take by mouth., Disp: , Rfl:    Cholecalciferol (VITAMIN D PO), Take 1 capsule by mouth every morning., Disp: , Rfl:    Cyanocobalamin (B-12 PO), Take by mouth daily., Disp: , Rfl:    FIBER SELECT GUMMIES PO, Take by mouth. Target brand adult fiber gummies, Disp: ,  Rfl:    ibuprofen (ADVIL) 800 MG tablet, Take 1 tablet (800 mg total) by mouth every 8 (eight) hours as needed for moderate pain., Disp: 30 tablet, Rfl: 5  No results found.  No images are attached to the encounter.   CMP Latest Ref Rng & Units 03/31/2021  Glucose 70 - 99 mg/dL 80  BUN 6 - 23 mg/dL 17  Creatinine 0.40 - 1.20 mg/dL 0.71  Sodium 135 - 145 mEq/L 138  Potassium 3.5 - 5.1 mEq/L 4.2  Chloride 96 - 112 mEq/L 103  CO2 19 - 32 mEq/L 25  Calcium 8.4 - 10.5 mg/dL 9.8  Total Protein 6.0 - 8.3 g/dL 7.2  Total Bilirubin 0.2 - 1.2 mg/dL 0.4  Alkaline Phos 39 - 117 U/L 83  AST 0 - 37 U/L 17  ALT 0 - 35 U/L 13   CBC Latest Ref Rng & Units 06/20/2021  WBC 4.0 - 10.5 K/uL 8.6  Hemoglobin 12.0 - 15.0 g/dL 13.0  Hematocrit 36.0 - 46.0 % 41.8  Platelets 150 - 400 K/uL 414(H)     Observation/objective: Appears in no acute distress over video visit today.  Breathing is nonlabored  Assessment and plan: Patient is a 41 year old female with history of iron deficiency anemia likely secondary to menorrhagia and this is a routine follow-up visit  Patient is not presently  anemicAnd her hemoglobin is improved to 13 from 9.83 months ago after receiving IV iron.  Ferritin levels have normalized to 40 although iron saturation is still 9%.  TIBC has normalized.  Folate and B12 are normal.  I will hold off on any further IV iron at this time.  Repeat CBC ferritin iron studies in 3 and 6 months and I will see her in 6 months  Follow-up instructions: As above  I discussed the assessment and treatment plan with the patient. The patient was provided an opportunity to ask questions and all were answered. The patient agreed with the plan and demonstrated an understanding of the instructions.   The patient was advised to call back or seek an in-person evaluation if the symptoms worsen or if the condition fails to improve as anticipated.    Visit Diagnosis: 1. Iron deficiency anemia, unspecified iron deficiency anemia type     Dr. Randa Evens, MD, MPH [facility] at [facility] Tel- [phone] 07/01/2021 1:46 PM

## 2021-07-03 NOTE — Progress Notes (Signed)
Patient to arrive at 0800 on 07/07/2021. History and medications reviewed. Pre-procedure instructions given. NPO after MN on Sunday except for clear liquids until 0600. Driver secured.

## 2021-07-07 ENCOUNTER — Encounter (HOSPITAL_BASED_OUTPATIENT_CLINIC_OR_DEPARTMENT_OTHER): Payer: Self-pay | Admitting: Urology

## 2021-07-07 ENCOUNTER — Encounter (HOSPITAL_BASED_OUTPATIENT_CLINIC_OR_DEPARTMENT_OTHER)
Admission: RE | Disposition: A | Discharge status: Home / Self Care | Payer: Self-pay | Source: Home / Self Care | Attending: Urology

## 2021-07-07 ENCOUNTER — Ambulatory Visit (HOSPITAL_COMMUNITY): Payer: BC Managed Care – PPO

## 2021-07-07 ENCOUNTER — Ambulatory Visit (HOSPITAL_BASED_OUTPATIENT_CLINIC_OR_DEPARTMENT_OTHER)
Admission: RE | Admit: 2021-07-07 | Discharge: 2021-07-07 | Disposition: A | Discharge status: Home / Self Care | Payer: BC Managed Care – PPO | Attending: Urology | Admitting: Urology

## 2021-07-07 DIAGNOSIS — I878 Other specified disorders of veins: Secondary | ICD-10-CM | POA: Diagnosis not present

## 2021-07-07 DIAGNOSIS — N200 Calculus of kidney: Secondary | ICD-10-CM | POA: Insufficient documentation

## 2021-07-07 DIAGNOSIS — N201 Calculus of ureter: Secondary | ICD-10-CM | POA: Diagnosis not present

## 2021-07-07 HISTORY — PX: EXTRACORPOREAL SHOCK WAVE LITHOTRIPSY: SHX1557

## 2021-07-07 LAB — POCT PREGNANCY, URINE: Preg Test, Ur: NEGATIVE

## 2021-07-07 SURGERY — LITHOTRIPSY, ESWL
Anesthesia: LOCAL | Laterality: Right

## 2021-07-07 MED ORDER — SODIUM CHLORIDE 0.9 % IV SOLN: INTRAVENOUS | Status: DC

## 2021-07-07 MED ORDER — DIAZEPAM 5 MG PO TABS
10.0000 mg | ORAL_TABLET | ORAL | Status: AC
  Administered 2021-07-07: 10 mg via ORAL

## 2021-07-07 MED ORDER — DIAZEPAM 5 MG PO TABS
ORAL_TABLET | ORAL | Status: AC
  Filled 2021-07-07: qty 2

## 2021-07-07 MED ORDER — CIPROFLOXACIN HCL 500 MG PO TABS
500.0000 mg | ORAL_TABLET | ORAL | Status: AC
  Administered 2021-07-07: 500 mg via ORAL

## 2021-07-07 MED ORDER — TAMSULOSIN HCL 0.4 MG PO CAPS: 0.4000 mg | ORAL_CAPSULE | Freq: Every day | ORAL | 1 refills | Status: DC

## 2021-07-07 MED ORDER — ONDANSETRON HCL 4 MG PO TABS: 4.0000 mg | ORAL_TABLET | Freq: Every day | ORAL | 1 refills | Status: DC | PRN

## 2021-07-07 MED ORDER — CIPROFLOXACIN HCL 500 MG PO TABS
ORAL_TABLET | ORAL | Status: AC
  Filled 2021-07-07: qty 1

## 2021-07-07 MED ORDER — HYDROCODONE-ACETAMINOPHEN 5-325 MG PO TABS: 1.0000 | ORAL_TABLET | ORAL | 0 refills | Status: DC | PRN

## 2021-07-07 NOTE — H&P (Signed)
See scanned H&P

## 2021-07-07 NOTE — Op Note (Signed)
See [facility] OP note scanned into chart. Also because of the size, density, location and other factors that cannot be anticipated I feel this will likely be a staged procedure. This fact supersedes any indication in the scanned [facility] operative note to the contrary.  

## 2021-07-08 ENCOUNTER — Encounter (HOSPITAL_BASED_OUTPATIENT_CLINIC_OR_DEPARTMENT_OTHER): Payer: Self-pay | Admitting: Urology

## 2021-07-08 DIAGNOSIS — N200 Calculus of kidney: Secondary | ICD-10-CM | POA: Diagnosis not present

## 2021-07-25 DIAGNOSIS — N200 Calculus of kidney: Secondary | ICD-10-CM | POA: Diagnosis not present

## 2021-08-28 DIAGNOSIS — R42 Dizziness and giddiness: Secondary | ICD-10-CM | POA: Diagnosis not present

## 2021-08-28 DIAGNOSIS — J301 Allergic rhinitis due to pollen: Secondary | ICD-10-CM | POA: Diagnosis not present

## 2021-09-12 ENCOUNTER — Ambulatory Visit (INDEPENDENT_AMBULATORY_CARE_PROVIDER_SITE_OTHER): Payer: BC Managed Care – PPO | Admitting: Internal Medicine

## 2021-09-12 ENCOUNTER — Encounter: Payer: Self-pay | Admitting: Internal Medicine

## 2021-09-12 VITALS — BP 132/88 | HR 69 | Temp 98.0°F | Resp 14 | Ht 65.0 in | Wt 228.0 lb

## 2021-09-12 DIAGNOSIS — D509 Iron deficiency anemia, unspecified: Secondary | ICD-10-CM

## 2021-09-12 DIAGNOSIS — Z0000 Encounter for general adult medical examination without abnormal findings: Secondary | ICD-10-CM | POA: Diagnosis not present

## 2021-09-12 DIAGNOSIS — G43109 Migraine with aura, not intractable, without status migrainosus: Secondary | ICD-10-CM

## 2021-09-12 DIAGNOSIS — N938 Other specified abnormal uterine and vaginal bleeding: Secondary | ICD-10-CM

## 2021-09-12 DIAGNOSIS — Z1231 Encounter for screening mammogram for malignant neoplasm of breast: Secondary | ICD-10-CM | POA: Diagnosis not present

## 2021-09-12 DIAGNOSIS — E669 Obesity, unspecified: Secondary | ICD-10-CM

## 2021-09-12 DIAGNOSIS — R5383 Other fatigue: Secondary | ICD-10-CM | POA: Diagnosis not present

## 2021-09-12 DIAGNOSIS — K581 Irritable bowel syndrome with constipation: Secondary | ICD-10-CM

## 2021-09-12 DIAGNOSIS — E041 Nontoxic single thyroid nodule: Secondary | ICD-10-CM

## 2021-09-12 DIAGNOSIS — N200 Calculus of kidney: Secondary | ICD-10-CM

## 2021-09-12 DIAGNOSIS — E559 Vitamin D deficiency, unspecified: Secondary | ICD-10-CM

## 2021-09-12 DIAGNOSIS — Z13220 Encounter for screening for lipoid disorders: Secondary | ICD-10-CM

## 2021-09-12 DIAGNOSIS — F439 Reaction to severe stress, unspecified: Secondary | ICD-10-CM

## 2021-09-12 NOTE — Assessment & Plan Note (Signed)
Physical today 09/12/21.  PAP - through gyn.  Mammogram ordered.  She will schedule.  ?

## 2021-09-12 NOTE — Progress Notes (Addendum)
Patient ID: Tina Fleming, female   DOB: Nov 26, 1980, 41 y.o.   MRN: [id number] ? ? ?Subjective:  ? ? Patient ID: Tina Fleming, female    DOB: [birth year], 41 y.o.   MRN: [id number] ? ?This visit occurred during the SARS-CoV-2 public health emergency.  Safety protocols were in place, including screening questions prior to the visit, additional usage of staff PPE, and extensive cleaning of exam room while observing appropriate contact time as indicated for disinfecting solutions.  ? ?Patient here for her physical  .  ? ?HPI ?S/p lithotripsy 06/2021.  Follows with Dr Janese Banks - iron deficient anemia.  S/p hysteroscopy and D&C for DUB in 11/2020.  Receiving IV iron. Hgb improved - normal.  Plan for f/u in 6 months. Periods - 4-5 days q 21-28 days.  No chest pain or sob reported.  No abdominal pain or bowel change reported.  Discussed diet and exercise.  She is interested in weight loss clinic.  ? ? ?Past Medical History:  ?Diagnosis Date  ? Bilateral occipital neuralgia   ? Complication of anesthesia   ? hard to wake due to  prolonged sedation  ? DUB (dysfunctional uterine bleeding)   ? Heart murmur   ? per pt asymtomatic, dx age 52 and told had normal echo  ? History of 2019 novel coronavirus disease (COVID-19) 05/2018  ? per pt mild symptoms that resolved  ? History of kidney stones   ? History of urethral stricture   ? age 13 s/p dilatation  ? IBS (irritable bowel syndrome)   ? IDA (iron deficiency anemia)   ? Intractable migraine with aura without status migrainosus   ? neruologist--- dr h. shah  ? Thyroid nodule   ? per pt had biopsy of nodule approx. 2019 or 2020 , benign , and no follow up needed  ? Vitamin D deficiency   ? ?Past Surgical History:  ?Procedure Laterality Date  ? DILATATION & CURETTAGE/HYSTEROSCOPY WITH MYOSURE N/A 03/20/2016  ? Procedure: [place];  Surgeon: Servando Salina, MD;  Location: [facility];  Service: Gynecology;  Laterality: N/A;  45 min. requested  ?  DILATATION & CURETTAGE/HYSTEROSCOPY WITH MYOSURE N/A 12/06/2020  ? Procedure: DILATATION & CURETTAGE/DIAGNOSTIC HYSTEROSCOPY/ HYSTEROSCOPIC RESECTION OF ENDOMETRIAL POLYP USING MYOSURE;  Surgeon: Servando Salina, MD;  Location: [facility];  Service: Gynecology;  Laterality: N/A;  ? EXTRACORPOREAL SHOCK WAVE LITHOTRIPSY  04/18/2013  ? @[facility]   ? EXTRACORPOREAL SHOCK WAVE LITHOTRIPSY Right 07/07/2021  ? Procedure: RIGHT EXTRACORPOREAL SHOCK WAVE LITHOTRIPSY (ESWL);  Surgeon: Lucas Mallow, MD;  Location: [facility];  Service: Urology;  Laterality: Right;  ? KNEE ARTHROSCOPY  04/24/2011  ? Procedure: ARTHROSCOPY KNEE;  Surgeon: Johnn Hai;  Location: [facility];  Service: Orthopedics;  Laterality: Right;  /Arthroscopy with debridement, right knee  ? LAPAROSCOPIC APPENDECTOMY  03/21/2012  ? Procedure: APPENDECTOMY LAPAROSCOPIC;  Surgeon: Joyice Faster. Cornett, MD;  Location: [facility];  Service: General;  Laterality: N/A;  ? URETHRAL STRICTURE DILATATION    ? age 60  ? ?Family History  ?Problem Relation Age of Onset  ? Cancer Mother   ?     pancreatic  ? Arthritis Mother   ? Hyperlipidemia Mother   ? Hypertension Mother   ? Cancer Father   ?     skin  ? Arthritis Father   ? Hyperlipidemia Father   ? Hypertension Father   ? Diabetes Father   ? Ankylosing spondylitis Brother   ?  Spondylitis Brother   ? Breast cancer Paternal Aunt   ?     50-60  ? Breast cancer Paternal Aunt   ?     50-60's  ? ?Social History  ? ?Socioeconomic History  ? Marital status: Single  ?  Spouse name: Not on file  ? Number of children: Not on file  ? Years of education: Not on file  ? Highest education level: Not on file  ?Occupational History  ? Not on file  ?Tobacco Use  ? Smoking status: Never  ? Smokeless tobacco: Never  ?Vaping Use  ? Vaping Use: Never used  ?Substance and Sexual Activity  ? Alcohol use: No  ?  Alcohol/week: 0.0 standard drinks  ? Drug use: No  ? Sexual activity: Not Currently  ?   Birth control/protection: None  ?Other Topics Concern  ? Not on file  ?Social History Narrative  ? Not on file  ? ?Social Determinants of Health  ? ?Financial Resource Strain: Not on file  ?Food Insecurity: Not on file  ?Transportation Needs: Not on file  ?Physical Activity: Not on file  ?Stress: Not on file  ?Social Connections: Not on file  ? ? ? ?Review of Systems  ?Constitutional:  Negative for appetite change and unexpected weight change.  ?HENT:  Negative for congestion, sinus pressure and sore throat.   ?Eyes:  Negative for pain and visual disturbance.  ?Respiratory:  Negative for cough, chest tightness and shortness of breath.   ?Cardiovascular:  Negative for chest pain, palpitations and leg swelling.  ?Gastrointestinal:  Negative for abdominal pain, diarrhea and vomiting.  ?Genitourinary:  Negative for difficulty urinating and dysuria.  ?Musculoskeletal:  Negative for joint swelling and myalgias.  ?Skin:  Negative for color change and rash.  ?Neurological:  Negative for dizziness and headaches.  ?Hematological:  Negative for adenopathy. Does not bruise/bleed easily.  ?Psychiatric/Behavioral:  Negative for agitation and dysphoric mood.   ? ?   ?Objective:  ?  ? ?BP 132/88 (BP Location: Left Arm, Patient Position: Sitting, Cuff Size: Large)   Pulse 69   Temp 98 ?F (36.7 ?C) (Temporal)   Resp 14   Ht 5\' 5"  (1.651 m)   Wt 228 lb (103.4 kg)   SpO2 98%   BMI 37.94 kg/m?  ?Wt Readings from Last 3 Encounters:  ?09/12/21 228 lb (103.4 kg)  ?07/07/21 230 lb 14.4 oz (104.7 kg)  ?06/16/21 236 lb 8 oz (107.3 kg)  ? ? ?Physical Exam ?Vitals reviewed.  ?Constitutional:   ?   General: She is not in acute distress. ?   Appearance: Normal appearance. She is well-developed.  ?HENT:  ?   Head: Normocephalic and atraumatic.  ?   Right Ear: External ear normal.  ?   Left Ear: External ear normal.  ?Eyes:  ?   General: No scleral icterus.    ?   Right eye: No discharge.     ?   Left eye: No discharge.  ?    Conjunctiva/sclera: Conjunctivae normal.  ?Neck:  ?   Thyroid: No thyromegaly.  ?Cardiovascular:  ?   Rate and Rhythm: Normal rate and regular rhythm.  ?Pulmonary:  ?   Effort: No tachypnea, accessory muscle usage or respiratory distress.  ?   Breath sounds: Normal breath sounds. No decreased breath sounds or wheezing.  ?Chest:  ?Breasts: ?   Right: No inverted nipple, mass, nipple discharge or tenderness (no axillary adenopathy).  ?   Left: No inverted nipple, mass, nipple discharge  or tenderness (no axilarry adenopathy).  ?Abdominal:  ?   General: Bowel sounds are normal.  ?   Palpations: Abdomen is soft.  ?   Tenderness: There is no abdominal tenderness.  ?Musculoskeletal:     ?   General: No swelling or tenderness.  ?   Cervical back: Neck supple.  ?Lymphadenopathy:  ?   Cervical: No cervical adenopathy.  ?Skin: ?   Findings: No erythema or rash.  ?Neurological:  ?   Mental Status: She is alert and oriented to person, place, and time.  ?Psychiatric:     ?   Mood and Affect: Mood normal.     ?   Behavior: Behavior normal.  ? ? ? ?Outpatient Encounter Medications as of 09/12/2021  ?Medication Sig  ? Bacillus Coagulans-Inulin (PROBIOTIC-PREBIOTIC PO) Take by mouth.  ? Cholecalciferol (VITAMIN D PO) Take 1 capsule by mouth every morning.  ? Cyanocobalamin (B-12 PO) Take by mouth daily.  ? FIBER SELECT GUMMIES PO Take by mouth. Target brand adult fiber gummies  ? [DISCONTINUED] HYDROcodone-acetaminophen (NORCO/VICODIN) 5-325 MG tablet Take 1 tablet by mouth every 4 (four) hours as needed for moderate pain. (Patient not taking: Reported on 09/12/2021)  ? [DISCONTINUED] ibuprofen (ADVIL) 800 MG tablet Take 1 tablet (800 mg total) by mouth every 8 (eight) hours as needed for moderate pain. (Patient not taking: Reported on 09/12/2021)  ? [DISCONTINUED] ondansetron (ZOFRAN) 4 MG tablet Take 1 tablet (4 mg total) by mouth daily as needed for nausea or vomiting. (Patient not taking: Reported on 09/12/2021)  ? [DISCONTINUED]  tamsulosin (FLOMAX) 0.4 MG CAPS capsule Take 1 capsule (0.4 mg total) by mouth daily. (Patient not taking: Reported on 09/12/2021)  ? ?No facility-administered encounter medications on file as of 09/12/2021.  ?  ? ?Lab Results

## 2021-09-21 ENCOUNTER — Encounter: Payer: Self-pay | Admitting: Internal Medicine

## 2021-09-21 DIAGNOSIS — E669 Obesity, unspecified: Secondary | ICD-10-CM | POA: Insufficient documentation

## 2021-09-21 NOTE — Addendum Note (Signed)
Addended by: Alisa Graff on: 09/21/2021 09:17 AM ? ? Modules accepted: Orders ? ?

## 2021-09-21 NOTE — Assessment & Plan Note (Signed)
Check vitamin D level with next labs.  ?

## 2021-09-21 NOTE — Assessment & Plan Note (Signed)
IBS-C. Taking fiber gummies.  Follow.    ?

## 2021-09-21 NOTE — Assessment & Plan Note (Signed)
Discussed diet and exercise.  Request referral to weight loss clinic ?

## 2021-09-21 NOTE — Assessment & Plan Note (Signed)
S/p D&C.  Periods as outlined.  Continue f/u with gyn. Per note, last pap 11/2020.  Need copy of report.  ?

## 2021-09-21 NOTE — Assessment & Plan Note (Addendum)
Appears to be stable.  Has been evaluated by neurology.  ?

## 2021-09-21 NOTE — Assessment & Plan Note (Signed)
Urology 07/25/21 - Tina Fleming - s/p right side ESWL. F/u K[facility] and urology one year.  

## 2021-09-21 NOTE — Assessment & Plan Note (Signed)
Seeing Dr Smith Robert.  Constipation with oral iron.  Receiving iron infusions. hgb last check wnl.  Continue f/u with hematology.  ?

## 2021-09-21 NOTE — Assessment & Plan Note (Signed)
Saw Dr Solum.  S/p biopsy.  Pt reports no change and that no further w/up warranted.  Last evaluated 10/12/18 by endocrinology.  Follow thyroid function.     

## 2021-09-21 NOTE — Assessment & Plan Note (Signed)
S/p D&C.  Periods as outlined.  Seeing Dr Smith Robert.  Has received IV iron.  Last hgb wnl.  Continue f/y with hematology.  ?

## 2021-09-21 NOTE — Assessment & Plan Note (Signed)
Increased stress.  Overall appears to be handling things relatively well.  Follow.   

## 2021-09-24 ENCOUNTER — Encounter: Payer: Self-pay | Admitting: Internal Medicine

## 2021-09-25 NOTE — Telephone Encounter (Signed)
Yes. Please call hematology and see if they can draw her labs that we have ordered (when they obtain the labs they need).  Let me/her know if a problem.  Also, let her know that I am going to have Dr Smith Robert review the xray as well - to determine if any further w/up evaluation warranted.   ?

## 2021-09-29 ENCOUNTER — Inpatient Hospital Stay: Payer: BC Managed Care – PPO | Attending: Nurse Practitioner

## 2021-09-29 ENCOUNTER — Other Ambulatory Visit: Payer: Self-pay

## 2021-09-29 DIAGNOSIS — D509 Iron deficiency anemia, unspecified: Secondary | ICD-10-CM | POA: Diagnosis not present

## 2021-09-29 DIAGNOSIS — Z79899 Other long term (current) drug therapy: Secondary | ICD-10-CM | POA: Insufficient documentation

## 2021-09-29 LAB — CBC WITH DIFFERENTIAL/PLATELET
Abs Immature Granulocytes: 0.01 10*3/uL (ref 0.00–0.07)
Basophils Absolute: 0 10*3/uL (ref 0.0–0.1)
Basophils Relative: 1 %
Eosinophils Absolute: 0.1 10*3/uL (ref 0.0–0.5)
Eosinophils Relative: 2 %
HCT: 42.5 % (ref 36.0–46.0)
Hemoglobin: 13.6 g/dL (ref 12.0–15.0)
Immature Granulocytes: 0 %
Lymphocytes Relative: 23 %
Lymphs Abs: 1.9 10*3/uL (ref 0.7–4.0)
MCH: 26.9 pg (ref 26.0–34.0)
MCHC: 32 g/dL (ref 30.0–36.0)
MCV: 84 fL (ref 80.0–100.0)
Monocytes Absolute: 0.4 10*3/uL (ref 0.1–1.0)
Monocytes Relative: 5 %
Neutro Abs: 5.7 10*3/uL (ref 1.7–7.7)
Neutrophils Relative %: 69 %
Platelets: 404 10*3/uL — ABNORMAL HIGH (ref 150–400)
RBC: 5.06 MIL/uL (ref 3.87–5.11)
RDW: 15.7 % — ABNORMAL HIGH (ref 11.5–15.5)
WBC: 8.1 10*3/uL (ref 4.0–10.5)
nRBC: 0 % (ref 0.0–0.2)

## 2021-09-29 LAB — LIPID PANEL
Cholesterol: 200 mg/dL (ref 0–200)
HDL: 53 mg/dL (ref >40)
LDL Cholesterol: 127 mg/dL — ABNORMAL HIGH (ref 0–99)
Total CHOL/HDL Ratio: 3.8 RATIO
Triglycerides: 102 mg/dL (ref <150)
VLDL: 20 mg/dL (ref 0–40)

## 2021-09-29 LAB — COMPREHENSIVE METABOLIC PANEL
ALT: 20 U/L (ref 0–44)
AST: 23 U/L (ref 15–41)
Albumin: 4.5 g/dL (ref 3.5–5.0)
Alkaline Phosphatase: 73 U/L (ref 38–126)
Anion gap: 6 (ref 5–15)
BUN: 12 mg/dL (ref 6–20)
CO2: 26 mmol/L (ref 22–32)
Calcium: 9.6 mg/dL (ref 8.9–10.3)
Chloride: 103 mmol/L (ref 98–111)
Creatinine, Ser: 0.82 mg/dL (ref 0.44–1.00)
GFR, Estimated: >60 mL/min (ref >60)
Glucose, Bld: 97 mg/dL (ref 70–99)
Potassium: 4.1 mmol/L (ref 3.5–5.1)
Sodium: 135 mmol/L (ref 135–145)
Total Bilirubin: 0.9 mg/dL (ref 0.3–1.2)
Total Protein: 8.1 g/dL (ref 6.5–8.1)

## 2021-09-29 LAB — IRON AND TIBC
Iron: 29 ug/dL (ref 28–170)
Saturation Ratios: 7 % — ABNORMAL LOW (ref 10.4–31.8)
TIBC: 430 ug/dL (ref 250–450)
UIBC: 401 ug/dL

## 2021-09-29 LAB — FERRITIN: Ferritin: 10 ng/mL — ABNORMAL LOW (ref 11–307)

## 2021-09-29 LAB — VITAMIN D 25 HYDROXY (VIT D DEFICIENCY, FRACTURES): Vit D, 25-Hydroxy: 33.13 ng/mL (ref 30–100)

## 2021-09-29 LAB — TSH: TSH: 1.256 u[IU]/mL (ref 0.350–4.500)

## 2021-09-29 NOTE — Telephone Encounter (Signed)
S/w pt - advised of below. ?Labs added to hematology w/u ?

## 2021-09-30 ENCOUNTER — Other Ambulatory Visit: Payer: Self-pay

## 2021-09-30 ENCOUNTER — Telehealth: Payer: Self-pay

## 2021-09-30 DIAGNOSIS — R109 Unspecified abdominal pain: Secondary | ICD-10-CM

## 2021-09-30 DIAGNOSIS — D509 Iron deficiency anemia, unspecified: Secondary | ICD-10-CM

## 2021-09-30 NOTE — Telephone Encounter (Signed)
Contacted pt to make aware of iron deficient results per Dr. Janese Banks if pt is symptomatic she can have IV iron. Pt declined iron stating she will hold off. Pt was more concerned about abnormal finding on a x-ray done on 07/07/21 let pt know that a bone scan was ordered per Dr. Janese Banks and our scheduling team will reach out with appointment details Pt understands.  ?

## 2021-09-30 NOTE — Progress Notes (Signed)
Confirmed X-ray with Dr. Lorin Picket; completed on 07/07/21 and pt is questioning  abdominal xray finding (sclerotic focus left side of sacrum).  ?

## 2021-09-30 NOTE — Progress Notes (Signed)
Pt will like to hold off on IV iron for now. More concerned about x-ray and informed pt that a bone scan was ordered and scheduling will reach out with appointment details.  ?

## 2021-10-13 ENCOUNTER — Other Ambulatory Visit: Payer: BC Managed Care – PPO

## 2021-10-14 ENCOUNTER — Encounter
Admission: RE | Admit: 2021-10-14 | Discharge: 2021-10-14 | Disposition: A | Discharge status: Home / Self Care | Payer: BC Managed Care – PPO | Source: Ambulatory Visit | Attending: Oncology | Admitting: Oncology

## 2021-10-14 DIAGNOSIS — D509 Iron deficiency anemia, unspecified: Secondary | ICD-10-CM | POA: Diagnosis not present

## 2021-10-14 DIAGNOSIS — R948 Abnormal results of function studies of other organs and systems: Secondary | ICD-10-CM | POA: Diagnosis not present

## 2021-10-14 MED ORDER — TECHNETIUM TC 99M MEDRONATE IV KIT
20.0000 | PACK | Freq: Once | INTRAVENOUS | Status: AC | PRN
  Administered 2021-10-14: 21.42 via INTRAVENOUS

## 2021-10-16 ENCOUNTER — Telehealth: Payer: Self-pay | Admitting: *Deleted

## 2021-10-16 NOTE — Telephone Encounter (Addendum)
-----   Message from Sindy Guadeloupe, MD sent at 10/16/2021 12:27 PM EDT ----- Please let patient know that there was no abnormality over sacrum where there was concern on xray. No f/u needed for this. I wouldn't worry about activity over the ribs. I gave her the info and she was happy to get results.

## 2021-10-16 NOTE — Progress Notes (Signed)
It is done   10/16/2021

## 2021-10-28 DIAGNOSIS — R42 Dizziness and giddiness: Secondary | ICD-10-CM | POA: Diagnosis not present

## 2021-12-17 ENCOUNTER — Ambulatory Visit
Admission: RE | Admit: 2021-12-17 | Discharge: 2021-12-17 | Disposition: A | Discharge status: Home / Self Care | Payer: BC Managed Care – PPO | Source: Ambulatory Visit | Attending: Internal Medicine | Admitting: Internal Medicine

## 2021-12-17 DIAGNOSIS — Z1231 Encounter for screening mammogram for malignant neoplasm of breast: Secondary | ICD-10-CM | POA: Diagnosis not present

## 2021-12-18 DIAGNOSIS — Z131 Encounter for screening for diabetes mellitus: Secondary | ICD-10-CM | POA: Diagnosis not present

## 2021-12-18 DIAGNOSIS — K581 Irritable bowel syndrome with constipation: Secondary | ICD-10-CM | POA: Diagnosis not present

## 2021-12-18 DIAGNOSIS — D509 Iron deficiency anemia, unspecified: Secondary | ICD-10-CM | POA: Diagnosis not present

## 2021-12-18 DIAGNOSIS — Z1389 Encounter for screening for other disorder: Secondary | ICD-10-CM | POA: Diagnosis not present

## 2021-12-18 DIAGNOSIS — E8889 Other specified metabolic disorders: Secondary | ICD-10-CM | POA: Diagnosis not present

## 2021-12-29 ENCOUNTER — Inpatient Hospital Stay: Payer: BC Managed Care – PPO | Attending: Nurse Practitioner

## 2021-12-29 DIAGNOSIS — D509 Iron deficiency anemia, unspecified: Secondary | ICD-10-CM | POA: Insufficient documentation

## 2021-12-29 LAB — FERRITIN: Ferritin: 8 ng/mL — ABNORMAL LOW (ref 11–307)

## 2021-12-29 LAB — CBC WITH DIFFERENTIAL/PLATELET
Abs Immature Granulocytes: 0.04 10*3/uL (ref 0.00–0.07)
Basophils Absolute: 0 10*3/uL (ref 0.0–0.1)
Basophils Relative: 1 %
Eosinophils Absolute: 0.1 10*3/uL (ref 0.0–0.5)
Eosinophils Relative: 1 %
HCT: 43.4 % (ref 36.0–46.0)
Hemoglobin: 13.8 g/dL (ref 12.0–15.0)
Immature Granulocytes: 1 %
Lymphocytes Relative: 18 %
Lymphs Abs: 1.5 10*3/uL (ref 0.7–4.0)
MCH: 26.6 pg (ref 26.0–34.0)
MCHC: 31.8 g/dL (ref 30.0–36.0)
MCV: 83.6 fL (ref 80.0–100.0)
Monocytes Absolute: 0.5 10*3/uL (ref 0.1–1.0)
Monocytes Relative: 6 %
Neutro Abs: 6.4 10*3/uL (ref 1.7–7.7)
Neutrophils Relative %: 73 %
Platelets: 384 10*3/uL (ref 150–400)
RBC: 5.19 MIL/uL — ABNORMAL HIGH (ref 3.87–5.11)
RDW: 15.1 % (ref 11.5–15.5)
WBC: 8.7 10*3/uL (ref 4.0–10.5)
nRBC: 0 % (ref 0.0–0.2)

## 2021-12-29 LAB — IRON AND TIBC
Iron: 33 ug/dL (ref 28–170)
Saturation Ratios: 8 % — ABNORMAL LOW (ref 10.4–31.8)
TIBC: 426 ug/dL (ref 250–450)
UIBC: 393 ug/dL

## 2021-12-30 ENCOUNTER — Other Ambulatory Visit: Payer: BC Managed Care – PPO

## 2022-01-02 ENCOUNTER — Ambulatory Visit: Payer: BC Managed Care – PPO

## 2022-01-02 ENCOUNTER — Inpatient Hospital Stay (HOSPITAL_BASED_OUTPATIENT_CLINIC_OR_DEPARTMENT_OTHER): Payer: BC Managed Care – PPO | Admitting: Nurse Practitioner

## 2022-01-02 DIAGNOSIS — E538 Deficiency of other specified B group vitamins: Secondary | ICD-10-CM

## 2022-01-02 DIAGNOSIS — Z809 Family history of malignant neoplasm, unspecified: Secondary | ICD-10-CM | POA: Diagnosis not present

## 2022-01-02 DIAGNOSIS — D509 Iron deficiency anemia, unspecified: Secondary | ICD-10-CM | POA: Diagnosis not present

## 2022-01-02 NOTE — Progress Notes (Signed)
Hematology Progress Note Virtual Visit Progress Note  I connected with Tina Fleming on 01/02/22 at  2:00 PM EDT by video enabled telemedicine visit and verified that I am speaking with the correct person using two identifiers.   I discussed the limitations, risks, security and privacy concerns of performing an evaluation and management service by telemedicine and the availability of in-person appointments. I also discussed with the patient that there may be a patient responsible charge related to this service. The patient expressed understanding and agreed to proceed.  Other persons participating in the visit and their role in the encounter:  none  Patient's location:  home Provider's location:  work  Stage manager Complaint: Routine follow-up of iron deficiency anemia  History of present illness: Patient is a 41 year old female with history of migraine and irritable bowel syndrome who has been referred for iron deficiency anemia.  Most recent CBC from 03/31/2021 showed white count of 9.8, H&H of 9.8/32.3 with an MCV of 64 and a platelet count of 437.  Iron studies showed a low ferritin of 3.3 with an elevated TIBC of 501 and iron saturation of 3.8%.  Looking back at her prior CBCs patient's hemoglobin was closer to 12 back in 2020 but it has fluctuated between 9-11 over the years with persistent microcytosis.   Patient had hysteroscopy and D&C for DUB back in July 2022.  Since then her menstrual cycles last for about 5 days but the first 3 days are particularly heavy.  She is unable to use any estrogen based hormone therapy due to her history of migraines.  Concern for weight gain with progesterone-based treatments.  Currently reports some baseline fatigue.  Occasionally feels dizzy but has not had any syncopal episodes.  Denies any bleeding in her stool or urine.  Denies any dark melanotic stools.  Denies any consistent use of NSAIDs.  She is unable to tolerate oral iron due to constipation.   Interval  history: Patient agrees to evaluation via telemedicine for follow up of iron deficiency anemia. She feels at baseline. Has some dizziness and fatigue. Is worried about her personal risk of developing cancer due to extensive family history of cancer. Family hx of pancreatic, breast, skin, and lung cancer.    Review of Systems  Constitutional:  Positive for malaise/fatigue. Negative for chills, fever and weight loss.  HENT:  Negative for congestion, ear discharge and nosebleeds.   Eyes:  Negative for blurred vision.  Respiratory:  Negative for cough, hemoptysis, sputum production, shortness of breath and wheezing.   Cardiovascular:  Negative for chest pain, palpitations, orthopnea and claudication.  Gastrointestinal:  Negative for abdominal pain, blood in stool, constipation, diarrhea, heartburn, melena, nausea and vomiting.  Genitourinary:  Negative for dysuria, flank pain, frequency, hematuria and urgency.  Musculoskeletal:  Negative for back pain, joint pain and myalgias.  Skin:  Negative for rash.  Neurological:  Negative for dizziness, tingling, focal weakness, seizures, weakness and headaches.  Endo/Heme/Allergies:  Does not bruise/bleed easily.  Psychiatric/Behavioral:  Negative for depression and suicidal ideas. The patient does not have insomnia.     Allergies  Allergen Reactions   Diphenhydramine Other (See Comments)    Hyperactive, wakefulness   Sulfa Antibiotics Rash    Past Medical History:  Diagnosis Date   Bilateral occipital neuralgia    Complication of anesthesia    hard to wake due to  prolonged sedation   DUB (dysfunctional uterine bleeding)    Heart murmur    per pt asymtomatic, dx age 46  and told had normal echo   History of 2019 novel coronavirus disease (COVID-19) 05/2018   per pt mild symptoms that resolved   History of kidney stones    History of urethral stricture    age 23 s/p dilatation   IBS (irritable bowel syndrome)    IDA (iron deficiency anemia)     Intractable migraine with aura without status migrainosus    neruologist--- dr h. Manuella Ghazi   Thyroid nodule    per pt had biopsy of nodule approx. 2019 or 2020 , benign , and no follow up needed   Vitamin D deficiency     Past Surgical History:  Procedure Laterality Date   DILATATION & CURETTAGE/HYSTEROSCOPY WITH MYOSURE N/A 03/20/2016   Procedure: DILATATION & CURETTAGE/HYSTEROSCOPY WITH MYOSURE;  Surgeon: Servando Salina, MD;  Location: [facility];  Service: Gynecology;  Laterality: N/A;  45 min. requested   DILATATION & CURETTAGE/HYSTEROSCOPY WITH MYOSURE N/A 12/06/2020   Procedure: DILATATION & CURETTAGE/DIAGNOSTIC HYSTEROSCOPY/ HYSTEROSCOPIC RESECTION OF ENDOMETRIAL POLYP USING MYOSURE;  Surgeon: Servando Salina, MD;  Location: [facility];  Service: Gynecology;  Laterality: N/A;   EXTRACORPOREAL SHOCK WAVE LITHOTRIPSY  04/18/2013   @[facility]    EXTRACORPOREAL SHOCK WAVE LITHOTRIPSY Right 07/07/2021   Procedure: RIGHT EXTRACORPOREAL SHOCK WAVE LITHOTRIPSY (ESWL);  Surgeon: Lucas Mallow, MD;  Location[facility];  Service: Urology;  Laterality: Right;   KNEE ARTHROSCOPY  04/24/2011   Procedure: ARTHROSCOPY KNEE;  Surgeon: Johnn Hai;  Location: [facility];  Service: Orthopedics;  Laterality: Right;  /Arthroscopy with debridement, right knee   LAPAROSCOPIC APPENDECTOMY  03/21/2012   Procedure: APPENDECTOMY LAPAROSCOPIC;  Surgeon: Joyice Faster. Cornett, MD;  Location: [facility];  Service: General;  Laterality: N/A;   URETHRAL STRICTURE DILATATION     age 35    Social History   Socioeconomic History   Marital status: Single    Spouse name: Not on file   Number of children: Not on file   Years of education: Not on file   Highest education level: Not on file  Occupational History   Not on file  Tobacco Use   Smoking status: Never   Smokeless tobacco: Never  Vaping Use   Vaping Use: Never used  Substance and Sexual Activity   Alcohol  use: No    Alcohol/week: 0.0 standard drinks of alcohol   Drug use: No   Sexual activity: Not Currently    Birth control/protection: None  Other Topics Concern   Not on file  Social History Narrative   Not on file   Social Determinants of Health   Financial Resource Strain: Not on file  Food Insecurity: Not on file  Transportation Needs: Not on file  Physical Activity: Not on file  Stress: Not on file  Social Connections: Not on file  Intimate Partner Violence: Not on file    Family History  Problem Relation Age of Onset   Cancer Mother        pancreatic   Arthritis Mother    Hyperlipidemia Mother    Hypertension Mother    Cancer Father        skin   Arthritis Father    Hyperlipidemia Father    Hypertension Father    Diabetes Father    Ankylosing spondylitis Brother    Spondylitis Brother    Breast cancer Paternal Aunt        63-60   Breast cancer Paternal Aunt        58-60's  Current Outpatient Medications:    Bacillus Coagulans-Inulin (PROBIOTIC-PREBIOTIC PO), Take by mouth., Disp: , Rfl:    Cholecalciferol (VITAMIN D PO), Take 1 capsule by mouth every morning., Disp: , Rfl:    Cyanocobalamin (B-12 PO), Take by mouth daily., Disp: , Rfl:    FIBER SELECT GUMMIES PO, Take by mouth. [employer] brand adult fiber gummies, Disp: , Rfl:   MM 3D SCREEN BREAST BILATERAL  Result Date: 12/18/2021 CLINICAL DATA:  Screening. EXAM: DIGITAL SCREENING BILATERAL MAMMOGRAM WITH TOMOSYNTHESIS AND CAD TECHNIQUE: Bilateral screening digital craniocaudal and mediolateral oblique mammograms were obtained. Bilateral screening digital breast tomosynthesis was performed. The images were evaluated with computer-aided detection. COMPARISON:  Previous exam(s). ACR Breast Density Category b: There are scattered areas of fibroglandular density. FINDINGS: There are no findings suspicious for malignancy. IMPRESSION: No mammographic evidence of malignancy. A result letter of this screening  mammogram will be mailed directly to the patient. RECOMMENDATION: Screening mammogram in one year. (Code:SM-B-01Y) BI-RADS CATEGORY  1: Negative. Electronically Signed   By: Edwin Cap M.D.   On: 12/18/2021 09:49    Observation/objective:   Appears in no acute distress over video visit today.  Breathing is nonlabored     Latest Ref Rng & Units 09/29/2021    4:00 PM  CMP  Glucose 70 - 99 mg/dL 97   BUN 6 - 20 mg/dL 12   Creatinine 2.95 - 1.00 mg/dL 2.84   Sodium 132 - 440 mmol/L 135   Potassium 3.5 - 5.1 mmol/L 4.1   Chloride 98 - 111 mmol/L 103   CO2 22 - 32 mmol/L 26   Calcium 8.9 - 10.3 mg/dL 9.6   Total Protein 6.5 - 8.1 g/dL 8.1   Total Bilirubin 0.3 - 1.2 mg/dL 0.9   Alkaline Phos 38 - 126 U/L 73   AST 15 - 41 U/L 23   ALT 0 - 44 U/L 20       Latest Ref Rng & Units 12/29/2021    2:39 PM  CBC  WBC 4.0 - 10.5 K/uL 8.7   Hemoglobin 12.0 - 15.0 g/dL 10.2   Hematocrit 72.5 - 46.0 % 43.4   Platelets 150 - 400 K/uL 384    Iron/TIBC/Ferritin/ %Sat    Component Value Date/Time   IRON 33 12/29/2021 1439   TIBC 426 12/29/2021 1439   FERRITIN 8 (L) 12/29/2021 1439   IRONPCTSAT 8 (L) 12/29/2021 1439   IRONPCTSAT 5 (L) 07/21/2010 1436     Assessment and plan: Patient is a 41 year old female with h  Iron Deficiency Anemia- likely secondary to menorrhagia. Previously tried ferrous sulfate but has persistent iron deficiency. She tried IV iron/venofer but experienced stomach pain and worsening fatigue after infusions. Her preference is to avoid infusions if possible. We discussed trialing a different formulation of oral iron to assess tolerance and response. I provided her with samples of Hemax which contained 150 mg of carbonyl iron. She will take 1 pill daily and assess for tolerance. If tolerated, continue.  B12 deficiency- was 448 in January 2023. Check annually.  Family hx of cancer- will refer to genetic counseling for evaluation and possible genetic testing.   Follow-up  instructions:  3 mo- lab (cbc, cmp, ferritin, iron studies) Day to week later see Dr. Smith Robert virtually for follow up- la  I discussed the assessment and treatment plan with the patient. The patient was provided an opportunity to ask questions and all were answered. The patient agreed with the plan and demonstrated an understanding of  the instructions.   The patient was advised to call back or seek an in-person evaluation if the symptoms worsen or if the condition fails to improve as anticipated.   I spent 25 minutes face-to-face video visit time dedicated to the care of this patient on the date of this encounter to include pre-visit review of labs, previous notes, face-to-face time with the patient, and post visit ordering of testing/documentation.   Visit Diagnosis: 1. Iron deficiency anemia, unspecified iron deficiency anemia type   2. Family history of cancer    Beckey Rutter, [place], AGNP-C [place] at [facility] [phone] (clinic) 01/02/2022

## 2022-01-12 DIAGNOSIS — K581 Irritable bowel syndrome with constipation: Secondary | ICD-10-CM | POA: Diagnosis not present

## 2022-01-12 DIAGNOSIS — D509 Iron deficiency anemia, unspecified: Secondary | ICD-10-CM | POA: Diagnosis not present

## 2022-01-16 ENCOUNTER — Encounter: Payer: Self-pay | Admitting: Nurse Practitioner

## 2022-01-21 ENCOUNTER — Encounter: Payer: BC Managed Care – PPO | Admitting: Licensed Clinical Social Worker

## 2022-01-21 ENCOUNTER — Other Ambulatory Visit: Payer: BC Managed Care – PPO

## 2022-01-27 ENCOUNTER — Encounter: Payer: Self-pay | Admitting: Internal Medicine

## 2022-01-28 ENCOUNTER — Inpatient Hospital Stay: Payer: BC Managed Care – PPO

## 2022-01-28 ENCOUNTER — Encounter: Payer: Self-pay | Admitting: Licensed Clinical Social Worker

## 2022-01-28 ENCOUNTER — Inpatient Hospital Stay: Payer: BC Managed Care – PPO | Attending: Nurse Practitioner | Admitting: Licensed Clinical Social Worker

## 2022-01-28 DIAGNOSIS — Z801 Family history of malignant neoplasm of trachea, bronchus and lung: Secondary | ICD-10-CM

## 2022-01-28 DIAGNOSIS — Z800 Family history of malignant neoplasm of digestive organs: Secondary | ICD-10-CM

## 2022-01-28 DIAGNOSIS — Z803 Family history of malignant neoplasm of breast: Secondary | ICD-10-CM | POA: Diagnosis not present

## 2022-01-28 NOTE — Progress Notes (Signed)
REFERRING PROVIDER: Verlon Au, NP [place],  [place] [address]  PRIMARY PROVIDER:  Einar Pheasant, MD  PRIMARY REASON FOR VISIT:  1. Family history of pancreatic cancer   2. Family history of breast cancer   3. Family history of lung cancer      HISTORY OF PRESENT ILLNESS:   Tina Fleming, a 41 y.o. female, was seen for a  cancer genetics consultation at the request of Beckey Rutter, NP due to a family history of cancer.  Tina Fleming presents to clinic today to discuss the possibility of a hereditary predisposition to cancer, genetic testing, and to further clarify her future cancer risks, as well as potential cancer risks for family members.    CANCER HISTORY:  Tina Fleming is a 41 y.o. female with no personal history of cancer.    RISK FACTORS:  Menarche was at age 24-13.  OCP use for approximately 0 years.  Ovaries intact: yes.  Hysterectomy: no.  Menopausal status: premenopausal. HRT use: 0 years. Colonoscopy: yes;  at 49, normal . Mammogram within the last year: yes. Number of breast biopsies: 0. Up to date with pelvic exams: yes. Any excessive radiation exposure in the past: no  Past Medical History:  Diagnosis Date   Bilateral occipital neuralgia    Complication of anesthesia    hard to wake due to  prolonged sedation   DUB (dysfunctional uterine bleeding)    Heart murmur    per pt asymtomatic, dx age 20 and told had normal echo   History of 2019 novel coronavirus disease (COVID-19) 05/2018   per pt mild symptoms that resolved   History of kidney stones    History of urethral stricture    age 78 s/p dilatation   IBS (irritable bowel syndrome)    IDA (iron deficiency anemia)    Intractable migraine with aura without status migrainosus    neruologist--- dr h. Manuella Ghazi   Thyroid nodule    per pt had biopsy of nodule approx. 2019 or 2020 , benign , and no follow up needed   Vitamin D deficiency     Past Surgical History:  Procedure  Laterality Date   DILATATION & CURETTAGE/HYSTEROSCOPY WITH MYOSURE N/A 03/20/2016   Procedure: DILATATION & CURETTAGE/HYSTEROSCOPY WITH MYOSURE;  Surgeon: Servando Salina, MD;  Location: [facility];  Service: Gynecology;  Laterality: N/A;  45 min. requested   DILATATION & CURETTAGE/HYSTEROSCOPY WITH MYOSURE N/A 12/06/2020   Procedure: DILATATION & CURETTAGE/DIAGNOSTIC HYSTEROSCOPY/ HYSTEROSCOPIC RESECTION OF ENDOMETRIAL POLYP USING MYOSURE;  Surgeon: Servando Salina, MD;  Location: [facility];  Service: Gynecology;  Laterality: N/A;   EXTRACORPOREAL SHOCK WAVE LITHOTRIPSY  04/18/2013   _0    EXTRACORPOREAL SHOCK WAVE LITHOTRIPSY Right 07/07/2021   Procedure: RIGHT EXTRACORPOREAL SHOCK WAVE LITHOTRIPSY (ESWL);  Surgeon: Lucas Mallow, MD;  Location: [facility];  Service: Urology;  Laterality: Right;   KNEE ARTHROSCOPY  04/24/2011   Procedure: ARTHROSCOPY KNEE;  Surgeon: Johnn Hai;  Location: [facility];  Service: Orthopedics;  Laterality: Right;  /Arthroscopy with debridement, right knee   LAPAROSCOPIC APPENDECTOMY  03/21/2012   Procedure: APPENDECTOMY LAPAROSCOPIC;  Surgeon: Joyice Faster. Cornett, MD;  Location: [facility];  Service: General;  Laterality: N/A;   URETHRAL STRICTURE DILATATION     age 33    FAMILY HISTORY:  We obtained a detailed, 4-generation family history.  Significant diagnoses are listed below: Family History  Problem Relation Age of Onset   Pancreatic cancer  Mother        neuroendocrine   Arthritis Mother    Hyperlipidemia Mother    Hypertension Mother    Cancer Father        skin   Arthritis Father    Hyperlipidemia Father    Hypertension Father    Diabetes Father    Ankylosing spondylitis Brother    Spondylitis Brother    Breast cancer Maternal Aunt        and thyroid cancer? d. 75s   Lung cancer Maternal Uncle    Breast cancer Paternal Aunt        73-60   Breast cancer Paternal Aunt        12-60's    Lung cancer Paternal Grandfather    Breast cancer Cousin        dx 36s   Tina Fleming has 3 brothers and 2 paternal half sisters, none have had cancer.  Tina Fleming's mother had pancreatic neuroendocrine tumor at 13 and passed at 31. Tina Fleming had 9 full aunts/uncles and several half aunts. One full aunt had cancer, likely breast/thyroid, and passed in her 67s. Her daughter had breast cancer in her 44s. An uncle had lung cancer. A maternal half aunt had lung cancer. Limited information about maternal grandparents.  Tina Fleming's father had skin cancers removed, and passed at 34. Tina Fleming had 2 paternal aunts that had breast cancer in their 22s-60s. Paternal grandfather had lung cancer. No other known cancers on this side of the family.  Tina Fleming is unaware of previous family history of genetic testing for hereditary cancer risks. There is no reported Ashkenazi Jewish ancestry. There is no known consanguinity.    GENETIC COUNSELING ASSESSMENT: Tina Fleming is a 41 y.o. female with a family history of cancer which is somewhat suggestive of a hereditary cancer syndrome and predisposition to cancer. We, therefore, discussed and recommended the following at today's visit.   DISCUSSION: We discussed that approximately 10% of cancer is hereditary. We discussed MEN1 given her mother's pancreatic neuroendocrine tumor. MEN1 is also associated with pituitary tumors and parathyroid tumors, which were not reported in her family. She does have a family history of breast cancer on both sides, there are genes associated with hereditary breast cancer as well that we can test. Cancers and risks are gene specific. We discussed that testing is beneficial for several reasons including knowing about cancer risks, identifying potential screening and risk-reduction options that may be appropriate, and to understand if other family members could be at risk for cancer and allow them to undergo genetic testing.   We reviewed the  characteristics, features and inheritance patterns of hereditary cancer syndromes. We also discussed genetic testing, including the appropriate family members to test, the process of testing, insurance coverage and turn-around-time for results. We discussed the implications of a negative, positive and/or variant of uncertain significant result. We recommended Tina Fleming pursue genetic testing for the [facility] CustomNext+RNA gene panel.   The CustomNext-Cancer+RNAinsight panel offered by [facility] includes sequencing and rearrangement analysis for the following 47 genes:  APC, ATM, AXIN2, BARD1, BMPR1A, BRCA1, BRCA2, BRIP1, CDH1, CDK4, CDKN2A, CHEK2, DICER1, EPCAM, GREM1, HOXB13, MEN1, MLH1, MSH2, MSH3, MSH6, MUTYH, NBN, NF1, NF2, NTHL1, PALB2, PMS2, POLD1, POLE, PTEN, RAD51C, RAD51D, RECQL, RET, SDHA, SDHAF2, SDHB, SDHC, SDHD, SMAD4, SMARCA4, STK11, TP53, TSC1, TSC2, and VHL.  RNA data is routinely analyzed for use in variant interpretation for all genes.  Based on Tina Fleming's family history of cancer, she meets medical criteria  for genetic testing. Despite that she meets criteria, she may still have an out of pocket cost. We discussed that if her out of pocket cost for testing is over $100, the laboratory will call and confirm whether she wants to proceed with testing.  If the out of pocket cost of testing is less than $100 she will be billed by the genetic testing laboratory.   PLAN: After considering the risks, benefits, and limitations, Tina Fleming provided informed consent to pursue genetic testing and the blood sample was sent to [facility] for analysis of the CustomNext+RNA panel. Results should be available within approximately 2-3 weeks' time, at which point they will be disclosed by telephone to Tina Fleming, as will any additional recommendations warranted by these results. Tina Fleming will receive a summary of her genetic counseling visit and a copy of her results once available. This  information will also be available in Epic.   Tina Fleming's questions were answered to her satisfaction today. Our contact information was provided should additional questions or concerns arise. Thank you for the referral and allowing [place] to share in the care of your Tina Fleming.   Faith Rogue, MS, [facility] Genetic Counselor [email or web address] Phone: [phone]  The Tina Fleming was seen for a total of 30 minutes in face-to-face genetic counseling.  Dr. Grayland Ormond was available for discussion regarding this case.   _______________________________________________________________________ For Office Staff:  Number of people involved in session: 2 Was an Intern/ student involved with case: yes; [employer] intern Blenda Nicely was also present and assisted with this case.

## 2022-02-11 DIAGNOSIS — K581 Irritable bowel syndrome with constipation: Secondary | ICD-10-CM | POA: Diagnosis not present

## 2022-02-11 DIAGNOSIS — D509 Iron deficiency anemia, unspecified: Secondary | ICD-10-CM | POA: Diagnosis not present

## 2022-02-12 ENCOUNTER — Encounter: Payer: Self-pay | Admitting: Licensed Clinical Social Worker

## 2022-02-12 ENCOUNTER — Telehealth: Payer: Self-pay | Admitting: Licensed Clinical Social Worker

## 2022-02-12 NOTE — Telephone Encounter (Signed)
I contacted Ms. Langstaff to discuss her genetic testing results. No pathogenic variants were identified in the 47 genes analyzed. Detailed clinic note to follow.   The test report has been scanned into EPIC and is located under the Molecular Pathology section of the Results Review tab.  A portion of the result report is included below for reference.      Faith Rogue, MS, [facility] Genetic Counselor [email or web address] Phone: [phone]

## 2022-02-13 ENCOUNTER — Ambulatory Visit: Payer: BC Managed Care – PPO | Admitting: Internal Medicine

## 2022-02-13 ENCOUNTER — Encounter: Payer: Self-pay | Admitting: Internal Medicine

## 2022-02-13 VITALS — BP 120/70 | HR 60 | Temp 97.6°F | Ht 65.0 in | Wt 214.0 lb

## 2022-02-13 DIAGNOSIS — Z1379 Encounter for other screening for genetic and chromosomal anomalies: Secondary | ICD-10-CM

## 2022-02-13 DIAGNOSIS — N200 Calculus of kidney: Secondary | ICD-10-CM

## 2022-02-13 DIAGNOSIS — F439 Reaction to severe stress, unspecified: Secondary | ICD-10-CM

## 2022-02-13 DIAGNOSIS — N938 Other specified abnormal uterine and vaginal bleeding: Secondary | ICD-10-CM

## 2022-02-13 DIAGNOSIS — M545 Low back pain, unspecified: Secondary | ICD-10-CM | POA: Diagnosis not present

## 2022-02-13 DIAGNOSIS — E041 Nontoxic single thyroid nodule: Secondary | ICD-10-CM

## 2022-02-13 DIAGNOSIS — R5383 Other fatigue: Secondary | ICD-10-CM

## 2022-02-13 DIAGNOSIS — D509 Iron deficiency anemia, unspecified: Secondary | ICD-10-CM

## 2022-02-13 NOTE — Progress Notes (Signed)
Patient ID: Tina Fleming, female   DOB: [birth year], 41 y.o.   MRN: [id number]   Subjective:    Patient ID: Tina Fleming, female    DOB: 1980/07/27, 41 y.o.   MRN: [id number]   Patient here for  Chief Complaint  Patient presents with   Follow-up    Back pain and discuss possible PT   .   HPI Work in for back pain. Back pain - worsened over the last month.  Worse if sits for a long period of time.  Left knee - left leg.  Taking ibuprofen.  No chest pain or sob reported.  No cough or congestion.  Increased stress.  Discussed.  Some fatigue.  Dr Jeanice Lim [facility] - on phentermine.  Discussed her back.  Discussed PT.    Past Medical History:  Diagnosis Date   Bilateral occipital neuralgia    Complication of anesthesia    hard to wake due to  prolonged sedation   DUB (dysfunctional uterine bleeding)    Heart murmur    per pt asymtomatic, dx age 80 and told had normal echo   History of 2019 novel coronavirus disease (COVID-19) 05/2018   per pt mild symptoms that resolved   History of kidney stones    History of urethral stricture    age 34 s/p dilatation   IBS (irritable bowel syndrome)    IDA (iron deficiency anemia)    Intractable migraine with aura without status migrainosus    neruologist--- dr h. Sherryll Burger   Thyroid nodule    per pt had biopsy of nodule approx. 2019 or 2020 , benign , and no follow up needed   Vitamin D deficiency    Past Surgical History:  Procedure Laterality Date   DILATATION & CURETTAGE/HYSTEROSCOPY WITH MYOSURE N/A 03/20/2016   Procedure: DILATATION & CURETTAGE/HYSTEROSCOPY WITH MYOSURE;  Surgeon: Maxie Better, MD;  Location: [facility];  Service: Gynecology;  Laterality: N/A;  45 min. requested   DILATATION & CURETTAGE/HYSTEROSCOPY WITH MYOSURE N/A 12/06/2020   Procedure: DILATATION & CURETTAGE/DIAGNOSTIC HYSTEROSCOPY/ HYSTEROSCOPIC RESECTION OF ENDOMETRIAL POLYP USING MYOSURE;  Surgeon: Maxie Better, MD;  Location: [facility];  Service: Gynecology;  Laterality: N/A;   EXTRACORPOREAL SHOCK WAVE LITHOTRIPSY  04/18/2013   @[facility]    EXTRACORPOREAL SHOCK WAVE LITHOTRIPSY Right 07/07/2021   Procedure: RIGHT EXTRACORPOREAL SHOCK WAVE LITHOTRIPSY (ESWL);  Surgeon: 07/09/2021, MD;  Location: [facility];  Service: Urology;  Laterality: Right;   KNEE ARTHROSCOPY  04/24/2011   Procedure: ARTHROSCOPY KNEE;  Surgeon: 04/26/2011;  Location: [facility];  Service: Orthopedics;  Laterality: Right;  /Arthroscopy with debridement, right knee   LAPAROSCOPIC APPENDECTOMY  03/21/2012   Procedure: APPENDECTOMY LAPAROSCOPIC;  Surgeon: 03/23/2012. Cornett, MD;  Location: [facility];  Service: General;  Laterality: N/A;   URETHRAL STRICTURE DILATATION     age 45   Family History  Problem Relation Age of Onset   Pancreatic cancer Mother        neuroendocrine   Arthritis Mother    Hyperlipidemia Mother    Hypertension Mother    Cancer Father        skin   Arthritis Father    Hyperlipidemia Father    Hypertension Father    Diabetes Father    Ankylosing spondylitis Brother    Spondylitis Brother    Breast cancer Maternal Aunt        and thyroid cancer? d. 9s   Lung cancer  Maternal Uncle    Breast cancer Paternal Aunt        [id number]   Breast cancer Paternal Aunt        50-60's   Lung cancer Paternal Grandfather    Breast cancer Cousin        dx 1550s   Social History   Socioeconomic History   Marital status: Single    Spouse name: Not on file   Number of children: Not on file   Years of education: Not on file   Highest education level: Not on file  Occupational History   Not on file  Tobacco Use   Smoking status: Never   Smokeless tobacco: Never  Vaping Use   Vaping Use: Never used  Substance and Sexual Activity   Alcohol use: No    Alcohol/week: 0.0 standard drinks of alcohol   Drug use: No   Sexual activity: Not Currently    Birth control/protection: None  Other Topics  Concern   Not on file  Social History Narrative   Not on file   Social Determinants of Health   Financial Resource Strain: Not on file  Food Insecurity: Not on file  Transportation Needs: Not on file  Physical Activity: Not on file  Stress: Not on file  Social Connections: Not on file     Review of Systems  Constitutional:  Negative for appetite change and unexpected weight change.  HENT:  Negative for congestion and sinus pressure.   Respiratory:  Negative for cough, chest tightness and shortness of breath.   Cardiovascular:  Negative for chest pain, palpitations and leg swelling.  Gastrointestinal:  Negative for abdominal pain, diarrhea, nausea and vomiting.  Genitourinary:  Negative for difficulty urinating and dysuria.  Musculoskeletal:  Positive for back pain. Negative for joint swelling and myalgias.  Skin:  Negative for color change and rash.  Neurological:  Negative for dizziness, light-headedness and headaches.  Psychiatric/Behavioral:  Negative for agitation and dysphoric mood.        Objective:     BP 120/70 (BP Location: Left Arm, Patient Position: Sitting, Cuff Size: Normal)   Pulse 60   Temp 97.6 F (36.4 C) (Oral)   Ht 5\' 5"  (1.651 m)   Wt 214 lb (97.1 kg)   SpO2 98%   BMI 35.61 kg/m  Wt Readings from Last 3 Encounters:  02/13/22 214 lb (97.1 kg)  09/12/21 228 lb (103.4 kg)  07/07/21 230 lb 14.4 oz (104.7 kg)    Physical Exam Vitals reviewed.  Constitutional:      General: She is not in acute distress.    Appearance: Normal appearance.  HENT:     Head: Normocephalic and atraumatic.     Right Ear: External ear normal.     Left Ear: External ear normal.  Eyes:     General: No scleral icterus.       Right eye: No discharge.        Left eye: No discharge.     Conjunctiva/sclera: Conjunctivae normal.  Neck:     Thyroid: No thyromegaly.  Cardiovascular:     Rate and Rhythm: Normal rate and regular rhythm.  Pulmonary:     Effort: No  respiratory distress.     Breath sounds: Normal breath sounds. No wheezing.  Abdominal:     General: Bowel sounds are normal.     Palpations: Abdomen is soft.     Tenderness: There is no abdominal tenderness.  Musculoskeletal:        General:  No swelling or tenderness.     Cervical back: Neck supple. No tenderness.  Lymphadenopathy:     Cervical: No cervical adenopathy.  Skin:    Findings: No erythema or rash.  Neurological:     Mental Status: She is alert.  Psychiatric:        Mood and Affect: Mood normal.        Behavior: Behavior normal.      Outpatient Encounter Medications as of 02/13/2022  Medication Sig   Bacillus Coagulans-Inulin (PROBIOTIC-PREBIOTIC PO) Take by mouth.   Cholecalciferol (VITAMIN D PO) Take 1 capsule by mouth every morning.   Cyanocobalamin (B-12 PO) Take by mouth daily.   FIBER SELECT GUMMIES PO Take by mouth. [employer] brand adult fiber gummies   phentermine (ADIPEX-P) 37.5 MG tablet Take 37.5 mg by mouth every morning.   No facility-administered encounter medications on file as of 02/13/2022.     Lab Results  Component Value Date   WBC 8.7 12/29/2021   HGB 13.8 12/29/2021   HCT 43.4 12/29/2021   PLT 384 12/29/2021   GLUCOSE 97 09/29/2021   CHOL 200 09/29/2021   TRIG 102 09/29/2021   HDL 53 09/29/2021   LDLCALC 127 (H) 09/29/2021   ALT 20 09/29/2021   AST 23 09/29/2021   NA 135 09/29/2021   K 4.1 09/29/2021   CL 103 09/29/2021   CREATININE 0.82 09/29/2021   BUN 12 09/29/2021   CO2 26 09/29/2021   TSH 1.256 09/29/2021   INR 1.09 07/14/2016    MM 3D SCREEN BREAST BILATERAL  Result Date: 12/18/2021 CLINICAL DATA:  Screening. EXAM: DIGITAL SCREENING BILATERAL MAMMOGRAM WITH TOMOSYNTHESIS AND CAD TECHNIQUE: Bilateral screening digital craniocaudal and mediolateral oblique mammograms were obtained. Bilateral screening digital breast tomosynthesis was performed. The images were evaluated with computer-aided detection. COMPARISON:  Previous  exam(s). ACR Breast Density Category b: There are scattered areas of fibroglandular density. FINDINGS: There are no findings suspicious for malignancy. IMPRESSION: No mammographic evidence of malignancy. A result letter of this screening mammogram will be mailed directly to the patient. RECOMMENDATION: Screening mammogram in one year. (Code:SM-B-01Y) BI-RADS CATEGORY  1: Negative. Electronically Signed   By: Edwin Cap M.D.   On: 12/18/2021 09:49       Assessment & Plan:   Problem List Items Addressed This Visit     DUB (dysfunctional uterine bleeding)    S/p D&C.  Periods as outlined.  Continue f/u with gyn. Per note, last pap 11/2020.       Fatigue    Felt to be multifactorial.  Received iron infusions.  Follow cbc.  Refer to PT for back evaluation and treatment.        Genetic testing    Negative genetic testing.  Risk score 21.4%.  Recommend yearly mammogram and yearly MRI.  Being referred to eye risk.       Iron deficiency anemia    Has seen Dr Smith Robert.  Constipation with oral iron.  Received iron infusions. hgb last check wnl.        Low back pain - Primary    Persistent low back pain as outlined.  Refer to PT for further evaluation and treatment.        Relevant Orders   Ambulatory referral to Physical Therapy   Nephrolithiasis    Urology 07/25/21 - Modena Slater - s/p right side ESWL. F/u KUB and urology one year.       Stress    Increased stress. Overall appears to be handling  things relatively well.  Follow.        Thyroid nodule    Saw Dr Gabriel Carina.  S/p biopsy.  Pt reports no change and that no further w/up warranted.  Last evaluated 10/12/18 by endocrinology.  Follow thyroid function.            Einar Pheasant, MD

## 2022-02-16 ENCOUNTER — Encounter: Payer: Self-pay | Admitting: Licensed Clinical Social Worker

## 2022-02-16 ENCOUNTER — Ambulatory Visit: Payer: Self-pay | Admitting: Licensed Clinical Social Worker

## 2022-02-16 DIAGNOSIS — Z1379 Encounter for other screening for genetic and chromosomal anomalies: Secondary | ICD-10-CM

## 2022-02-16 NOTE — Progress Notes (Signed)
HPI:   Ms. Haber was previously seen in the [facility] due to a family history of cancer and concerns regarding a hereditary predisposition to cancer. Please refer to our prior cancer genetics clinic note for more information regarding our discussion, assessment and recommendations, at the time. Ms. Hawker's recent genetic test results were disclosed to her, as were recommendations warranted by these results. These results and recommendations are discussed in more detail below.  CANCER HISTORY:  Oncology History   No history exists.    FAMILY HISTORY:  We obtained a detailed, 4-generation family history.  Significant diagnoses are listed below: Family History  Problem Relation Age of Onset   Pancreatic cancer Mother        neuroendocrine   Arthritis Mother    Hyperlipidemia Mother    Hypertension Mother    Cancer Father        skin   Arthritis Father    Hyperlipidemia Father    Hypertension Father    Diabetes Father    Ankylosing spondylitis Brother    Spondylitis Brother    Breast cancer Maternal Aunt        and thyroid cancer? d. 23s   Lung cancer Maternal Uncle    Breast cancer Paternal Aunt        31-60   Breast cancer Paternal Aunt        26-60's   Lung cancer Paternal Grandfather    Breast cancer Cousin        dx 54s   Ms. Takaki has 3 brothers and 2 paternal half sisters, none have had cancer.   Ms. Beckmann's mother had pancreatic neuroendocrine tumor at 25 and passed at 61. Patient had 9 full aunts/uncles and several half aunts. One full aunt had cancer, likely breast/thyroid, and passed in her 71s. Her daughter had breast cancer in her 46s. An uncle had lung cancer. A maternal half aunt had lung cancer. Limited information about maternal grandparents.   Ms. Forness's father had skin cancers removed, and passed at 50. Patient had 2 paternal aunts that had breast cancer in their 66s-60s. Paternal grandfather had lung cancer. No other known cancers  on this side of the family.   Ms. Duvall is unaware of previous family history of genetic testing for hereditary cancer risks. There is no reported Ashkenazi Jewish ancestry. There is no known consanguinity.      GENETIC TEST RESULTS:  The [employer] CustomNext+RNA Panel found no pathogenic mutations.   The CustomNext-Cancer+RNAinsight panel offered by [facility] includes sequencing and rearrangement analysis for the following 47 genes:  APC, ATM, AXIN2, BARD1, BMPR1A, BRCA1, BRCA2, BRIP1, CDH1, CDK4, CDKN2A, CHEK2, DICER1, EPCAM, GREM1, HOXB13, MEN1, MLH1, MSH2, MSH3, MSH6, MUTYH, NBN, NF1, NF2, NTHL1, PALB2, PMS2, POLD1, POLE, PTEN, RAD51C, RAD51D, RECQL, RET, SDHA, SDHAF2, SDHB, SDHC, SDHD, SMAD4, SMARCA4, STK11, TP53, TSC1, TSC2, and VHL.  RNA data is routinely analyzed for use in variant interpretation for all genes.   The test report has been scanned into EPIC and is located under the Molecular Pathology section of the Results Review tab.  A portion of the result report is included below for reference. Genetic testing reported out on 02/11/2022.      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  At this time, it is unknown if this variant is associated with an increased risk for cancer or if it is benign, but most uncertain variants are reclassified to benign. It  should not be used to make medical management decisions. With time, we suspect the laboratory will determine the significance of this variant, if any. If the laboratory reclassifies this variant, we will attempt to contact Ms. Terlecki to discuss it further.   Even though a pathogenic variant was not identified, possible explanations for the cancer in the family may include: There may be no hereditary risk for cancer in the family. The cancers in Ms. Dorough and/or her family may be sporadic/familial or due to other genetic and environmental factors. There may be a gene mutation  in one of these genes that current testing methods cannot detect but that chance is small. There could be another gene that has not yet been discovered, or that we have not yet tested, that is responsible for the cancer diagnoses in the family.  It is also possible there is a hereditary cause for the cancer in the family that Ms. Stlouis did not inherit.  Therefore, it is important to remain in touch with cancer genetics in the future so that we can continue to offer Ms. Larch the most up to date genetic testing.   ADDITIONAL GENETIC TESTING:  We discussed with Ms. Platts that her genetic testing was fairly extensive.  If there are additional relevant genes identified to increase cancer risk that can be analyzed in the future, we would be happy to discuss and coordinate this testing at that time.    CANCER SCREENING RECOMMENDATIONS:  Ms. Minami's test result is considered negative (normal).  This means that we have not identified a hereditary cause for her family history of cancer at this time.   An individual's cancer risk and medical management are not determined by genetic test results alone. Overall cancer risk assessment incorporates additional factors, including personal medical history, family history, and any available genetic information that may result in a personalized plan for cancer prevention and surveillance. Therefore, it is recommended she continue to follow the cancer management and screening guidelines provided by her primary healthcare provider.  Based on the reported personal and family history, specific cancer screenings for Ms. Rafeef Lau Hanna and her family include:   Breast Cancer Screening:  The Tyrer-Cuzick model is one of multiple prediction models developed to estimate an individual's lifetime risk of developing breast cancer. The Tyrer-Cuzick model is endorsed by the [employer]  (NCCN). This model includes many risk factors such as family  history, endogenous estrogen exposure, and benign breast disease. The calculation is highly-dependent on the accuracy of clinical data provided by the patient and can change over time. The Tyrer-Cuzick model may be repeated to reflect new information in her personal or family history in the future.    Ms. Kapusta's Tyrer-Cuzick risk score is 21.4%.  For women with a greater than 20% lifetime risk of breast cancer, the NCCN recommends the following:    1.   Clinical encounter every 6-12 months to begin when identified as being at increased risk, but not before age 22    2.   Annual mammograms, tomosynthesis is recommended starting 10 years earlier than the youngest breast cancer diagnosis in the family or at age 20 (whichever comes first), but not before age 45     3.   Annual breast MRI starting 10 years earlier than the youngest breast cancer diagnosis in the family or at age 90 (whichever comes first), but not before age 31    We have placed a referral for [facility].  RECOMMENDATIONS FOR FAMILY MEMBERS:   Since she did not inherit a identifiable mutation in a cancer predisposition gene included on this panel, her children could not have inherited a known mutation from her in one of these genes. Individuals in this family might be at some increased risk of developing cancer, over the general population risk, due to the family history of cancer.  Individuals in the family should notify their providers of the family history of cancer. We recommend women in this family have a yearly mammogram beginning at age 82, or 84 years younger than the earliest onset of cancer, an annual clinical breast exam, and perform monthly breast self-exams.  Family members should have colonoscopies by at age 35, or earlier, as recommended by their providers. We do not recommend familial testing for the MUTYH variant of uncertain significance (VUS).  FOLLOW-UP:  Lastly, we discussed with Ms. Krummel  that cancer genetics is a rapidly advancing field and it is possible that new genetic tests will be appropriate for her and/or her family members in the future. We encouraged her to remain in contact with cancer genetics on an annual basis so we can update her personal and family histories and let her know of advances in cancer genetics that may benefit this family.   Our contact number was provided. Ms. Shadrick's questions were answered to her satisfaction, and she knows she is welcome to call us at anytime with additional questions or concerns.    Faith Rogue, MS, [facility] Genetic Counselor [email or web address] Phone: [phone]

## 2022-02-18 ENCOUNTER — Encounter: Payer: Self-pay | Admitting: Oncology

## 2022-02-21 ENCOUNTER — Encounter: Payer: Self-pay | Admitting: Internal Medicine

## 2022-02-21 NOTE — Assessment & Plan Note (Signed)
Negative genetic testing.  Risk score 21.4%.  Recommend yearly mammogram and yearly MRI.  Being referred to eye risk.

## 2022-02-21 NOTE — Assessment & Plan Note (Signed)
S/p D&C.  Periods as outlined.  Continue f/u with gyn. Per note, last pap 11/2020.

## 2022-02-21 NOTE — Assessment & Plan Note (Signed)
Felt to be multifactorial.  Received iron infusions.  Follow cbc.  Refer to PT for back evaluation and treatment.

## 2022-02-21 NOTE — Assessment & Plan Note (Signed)
Urology 07/25/21 - Link Snuffer - s/p right side ESWL. F/u KUB and urology one year.

## 2022-02-21 NOTE — Assessment & Plan Note (Signed)
Persistent low back pain as outlined.  Refer to PT for further evaluation and treatment.

## 2022-02-21 NOTE — Assessment & Plan Note (Signed)
Saw Dr Gabriel Carina.  S/p biopsy.  Pt reports no change and that no further w/up warranted.  Last evaluated 10/12/18 by endocrinology.  Follow thyroid function.

## 2022-02-21 NOTE — Assessment & Plan Note (Addendum)
Has seen Dr Janese Banks.  Constipation with oral iron.  Received iron infusions. hgb last check wnl.

## 2022-02-21 NOTE — Assessment & Plan Note (Signed)
Increased stress.  Overall appears to be handling things relatively well.  Follow.   

## 2022-02-26 ENCOUNTER — Encounter: Payer: Self-pay | Admitting: Internal Medicine

## 2022-02-26 NOTE — Telephone Encounter (Signed)
Ok to change appt and schedule 3 month f/u.

## 2022-03-06 ENCOUNTER — Inpatient Hospital Stay: Payer: BC Managed Care – PPO | Attending: Nurse Practitioner | Admitting: Oncology

## 2022-03-06 ENCOUNTER — Encounter: Payer: Self-pay | Admitting: Oncology

## 2022-03-06 ENCOUNTER — Other Ambulatory Visit: Payer: BC Managed Care – PPO

## 2022-03-06 VITALS — BP 104/69 | HR 81 | Temp 97.9°F | Resp 18 | Wt 213.4 lb

## 2022-03-06 DIAGNOSIS — Z803 Family history of malignant neoplasm of breast: Secondary | ICD-10-CM | POA: Insufficient documentation

## 2022-03-06 DIAGNOSIS — D500 Iron deficiency anemia secondary to blood loss (chronic): Secondary | ICD-10-CM | POA: Diagnosis not present

## 2022-03-06 DIAGNOSIS — Z801 Family history of malignant neoplasm of trachea, bronchus and lung: Secondary | ICD-10-CM | POA: Insufficient documentation

## 2022-03-06 DIAGNOSIS — N920 Excessive and frequent menstruation with regular cycle: Secondary | ICD-10-CM | POA: Diagnosis not present

## 2022-03-06 DIAGNOSIS — Z8616 Personal history of COVID-19: Secondary | ICD-10-CM | POA: Insufficient documentation

## 2022-03-08 ENCOUNTER — Encounter: Payer: Self-pay | Admitting: Oncology

## 2022-03-08 NOTE — Progress Notes (Signed)
Hematology/Oncology Consult note [facility]  [phone] [phone]  Patient Care Team: Einar Pheasant, MD as PCP - General (Internal Medicine) Sindy Guadeloupe, MD as Consulting Physician (Hematology and Oncology)   Name of the patient: Tina Fleming  [id number]  November 10, 1980   Date of visit: 03/08/22  Diagnosis-iron deficiency anemia High risk for breast cancer   Chief complaint/ Reason for visit- patient with history of iron deficiency anemia and now referred for high risk of breast cancer  Heme/Onc history: Patient is a 41 year old female who follows up with me for iron deficiency anemia secondary to menorrhagia.  She has received IV iron in the past.  She has now been referred for high risk for breast cancer risk.  Patient was seen by Arletta Bale from genetic counseling.  Menarche at the age of 64.  She is premenopausal.  Her mother had pancreatic neuroendocrine tumor.  Patient's first cousin had breast cancer in her 10s.  Father had skin cancers.  She had genetic testing done which was negativ for BRCA 1 and 2.  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  Her Tyrer-Cuzick risk score for lifetime risk of breast cancer was 21.4%.  Interval history-she is doing well presently and denies any complaints  ECOG PS- 0 Pain scale- 0   Review of systems- Review of Systems  Constitutional:  Negative for chills, fever, malaise/fatigue and weight loss.  HENT:  Negative for congestion, ear discharge and nosebleeds.   Eyes:  Negative for blurred vision.  Respiratory:  Negative for cough, hemoptysis, sputum production, shortness of breath and wheezing.   Cardiovascular:  Negative for chest pain, palpitations, orthopnea and claudication.  Gastrointestinal:  Negative for abdominal pain, blood in stool, constipation, diarrhea, heartburn, melena, nausea and vomiting.  Genitourinary:  Negative for dysuria, flank pain,  frequency, hematuria and urgency.  Musculoskeletal:  Negative for back pain, joint pain and myalgias.  Skin:  Negative for rash.  Neurological:  Negative for dizziness, tingling, focal weakness, seizures, weakness and headaches.  Endo/Heme/Allergies:  Does not bruise/bleed easily.  Psychiatric/Behavioral:  Negative for depression and suicidal ideas. The patient does not have insomnia.       Allergies  Allergen Reactions   Diphenhydramine Other (See Comments)    Hyperactive, wakefulness   Sulfa Antibiotics Rash     Past Medical History:  Diagnosis Date   Bilateral occipital neuralgia    Complication of anesthesia    hard to wake due to  prolonged sedation   DUB (dysfunctional uterine bleeding)    Heart murmur    per pt asymtomatic, dx age 78 and told had normal echo   History of 2019 novel coronavirus disease (COVID-19) 05/2018   per pt mild symptoms that resolved   History of kidney stones    History of urethral stricture    age 14 s/p dilatation   IBS (irritable bowel syndrome)    IDA (iron deficiency anemia)    Intractable migraine with aura without status migrainosus    neruologist--- dr h. Manuella Ghazi   Thyroid nodule    per pt had biopsy of nodule approx. 2019 or 2020 , benign , and no follow up needed   Vitamin D deficiency      Past Surgical History:  Procedure Laterality Date   DILATATION & CURETTAGE/HYSTEROSCOPY WITH MYOSURE N/A 03/20/2016   Procedure: DILATATION & CURETTAGE/HYSTEROSCOPY WITH MYOSURE;  Surgeon: Servando [place], MD;  Location: [facility];  Service: Gynecology;  Laterality: N/A;  45 min. requested   DILATATION & CURETTAGE/HYSTEROSCOPY WITH MYOSURE N/A 12/06/2020   Procedure: DILATATION & CURETTAGE/DIAGNOSTIC HYSTEROSCOPY/ HYSTEROSCOPIC RESECTION OF ENDOMETRIAL POLYP USING MYOSURE;  Surgeon: Servando [place]ina, MD;  Location: [place];  Service: Gynecology;  Laterality: N/A;   EXTRACORPOREAL SHOCK WAVE LITHOTRIPSY  04/18/2013   _0     EXTRACORPOREAL SHOCK WAVE LITHOTRIPSY Right 07/07/2021   Procedure: RIGHT EXTRACORPOREAL SHOCK WAVE LITHOTRIPSY (ESWL);  Surgeon: Lucas Mallow, MD;  Location[facility];  Service: Urology;  Laterality: Right;   KNEE ARTHROSCOPY  04/24/2011   Procedure: ARTHROSCOPY KNEE;  Surgeon: Johnn Hai;  Location: [place];  Service: Orthopedics;  Laterality: Right;  /Arthroscopy with debridement, right knee   LAPAROSCOPIC APPENDECTOMY  03/21/2012   Procedure: APPENDECTOMY LAPAROSCOPIC;  Surgeon: Joyice Faster. Cornett, MD;  Location: [facility];  Service: General;  Laterality: N/A;   URETHRAL STRICTURE DILATATION     age 65    Social History   Socioeconomic History   Marital status: Single    Spouse name: Not on file   Number of children: Not on file   Years of education: Not on file   Highest education level: Not on file  Occupational History   Not on file  Tobacco Use   Smoking status: Never   Smokeless tobacco: Never  Vaping Use   Vaping Use: Never used  Substance and Sexual Activity   Alcohol use: No    Alcohol/week: 0.0 standard drinks of alcohol   Drug use: No   Sexual activity: Not Currently    Birth control/protection: None  Other Topics Concern   Not on file  Social History Narrative   Not on file   Social Determinants of Health   Financial Resource Strain: Not on file  Food Insecurity: Not on file  Transportation Needs: Not on file  Physical Activity: Not on file  Stress: Not on file  Social Connections: Not on file  Intimate Partner Violence: Not on file    Family History  Problem Relation Age of Onset   Pancreatic cancer Mother        neuroendocrine   Arthritis Mother    Hyperlipidemia Mother    Hypertension Mother    Cancer Father        skin   Arthritis Father    Hyperlipidemia Father    Hypertension Father    Diabetes Father    Ankylosing spondylitis Brother    Spondylitis Brother    Breast cancer Maternal Aunt         and thyroid cancer? d. 30s   Lung cancer Maternal Uncle    Breast cancer Paternal Aunt        33-60   Breast cancer Paternal Aunt        19-60's   Lung cancer Paternal Grandfather    Breast cancer Cousin        dx 15s     Current Outpatient Medications:    Bacillus Coagulans-Inulin (PROBIOTIC-PREBIOTIC PO), Take by mouth., Disp: , Rfl:    Cholecalciferol (VITAMIN D PO), Take 1 capsule by mouth every morning., Disp: , Rfl:    Cyanocobalamin (B-12 PO), Take by mouth daily., Disp: , Rfl:    FIBER SELECT GUMMIES PO, Take by mouth. [employer] brand adult fiber gummies, Disp: , Rfl:    phentermine (ADIPEX-P) 37.5 MG tablet, Take 37.5 mg by mouth every morning., Disp: , Rfl:   Physical exam:  Vitals:   03/06/22 0950  BP: 104/69  Pulse: 81  Resp: 18  Temp: 97.9 F (36.6 C)  SpO2: 100%  Weight: 213 lb 6.4 oz (96.8 kg)   Physical Exam Constitutional:      General: She is not in acute distress. Cardiovascular:     Rate and Rhythm: Normal rate and regular rhythm.     Heart sounds: Normal heart sounds.  Pulmonary:     Effort: Pulmonary effort is normal.     Breath sounds: Normal breath sounds.  Skin:    General: Skin is warm and dry.  Neurological:     Mental Status: She is alert and oriented to person, place, and time.   Breast exam: No palpable masses in the breast.  No palpable bilateral axillary adenopathy.     Latest Ref Rng & Units 09/29/2021    4:00 PM  CMP  Glucose 70 - 99 mg/dL 97   BUN 6 - 20 mg/dL 12   Creatinine 0.44 - 1.00 mg/dL 0.82   Sodium 135 - 145 mmol/L 135   Potassium 3.5 - 5.1 mmol/L 4.1   Chloride 98 - 111 mmol/L 103   CO2 22 - 32 mmol/L 26   Calcium 8.9 - 10.3 mg/dL 9.6   Total Protein 6.5 - 8.1 g/dL 8.1   Total Bilirubin 0.3 - 1.2 mg/dL 0.9   Alkaline Phos 38 - 126 U/L 73   AST 15 - 41 U/L 23   ALT 0 - 44 U/L 20       Latest Ref Rng & Units 12/29/2021    2:39 PM  CBC  WBC 4.0 - 10.5 K/uL 8.7   Hemoglobin 12.0 - 15.0 g/dL 13.8   Hematocrit 36.0  - 46.0 % 43.4   Platelets 150 - 400 K/uL 384      Assessment and plan- Patient is a 41 y.o. female with history of iron deficiency anemia referred for high risk of breast cancer  Discussed the results of her genetic testing which did not show any pathologic variants but did show a variant of uncertain significance.  That would not change her management presently.  However given that her lifetime risk of developing breast cancer as per Tyrer-Cuzick model is at 21.4% which is greater than 20% she would fall under high risk group.    NCCN recommends breast exams every 6 to 12 months which I will start doing at this time.  She already had a mammogram in July 2023 which was normal and will continue getting yearly mammograms.  She would also meet criteria for annual breast MRI which can be done alternating with her mammograms.  MRI is higher sensitivity as compared to mammogram and this population.  However explained to the patient that sometimes MRI can also give rise to false positives and increase the need for breast biopsies.  We could start off with MRIs for the next few years if her insurance approves and then decide if she wants to continue along with mammograms or not.  I will see her back in 6 months with CBC ferritin and iron studies and for her breast exam   Visit Diagnosis 1. Family history of breast cancer      Dr. Randa Evens, MD, MPH [facility] at [facility] [phone] 03/08/2022 8:40 PM

## 2022-03-10 ENCOUNTER — Encounter: Payer: BC Managed Care – PPO | Admitting: Oncology

## 2022-03-11 ENCOUNTER — Encounter: Payer: BC Managed Care – PPO | Admitting: Oncology

## 2022-03-11 DIAGNOSIS — D509 Iron deficiency anemia, unspecified: Secondary | ICD-10-CM | POA: Diagnosis not present

## 2022-03-11 DIAGNOSIS — K581 Irritable bowel syndrome with constipation: Secondary | ICD-10-CM | POA: Diagnosis not present

## 2022-03-20 ENCOUNTER — Ambulatory Visit: Payer: BC Managed Care – PPO | Admitting: Internal Medicine

## 2022-03-25 ENCOUNTER — Ambulatory Visit: Payer: BC Managed Care – PPO | Admitting: Internal Medicine

## 2022-03-30 ENCOUNTER — Ambulatory Visit: Payer: BC Managed Care – PPO | Admitting: Physical Therapy

## 2022-04-01 ENCOUNTER — Ambulatory Visit: Payer: BC Managed Care – PPO | Admitting: Physical Therapy

## 2022-04-01 ENCOUNTER — Ambulatory Visit: Payer: BC Managed Care – PPO | Attending: Internal Medicine | Admitting: Physical Therapy

## 2022-04-01 ENCOUNTER — Other Ambulatory Visit: Payer: Self-pay

## 2022-04-01 DIAGNOSIS — M5459 Other low back pain: Secondary | ICD-10-CM | POA: Diagnosis not present

## 2022-04-01 DIAGNOSIS — G8929 Other chronic pain: Secondary | ICD-10-CM | POA: Diagnosis not present

## 2022-04-01 DIAGNOSIS — M545 Low back pain, unspecified: Secondary | ICD-10-CM | POA: Diagnosis not present

## 2022-04-01 DIAGNOSIS — M533 Sacrococcygeal disorders, not elsewhere classified: Secondary | ICD-10-CM | POA: Diagnosis not present

## 2022-04-01 NOTE — Therapy (Signed)
OUTPATIENT PHYSICAL THERAPY THORACOLUMBAR EVALUATION   Patient Name: SIMRIT GOHLKE MRN: [id number] DOB:Aug 24, 1980, 41 y.o., female Today's Date: 04/01/2022   PT End of Session - 04/01/22 1449     Visit Number 1    Number of Visits 20    Date for PT Re-Evaluation 06/10/22    Authorization Type BC[employer]    Authorization Time Period 04/01/22-06/10/2022    Authorization - Visit Number 1    Authorization - Number of Visits 20    Progress Note Due on Visit 10    PT Start Time 1330    PT Stop Time 1415    PT Time Calculation (min) 45 min    Activity Tolerance Patient tolerated treatment well    Behavior During Therapy [facility] for tasks assessed/performed             Past Medical History:  Diagnosis Date   Bilateral occipital neuralgia    Complication of anesthesia    hard to wake due to  prolonged sedation   DUB (dysfunctional uterine bleeding)    Heart murmur    per pt asymtomatic, dx age 83 and told had normal echo   History of 2019 novel coronavirus disease (COVID-19) 05/2018   per pt mild symptoms that resolved   History of kidney stones    History of urethral stricture    age 89 s/p dilatation   IBS (irritable bowel syndrome)    IDA (iron deficiency anemia)    Intractable migraine with aura without status migrainosus    neruologist--- dr h. Sherryll Burger   Thyroid nodule    per pt had biopsy of nodule approx. 2019 or 2020 , benign , and no follow up needed   Vitamin D deficiency    Past Surgical History:  Procedure Laterality Date   DILATATION & CURETTAGE/HYSTEROSCOPY WITH MYOSURE N/A 03/20/2016   Procedure: DILATATION & CURETTAGE/HYSTEROSCOPY WITH MYOSURE;  Surgeon: Maxie Better, MD;  Location: [facility];  Service: Gynecology;  Laterality: N/A;  45 min. requested   DILATATION & CURETTAGE/HYSTEROSCOPY WITH MYOSURE N/A 12/06/2020   Procedure: DILATATION & CURETTAGE/DIAGNOSTIC HYSTEROSCOPY/ HYSTEROSCOPIC RESECTION OF ENDOMETRIAL POLYP USING MYOSURE;  Surgeon: Maxie Better,  MD;  Location: [facility];  Service: Gynecology;  Laterality: N/A;   EXTRACORPOREAL SHOCK WAVE LITHOTRIPSY  04/18/2013   @[facility]    EXTRACORPOREAL SHOCK WAVE LITHOTRIPSY Right 07/07/2021   Procedure: RIGHT EXTRACORPOREAL SHOCK WAVE LITHOTRIPSY (ESWL);  Surgeon: 07/09/2021, MD;  Location: [facility];  Service: Urology;  Laterality: Right;   KNEE ARTHROSCOPY  04/24/2011   Procedure: ARTHROSCOPY KNEE;  Surgeon: 04/26/2011;  Location: [facility];  Service: Orthopedics;  Laterality: Right;  /Arthroscopy with debridement, right knee   LAPAROSCOPIC APPENDECTOMY  03/21/2012   Procedure: APPENDECTOMY LAPAROSCOPIC;  Surgeon: 03/23/2012. Cornett, MD;  Location: [facility];  Service: General;  Laterality: N/A;   URETHRAL STRICTURE DILATATION     age 29   Patient Active Problem List   Diagnosis Date Noted   Genetic testing 02/16/2022   Obesity (BMI 35.0-39.9 without comorbidity) 09/21/2021   Iron deficiency anemia 04/11/2021   Abdominal pain 03/31/2021   Traumatic ecchymosis of right lower leg 03/23/2020   Hematoma of right lower extremity 03/23/2020   Nausea 08/21/2019   Abdominal bloating 08/21/2019   History of COVID-19 06/24/2019   URI (upper respiratory infection) 08/26/2018   Thyroid nodule 02/27/2018   Intractable migraine with aura with status migrainosus 05/14/2017   Stress 01/10/2017   DUB (dysfunctional uterine  bleeding) 07/16/2016   Low back pain 04/30/2015   Fatigue 02/08/2015   Skin lesion of breast 08/06/2014   Health care maintenance 08/06/2014   Nephrolithiasis 02/11/2013   Migraine 02/11/2013   History of frequent urinary tract infections 02/11/2013   Syncope 02/11/2013   IBS (irritable bowel syndrome) 02/11/2013   Anemia 02/11/2013   Vitamin D deficiency 02/11/2013    PCP: Dr.Charlene Lorin Picket  REFERRING PROVIDER: Dr.Charlene Scott  REFERRING DIAG: Midline Low Back Pain   Rationale for Evaluation and Treatment:  Rehabilitation  THERAPY DIAG:  Other low back pain  Chronic left SI joint pain  ONSET DATE: 05/25/21  SUBJECTIVE:                                                                                                                                                                                           SUBJECTIVE STATEMENT: See pertinent information   PERTINENT HISTORY:  Pt reports that she noticed her back was locking up when she stood up. It has been getting progressively worse since then. She feels the pain when she sits for awhile or walks for a while. She exercises by doing dance videos and using walking pads.  PAIN:  Are you having pain? No  PRECAUTIONS: None  WEIGHT BEARING RESTRICTIONS: No  FALLS:  Has patient fallen in last 6 months? 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   EXAM: ABDOMEN - 1 VIEW   COMPARISON:  March 31, 2021 June 25, 2021   FINDINGS: There is a 7 mm stone in the right mid kidney and a 3.5 mm stone in the lower right kidney. No other renal stones identified. There is a stable phleboliths in the deep left pelvis. A sclerotic rounded region projects over the left side of the sacrum measuring 4 mm, unchanged since November 2022 and June 25, 2021 but new since 2014.   IMPRESSION: 1. Two stones in the right kidney as above. 2. The 4 mm sclerotic focus projected over the left side of the sacrum is unchanged since November 2022 but new since 2014. This may represent  a  bone island but is nonspecific. This finding is more medially located than expected for a ureteral stone. 3. Phlebolith in the deep left pelvis.     Electronically Signed   By: Gerome Sam III M.D.   On: 07/07/2021 10:34  PATIENT SURVEYS:  FOTO 61/100  SCREENING FOR RED FLAGS: Bowel or bladder incontinence: No Spinal tumors: No Cauda equina syndrome: No Compression fracture: No Abdominal aneurysm: No  COGNITION: Overall cognitive status: Within functional limits for tasks assessed     SENSATION: WFL  MUSCLE LENGTH: Hamstrings: Right 90 deg; Left 90 deg Thomas test: Negative Bilateral  Ely's test: Negative Bilateral   POSTURE: decreased lumbar lordosis  PALPATION: Left PSIS   LUMBAR ROM:   AROM eval  Flexion 100%  Extension 100%  Right lateral flexion 100%  Left lateral flexion 100%  Right rotation 100%  Left rotation 100%   (Blank rows = not tested)  LOWER EXTREMITY ROM:       Active  Right 04/01/2022 Left 04/01/2022  Hip flexion 120 120  Hip extension 30 30  Hip abduction 45 45  Hip adduction 30 30  Hip internal rotation 45 45  Hip external rotation 45 45  Knee flexion 135 135  Knee extension 0 0  Ankle dorsiflexion 20 20  Ankle plantarflexion 50 50  Ankle inversion 35 35  Ankle eversion 15 15   (Blank rows = not tested)      LOWER EXTREMITY MMT:      MMT Right eval Left eval  Hip flexion 5 4+  Hip extension 5 5  Hip abduction 4+ 4+  Hip adduction 4- -  Hip internal rotation    Hip external rotation    Knee flexion 5 5  Knee extension 5 5  Ankle dorsiflexion 5 5  Ankle plantarflexion    Ankle inversion    Ankle eversion     (Blank rows = not tested)   LUMBAR SPECIAL TESTS:  SI Compression/distraction test: Negative, FABER test: Negative, Gaenslen's test: Negative, Trendelenburg sign: Negative, and Thomas test: Negative,   FUNCTIONAL TESTS:  Single leg stance: NT   GAIT: Distance walked: 50 ft  Assistive device  utilized: None Level of assistance: Complete Independence Comments: No gait deficits noted   TODAY'S TREATMENT:                                                                                                                              DATE: 04/01/22  Lower Trunk Rotation 3 x 10 Forward flexion with butterfly stretch 3 x 30 sec      PATIENT EDUCATION:  Education details: form and technique for appropriate exercise and explanation of deficits and SIJ PT diagnosis  Person educated: Patient Education method: Explanation, Demonstration, Verbal cues, and Handouts Education comprehension: verbalized understanding, returned demonstration, and verbal cues required  HOME EXERCISE PROGRAM: Access Code: [email or web address] URL: [email or web address] Date: 04/01/2022 Prepared by: Ellin Goodie  Exercises - Supine Lower Trunk Rotation  -  7 x weekly - 3 sets - 10 reps - Bound Angle Hands Forward   - 1 x daily - 3 reps - 60 sec hold  ASSESSMENT:  CLINICAL IMPRESSION: Patient is a 41 y.o. white woman who was seen today for physical therapy evaluation and treatment for left sided low back pain. She presents with signs and symptoms of sacroiliac joint pain syndrome with pain localized to left PSIS, she has a relatively flat lumbar spine with sacral lesions on x-ray, and reduced ability to walk for prolonged distances. Despite all special tests being negative, pt likely to have some SIJ pathology especially given imaging showing spot on sacrum. She has limited deficits that include decreased hip strength and pain with walking or sitting. She will benefit from skilled PT to address these aforementioned deficits to return to walking and sitting for job related duties without pain or discomfort.    OBJECTIVE IMPAIRMENTS: decreased mobility, decreased strength and pain.   ACTIVITY LIMITATIONS: carrying, bending, squatting, transfers, and locomotion level  PARTICIPATION LIMITATIONS:  occupation  PERSONAL FACTORS: Past/current experiences, Time since onset of injury/illness/exacerbation, and 1-2 comorbidities: occipital neuralgia and h/o brother with ankylosing spondylosis are also affecting patient's functional outcome.   REHAB POTENTIAL: Good  CLINICAL DECISION MAKING: Stable/uncomplicated  EVALUATION COMPLEXITY: Low   GOALS: Goals reviewed with patient? No  SHORT TERM GOALS: Target date: 04/15/2022  Pt will be independent with HEP in order to improve strength and balance in order to decrease fall risk and improve function at home and work. Baseline: NT  Goal status: INITIAL  LONG TERM GOALS: Target date: 06/10/2022  Patient will have improved function and activity level as evidenced by an increase in FOTO score by 10 points or more.  Baseline: 60/100 with target of 71  Goal status: INITIAL  2.  Patient will improve hip strength by 1/3 MMT grade for improved spinal stability and reduction of hip pain.  Baseline: Hip Add R/L 4-/4- Hip Flex L 4+, Hip Abd R/L 4+/4+ Goal status: INITIAL   PLAN:  PT FREQUENCY: 1-2x/week  PT DURATION: 10 weeks  PLANNED INTERVENTIONS: Therapeutic exercises, Gait training, Joint mobilization, Joint manipulation, Electrical stimulation, Spinal manipulation, Spinal mobilization, Cryotherapy, Moist heat, Manual therapy, and Re-evaluation.  PLAN FOR NEXT SESSION: Single leg stance, gait analysis, Hip IR and ER MMT. Progress hip strengthening exercises.   Ellin Goodie PT, DPT  04/01/2022, 2:52 PM

## 2022-04-06 ENCOUNTER — Ambulatory Visit: Payer: BC Managed Care – PPO | Admitting: Physical Therapy

## 2022-04-08 ENCOUNTER — Encounter: Payer: Self-pay | Admitting: Physical Therapy

## 2022-04-08 ENCOUNTER — Ambulatory Visit: Payer: BC Managed Care – PPO | Admitting: Physical Therapy

## 2022-04-08 DIAGNOSIS — G8929 Other chronic pain: Secondary | ICD-10-CM | POA: Diagnosis not present

## 2022-04-08 DIAGNOSIS — D509 Iron deficiency anemia, unspecified: Secondary | ICD-10-CM | POA: Diagnosis not present

## 2022-04-08 DIAGNOSIS — M545 Low back pain, unspecified: Secondary | ICD-10-CM | POA: Diagnosis not present

## 2022-04-08 DIAGNOSIS — M5459 Other low back pain: Secondary | ICD-10-CM

## 2022-04-08 DIAGNOSIS — E669 Obesity, unspecified: Secondary | ICD-10-CM | POA: Diagnosis not present

## 2022-04-08 DIAGNOSIS — K581 Irritable bowel syndrome with constipation: Secondary | ICD-10-CM | POA: Diagnosis not present

## 2022-04-08 DIAGNOSIS — M533 Sacrococcygeal disorders, not elsewhere classified: Secondary | ICD-10-CM | POA: Diagnosis not present

## 2022-04-08 NOTE — Therapy (Signed)
OUTPATIENT PHYSICAL THERAPY TREATMENT NOTE   Patient Name: Tina Fleming MRN: [id number] DOB:April 17, 1981, 41 y.o., female Today's Date: 04/08/2022  PCP: Dr. Dale [place]  REFERRING PROVIDER: Dr. Dale [place]   END OF SESSION:   PT End of Session - 04/08/22 1641     Visit Number 2    Number of Visits 20    Date for PT Re-Evaluation 06/10/22    Authorization Type BCBS    Authorization Time Period 04/01/22-06/10/2022    Authorization - Visit Number 2    Authorization - Number of Visits 20    Progress Note Due on Visit 10    PT Start Time 1545    PT Stop Time 1630    PT Time Calculation (min) 45 min    Activity Tolerance Patient tolerated treatment well    Behavior During Therapy [facility] for tasks assessed/performed             Past Medical History:  Diagnosis Date   Bilateral occipital neuralgia    Complication of anesthesia    hard to wake due to  prolonged sedation   DUB (dysfunctional uterine bleeding)    Heart murmur    per pt asymtomatic, dx age 67 and told had normal echo   History of 2019 novel coronavirus disease (COVID-19) 05/2018   per pt mild symptoms that resolved   History of kidney stones    History of urethral stricture    age 65 s/p dilatation   IBS (irritable bowel syndrome)    IDA (iron deficiency anemia)    Intractable migraine with aura without status migrainosus    neruologist--- dr h. Tina Burger   Thyroid nodule    per pt had biopsy of nodule approx. 2019 or 2020 , benign , and no follow up needed   Vitamin D deficiency    Past Surgical History:  Procedure Laterality Date   DILATATION & CURETTAGE/HYSTEROSCOPY WITH MYOSURE N/A 03/20/2016   Procedure: DILATATION & CURETTAGE/HYSTEROSCOPY WITH MYOSURE;  Surgeon: Maxie Better, MD;  Location: [facility];  Service: Gynecology;  Laterality: N/A;  45 min. requested   DILATATION & CURETTAGE/HYSTEROSCOPY WITH MYOSURE N/A 12/06/2020   Procedure: DILATATION & CURETTAGE/DIAGNOSTIC HYSTEROSCOPY/ HYSTEROSCOPIC  RESECTION OF ENDOMETRIAL POLYP USING MYOSURE;  Surgeon: Maxie Better, MD;  Location: [facility];  Service: Gynecology;  Laterality: N/A;   EXTRACORPOREAL SHOCK WAVE LITHOTRIPSY  04/18/2013   @[facility]    EXTRACORPOREAL SHOCK WAVE LITHOTRIPSY Right 07/07/2021   Procedure: RIGHT EXTRACORPOREAL SHOCK WAVE LITHOTRIPSY (ESWL);  Surgeon: 07/09/2021, MD;  Location: [facility];  Service: Urology;  Laterality: Right;   KNEE ARTHROSCOPY  04/24/2011   Procedure: ARTHROSCOPY KNEE;  Surgeon: 04/26/2011;  Location: [facility];  Service: Orthopedics;  Laterality: Right;  /Arthroscopy with debridement, right knee   LAPAROSCOPIC APPENDECTOMY  03/21/2012   Procedure: APPENDECTOMY LAPAROSCOPIC;  Surgeon: 03/23/2012. Cornett, MD;  Location: [facility];  Service: General;  Laterality: N/A;   URETHRAL STRICTURE DILATATION     age 67   Patient Active Problem List   Diagnosis Date Noted   Genetic testing 02/16/2022   Obesity (BMI 35.0-39.9 without comorbidity) 09/21/2021   Iron deficiency anemia 04/11/2021   Abdominal pain 03/31/2021   Traumatic ecchymosis of right lower leg 03/23/2020   Hematoma of right lower extremity 03/23/2020   Nausea 08/21/2019   Abdominal bloating 08/21/2019   History of COVID-19 06/24/2019   URI (upper respiratory infection) 08/26/2018   Thyroid nodule 02/27/2018   Intractable  migraine with aura with status migrainosus 05/14/2017   Stress 01/10/2017   DUB (dysfunctional uterine bleeding) 07/16/2016   Low back pain 04/30/2015   Fatigue 02/08/2015   Skin lesion of breast 08/06/2014   Health care maintenance 08/06/2014   Nephrolithiasis 02/11/2013   Migraine 02/11/2013   History of frequent urinary tract infections 02/11/2013   Syncope 02/11/2013   IBS (irritable bowel syndrome) 02/11/2013   Anemia 02/11/2013   Vitamin D deficiency 02/11/2013    REFERRING DIAG: 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 It has been getting progressively worse since then. She feels the pain when she sits for awhile or walks for a while. She exercises by doing dance videos and using walking pads.   PRECAUTIONS: None   SUBJECTIVE:                                                                                                                                                                                      SUBJECTIVE STATEMENT:  She reports the forward flexion stretch is especially painful.    PAIN:  Are you having pain? Yes: NPRS scale: 4/10 Pain location: Left SIJ  Pain description: Achy  Aggravating factors: Walking or standing for long distances  Relieving factors: Sitting    OBJECTIVE:    VITALS: BP 140/102 SpO2 100 HR 83    DIAGNOSTIC FINDINGS:  CLINICAL DATA:  Patient having lithotripsy this morning.   EXAM: ABDOMEN - 1 VIEW   COMPARISON:  March 31, 2021 June 25, 2021   FINDINGS: There is a 7 mm stone in the right mid kidney and a 3.5 mm stone in the lower right kidney. No other renal stones identified. There is a stable phleboliths in the deep left pelvis. A sclerotic rounded region projects over the left side of the sacrum measuring 4 mm, unchanged since November 2022 and June 25, 2021 but new since 2014.   IMPRESSION: 1. Two stones in the right kidney as above. 2. The 4 mm sclerotic focus projected over the left side of the sacrum is unchanged since November 2022 but new since 2014. This may represent a bone island but is nonspecific. This finding is more medially located than expected for a ureteral stone. 3. Phlebolith in the deep left pelvis.     Electronically Signed   By: Gerome Sam III M.D.   On: 07/07/2021 10:34   PATIENT SURVEYS:  FOTO 61/100  SCREENING FOR RED FLAGS: Bowel or bladder incontinence: No Spinal tumors: No Cauda equina syndrome: No Compression fracture: No Abdominal aneurysm: No   COGNITION: Overall cognitive status: Within functional limits for tasks assessed                          SENSATION: WFL   MUSCLE LENGTH: Hamstrings: Right 90 deg; Left 90 deg Thomas test: Negative Bilateral  Ely's test: Negative Bilateral    POSTURE: decreased lumbar lordosis   PALPATION: Left PSIS    LUMBAR ROM:    AROM eval  Flexion 100%  Extension 100%  Right lateral flexion 100%  Left lateral flexion 100%  Right rotation 100%  Left rotation 100%   (Blank rows = not tested)   LOWER EXTREMITY ROM:          Active  Right 04/01/2022 Left 04/01/2022  Hip flexion 120 120  Hip extension 30 30  Hip abduction 45 45  Hip adduction 30 30  Hip internal rotation 45 45  Hip external rotation 45 45  Knee flexion 135 135  Knee extension 0 0  Ankle dorsiflexion 20 20  Ankle plantarflexion 50 50  Ankle inversion 35 35  Ankle eversion 15 15   (Blank rows = not tested)          LOWER EXTREMITY MMT:         MMT Right eval Left eval  Hip flexion 5 4+  Hip extension 5 5  Hip abduction 4+ 4+  Hip adduction 4- 4-  Hip internal rotation      Hip external rotation      Knee flexion 5 5  Knee extension 5 5  Ankle dorsiflexion 5 5  Ankle plantarflexion      Ankle inversion      Ankle eversion       (Blank rows = not tested)     LUMBAR SPECIAL TESTS:  SI Compression/distraction test: Negative, FABER test: Negative, Gaenslen's test: Negative, Trendelenburg sign: Negative, and Thomas test: Negative,    FUNCTIONAL TESTS:  Single leg stance: NT    GAIT: Distance walked: 50 ft  Assistive device utilized: None Level of assistance: Complete Independence Comments: No gait deficits noted    TODAY'S TREATMENT:                                                                                                                               DATE:   04/08/22 Recumbent Bicycle seat at 7 with resistance of 2 for 5 min  Standing Hip Adductor Stretch 2 x 30 sec  Supine Single Knee to Chest with 3 sec  Supine Butterfly Groin Stretch 30 sec  Supine Abdominal Crunches 3 x 10  Standing Hip Adduction with Slider 1 x 10  -Pt reports that this is too easy Standing Hip Adduction with green band resistance on LLE 1 x 10  Standing Lateral Lunge with #15 DB  3 x 10   Single Leg Stance on RLE for 10 sec: No right hip drop noted   04/01/22   Lower Trunk Rotation 3 x 10 Forward flexion with butterfly stretch 3 x 30 sec        PATIENT EDUCATION:  Education details: form and technique for appropriate exercise and explanation of deficits and SIJ PT diagnosis  Person educated: Patient Education method: Explanation, Demonstration, Verbal cues, and Handouts Education comprehension: verbalized understanding, returned demonstration, and verbal cues required   HOME EXERCISE PROGRAM: Access Code: [email or web address] URL: [email or web address] Date: 04/08/2022 Prepared by: Ellin Goodieaniel Solita Macadam  Exercises - Supine Lower Trunk Rotation  - 7 x weekly - 3 sets - 10 reps - Supine Single Knee to Chest Stretch  - 1 x daily - 3 sets - 10 reps - 3 sec  hold - Lateral Lunge Adductor Stretch with Counter Support  - 1 x daily - 3 reps - 30 sec hold - Curl Up with Arms Crossed  - 3 x weekly - 3 sets - 10 reps -Lateral Lunges with #15 DB 3 x weekly- 3 sets- 10 reps    ASSESSMENT:   CLINICAL IMPRESSION:  Pt presents for f/u for left SIJ pain. She was able to perform all strength exercises without an increase in her pain. HEP modified to include standing hip adduction and single knee to chest to avoid exacerbating pain with forward flexion with butterfly. She will continue to benefit from skilled PT to reduce left SIJ joint pain and to increase hip strength to stand and walk for prolonged periods of time to carry out tasks of her job.    OBJECTIVE IMPAIRMENTS: decreased mobility, decreased strength and pain.    ACTIVITY LIMITATIONS: carrying, bending, squatting, transfers, and locomotion level   PARTICIPATION LIMITATIONS: occupation   PERSONAL FACTORS: Past/current experiences, Time since onset of injury/illness/exacerbation, and 1-2 comorbidities: occipital neuralgia and h/o brother with ankylosing spondylosis are also affecting patient's functional outcome.    REHAB POTENTIAL: Good   CLINICAL DECISION MAKING: Stable/uncomplicated   EVALUATION COMPLEXITY: Low     GOALS: Goals reviewed with patient? No   SHORT TERM GOALS: Target date: 04/15/2022   Pt will be independent with HEP in order to improve strength and balance in order to decrease fall risk and improve function at home and work. Baseline: NT  Goal status: Ongoing    LONG TERM GOALS: Target date: 06/10/2022   Patient will have improved function and activity level as evidenced by an increase in FOTO score by 10 points or more.  Baseline: 60/100 with target of 71  Goal status: Ongoing    2.  Patient will improve hip strength by 1/3 MMT grade for improved spinal stability and reduction of hip pain.  Baseline: Hip Add R/L 4-/4- Hip Flex L 4+, Hip Abd R/L 4+/4+ Goal status: Ongoing      PLAN:   PT FREQUENCY: 1-2x/week   PT DURATION: 10 weeks   PLANNED INTERVENTIONS: Therapeutic exercises, Gait training, Joint mobilization, Joint manipulation, Electrical stimulation, Spinal manipulation, Spinal mobilization, Cryotherapy, Moist heat, Manual therapy, and Re-evaluation.   PLAN FOR NEXT SESSION:  Gait analysis, Hip IR and ER MMT. Progress hip strengthening exercises.   Ellin Goodieaniel Rosabella Edgin PT, DPT  04/08/2022, 4:41 PM

## 2022-04-13 ENCOUNTER — Encounter: Payer: BC Managed Care – PPO | Admitting: Physical Therapy

## 2022-04-15 ENCOUNTER — Encounter: Payer: BC Managed Care – PPO | Admitting: Physical Therapy

## 2022-04-15 ENCOUNTER — Ambulatory Visit: Payer: BC Managed Care – PPO | Admitting: Physical Therapy

## 2022-04-15 DIAGNOSIS — G8929 Other chronic pain: Secondary | ICD-10-CM | POA: Diagnosis not present

## 2022-04-15 DIAGNOSIS — M533 Sacrococcygeal disorders, not elsewhere classified: Secondary | ICD-10-CM | POA: Diagnosis not present

## 2022-04-15 DIAGNOSIS — M545 Low back pain, unspecified: Secondary | ICD-10-CM | POA: Diagnosis not present

## 2022-04-15 DIAGNOSIS — M5459 Other low back pain: Secondary | ICD-10-CM | POA: Diagnosis not present

## 2022-04-15 NOTE — Therapy (Signed)
OUTPATIENT PHYSICAL THERAPY TREATMENT NOTE   Patient Name: Tina Fleming MRN: [id number] DOB:1981-01-12, 41 y.o., female Today's Date: 04/15/2022  PCP: Dr. Dale [place]  REFERRING PROVIDER: Dr. Dale [place]   END OF SESSION:   PT End of Session - 04/15/22 1629     Visit Number 3    Number of Visits 20    Date for PT Re-Evaluation 06/10/22    Authorization Type BCBS    Authorization Time Period 04/01/22-06/10/2022    Authorization - Visit Number 3    Authorization - Number of Visits 20    Progress Note Due on Visit 10    PT Start Time 1545    PT Stop Time 1630    PT Time Calculation (min) 45 min    Activity Tolerance Patient tolerated treatment well    Behavior During Therapy [facility] for tasks assessed/performed              Past Medical History:  Diagnosis Date   Bilateral occipital neuralgia    Complication of anesthesia    hard to wake due to  prolonged sedation   DUB (dysfunctional uterine bleeding)    Heart murmur    per pt asymtomatic, dx age 70 and told had normal echo   History of 2019 novel coronavirus disease (COVID-19) 05/2018   per pt mild symptoms that resolved   History of kidney stones    History of urethral stricture    age 53 s/p dilatation   IBS (irritable bowel syndrome)    IDA (iron deficiency anemia)    Intractable migraine with aura without status migrainosus    neruologist--- dr h. Sherryll Burger   Thyroid nodule    per pt had biopsy of nodule approx. 2019 or 2020 , benign , and no follow up needed   Vitamin D deficiency    Past Surgical History:  Procedure Laterality Date   DILATATION & CURETTAGE/HYSTEROSCOPY WITH MYOSURE N/A 03/20/2016   Procedure: DILATATION & CURETTAGE/HYSTEROSCOPY WITH MYOSURE;  Surgeon: Maxie Better, MD;  Location: [facility];  Service: Gynecology;  Laterality: N/A;  45 min. requested   DILATATION & CURETTAGE/HYSTEROSCOPY WITH MYOSURE N/A 12/06/2020   Procedure: DILATATION & CURETTAGE/DIAGNOSTIC HYSTEROSCOPY/  HYSTEROSCOPIC RESECTION OF ENDOMETRIAL POLYP USING MYOSURE;  Surgeon: Maxie Better, MD;  Location: [facility] [place];  Service: Gynecology;  Laterality: N/A;   EXTRACORPOREAL SHOCK WAVE LITHOTRIPSY  04/18/2013   @[facility]    EXTRACORPOREAL SHOCK WAVE LITHOTRIPSY Right 07/07/2021   Procedure: RIGHT EXTRACORPOREAL SHOCK WAVE LITHOTRIPSY (ESWL);  Surgeon: 07/09/2021, MD;  Location: [facility];  Service: Urology;  Laterality: Right;   KNEE ARTHROSCOPY  04/24/2011   Procedure: ARTHROSCOPY KNEE;  Surgeon: 04/26/2011;  Location: [facility];  Service: Orthopedics;  Laterality: Right;  /Arthroscopy with debridement, right knee   LAPAROSCOPIC APPENDECTOMY  03/21/2012   Procedure: APPENDECTOMY LAPAROSCOPIC;  Surgeon: 03/23/2012. Cornett, MD;  Location: [facility];  Service: General;  Laterality: N/A;   URETHRAL STRICTURE DILATATION     age 2   Patient Active Problem List   Diagnosis Date Noted   Genetic testing 02/16/2022   Obesity (BMI 35.0-39.9 without comorbidity) 09/21/2021   Iron deficiency anemia 04/11/2021   Abdominal pain 03/31/2021   Traumatic ecchymosis of right lower leg 03/23/2020   Hematoma of right lower extremity 03/23/2020   Nausea 08/21/2019   Abdominal bloating 08/21/2019   History of COVID-19 06/24/2019   URI (upper respiratory infection) 08/26/2018   Thyroid nodule 02/27/2018  Intractable migraine with aura with status migrainosus 05/14/2017   Stress 01/10/2017   DUB (dysfunctional uterine bleeding) 07/16/2016   Low back pain 04/30/2015   Fatigue 02/08/2015   Skin lesion of breast 08/06/2014   Health care maintenance 08/06/2014   Nephrolithiasis 02/11/2013   Migraine 02/11/2013   History of frequent urinary tract infections 02/11/2013   Syncope 02/11/2013   IBS (irritable bowel syndrome) 02/11/2013   Anemia 02/11/2013   Vitamin D deficiency 02/11/2013    REFERRING DIAG: Chronic left SIJ joint pain and chronic low back  pain   THERAPY DIAG:  Other low back pain  Chronic left SI joint pain  Rationale for Evaluation and Treatment Rehabilitation  PERTINENT HISTORY: Pt reports that she noticed her back was locking up when she stood up. It has been getting progressively worse since then. She feels the pain when she sits for awhile or walks for a while. She exercises by doing dance videos and using walking pads.   PRECAUTIONS: None   SUBJECTIVE:                                                                                                                                                                                      SUBJECTIVE STATEMENT:  Pt reports that she was able to do all exercises without difficulty. She did feel pain when performing the lunges. She reports that she has a history of right knee pain from prior scope of knee.    PAIN:  Are you having pain? Yes: NPRS scale: 3/10 Pain location: Left SIJ  Pain description: Achy  Aggravating factors: Walking or standing for long distances  Relieving factors: Sitting    OBJECTIVE:    VITALS: BP 140/102 SpO2 100 HR 83    DIAGNOSTIC FINDINGS:  CLINICAL DATA:  Patient having lithotripsy this morning.   EXAM: ABDOMEN - 1 VIEW   COMPARISON:  March 31, 2021 June 25, 2021   FINDINGS: There is a 7 mm stone in the right mid kidney and a 3.5 mm stone in the lower right kidney. No other renal stones identified. There is a stable phleboliths in the deep left pelvis. A sclerotic rounded region projects over the left side of the sacrum measuring 4 mm, unchanged since November 2022 and June 25, 2021 but new since 2014.   IMPRESSION: 1. Two stones in the right kidney as above. 2. The 4 mm sclerotic focus projected over the left side of the sacrum is unchanged since November 2022 but new since 2014. This may represent a bone island but is nonspecific. This finding is more medially located than expected for a ureteral stone. 3. Phlebolith  in the  deep left pelvis.     Electronically Signed   By: Gerome Sam III M.D.   On: 07/07/2021 10:34   PATIENT SURVEYS:  FOTO 61/100   SCREENING FOR RED FLAGS: Bowel or bladder incontinence: No Spinal tumors: No Cauda equina syndrome: No Compression fracture: No Abdominal aneurysm: No   COGNITION: Overall cognitive status: Within functional limits for tasks assessed                          SENSATION: WFL   MUSCLE LENGTH: Hamstrings: Right 90 deg; Left 90 deg Thomas test: Negative Bilateral  Ely's test: Negative Bilateral    POSTURE: decreased lumbar lordosis   PALPATION: Left PSIS    LUMBAR ROM:    AROM eval  Flexion 100%  Extension 100%  Right lateral flexion 100%  Left lateral flexion 100%  Right rotation 100%  Left rotation 100%   (Blank rows = not tested)   LOWER EXTREMITY ROM:          Active  Right 04/01/2022 Left 04/01/2022  Hip flexion 120 120  Hip extension 30 30  Hip abduction 45 45  Hip adduction 30 30  Hip internal rotation 45 45  Hip external rotation 45 45  Knee flexion 135 135  Knee extension 0 0  Ankle dorsiflexion 20 20  Ankle plantarflexion 50 50  Ankle inversion 35 35  Ankle eversion 15 15   (Blank rows = not tested)          LOWER EXTREMITY MMT:         MMT Right eval Left eval  Hip flexion 5 4+  Hip extension 5 5  Hip abduction 4+ 4+  Hip adduction 4- 4-  Hip internal rotation      Hip external rotation      Knee flexion 5 5  Knee extension 5 5  Ankle dorsiflexion 5 5  Ankle plantarflexion      Ankle inversion      Ankle eversion       (Blank rows = not tested)     LUMBAR SPECIAL TESTS:  SI Compression/distraction test: Negative, FABER test: Negative, Gaenslen's test: Negative, Trendelenburg sign: Negative, and Thomas test: Negative,    FUNCTIONAL TESTS:  Single leg stance: NT    GAIT: Distance walked: 50 ft  Assistive device utilized: None Level of assistance: Complete Independence Comments:  No gait deficits noted    TODAY'S TREATMENT:                                                                                                                              DATE:   04/15/22 Recumbent Bicycle seat at 7 with resistance of 2 for 5 min  Standing Hip Adductor stretch with forward lean onto mat for increased BUE support 2 x 30 sec  -Pt reports feeling less pain in right knee  Seated Butterfly Stretch 3 x 30 sec  Supine  Butterfly Stretch 2 x 30 sec  Standing Hip Abduction with 1 UE support 1 x 10  Standing Hip Abduction with 1 UE support and yellow band 2 x 10  -min VC to maintain upright posture  Side Lying Hip Abduction with yellow band (shown as alternative if right knee is paining her) 2 x 10  Mini-Squat 1 x 10  Mini-Squat with 10 KB hold 2 x 10  -with 20 inch mat height   04/08/22 Recumbent Bicycle seat at 7 with resistance of 2 for 5 min  Standing Hip Adductor Stretch 2 x 30 sec  Supine Single Knee to Chest with 3 sec  Supine Butterfly Groin Stretch 30 sec  Supine Abdominal Crunches 3 x 10  Standing Hip Adduction with Slider 1 x 10  -Pt reports that this is too easy Standing Hip Adduction with green band resistance on LLE 1 x 10  Standing Lateral Lunge with #15 DB 3 x 10   Single Leg Stance on RLE for 10 sec: No right hip drop noted   04/01/22   Lower Trunk Rotation 3 x 10 Forward flexion with butterfly stretch 3 x 30 sec        PATIENT EDUCATION:  Education details: form and technique for appropriate exercise and explanation of deficits and SIJ PT diagnosis  Person educated: Patient Education method: Programmer, multimediaxplanation, Demonstration, Verbal cues, and Handouts Education comprehension: verbalized understanding, returned demonstration, and verbal cues required   HOME EXERCISE PROGRAM: Access Code: [email or web address] URL: [email or web address] Date: 04/15/2022 Prepared by: Ellin Goodieaniel Lavarius Doughten  Exercises - Supine Lower Trunk Rotation  - 7 x weekly - 3 sets - 10  reps - Supine Single Knee to Chest Stretch  - 1 x daily - 3 sets - 10 reps - 3 sec  hold - Curl Up with Arms Crossed  - 3 x weekly - 3 sets - 10 reps - Supine Butterfly Groin Stretch  - 1 x daily - 3 reps - 30 sec  hold - Standing Hip Abduction  - 3 x weekly - 3 sets - 10 reps - Sidelying Hip Abduction with Resistance at Ankle  - 3 x weekly - 3 sets - 10 reps - Mini Squat  - 3 x weekly - 3 sets - 10 reps   ASSESSMENT:   CLINICAL IMPRESSION:   Pt able to perform all exercises without an increase in her pain with exercises modified to decrease load on right knee. She did not decreased pain at the beginning of session, but this likely because she was off of her feet most of the day. She will continue to benefit from skilled PT to reduce left SIJ joint pain and to increase hip strength to stand and walk for prolonged periods of time to carry out tasks of her job.   OBJECTIVE IMPAIRMENTS: decreased mobility, decreased strength and pain.    ACTIVITY LIMITATIONS: carrying, bending, squatting, transfers, and locomotion level   PARTICIPATION LIMITATIONS: occupation   PERSONAL FACTORS: Past/current experiences, Time since onset of injury/illness/exacerbation, and 1-2 comorbidities: occipital neuralgia and h/o brother with ankylosing spondylosis are also affecting patient's functional outcome.    REHAB POTENTIAL: Good   CLINICAL DECISION MAKING: Stable/uncomplicated   EVALUATION COMPLEXITY: Low     GOALS: Goals reviewed with patient? No   SHORT TERM GOALS: Target date: 04/15/2022   Pt will be independent with HEP in order to improve strength and balance in order to decrease fall risk and improve function at home and work. Baseline: NT  Goal status:  Ongoing    LONG TERM GOALS: Target date: 06/10/2022   Patient will have improved function and activity level as evidenced by an increase in FOTO score by 10 points or more.  Baseline: 60/100 with target of 71  Goal status: Ongoing    2.   Patient will improve hip strength by 1/3 MMT grade for improved spinal stability and reduction of hip pain.  Baseline: Hip Add R/L 4-/4- Hip Flex L 4+, Hip Abd R/L 4+/4+ Goal status: Ongoing      PLAN:   PT FREQUENCY: 1-2x/week   PT DURATION: 10 weeks   PLANNED INTERVENTIONS: Therapeutic exercises, Gait training, Joint mobilization, Joint manipulation, Electrical stimulation, Spinal manipulation, Spinal mobilization, Cryotherapy, Moist heat, Manual therapy, and Re-evaluation.   PLAN FOR NEXT SESSION:  Gait analysis, Hip IR and ER MMT. Progress hip strengthening exercises: monster walks, side steps, and resisted forward walks.   Ellin Goodie PT, DPT  04/15/2022, 4:30 PM

## 2022-04-20 ENCOUNTER — Encounter: Payer: BC Managed Care – PPO | Admitting: Physical Therapy

## 2022-04-22 ENCOUNTER — Ambulatory Visit: Payer: BC Managed Care – PPO

## 2022-04-22 ENCOUNTER — Encounter: Payer: Self-pay | Admitting: Physical Therapy

## 2022-04-22 DIAGNOSIS — M5459 Other low back pain: Secondary | ICD-10-CM | POA: Diagnosis not present

## 2022-04-22 DIAGNOSIS — M545 Low back pain, unspecified: Secondary | ICD-10-CM | POA: Diagnosis not present

## 2022-04-22 DIAGNOSIS — G8929 Other chronic pain: Secondary | ICD-10-CM | POA: Diagnosis not present

## 2022-04-22 DIAGNOSIS — M533 Sacrococcygeal disorders, not elsewhere classified: Secondary | ICD-10-CM | POA: Diagnosis not present

## 2022-04-22 NOTE — Therapy (Signed)
OUTPATIENT PHYSICAL THERAPY TREATMENT NOTE   Patient Name: Tina Fleming MRN: [id number] DOB:November 16, 1980, 41 y.o., female Today's Date: 04/22/2022  PCP: Dr. Dale [place]  REFERRING PROVIDER: Dr. Dale [place]   END OF SESSION:   PT End of Session - 04/22/22 1551     Visit Number 4    Number of Visits 20    Date for PT Re-Evaluation 06/10/22    Authorization Type BCBS    Authorization Time Period 04/01/22-06/10/2022    Authorization - Visit Number 4    Authorization - Number of Visits 20    Progress Note Due on Visit 10    PT Start Time 1547    PT Stop Time 1630    PT Time Calculation (min) 43 min    Activity Tolerance Patient tolerated treatment well    Behavior During Therapy [facility] for tasks assessed/performed              Past Medical History:  Diagnosis Date   Bilateral occipital neuralgia    Complication of anesthesia    hard to wake due to  prolonged sedation   DUB (dysfunctional uterine bleeding)    Heart murmur    per pt asymtomatic, dx age 41 and told had normal echo   History of 2019 novel coronavirus disease (COVID-19) 05/2018   per pt mild symptoms that resolved   History of kidney stones    History of urethral stricture    age 61 s/p dilatation   IBS (irritable bowel syndrome)    IDA (iron deficiency anemia)    Intractable migraine with aura without status migrainosus    neruologist--- dr h. Sherryll Burger   Thyroid nodule    per pt had biopsy of nodule approx. 2019 or 2020 , benign , and no follow up needed   Vitamin D deficiency    Past Surgical History:  Procedure Laterality Date   DILATATION & CURETTAGE/HYSTEROSCOPY WITH MYOSURE N/A 03/20/2016   Procedure: DILATATION & CURETTAGE/HYSTEROSCOPY WITH MYOSURE;  Surgeon: Maxie Better, MD;  Location: [facility];  Service: Gynecology;  Laterality: N/A;  45 min. requested   DILATATION & CURETTAGE/HYSTEROSCOPY WITH MYOSURE N/A 12/06/2020   Procedure: DILATATION & CURETTAGE/DIAGNOSTIC HYSTEROSCOPY/  HYSTEROSCOPIC RESECTION OF ENDOMETRIAL POLYP USING MYOSURE;  Surgeon: Maxie Better, MD;  Location: [facility];  Service: Gynecology;  Laterality: N/A;   EXTRACORPOREAL SHOCK WAVE LITHOTRIPSY  04/18/2013   @[facility]    EXTRACORPOREAL SHOCK WAVE LITHOTRIPSY Right 07/07/2021   Procedure: RIGHT EXTRACORPOREAL SHOCK WAVE LITHOTRIPSY (ESWL);  Surgeon: 07/09/2021, MD;  Location: [facility];  Service: Urology;  Laterality: Right;   KNEE ARTHROSCOPY  04/24/2011   Procedure: ARTHROSCOPY KNEE;  Surgeon: 04/26/2011;  Location: [facility];  Service: Orthopedics;  Laterality: Right;  /Arthroscopy with debridement, right knee   LAPAROSCOPIC APPENDECTOMY  03/21/2012   Procedure: APPENDECTOMY LAPAROSCOPIC;  Surgeon: 03/23/2012. Cornett, MD;  Location: [facility];  Service: General;  Laterality: N/A;   URETHRAL STRICTURE DILATATION     age 89   Patient Active Problem List   Diagnosis Date Noted   Genetic testing 02/16/2022   Obesity (BMI 35.0-39.9 without comorbidity) 09/21/2021   Iron deficiency anemia 04/11/2021   Abdominal pain 03/31/2021   Traumatic ecchymosis of right lower leg 03/23/2020   Hematoma of right lower extremity 03/23/2020   Nausea 08/21/2019   Abdominal bloating 08/21/2019   History of COVID-19 06/24/2019   URI (upper respiratory infection) 08/26/2018   Thyroid nodule 02/27/2018  Intractable migraine with aura with status migrainosus 05/14/2017   Stress 01/10/2017   DUB (dysfunctional uterine bleeding) 07/16/2016   Low back pain 04/30/2015   Fatigue 02/08/2015   Skin lesion of breast 08/06/2014   Health care maintenance 08/06/2014   Nephrolithiasis 02/11/2013   Migraine 02/11/2013   History of frequent urinary tract infections 02/11/2013   Syncope 02/11/2013   IBS (irritable bowel syndrome) 02/11/2013   Anemia 02/11/2013   Vitamin D deficiency 02/11/2013    REFERRING DIAG: Chronic left SIJ joint pain and chronic low back  pain   THERAPY DIAG:  Other low back pain  Chronic left SI joint pain  Rationale for Evaluation and Treatment Rehabilitation  PERTINENT HISTORY: Pt reports that she noticed her back was locking up when she stood up. It has been getting progressively worse since then. She feels the pain when she sits for awhile or walks for a while. She exercises by doing dance videos and using walking pads.   PRECAUTIONS: None   SUBJECTIVE:                                                                                                                                                                                      SUBJECTIVE STATEMENT: Pt reports no pain today. Did not work today so not sitting all day. Been compliant with HEP.  PAIN:  Are you having pain? Yes: NPRS scale: 3/10 Pain location: Left SIJ  Pain description: Achy  Aggravating factors: Walking or standing for long distances  Relieving factors: Sitting    OBJECTIVE:    VITALS: BP 140/102 SpO2 100 HR 83    DIAGNOSTIC FINDINGS:  CLINICAL DATA:  Patient having lithotripsy this morning.   EXAM: ABDOMEN - 1 VIEW   COMPARISON:  March 31, 2021 June 25, 2021   FINDINGS: There is a 7 mm stone in the right mid kidney and a 3.5 mm stone in the lower right kidney. No other renal stones identified. There is a stable phleboliths in the deep left pelvis. A sclerotic rounded region projects over the left side of the sacrum measuring 4 mm, unchanged since November 2022 and June 25, 2021 but new since 2014.   IMPRESSION: 1. Two stones in the right kidney as above. 2. The 4 mm sclerotic focus projected over the left side of the sacrum is unchanged since November 2022 but new since 2014. This may represent a bone island but is nonspecific. This finding is more medially located than expected for a ureteral stone. 3. Phlebolith in the deep left pelvis.     Electronically Signed   By: Gerome Sam III M.D.   On: 07/07/2021  10:34   PATIENT SURVEYS:  FOTO 61/100   SCREENING FOR RED FLAGS: Bowel or bladder incontinence: No Spinal tumors: No Cauda equina syndrome: No Compression fracture: No Abdominal aneurysm: No   COGNITION: Overall cognitive status: Within functional limits for tasks assessed                          SENSATION: WFL   MUSCLE LENGTH: Hamstrings: Right 90 deg; Left 90 deg Thomas test: Negative Bilateral  Ely's test: Negative Bilateral    POSTURE: decreased lumbar lordosis   PALPATION: Left PSIS    LUMBAR ROM:    AROM eval  Flexion 100%  Extension 100%  Right lateral flexion 100%  Left lateral flexion 100%  Right rotation 100%  Left rotation 100%   (Blank rows = not tested)   LOWER EXTREMITY ROM:          Active  Right 04/01/2022 Left 04/01/2022  Hip flexion 120 120  Hip extension 30 30  Hip abduction 45 45  Hip adduction 30 30  Hip internal rotation 45 45  Hip external rotation 45 45  Knee flexion 135 135  Knee extension 0 0  Ankle dorsiflexion 20 20  Ankle plantarflexion 50 50  Ankle inversion 35 35  Ankle eversion 15 15   (Blank rows = not tested)          LOWER EXTREMITY MMT:         MMT Right eval Left eval  Hip flexion 5 4+  Hip extension 5 5  Hip abduction 4+ 4+  Hip adduction 4- 4-  Hip internal rotation      Hip external rotation      Knee flexion 5 5  Knee extension 5 5  Ankle dorsiflexion 5 5  Ankle plantarflexion      Ankle inversion      Ankle eversion       (Blank rows = not tested)     LUMBAR SPECIAL TESTS:  SI Compression/distraction test: Negative, FABER test: Negative, Gaenslen's test: Negative, Trendelenburg sign: Negative, and Thomas test: Negative,    FUNCTIONAL TESTS:  Single leg stance: NT    GAIT: Distance walked: 50 ft  Assistive device utilized: None Level of assistance: Complete Independence Comments: No gait deficits noted    TODAY'S TREATMENT:                                                                                                                               DATE:   04/22/22 There.ex: Recumbent Bicycle seat at 7 with resistance of 2 for 5 min   Standing hip abduction with SUE support with 3# AW's: 3x8. Min multimodal cuing for upright posture with fair carryover.    Standing hip extension with BUE support with 3# AW's: 3x8  Red physioball exercise:   Glut bridging: 3x8   Hamstring curl + bridge: 3x8   Resisted monster walks with [employer]Blue  TB: 4 x 10 meters    PATIENT EDUCATION:  Education details: form and technique for appropriate exercise and explanation of deficits and SIJ PT diagnosis  Person educated: Patient Education method: Explanation, Demonstration, Verbal cues, and Handouts Education comprehension: verbalized understanding, returned demonstration, and verbal cues required   HOME EXERCISE PROGRAM: Access Code: [email or web address] URL: [email or web address] Date: 04/15/2022 Prepared by: Ellin Goodie  Exercises - Supine Lower Trunk Rotation  - 7 x weekly - 3 sets - 10 reps - Supine Single Knee to Chest Stretch  - 1 x daily - 3 sets - 10 reps - 3 sec  hold - Curl Up with Arms Crossed  - 3 x weekly - 3 sets - 10 reps - Supine Butterfly Groin Stretch  - 1 x daily - 3 reps - 30 sec  hold - Standing Hip Abduction  - 3 x weekly - 3 sets - 10 reps - Sidelying Hip Abduction with Resistance at Ankle  - 3 x weekly - 3 sets - 10 reps - Mini Squat  - 3 x weekly - 3 sets - 10 reps   ASSESSMENT:   CLINICAL IMPRESSION:   Continuing PT POC with focus on hip and core stability. No exacerbation of symptoms with exercise. Pt requiring min to mod VC's for form/technique with fair carryover. Pt will continue to benefit from skilled PT to reduce left SIJ joint pain and to increase hip strength to stand and walk for prolonged periods of time to carry out tasks of her job.    OBJECTIVE IMPAIRMENTS: decreased mobility, decreased strength and pain.    ACTIVITY LIMITATIONS:  carrying, bending, squatting, transfers, and locomotion level   PARTICIPATION LIMITATIONS: occupation   PERSONAL FACTORS: Past/current experiences, Time since onset of injury/illness/exacerbation, and 1-2 comorbidities: occipital neuralgia and h/o brother with ankylosing spondylosis are also affecting patient's functional outcome.    REHAB POTENTIAL: Good   CLINICAL DECISION MAKING: Stable/uncomplicated   EVALUATION COMPLEXITY: Low     GOALS: Goals reviewed with patient? No   SHORT TERM GOALS: Target date: 04/15/2022   Pt will be independent with HEP in order to improve strength and balance in order to decrease fall risk and improve function at home and work. Baseline: NT  Goal status: Ongoing    LONG TERM GOALS: Target date: 06/10/2022   Patient will have improved function and activity level as evidenced by an increase in FOTO score by 10 points or more.  Baseline: 60/100 with target of 71  Goal status: Ongoing    2.  Patient will improve hip strength by 1/3 MMT grade for improved spinal stability and reduction of hip pain.  Baseline: Hip Add R/L 4-/4- Hip Flex L 4+, Hip Abd R/L 4+/4+ Goal status: Ongoing      PLAN:   PT FREQUENCY: 1-2x/week   PT DURATION: 10 weeks   PLANNED INTERVENTIONS: Therapeutic exercises, Gait training, Joint mobilization, Joint manipulation, Electrical stimulation, Spinal manipulation, Spinal mobilization, Cryotherapy, Moist heat, Manual therapy, and Re-evaluation.   PLAN FOR NEXT SESSION:  Gait analysis, Hip IR and ER MMT. Progress hip strengthening exercises: monster walks, side steps, and resisted forward walks.   Delphia Grates. Fairly IV, PT, DPT Physical Therapist- [place]  [facility]  04/22/2022, 4:53 PM

## 2022-04-27 ENCOUNTER — Encounter: Payer: BC Managed Care – PPO | Admitting: Physical Therapy

## 2022-04-29 ENCOUNTER — Ambulatory Visit: Payer: BC Managed Care – PPO | Attending: Internal Medicine

## 2022-04-29 ENCOUNTER — Encounter: Payer: Self-pay | Admitting: Physical Therapy

## 2022-04-29 DIAGNOSIS — G8929 Other chronic pain: Secondary | ICD-10-CM | POA: Diagnosis not present

## 2022-04-29 DIAGNOSIS — M533 Sacrococcygeal disorders, not elsewhere classified: Secondary | ICD-10-CM | POA: Diagnosis not present

## 2022-04-29 DIAGNOSIS — M5459 Other low back pain: Secondary | ICD-10-CM | POA: Insufficient documentation

## 2022-04-29 NOTE — Therapy (Signed)
OUTPATIENT PHYSICAL THERAPY TREATMENT NOTE   Patient Name: Tina Fleming MRN: [id number] DOB:Jul 17, 1980, 41 y.o., female Today's Date: 04/29/2022  PCP: Dr. Dale [place]  REFERRING PROVIDER: Dr. Dale [place]   END OF SESSION:   PT End of Session - 04/29/22 1503     Visit Number 5    Number of Visits 20    Date for PT Re-Evaluation 06/10/22    Authorization Type BCBS    Authorization Time Period 04/01/22-06/10/2022    Authorization - Visit Number 5    Authorization - Number of Visits 20    Progress Note Due on Visit 10    PT Start Time 1501    PT Stop Time 1543    PT Time Calculation (min) 42 min    Activity Tolerance Patient tolerated treatment well    Behavior During Therapy [facility] for tasks assessed/performed              Past Medical History:  Diagnosis Date   Bilateral occipital neuralgia    Complication of anesthesia    hard to wake due to  prolonged sedation   DUB (dysfunctional uterine bleeding)    Heart murmur    per pt asymtomatic, dx age 12 and told had normal echo   History of 2019 novel coronavirus disease (COVID-19) 05/2018   per pt mild symptoms that resolved   History of kidney stones    History of urethral stricture    age 44 s/p dilatation   IBS (irritable bowel syndrome)    IDA (iron deficiency anemia)    Intractable migraine with aura without status migrainosus    neruologist--- dr h. Sherryll Burger   Thyroid nodule    per pt had biopsy of nodule approx. 2019 or 2020 , benign , and no follow up needed   Vitamin D deficiency    Past Surgical History:  Procedure Laterality Date   DILATATION & CURETTAGE/HYSTEROSCOPY WITH MYOSURE N/A 03/20/2016   Procedure: DILATATION & CURETTAGE/HYSTEROSCOPY WITH MYOSURE;  Surgeon: Maxie Better, MD;  Location: [facility];  Service: Gynecology;  Laterality: N/A;  45 min. requested   DILATATION & CURETTAGE/HYSTEROSCOPY WITH MYOSURE N/A 12/06/2020   Procedure: DILATATION & CURETTAGE/DIAGNOSTIC HYSTEROSCOPY/  HYSTEROSCOPIC RESECTION OF ENDOMETRIAL POLYP USING MYOSURE;  Surgeon: Maxie Better, MD;  Location: [facility];  Service: Gynecology;  Laterality: N/A;   EXTRACORPOREAL SHOCK WAVE LITHOTRIPSY  04/18/2013   @[facility]    EXTRACORPOREAL SHOCK WAVE LITHOTRIPSY Right 07/07/2021   Procedure: RIGHT EXTRACORPOREAL SHOCK WAVE LITHOTRIPSY (ESWL);  Surgeon: 07/09/2021, MD;  Location: [facility];  Service: Urology;  Laterality: Right;   KNEE ARTHROSCOPY  04/24/2011   Procedure: ARTHROSCOPY KNEE;  Surgeon: 04/26/2011;  Location: [facility];  Service: Orthopedics;  Laterality: Right;  /Arthroscopy with debridement, right knee   LAPAROSCOPIC APPENDECTOMY  03/21/2012   Procedure: APPENDECTOMY LAPAROSCOPIC;  Surgeon: 03/23/2012. Cornett, MD;  Location: [facility];  Service: General;  Laterality: N/A;   URETHRAL STRICTURE DILATATION     age 89   Patient Active Problem List   Diagnosis Date Noted   Genetic testing 02/16/2022   Obesity (BMI 35.0-39.9 without comorbidity) 09/21/2021   Iron deficiency anemia 04/11/2021   Abdominal pain 03/31/2021   Traumatic ecchymosis of right lower leg 03/23/2020   Hematoma of right lower extremity 03/23/2020   Nausea 08/21/2019   Abdominal bloating 08/21/2019   History of COVID-19 06/24/2019   URI (upper respiratory infection) 08/26/2018   Thyroid nodule 02/27/2018  Intractable migraine with aura with status migrainosus 05/14/2017   Stress 01/10/2017   DUB (dysfunctional uterine bleeding) 07/16/2016   Low back pain 04/30/2015   Fatigue 02/08/2015   Skin lesion of breast 08/06/2014   Health care maintenance 08/06/2014   Nephrolithiasis 02/11/2013   Migraine 02/11/2013   History of frequent urinary tract infections 02/11/2013   Syncope 02/11/2013   IBS (irritable bowel syndrome) 02/11/2013   Anemia 02/11/2013   Vitamin D deficiency 02/11/2013    REFERRING DIAG: Chronic left SIJ joint pain and chronic low back  pain   THERAPY DIAG:  Other low back pain  Chronic left SI joint pain  Rationale for Evaluation and Treatment Rehabilitation  PERTINENT HISTORY: Pt reports that she noticed her back was locking up when she stood up. It has been getting progressively worse since then. She feels the pain when she sits for awhile or walks for a while. She exercises by doing dance videos and using walking pads.   PRECAUTIONS: None   SUBJECTIVE:                                                                                                                                                                                      SUBJECTIVE STATEMENT: Pt reports no pain today. No soreness from previous session. Has not worked much since last session so her pain has been pretty absent.  PAIN:  Are you having pain? Yes: NPRS scale: 0/10 Pain location: Left SIJ  Pain description: Achy  Aggravating factors: Walking or standing for long distances  Relieving factors: Sitting    OBJECTIVE:    VITALS: BP 140/102 SpO2 100 HR 83    DIAGNOSTIC FINDINGS:  CLINICAL DATA:  Patient having lithotripsy this morning.   EXAM: ABDOMEN - 1 VIEW   COMPARISON:  March 31, 2021 June 25, 2021   FINDINGS: There is a 7 mm stone in the right mid kidney and a 3.5 mm stone in the lower right kidney. No other renal stones identified. There is a stable phleboliths in the deep left pelvis. A sclerotic rounded region projects over the left side of the sacrum measuring 4 mm, unchanged since November 2022 and June 25, 2021 but new since 2014.   IMPRESSION: 1. Two stones in the right kidney as above. 2. The 4 mm sclerotic focus projected over the left side of the sacrum is unchanged since November 2022 but new since 2014. This may represent a bone island but is nonspecific. This finding is more medially located than expected for a ureteral stone. 3. Phlebolith in the deep left pelvis.     Electronically Signed   By:  Gerome Sam  III M.D.   On: 07/07/2021 10:34   PATIENT SURVEYS:  FOTO 61/100   SCREENING FOR RED FLAGS: Bowel or bladder incontinence: No Spinal tumors: No Cauda equina syndrome: No Compression fracture: No Abdominal aneurysm: No   COGNITION: Overall cognitive status: Within functional limits for tasks assessed                          SENSATION: WFL   MUSCLE LENGTH: Hamstrings: Right 90 deg; Left 90 deg Thomas test: Negative Bilateral  Ely's test: Negative Bilateral    POSTURE: decreased lumbar lordosis   PALPATION: Left PSIS    LUMBAR ROM:    AROM eval  Flexion 100%  Extension 100%  Right lateral flexion 100%  Left lateral flexion 100%  Right rotation 100%  Left rotation 100%   (Blank rows = not tested)   LOWER EXTREMITY ROM:          Active  Right 04/01/2022 Left 04/01/2022  Hip flexion 120 120  Hip extension 30 30  Hip abduction 45 45  Hip adduction 30 30  Hip internal rotation 45 45  Hip external rotation 45 45  Knee flexion 135 135  Knee extension 0 0  Ankle dorsiflexion 20 20  Ankle plantarflexion 50 50  Ankle inversion 35 35  Ankle eversion 15 15   (Blank rows = not tested)          LOWER EXTREMITY MMT:         MMT Right eval Left eval  Hip flexion 5 4+  Hip extension 5 5  Hip abduction 4+ 4+  Hip adduction 4- 4-  Hip internal rotation      Hip external rotation      Knee flexion 5 5  Knee extension 5 5  Ankle dorsiflexion 5 5  Ankle plantarflexion      Ankle inversion      Ankle eversion       (Blank rows = not tested)     LUMBAR SPECIAL TESTS:  SI Compression/distraction test: Negative, FABER test: Negative, Gaenslen's test: Negative, Trendelenburg sign: Negative, and Thomas test: Negative,    FUNCTIONAL TESTS:  Single leg stance: NT    GAIT: Distance walked: 50 ft  Assistive device utilized: None Level of assistance: Complete Independence Comments: No gait deficits noted    TODAY'S TREATMENT:                                                                                                                               DATE:   04/29/22 There.ex: Recumbent Bicycle seat at 7 with resistance of 2 for 5 min    Standing hip abduction with SUE support with 4# AW's: 3x8. Min multimodal cuing for upright posture with fair carryover.     Standing hip extension with BUE support with 4# AW's: 3x8  Red physioball exercise:   Glut bridging: 3x8   Hamstring curl + bridge:  3x8   Resisted monster walks with Blue TB: 4 x 10 meters  Seated physioball anti rotation paloff press: RTB, x20/side. Min to mod multimodal cuing for form/technique.       PATIENT EDUCATION:  Education details: form and technique for appropriate exercise and explanation of deficits and SIJ PT diagnosis  Person educated: Patient Education method: Explanation, Demonstration, Verbal cues, and Handouts Education comprehension: verbalized understanding, returned demonstration, and verbal cues required   HOME EXERCISE PROGRAM: Access Code: [email or web address] URL: [email or web address] Date: 04/15/2022 Prepared by: Ellin Goodieaniel Fleming  Exercises - Supine Lower Trunk Rotation  - 7 x weekly - 3 sets - 10 reps - Supine Single Knee to Chest Stretch  - 1 x daily - 3 sets - 10 reps - 3 sec  hold - Curl Up with Arms Crossed  - 3 x weekly - 3 sets - 10 reps - Supine Butterfly Groin Stretch  - 1 x daily - 3 reps - 30 sec  hold - Standing Hip Abduction  - 3 x weekly - 3 sets - 10 reps - Sidelying Hip Abduction with Resistance at Ankle  - 3 x weekly - 3 sets - 10 reps - Mini Squat  - 3 x weekly - 3 sets - 10 reps   ASSESSMENT:   CLINICAL IMPRESSION:   Pt presenting to PT with continuous improvements in pain levels. Pt reports easier work days over the last couple weeks leading to less pain. Incorporating progressive hip and core stability exercises this date. Pt requiring intermittent PT demo, and multimodal cuing for correct form/technique  for targeted musculature. Pt will continue to benefit from skilled PT to reduce left SIJ joint pain and to increase hip strength to stand and walk for prolonged periods of time to carry out tasks of her job.    OBJECTIVE IMPAIRMENTS: decreased mobility, decreased strength and pain.    ACTIVITY LIMITATIONS: carrying, bending, squatting, transfers, and locomotion level   PARTICIPATION LIMITATIONS: occupation   PERSONAL FACTORS: Past/current experiences, Time since onset of injury/illness/exacerbation, and 1-2 comorbidities: occipital neuralgia and h/o brother with ankylosing spondylosis are also affecting patient's functional outcome.    REHAB POTENTIAL: Good   CLINICAL DECISION MAKING: Stable/uncomplicated   EVALUATION COMPLEXITY: Low     GOALS: Goals reviewed with patient? No   SHORT TERM GOALS: Target date: 04/15/2022   Pt will be independent with HEP in order to improve strength and balance in order to decrease fall risk and improve function at home and work. Baseline: NT  Goal status: Ongoing    LONG TERM GOALS: Target date: 06/10/2022   Patient will have improved function and activity level as evidenced by an increase in FOTO score by 10 points or more.  Baseline: 60/100 with target of 71  Goal status: Ongoing    2.  Patient will improve hip strength by 1/3 MMT grade for improved spinal stability and reduction of hip pain.  Baseline: Hip Add R/L 4-/4- Hip Flex L 4+, Hip Abd R/L 4+/4+ Goal status: Ongoing      PLAN:   PT FREQUENCY: 1-2x/week   PT DURATION: 10 weeks   PLANNED INTERVENTIONS: Therapeutic exercises, Gait training, Joint mobilization, Joint manipulation, Electrical stimulation, Spinal manipulation, Spinal mobilization, Cryotherapy, Moist heat, Manual therapy, and Re-evaluation.   PLAN FOR NEXT SESSION:  Gait analysis, Hip IR and ER MMT. Progress hip strengthening exercises: monster walks, side steps, and resisted forward walks.   Delphia GratesMilton M. Fairly IV, PT,  DPT Physical Therapist- [place]  [facility]  04/29/2022, 3:49 PM

## 2022-05-04 ENCOUNTER — Encounter: Payer: BC Managed Care – PPO | Admitting: Physical Therapy

## 2022-05-06 ENCOUNTER — Ambulatory Visit: Payer: BC Managed Care – PPO | Admitting: Physical Therapy

## 2022-05-07 ENCOUNTER — Encounter: Payer: Self-pay | Admitting: Oncology

## 2022-05-09 ENCOUNTER — Encounter: Payer: Self-pay | Admitting: Oncology

## 2022-05-11 ENCOUNTER — Encounter: Payer: BC Managed Care – PPO | Admitting: Physical Therapy

## 2022-05-13 ENCOUNTER — Ambulatory Visit: Payer: BC Managed Care – PPO | Admitting: Physical Therapy

## 2022-05-13 DIAGNOSIS — M5459 Other low back pain: Secondary | ICD-10-CM

## 2022-05-13 DIAGNOSIS — G8929 Other chronic pain: Secondary | ICD-10-CM

## 2022-05-13 DIAGNOSIS — M533 Sacrococcygeal disorders, not elsewhere classified: Secondary | ICD-10-CM | POA: Diagnosis not present

## 2022-05-13 NOTE — Therapy (Addendum)
OUTPATIENT PHYSICAL THERAPY TREATMENT NOTE   Patient Name: Tina Fleming MRN: [id number] DOB:[birth year], 41 y.o., female Today's Date: 05/19/2022  PCP: Dr. Dale Rexford  REFERRING PROVIDER: Dr. Dale [place]   END OF SESSION:     05/13/22 1434  PT Visits / Re-Eval  Visit Number 6  Number of Visits 20  Date for PT Re-Evaluation 06/10/22  Authorization  Authorization Type BCBS  Authorization Time Period 04/01/22-06/10/2022  Authorization - Visit Number 6  Authorization - Number of Visits 20  Progress Note Due on Visit 10  PT Time Calculation  PT Start Time 1415  PT Stop Time 1500  PT Time Calculation (min) 45 min  PT - End of Session  Activity Tolerance Patient tolerated treatment well  Behavior During Therapy [facility] for tasks assessed/performed     Past Medical History:  Diagnosis Date   Bilateral occipital neuralgia    Complication of anesthesia    hard to wake due to  prolonged sedation   DUB (dysfunctional uterine bleeding)    Heart murmur    per pt asymtomatic, dx age 22 and told had normal echo   History of 2019 novel coronavirus disease (COVID-19) 05/2018   per pt mild symptoms that resolved   History of kidney stones    History of urethral stricture    age 89 s/p dilatation   IBS (irritable bowel syndrome)    IDA (iron deficiency anemia)    Intractable migraine with aura without status migrainosus    neruologist--- dr h. Sherryll Burger   Thyroid nodule    per pt had biopsy of nodule approx. 2019 or 2020 , benign , and no follow up needed   Vitamin D deficiency    Past Surgical History:  Procedure Laterality Date   DILATATION & CURETTAGE/HYSTEROSCOPY WITH MYOSURE N/A 03/20/2016   Procedure: DILATATION & CURETTAGE/HYSTEROSCOPY WITH MYOSURE;  Surgeon: Maxie Better, MD;  Location: [facility];  Service: Gynecology;  Laterality: N/A;  45 min. requested   DILATATION & CURETTAGE/HYSTEROSCOPY WITH MYOSURE N/A 12/06/2020   Procedure: DILATATION & CURETTAGE/DIAGNOSTIC  HYSTEROSCOPY/ HYSTEROSCOPIC RESECTION OF ENDOMETRIAL POLYP USING MYOSURE;  Surgeon: Maxie Better, MD;  Location: [facility];  Service: Gynecology;  Laterality: N/A;   EXTRACORPOREAL SHOCK WAVE LITHOTRIPSY  04/18/2013   @[facility]    EXTRACORPOREAL SHOCK WAVE LITHOTRIPSY Right 07/07/2021   Procedure: RIGHT EXTRACORPOREAL SHOCK WAVE LITHOTRIPSY (ESWL);  Surgeon: 07/09/2021, MD;  Location: [facility];  Service: Urology;  Laterality: Right;   KNEE ARTHROSCOPY  04/24/2011   Procedure: ARTHROSCOPY KNEE;  Surgeon: 04/26/2011;  Location: [facility];  Service: Orthopedics;  Laterality: Right;  /Arthroscopy with debridement, right knee   LAPAROSCOPIC APPENDECTOMY  03/21/2012   Procedure: APPENDECTOMY LAPAROSCOPIC;  Surgeon: 03/23/2012. Cornett, MD;  Location: [facility];  Service: General;  Laterality: N/A;   URETHRAL STRICTURE DILATATION     age 29   Patient Active Problem List   Diagnosis Date Noted   Genetic testing 02/16/2022   Obesity (BMI 35.0-39.9 without comorbidity) 09/21/2021   Iron deficiency anemia 04/11/2021   Abdominal pain 03/31/2021   Traumatic ecchymosis of right lower leg 03/23/2020   Hematoma of right lower extremity 03/23/2020   Nausea 08/21/2019   Abdominal bloating 08/21/2019   History of COVID-19 06/24/2019   URI (upper respiratory infection) 08/26/2018   Thyroid nodule 02/27/2018   Intractable migraine with aura with status migrainosus 05/14/2017   Stress 01/10/2017   DUB (dysfunctional uterine bleeding) 07/16/2016   Low  back pain 04/30/2015   Fatigue 02/08/2015   Skin lesion of breast 08/06/2014   Health care maintenance 08/06/2014   Nephrolithiasis 02/11/2013   Migraine 02/11/2013   History of frequent urinary tract infections 02/11/2013   Syncope 02/11/2013   IBS (irritable bowel syndrome) 02/11/2013   Anemia 02/11/2013   Vitamin D deficiency 02/11/2013    REFERRING DIAG: Chronic left SIJ joint pain and  chronic low back pain   THERAPY DIAG:  Other low back pain  Chronic left SI joint pain  Rationale for Evaluation and Treatment Rehabilitation  PERTINENT HISTORY: Pt reports that she noticed her back was locking up when she stood up. It has been getting progressively worse since then. She feels the pain when she sits for awhile or walks for a while. She exercises by doing dance videos and using walking pads.   PRECAUTIONS: None   SUBJECTIVE:                                                                                                                                                                                      SUBJECTIVE STATEMENT: Continues to report no pain. She is unsure about what causes the pain especially after driving all the way to [facility] and not feeling an increase in her pain. 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   PAIN:  Are you having pain? Yes: NPRS scale: 0/10 Pain location: Left SIJ  Pain description: Achy  Aggravating factors: Walking or standing for long distances  Relieving factors: Sitting    OBJECTIVE:    VITALS: BP 140/102 SpO2 100 HR 83    DIAGNOSTIC FINDINGS:  CLINICAL DATA:  Patient having lithotripsy this morning.   EXAM: ABDOMEN - 1 VIEW   COMPARISON:  March 31, 2021 June 25, 2021   FINDINGS: There is a 7 mm stone in the right mid kidney and a 3.5 mm stone in the lower right kidney. No other renal stones identified. There is a stable phleboliths in the deep left pelvis. A sclerotic rounded region projects over the left side of the sacrum measuring 4 mm, unchanged since November 2022 and June 25, 2021 but new since 2014.   IMPRESSION: 1. Two stones in the right kidney as above. 2. The 4 mm sclerotic focus projected over the left side of the sacrum is unchanged since November 2022 but new since 2014. This may represent a bone island but is nonspecific. This finding is  more medially located than expected for a ureteral stone. 3. Phlebolith in the deep left pelvis.     Electronically Signed   By: Onalee Hua  Mayford Knife III M.D.   On: 07/07/2021 10:34   PATIENT SURVEYS:  FOTO 61/100   SCREENING FOR RED FLAGS: Bowel or bladder incontinence: No Spinal tumors: No Cauda equina syndrome: No Compression fracture: No Abdominal aneurysm: No   COGNITION: Overall cognitive status: Within functional limits for tasks assessed                          SENSATION: WFL   MUSCLE LENGTH: Hamstrings: Right 90 deg; Left 90 deg Thomas test: Negative Bilateral  Ely's test: Negative Bilateral    POSTURE: decreased lumbar lordosis   PALPATION: Left PSIS    LUMBAR ROM:    AROM eval  Flexion 100%  Extension 100%  Right lateral flexion 100%  Left lateral flexion 100%  Right rotation 100%  Left rotation 100%   (Blank rows = not tested)   LOWER EXTREMITY ROM:          Active  Right 04/01/2022 Left 04/01/2022  Hip flexion 120 120  Hip extension 30 30  Hip abduction 45 45  Hip adduction 30 30  Hip internal rotation 45 45  Hip external rotation 45 45  Knee flexion 135 135  Knee extension 0 0  Ankle dorsiflexion 20 20  Ankle plantarflexion 50 50  Ankle inversion 35 35  Ankle eversion 15 15   (Blank rows = not tested)          LOWER EXTREMITY MMT:         MMT Right eval Left eval  Hip flexion 5 4+  Hip extension 5 5  Hip abduction 4+ 4+  Hip adduction 4- 4-  Hip internal rotation      Hip external rotation      Knee flexion 5 5  Knee extension 5 5  Ankle dorsiflexion 5 5  Ankle plantarflexion      Ankle inversion      Ankle eversion       (Blank rows = not tested)     LUMBAR SPECIAL TESTS:  SI Compression/distraction test: Negative, FABER test: Negative, Gaenslen's test: Negative, Trendelenburg sign: Negative, and Thomas test: Negative,    FUNCTIONAL TESTS:  Single leg stance: NT    GAIT: Distance walked: 50 ft  Assistive  device utilized: None Level of assistance: Complete Independence Comments: No gait deficits noted    TODAY'S TREATMENT:                                                                                                                              DATE:   05/13/22: There.ex: Recumbent Bicycle seat at 11 with resistance of 5 for 5 min Abdominal Bicycles 3 x 10   -Modified to include hands besides head instead of gripping head  Monster Walks GTB 10 meters x 5  Side Steps GTB 10 meters with band around ankles x 10  Forward walks 10 meters with GTB with lunges in between steps x  10  04/29/22 There.ex: Recumbent Bicycle seat at 7 with resistance of 2 for 5 min    Standing hip abduction with SUE support with 4# AW's: 3x8. Min multimodal cuing for upright posture with fair carryover.     Standing hip extension with BUE support with 4# AW's: 3x8  Red physioball exercise:   Glut bridging: 3x8   Hamstring curl + bridge: 3x8   Resisted monster walks with Blue TB: 4 x 10 meters  Seated physioball anti rotation paloff press: RTB, x20/side. Min to mod multimodal cuing for form/technique.       PATIENT EDUCATION:  Education details: form and technique for appropriate exercise and explanation of deficits and SIJ PT diagnosis  Person educated: Patient Education method: Explanation, Demonstration, Verbal cues, and Handouts Education comprehension: verbalized understanding, returned demonstration, and verbal cues required   HOME EXERCISE PROGRAM: Access Code: [email or web address] URL: [email or web address] Date: 05/13/2022 Prepared by: Ellin Goodieaniel Takeysha Bonk  Exercises - Supine Lower Trunk Rotation  - 7 x weekly - 3 sets - 10 reps - Supine Single Knee to Chest Stretch  - 1 x daily - 3 sets - 10 reps - 3 sec  hold - Curl Up with Arms Crossed  - 3 x weekly - 3 sets - 10 reps - Supine Butterfly Groin Stretch  - 1 x daily - 3 reps - 30 sec  hold - Side Stepping with Resistance at Ankles  - 3 x  weekly - 10 reps - Walking Forward Lunge  - 3 x weekly - 10 reps - Standing Shoulder Horizontal Abduction with Resistance  - 3 x weekly - 3 sets - 10 reps   ASSESSMENT:   CLINICAL IMPRESSION:   Continued POC with pt tolerating progression of hip and abdominal strengthening exercises without an increase in her back pain. She is returning to medical team for further eval with potential order for further imaging to identify causes of pain. She will continue with plan of care and to document when she experiences pain and what triggers pain. She will continue to benefit from skilled PT to reduce left SIJ joint pain and to increase hip strength to stand and walk for prolonged periods of time to carry out tasks of her job.    OBJECTIVE IMPAIRMENTS: decreased mobility, decreased strength and pain.    ACTIVITY LIMITATIONS: carrying, bending, squatting, transfers, and locomotion level   PARTICIPATION LIMITATIONS: occupation   PERSONAL FACTORS: Past/current experiences, Time since onset of injury/illness/exacerbation, and 1-2 comorbidities: occipital neuralgia and h/o brother with ankylosing spondylosis are also affecting patient's functional outcome.    REHAB POTENTIAL: Good   CLINICAL DECISION MAKING: Stable/uncomplicated   EVALUATION COMPLEXITY: Low     GOALS: Goals reviewed with patient? No   SHORT TERM GOALS: Target date: 04/15/2022   Pt will be independent with HEP in order to improve strength and balance in order to decrease fall risk and improve function at home and work. Baseline: NT  Goal status: Ongoing    LONG TERM GOALS: Target date: 06/10/2022   Patient will have improved function and activity level as evidenced by an increase in FOTO score by 10 points or more.  Baseline: 60/100 with target of 71  Goal status: Ongoing    2.  Patient will improve hip strength by 1/3 MMT grade for improved spinal stability and reduction of hip pain.  Baseline: Hip Add R/L 4-/4- Hip Flex L 4+,  Hip Abd R/L 4+/4+ Goal status: Ongoing      PLAN:   PT  FREQUENCY: 1-2x/week   PT DURATION: 10 weeks   PLANNED INTERVENTIONS: Therapeutic exercises, Gait training, Joint mobilization, Joint manipulation, Electrical stimulation, Spinal manipulation, Spinal mobilization, Cryotherapy, Moist heat, Manual therapy, and Re-evaluation.   PLAN FOR NEXT SESSION:  Gait analysis, Hip IR and ER MMT. Progress hip strengthening exercises: monster walks, side steps, and resisted forward walks.   Ellin Goodie PT, DPT  Physical Therapist- [facility]  05/19/2022, 9:17 AM

## 2022-05-14 ENCOUNTER — Encounter: Payer: Self-pay | Admitting: Internal Medicine

## 2022-05-14 ENCOUNTER — Ambulatory Visit: Payer: BC Managed Care – PPO | Admitting: Internal Medicine

## 2022-05-14 VITALS — BP 120/68 | HR 75 | Temp 98.0°F | Resp 15 | Ht 65.0 in | Wt 210.2 lb

## 2022-05-14 DIAGNOSIS — D509 Iron deficiency anemia, unspecified: Secondary | ICD-10-CM | POA: Diagnosis not present

## 2022-05-14 DIAGNOSIS — M545 Low back pain, unspecified: Secondary | ICD-10-CM | POA: Diagnosis not present

## 2022-05-14 DIAGNOSIS — E559 Vitamin D deficiency, unspecified: Secondary | ICD-10-CM | POA: Diagnosis not present

## 2022-05-14 DIAGNOSIS — Z1379 Encounter for other screening for genetic and chromosomal anomalies: Secondary | ICD-10-CM

## 2022-05-14 DIAGNOSIS — F439 Reaction to severe stress, unspecified: Secondary | ICD-10-CM

## 2022-05-14 DIAGNOSIS — N200 Calculus of kidney: Secondary | ICD-10-CM

## 2022-05-14 DIAGNOSIS — E041 Nontoxic single thyroid nodule: Secondary | ICD-10-CM | POA: Diagnosis not present

## 2022-05-14 DIAGNOSIS — Z13220 Encounter for screening for lipoid disorders: Secondary | ICD-10-CM

## 2022-05-14 NOTE — Progress Notes (Signed)
Subjective:    Patient ID: Tina Fleming, female    DOB: 26-Apr-1981, 41 y.o.   MRN: [id number]  Patient here for  Chief Complaint  Patient presents with   Medical Management of Chronic Issues    HPI Here to follow up regarding anemia, increased stress and recently evaluated for low back pain.  Going to PT.  Persistent back pain.  No significant change. Flares are "inconsistent".  Has been doing exercises at home. No significant change.  Discussed seeing Dr Wynetta Emery - [facility] for his opinion.  Evaluated by Dr Smith Robert 03/06/22 - high risk breast cancer risk.  Tyrer-Cuzick risk score for lifetime risk of breast cancer 21.4%.  She will be following breast exams.  She discussed mammogram alternating MRI.  Jakerria would Fleming to think about this more.  Discussed.  Increased stress with family issues.  Discussed.  Overall she feels she does not need any further intervention at this time.  No sob reported.  Taking fiber for her bowels.  Overall stable.     Past Medical History:  Diagnosis Date   Bilateral occipital neuralgia    Complication of anesthesia    hard to wake due to  prolonged sedation   DUB (dysfunctional uterine bleeding)    Heart murmur    per pt asymtomatic, dx age 41 and told had normal echo   History of 2019 novel coronavirus disease (COVID-19) 05/2018   per pt mild symptoms that resolved   History of kidney stones    History of urethral stricture    age 89 s/p dilatation   IBS (irritable bowel syndrome)    IDA (iron deficiency anemia)    Intractable migraine with aura without status migrainosus    neruologist--- dr h. Sherryll Burger   Thyroid nodule    per pt had biopsy of nodule approx. 2019 or 2020 , benign , and no follow up needed   Vitamin D deficiency    Past Surgical History:  Procedure Laterality Date   DILATATION & CURETTAGE/HYSTEROSCOPY WITH MYOSURE N/A 03/20/2016   Procedure: DILATATION & CURETTAGE/HYSTEROSCOPY WITH MYOSURE;  Surgeon: Maxie Better, MD;  Location: [facility];  Service: Gynecology;  Laterality: N/A;  45 min. requested   DILATATION & CURETTAGE/HYSTEROSCOPY WITH MYOSURE N/A 12/06/2020   Procedure: DILATATION & CURETTAGE/DIAGNOSTIC HYSTEROSCOPY/ HYSTEROSCOPIC RESECTION OF ENDOMETRIAL POLYP USING MYOSURE;  Surgeon: Maxie Better, MD;  Location: [facility];  Service: Gynecology;  Laterality: N/A;   EXTRACORPOREAL SHOCK WAVE LITHOTRIPSY  04/18/2013   @[facility]    EXTRACORPOREAL SHOCK WAVE LITHOTRIPSY Right 07/07/2021   Procedure: RIGHT EXTRACORPOREAL SHOCK WAVE LITHOTRIPSY (ESWL);  Surgeon: 07/09/2021, MD;  Location: [facility];  Service: Urology;  Laterality: Right;   KNEE ARTHROSCOPY  04/24/2011   Procedure: ARTHROSCOPY KNEE;  Surgeon: 04/26/2011;  Location: [facility];  Service: Orthopedics;  Laterality: Right;  /Arthroscopy with debridement, right knee   LAPAROSCOPIC APPENDECTOMY  03/21/2012   Procedure: APPENDECTOMY LAPAROSCOPIC;  Surgeon: 03/23/2012. Cornett, MD;  Location: [facility];  Service: General;  Laterality: N/A;   URETHRAL STRICTURE DILATATION     age 2   Family History  Problem Relation Age of Onset   Pancreatic cancer Mother        neuroendocrine   Arthritis Mother    Hyperlipidemia Mother    Hypertension Mother    Cancer Father        skin   Arthritis Father    Hyperlipidemia Father    Hypertension  Father    Diabetes Father    Ankylosing spondylitis Brother    Spondylitis Brother    Breast cancer Maternal Aunt        and thyroid cancer? d. 80s   Lung cancer Maternal Uncle    Breast cancer Paternal Aunt        65-60   Breast cancer Paternal Aunt        50-60's   Lung cancer Paternal Grandfather    Breast cancer Cousin        dx 82s   Social History   Socioeconomic History   Marital status: Single    Spouse name: Not on file   Number of children: Not on file   Years of education: Not on file   Highest education level: Not on file  Occupational History   Not  on file  Tobacco Use   Smoking status: Never   Smokeless tobacco: Never  Vaping Use   Vaping Use: Never used  Substance and Sexual Activity   Alcohol use: No    Alcohol/week: 0.0 standard drinks of alcohol   Drug use: No   Sexual activity: Not Currently    Birth control/protection: None  Other Topics Concern   Not on file  Social History Narrative   Not on file   Social Determinants of Health   Financial Resource Strain: Not on file  Food Insecurity: Not on file  Transportation Needs: Not on file  Physical Activity: Not on file  Stress: Not on file  Social Connections: Not on file     Review of Systems  Constitutional:  Negative for appetite change and unexpected weight change.  HENT:  Negative for congestion and sinus pressure.   Respiratory:  Negative for cough, chest tightness and shortness of breath.   Cardiovascular:  Negative for chest pain, palpitations and leg swelling.  Gastrointestinal:  Negative for abdominal pain, diarrhea, nausea and vomiting.  Genitourinary:  Negative for difficulty urinating and dysuria.  Musculoskeletal:  Positive for back pain. Negative for joint swelling and myalgias.  Skin:  Negative for color change and rash.  Neurological:  Negative for dizziness and headaches.  Psychiatric/Behavioral:         Increased stress as outlined.         Objective:     BP 120/68 (BP Location: Left Arm, Patient Position: Sitting, Cuff Size: Large)   Pulse 75   Temp 98 F (36.7 C) (Temporal)   Resp 15   Ht 5\' 5"  (1.651 m)   Wt 210 lb 3.2 oz (95.3 kg)   SpO2 98%   BMI 34.98 kg/m  Wt Readings from Last 3 Encounters:  05/14/22 210 lb 3.2 oz (95.3 kg)  03/06/22 213 lb 6.4 oz (96.8 kg)  02/13/22 214 lb (97.1 kg)    Physical Exam Constitutional:      General: She is not in acute distress. HENT:     Head: Normocephalic and atraumatic.     Right Ear: External ear normal.     Left Ear: External ear normal.     Nose: Nose normal. No congestion.      Mouth/Throat:     Pharynx: Oropharynx is clear. No oropharyngeal exudate or posterior oropharyngeal erythema.  Neck:     Thyroid: No thyromegaly.  Cardiovascular:     Rate and Rhythm: Normal rate and regular rhythm.  Pulmonary:     Effort: No respiratory distress.     Breath sounds: Normal breath sounds. No wheezing.  Abdominal:  General: Bowel sounds are normal.     Palpations: Abdomen is soft.     Tenderness: There is no abdominal tenderness.  Musculoskeletal:        General: No swelling or tenderness.     Cervical back: Neck supple.  Lymphadenopathy:     Cervical: No cervical adenopathy.  Skin:    Findings: No erythema or rash.  Neurological:     Mental Status: She is alert.  Psychiatric:        Mood and Affect: Mood normal.        Behavior: Behavior normal.     Comments: Increased stress as outlined.        Outpatient Encounter Medications as of 05/14/2022  Medication Sig   Bacillus Coagulans-Inulin (PROBIOTIC-PREBIOTIC PO) Take by mouth.   Cholecalciferol (VITAMIN D PO) Take 1 capsule by mouth every morning.   Cyanocobalamin (B-12 PO) Take by mouth daily.   FIBER SELECT GUMMIES PO Take by mouth. Target brand adult fiber gummies   phentermine (ADIPEX-P) 37.5 MG tablet Take 37.5 mg by mouth every morning.   No facility-administered encounter medications on file as of 05/14/2022.     Lab Results  Component Value Date   WBC 8.7 12/29/2021   HGB 13.8 12/29/2021   HCT 43.4 12/29/2021   PLT 384 12/29/2021   GLUCOSE 97 09/29/2021   CHOL 200 09/29/2021   TRIG 102 09/29/2021   HDL 53 09/29/2021   LDLCALC 127 (H) 09/29/2021   ALT 20 09/29/2021   AST 23 09/29/2021   NA 135 09/29/2021   K 4.1 09/29/2021   CL 103 09/29/2021   CREATININE 0.82 09/29/2021   BUN 12 09/29/2021   CO2 26 09/29/2021   TSH 1.256 09/29/2021   INR 1.09 07/14/2016    MM 3D SCREEN BREAST BILATERAL  Result Date: 12/18/2021 CLINICAL DATA:  Screening. EXAM: DIGITAL SCREENING BILATERAL  MAMMOGRAM WITH TOMOSYNTHESIS AND CAD TECHNIQUE: Bilateral screening digital craniocaudal and mediolateral oblique mammograms were obtained. Bilateral screening digital breast tomosynthesis was performed. The images were evaluated with computer-aided detection. COMPARISON:  Previous exam(s). ACR Breast Density Category b: There are scattered areas of fibroglandular density. FINDINGS: There are no findings suspicious for malignancy. IMPRESSION: No mammographic evidence of malignancy. A result letter of this screening mammogram will be mailed directly to the patient. RECOMMENDATION: Screening mammogram in one year. (Code:SM-B-01Y) BI-RADS CATEGORY  1: Negative. Electronically Signed   By: Edwin CapJennifer  Jarosz M.D.   On: 12/18/2021 09:49       Assessment & Plan:  Iron deficiency anemia, unspecified iron deficiency anemia type Assessment & Plan: Seeing Dr Smith Robertao.  Has received IV iron.  Last hgb wnl.  Continue f/y with hematology.   Orders: -     Comprehensive metabolic panel -     TSH  Vitamin D deficiency -     VITAMIN D 25 Hydroxy (Vit-D Deficiency, Fractures)  Thyroid nodule Assessment & Plan: Saw Dr Tedd SiasSolum.  S/p biopsy.  Pt reports no change and that no further w/up warranted.  Last evaluated 10/12/18 by endocrinology.  Follow thyroid function.       Screening cholesterol level -     Lipid panel  Genetic testing Assessment & Plan: Negative genetic testing.  Risk score 21.4%.  Recommend yearly mammogram and yearly MRI.  Victorino DikeJennifer would Fleming to think about this more.  Discussed.   Midline low back pain without sciatica, unspecified chronicity Assessment & Plan: Recently evaluated for low back pain.  Going to PT.  Persistent back pain.  No significant change. Flares are "inconsistent".  Has been doing exercises at home. No significant change.  Discussed seeing Dr Wynetta Emery - [facility] for his opinion.    Orders: -     Ambulatory referral to Neurosurgery  Nephrolithiasis Assessment & Plan: Urology  07/25/21 - Modena Slater - s/p right side ESWL. F/u KUB and urology one year.    Stress Assessment & Plan: Increased stress with family issues.  Discussed.  Overall she feels she does not need any further intervention at this time.        Dale [place], MD

## 2022-05-20 ENCOUNTER — Ambulatory Visit: Payer: BC Managed Care – PPO | Admitting: Physical Therapy

## 2022-05-20 ENCOUNTER — Encounter: Payer: Self-pay | Admitting: Physical Therapy

## 2022-05-20 DIAGNOSIS — G8929 Other chronic pain: Secondary | ICD-10-CM

## 2022-05-20 DIAGNOSIS — M533 Sacrococcygeal disorders, not elsewhere classified: Secondary | ICD-10-CM | POA: Diagnosis not present

## 2022-05-20 DIAGNOSIS — M5459 Other low back pain: Secondary | ICD-10-CM

## 2022-05-20 NOTE — Therapy (Signed)
OUTPATIENT PHYSICAL THERAPY TREATMENT NOTE   Patient Name: Tina Fleming MRN: [id number] DOB:02/18/1981, 41 y.o., female Today's Date: 05/20/2022  PCP: Dr. Dale Durhamharlene Scott  REFERRING PROVIDER: Dr. Dale Durhamharlene Scott   END OF SESSION:   PT End of Session - 05/20/22 1149     Visit Number 7    Number of Visits 20    Date for PT Re-Evaluation 06/10/22    Authorization Type [employer]    Authorization Time Period 04/01/22-06/10/2022    Authorization - Visit Number 7    Authorization - Number of Visits 20    Progress Note Due on Visit 10    PT Start Time 1145    PT Stop Time 1230    PT Time Calculation (min) 45 min    Activity Tolerance Patient tolerated treatment well    [facility] for tasks assessed/performed              Past Medical History:  Diagnosis Date   Bilateral occipital neuralgia    Complication of anesthesia    hard to wake due to  prolonged sedation   DUB (dysfunctional uterine bleeding)    Heart murmur    per pt asymtomatic, dx age 89 and told had normal echo   History of 2019 novel coronavirus disease (COVID-19) 05/2018   per pt mild symptoms that resolved   History of kidney stones    History of urethral stricture    age 43 s/p dilatation   IBS (irritable bowel syndrome)    IDA (iron deficiency anemia)    Intractable migraine with aura without status migrainosus    neruologist--- dr h. Sherryll Burgershah   Thyroid nodule    per pt had biopsy of nodule approx. 2019 or 2020 , benign , and no follow up needed   Vitamin D deficiency    Past Surgical History:  Procedure Laterality Date   DILATATION & CURETTAGE/HYSTEROSCOPY WITH MYOSURE N/A 03/20/2016   Procedure: DILATATION & CURETTAGE/HYSTEROSCOPY WITH MYOSURE;  Surgeon: Maxie BetterSheronette Cousins, MD;  Location: [facility];  Service: Gynecology;  Laterality: N/A;  45 min. requested   DILATATION & CURETTAGE/HYSTEROSCOPY WITH MYOSURE N/A 12/06/2020   Procedure: DILATATION & CURETTAGE/DIAGNOSTIC HYSTEROSCOPY/  HYSTEROSCOPIC RESECTION OF ENDOMETRIAL POLYP USING MYOSURE;  Surgeon: Maxie Betterousins, Sheronette, MD;  [facility] [place];  Service: Gynecology;  Laterality: N/A;   EXTRACORPOREAL SHOCK WAVE LITHOTRIPSY  04/18/2013   @[facility]    EXTRACORPOREAL SHOCK WAVE LITHOTRIPSY Right 07/07/2021   Procedure: RIGHT EXTRACORPOREAL SHOCK WAVE LITHOTRIPSY (ESWL);  Surgeon: Crista ElliotBell, Eugene D III, MD;  [facility] ;  Service: Urology;  Laterality: Right;   KNEE ARTHROSCOPY  04/24/2011   Procedure: ARTHROSCOPY KNEE;  Surgeon: Javier DockerJeffrey C Beane;  Location: [facility];  Service: Orthopedics;  Laterality: Right;  /Arthroscopy with debridement, right knee   LAPAROSCOPIC APPENDECTOMY  03/21/2012   Procedure: APPENDECTOMY LAPAROSCOPIC;  Surgeon: Clovis Puhomas A. Cornett, MD;  Location: [facility];  Service: General;  Laterality: N/A;   URETHRAL STRICTURE DILATATION     age 41   Patient Active Problem List   Diagnosis Date Noted   Genetic testing 02/16/2022   Obesity (BMI 35.0-39.9 without comorbidity) 09/21/2021   Iron deficiency anemia 04/11/2021   Abdominal pain 03/31/2021   Traumatic ecchymosis of right lower leg 03/23/2020   Hematoma of right lower extremity 03/23/2020   Nausea 08/21/2019   Abdominal bloating 08/21/2019   History of COVID-19 06/24/2019   URI (upper respiratory infection) 08/26/2018   Thyroid nodule 02/27/2018  Intractable migraine with aura with status migrainosus 05/14/2017   Stress 01/10/2017   DUB (dysfunctional uterine bleeding) 07/16/2016   Low back pain 04/30/2015   Fatigue 02/08/2015   Skin lesion of breast 08/06/2014   Health care maintenance 08/06/2014   Nephrolithiasis 02/11/2013   Migraine 02/11/2013   History of frequent urinary tract infections 02/11/2013   Syncope 02/11/2013   IBS (irritable bowel syndrome) 02/11/2013   Anemia 02/11/2013   Vitamin D deficiency 02/11/2013    REFERRING DIAG: Chronic left SIJ joint pain and chronic low back  pain   THERAPY DIAG:  Other low back pain  Chronic left SI joint pain  Rationale for Evaluation and Treatment Rehabilitation  PERTINENT HISTORY: Pt reports that she noticed her back was locking up when she stood up. It has been getting progressively worse since then. She feels the pain when she sits for awhile or walks for a while. She exercises by doing dance videos and using walking pads.   PRECAUTIONS: None   SUBJECTIVE:                                                                                                                                                                                      SUBJECTIVE STATEMENT: Pt reports muscle soreness after last session but she thinks this more due to the workout. She is still unclear about cause is so inconsistent.   PAIN:  Are you having pain? Yes: NPRS scale: 0/10 Pain location: Left SIJ  Pain description: Achy  Aggravating factors: Walking or standing for long distances  Relieving factors: Sitting    OBJECTIVE:    VITALS: BP 140/102 SpO2 100 HR 83    DIAGNOSTIC FINDINGS:  CLINICAL DATA:  Patient having lithotripsy this morning.   EXAM: ABDOMEN - 1 VIEW   COMPARISON:  March 31, 2021 June 25, 2021   FINDINGS: There is a 7 mm stone in the right mid kidney and a 3.5 mm stone in the lower right kidney. No other renal stones identified. There is a stable phleboliths in the deep left pelvis. A sclerotic rounded region projects over the left side of the sacrum measuring 4 mm, unchanged since November 2022 and June 25, 2021 but new since 2014.   IMPRESSION: 1. Two stones in the right kidney as above. 2. The 4 mm sclerotic focus projected over the left side of the sacrum is unchanged since November 2022 but new since 2014. This may represent a bone island but is nonspecific. This finding is more medially located than expected for a ureteral stone. 3. Phlebolith in the deep left pelvis.     Electronically Signed    By: Onalee Hua  Mayford Knife III M.D.   On: 07/07/2021 10:34   PATIENT SURVEYS:  FOTO 61/100   SCREENING FOR RED FLAGS: Bowel or bladder incontinence: No Spinal tumors: No Cauda equina syndrome: No Compression fracture: No Abdominal aneurysm: No   COGNITION: Overall cognitive status: Within functional limits for tasks assessed                          SENSATION: WFL   MUSCLE LENGTH: Hamstrings: Right 90 deg; Left 90 deg Thomas test: Negative Bilateral  Ely's test: Negative Bilateral    POSTURE: decreased lumbar lordosis   PALPATION: Left PSIS    LUMBAR ROM:    AROM eval  Flexion 100%  Extension 100%  Right lateral flexion 100%  Left lateral flexion 100%  Right rotation 100%  Left rotation 100%   (Blank rows = not tested)   LOWER EXTREMITY ROM:          Active  Right 04/01/2022 Left 04/01/2022  Hip flexion 120 120  Hip extension 30 30  Hip abduction 45 45  Hip adduction 30 30  Hip internal rotation 45 45  Hip external rotation 45 45  Knee flexion 135 135  Knee extension 0 0  Ankle dorsiflexion 20 20  Ankle plantarflexion 50 50  Ankle inversion 35 35  Ankle eversion 15 15   (Blank rows = not tested)          LOWER EXTREMITY MMT:         MMT Right eval Left eval  Hip flexion 5 4+  Hip extension 5 5  Hip abduction 4+ 4+  Hip adduction 4- 4-  Hip internal rotation      Hip external rotation      Knee flexion 5 5  Knee extension 5 5  Ankle dorsiflexion 5 5  Ankle plantarflexion      Ankle inversion      Ankle eversion       (Blank rows = not tested)     LUMBAR SPECIAL TESTS:  SI Compression/distraction test: Negative, FABER test: Negative, Gaenslen's test: Negative, Trendelenburg sign: Negative, and Thomas test: Negative,    FUNCTIONAL TESTS:  Single leg stance: NT    GAIT: Distance walked: 50 ft  Assistive device utilized: None Level of assistance: Complete Independence Comments: No gait deficits noted    TODAY'S TREATMENT:                                                                                                                               DATE:   05/20/22: There.ex: Recumbent Bicycle seat at 11 with resistance of 5 for 5 min FOTO: 63/100  Matrix Standing Hip Abduction #55 3 x 10 on LLE   Matrix Standing Hip Abduction #70 3 x 10 on RLE  Matrix Standing Hip Adduction #55 3 x 10  -Towel placed over movement arm when doing on right side to prevent irritation to knee  Goblet Squat #20 1 x 10  -Pt reports increased tension in her low back   Standing Forward flexion stretch with straight knees 5 x 30 sec   Goblet Squat #10 2 x 10  -Pt able to perform without feeling as much lumbar tension   05/13/22: There.ex: Recumbent Bicycle seat at 11 with resistance of 5 for 5 min Abdominal Bicycles 3 x 10   -Modified to include hands besides head instead of gripping head  Monster Walks GTB 10 meters x 5  Side Steps GTB 10 meters with band around ankles x 10  Forward walks 10 meters with GTB with lunges in between steps x 10  04/29/22 There.ex: Recumbent Bicycle seat at 7 with resistance of 2 for 5 min    Standing hip abduction with SUE support with 4# AW's: 3x8. Min multimodal cuing for upright posture with fair carryover.     Standing hip extension with BUE support with 4# AW's: 3x8  Red physioball exercise:   Glut bridging: 3x8   Hamstring curl + bridge: 3x8   Resisted monster walks with Blue TB: 4 x 10 meters  Seated physioball anti rotation paloff press: RTB, x20/side. Min to mod multimodal cuing for form/technique.       PATIENT EDUCATION:  Education details: form and technique for appropriate exercise and explanation of deficits and SIJ PT diagnosis  Person educated: Patient Education method: Explanation, Demonstration, Verbal cues, and Handouts Education comprehension: verbalized understanding, returned demonstration, and verbal cues required   HOME EXERCISE PROGRAM: Access Code: [email or web address] URL:  [email or web address] Date: 05/20/2022 Prepared by: Ellin Goodie  Exercises - Supine Lower Trunk Rotation  - 7 x weekly - 3 sets - 10 reps - Supine Single Knee to Chest Stretch  - 1 x daily - 3 sets - 10 reps - 3 sec  hold - Curl Up with Arms Crossed  - 3 x weekly - 3 sets - 10 reps - Supine Butterfly Groin Stretch  - 1 x daily - 3 reps - 30 sec  hold - Side Stepping with Resistance at Ankles  - 3 x weekly - 10 reps - Standing Shoulder Horizontal Abduction with Resistance  - 3 x weekly - 3 sets - 10 reps - Goblet Squat with Kettlebell  - 3 x weekly - 3 sets - 10 reps   ASSESSMENT:   CLINICAL IMPRESSION:   Despite ongoing ability to tolerate exercises without an exacerbation of her symptoms, pt continues to have same amount of low back pain without a clear trigger. Increased resistance of squat with addition of kettlebell. Pt did appear to feel increased pain with facial expression, but this is face that she normally makes and it does not indicate increased pain. She did show an improvement in self perception of function, but she does not feel like this accurately reflects progress in low back pain. She will continue to benefit from skilled PT to reduce left SIJ joint pain and to increase hip strength to stand and walk for prolonged periods of time to carry out tasks of her job.   OBJECTIVE IMPAIRMENTS: decreased mobility, decreased strength and pain.    ACTIVITY LIMITATIONS: carrying, bending, squatting, transfers, and locomotion level   PARTICIPATION LIMITATIONS: occupation   PERSONAL FACTORS: Past/current experiences, Time since onset of injury/illness/exacerbation, and 1-2 comorbidities: occipital neuralgia and h/o brother with ankylosing spondylosis are also affecting patient's functional outcome.    REHAB POTENTIAL: Good   CLINICAL DECISION MAKING: Stable/uncomplicated   EVALUATION COMPLEXITY: Low  GOALS: Goals reviewed with patient? No   SHORT TERM GOALS:  Target date: 04/15/2022   Pt will be independent with HEP in order to improve strength and balance in order to decrease fall risk and improve function at home and work. Baseline: NT  Goal status: Ongoing    LONG TERM GOALS: Target date: 06/10/2022   Patient will have improved function and activity level as evidenced by an increase in FOTO score by 10 points or more.  Baseline: 60/100 with target of 71 05/20/22: 63/100 Goal status: Ongoing    2.  Patient will improve hip strength by 1/3 MMT grade for improved spinal stability and reduction of hip pain.  Baseline: Hip Add R/L 4-/4- Hip Flex L 4+, Hip Abd R/L 4+/4+ Goal status: Ongoing      PLAN:   PT FREQUENCY: 1-2x/week   PT DURATION: 10 weeks   PLANNED INTERVENTIONS: Therapeutic exercises, Gait training, Joint mobilization, Joint manipulation, Electrical stimulation, Spinal manipulation, Spinal mobilization, Cryotherapy, Moist heat, Manual therapy, and Re-evaluation.   PLAN FOR NEXT SESSION:  SIJ mobilizations. Continue to progress abdominal and glute strengthening exercises. Hip IR and ER MMT.   Ellin Goodie PT, DPT  Physical Therapist- [facility]  05/20/2022, 11:50 AM

## 2022-05-24 ENCOUNTER — Encounter: Payer: Self-pay | Admitting: Internal Medicine

## 2022-05-24 NOTE — Assessment & Plan Note (Signed)
Negative genetic testing.  Risk score 21.4%.  Recommend yearly mammogram and yearly MRI.  Tina Fleming would like to think about this more.  Discussed.

## 2022-05-24 NOTE — Assessment & Plan Note (Signed)
Saw Dr Tedd Sias.  S/p biopsy.  Pt reports no change and that no further w/up warranted.  Last evaluated 10/12/18 by endocrinology.  Follow thyroid function.

## 2022-05-24 NOTE — Assessment & Plan Note (Signed)
Seeing Dr Smith Robert.  Has received IV iron.  Last hgb wnl.  Continue f/y with hematology.

## 2022-05-24 NOTE — Assessment & Plan Note (Signed)
Recently evaluated for low back pain.  Going to PT.  Persistent back pain.  No significant change. Flares are "inconsistent".  Has been doing exercises at home. No significant change.  Discussed seeing Dr Wynetta Emery - [facility] for his opinion.

## 2022-05-24 NOTE — Assessment & Plan Note (Signed)
Urology 07/25/21 - Modena Slater - s/p right side ESWL. F/u KUB and urology one year.

## 2022-05-24 NOTE — Assessment & Plan Note (Addendum)
Increased stress with family issues.  Discussed.  Overall she feels she does not need any further intervention at this time.

## 2022-05-24 NOTE — Addendum Note (Signed)
Addended by: Charm Barges on: 05/24/2022 05:34 PM   Modules accepted: Level of Service

## 2022-06-02 ENCOUNTER — Ambulatory Visit: Payer: BC Managed Care – PPO | Admitting: Physical Therapy

## 2022-06-02 ENCOUNTER — Encounter: Payer: Self-pay | Admitting: Licensed Clinical Social Worker

## 2022-06-10 ENCOUNTER — Ambulatory Visit: Payer: BC Managed Care – PPO | Attending: Internal Medicine | Admitting: Physical Therapy

## 2022-06-10 ENCOUNTER — Encounter: Payer: Self-pay | Admitting: Physical Therapy

## 2022-06-10 DIAGNOSIS — G8929 Other chronic pain: Secondary | ICD-10-CM | POA: Diagnosis present

## 2022-06-10 DIAGNOSIS — M5459 Other low back pain: Secondary | ICD-10-CM | POA: Diagnosis not present

## 2022-06-10 DIAGNOSIS — M533 Sacrococcygeal disorders, not elsewhere classified: Secondary | ICD-10-CM | POA: Insufficient documentation

## 2022-06-10 NOTE — Therapy (Signed)
OUTPATIENT PHYSICAL THERAPY TREATMENT NOTE/ Re-Certification   Dates of Reporting: 04/01/22-06/10/22  Patient Name: Tina Fleming MRN: [id number] DOB:1981-04-29, 42 y.o., female Today's Date: 06/10/2022  PCP: Dr. Dale Gordon  REFERRING PROVIDER: Dr. Dale Sumas   END OF SESSION:   PT End of Session - 06/10/22 1423     Visit Number 8    Number of Visits 20    Date for PT Re-Evaluation 06/10/22    Authorization Type [employer]    Authorization Time Period 04/01/22-06/10/2022    Authorization - Visit Number 8    Authorization - Number of Visits 20    Progress Note Due on Visit 10    PT Start Time 1420    PT Stop Time 1500    PT Time Calculation (min) 40 min    Activity Tolerance Patient tolerated treatment well    Behavior During Therapy [facility] for tasks assessed/performed              Past Medical History:  Diagnosis Date   Bilateral occipital neuralgia    Complication of anesthesia    hard to wake due to  prolonged sedation   DUB (dysfunctional uterine bleeding)    Heart murmur    per pt asymtomatic, dx age 26 and told had normal echo   History of 2019 novel coronavirus disease (COVID-19) 05/2018   per pt mild symptoms that resolved   History of kidney stones    History of urethral stricture    age 47 s/p dilatation   IBS (irritable bowel syndrome)    IDA (iron deficiency anemia)    Intractable migraine with aura without status migrainosus    neruologist--- dr h. Sherryll Burger   Thyroid nodule    per pt had biopsy of nodule approx. 2019 or 2020 , benign , and no follow up needed   Vitamin D deficiency    Past Surgical History:  Procedure Laterality Date   DILATATION & CURETTAGE/HYSTEROSCOPY WITH MYOSURE N/A 03/20/2016   Procedure: DILATATION & CURETTAGE/HYSTEROSCOPY WITH MYOSURE;  Surgeon: Maxie Better, MD;  Location: [facility];  Service: Gynecology;  Laterality: N/A;  45 min. requested   DILATATION & CURETTAGE/HYSTEROSCOPY WITH MYOSURE N/A 12/06/2020   Procedure:  DILATATION & CURETTAGE/DIAGNOSTIC HYSTEROSCOPY/ HYSTEROSCOPIC RESECTION OF ENDOMETRIAL POLYP USING MYOSURE;  Surgeon: Maxie Better, MD;  Location: [facility] [place];  Service: Gynecology;  Laterality: N/A;   EXTRACORPOREAL SHOCK WAVE LITHOTRIPSY  04/18/2013   @[facility]    EXTRACORPOREAL SHOCK WAVE LITHOTRIPSY Right 07/07/2021   Procedure: RIGHT EXTRACORPOREAL SHOCK WAVE LITHOTRIPSY (ESWL);  Surgeon: 07/09/2021, MD;  Location: [facility];  Service: Urology;  Laterality: Right;   KNEE ARTHROSCOPY  04/24/2011   Procedure: ARTHROSCOPY KNEE;  Surgeon: 04/26/2011;  Location: [facility];  Service: Orthopedics;  Laterality: Right;  /Arthroscopy with debridement, right knee   LAPAROSCOPIC APPENDECTOMY  03/21/2012   Procedure: APPENDECTOMY LAPAROSCOPIC;  Surgeon: 03/23/2012. Cornett, MD;  Location: [facility];  Service: General;  Laterality: N/A;   URETHRAL STRICTURE DILATATION     age 3   Patient Active Problem List   Diagnosis Date Noted   Genetic testing 02/16/2022   Obesity (BMI 35.0-39.9 without comorbidity) 09/21/2021   Iron deficiency anemia 04/11/2021   Abdominal pain 03/31/2021   Traumatic ecchymosis of right lower leg 03/23/2020   Hematoma of right lower extremity 03/23/2020   Nausea 08/21/2019   Abdominal bloating 08/21/2019   History of COVID-19 06/24/2019   URI (upper respiratory infection) 08/26/2018  Thyroid nodule 02/27/2018   Intractable migraine with aura with status migrainosus 05/14/2017   Stress 01/10/2017   DUB (dysfunctional uterine bleeding) 07/16/2016   Low back pain 04/30/2015   Fatigue 02/08/2015   Skin lesion of breast 08/06/2014   Health care maintenance 08/06/2014   Nephrolithiasis 02/11/2013   Migraine 02/11/2013   History of frequent urinary tract infections 02/11/2013   Syncope 02/11/2013   IBS (irritable bowel syndrome) 02/11/2013   Anemia 02/11/2013   Vitamin D deficiency 02/11/2013    REFERRING DIAG:  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 It has been getting progressively worse since then. She feels the pain when she sits for awhile or walks for a while. She exercises by doing dance videos and using walking pads.   PRECAUTIONS: None   SUBJECTIVE:                                                                                                                                                                                      SUBJECTIVE STATEMENT: Pt reports increased low back pain over the past couple of weeks especially when standing. She had an MRI ordered but she is awaiting approval from insurance.   PAIN:  Are you having pain? Yes: NPRS scale: 5/10 Pain location: Left SIJ  Pain description: Achy  Aggravating factors: Walking or standing for long distances  Relieving factors: Sitting    OBJECTIVE:    VITALS: BP 140/102 SpO2 100 HR 83    DIAGNOSTIC FINDINGS:  CLINICAL DATA:  Patient having lithotripsy this morning.   EXAM: ABDOMEN - 1 VIEW   COMPARISON:  March 31, 2021 June 25, 2021   FINDINGS: There is a 7 mm stone in the right mid kidney and a 3.5 mm stone in the lower right kidney. No other renal stones identified. There is a stable phleboliths in the deep left pelvis. A sclerotic rounded region projects over the left side of the sacrum measuring 4 mm, unchanged since November 2022 and June 25, 2021 but new since 2014.   IMPRESSION: 1. Two stones in the right kidney as above. 2. The 4 mm sclerotic focus projected over the left side of the sacrum is unchanged since November 2022 but new since 2014. This may represent a bone island but is nonspecific. This finding is more medially located than expected for a ureteral stone. 3.  Phlebolith in the deep left pelvis.  Electronically Signed   By: Dorise Bullion III M.D.   On: 07/07/2021 10:34   PATIENT SURVEYS:  FOTO 61/100   SCREENING FOR RED FLAGS: Bowel or bladder incontinence: No Spinal tumors: No Cauda equina syndrome: No Compression fracture: No Abdominal aneurysm: No   COGNITION: Overall cognitive status: Within functional limits for tasks assessed                          SENSATION: WFL   MUSCLE LENGTH: Hamstrings: Right 90 deg; Left 90 deg Thomas test: Negative Bilateral  Ely's test: Negative Bilateral    POSTURE: decreased lumbar lordosis   PALPATION: Left PSIS    LUMBAR ROM:    AROM eval  Flexion 100%  Extension 100%  Right lateral flexion 100%  Left lateral flexion 100%  Right rotation 100%  Left rotation 100%   (Blank rows = not tested)   LOWER EXTREMITY ROM:          Active  Right 04/01/2022 Left 04/01/2022  Hip flexion 120 120  Hip extension 30 30  Hip abduction 45 45  Hip adduction 30 30  Hip internal rotation 45 45  Hip external rotation 45 45  Knee flexion 135 135  Knee extension 0 0  Ankle dorsiflexion 20 20  Ankle plantarflexion 50 50  Ankle inversion 35 35  Ankle eversion 15 15   (Blank rows = not tested)          LOWER EXTREMITY MMT:         MMT Right eval Left eval  Hip flexion 5 4+  Hip extension 5 5  Hip abduction 4+ 4+  Hip adduction 4- 4-  Hip internal rotation      Hip external rotation      Knee flexion 5 5  Knee extension 5 5  Ankle dorsiflexion 5 5  Ankle plantarflexion      Ankle inversion      Ankle eversion       (Blank rows = not tested)     LUMBAR SPECIAL TESTS:  SI Compression/distraction test: Negative, FABER test: Negative, Gaenslen's test: Negative, Trendelenburg sign: Negative, and Thomas test: Negative,    FUNCTIONAL TESTS:  Single leg stance: NT    GAIT: Distance walked: 50 ft  Assistive device utilized: None Level of assistance: Complete  Independence Comments: No gait deficits noted    TODAY'S TREATMENT:                                                                                                                              DATE:   06/10/22:  Therex  Recumbent Bicycle seat at 11 with resistance of 1 for 5 min Figure 4 Bridges 3 x 10  Lower Trunk Rotations 3 x 10  Loaded Arm Crunch with Arms Extended #15 DB 1 x 10  Loaded Arm Crunch with Arms Extended #10 DB 1 x 10  Hip MMT: See  scores below in goal section   05/20/22: There.ex: Recumbent Bicycle seat at 11 with resistance of 5 for 5 min FOTO: 63/100  Matrix Standing Hip Abduction #55 3 x 10 on LLE   Matrix Standing Hip Abduction #70 3 x 10 on RLE  Matrix Standing Hip Adduction #55 3 x 10  -Towel placed over movement arm when doing on right side to prevent irritation to knee   Goblet Squat #20 1 x 10  -Pt reports increased tension in her low back   Standing Forward flexion stretch with straight knees 5 x 30 sec   Goblet Squat #10 2 x 10  -Pt able to perform without feeling as much lumbar tension   05/13/22: There.ex: Recumbent Bicycle seat at 11 with resistance of 5 for 5 min Abdominal Bicycles 3 x 10   -Modified to include hands besides head instead of gripping head  Monster Walks GTB 10 meters x 5  Side Steps GTB 10 meters with band around ankles x 10  Forward walks 10 meters with GTB with lunges in between steps x 10  04/29/22 There.ex: Recumbent Bicycle seat at 7 with resistance of 2 for 5 min    Standing hip abduction with SUE support with 4# AW's: 3x8. Min multimodal cuing for upright posture with fair carryover.     Standing hip extension with BUE support with 4# AW's: 3x8  Red physioball exercise:   Glut bridging: 3x8   Hamstring curl + bridge: 3x8   Resisted monster walks with Blue TB: 4 x 10 meters  Seated physioball anti rotation paloff press: RTB, x20/side. Min to mod multimodal cuing for form/technique.       PATIENT EDUCATION:   Education details: form and technique for appropriate exercise and explanation of deficits and SIJ PT diagnosis  Person educated: Patient Education method: Explanation, Demonstration, Verbal cues, and Handouts Education comprehension: verbalized understanding, returned demonstration, and verbal cues required   HOME EXERCISE PROGRAM: Access Code: [email or web address] URL: [email or web address] Date: 06/10/2022 Prepared by: Ellin Goodie  Exercises - Supine Lower Trunk Rotation  - 7 x weekly - 3 sets - 10 reps - Supine Single Knee to Chest Stretch  - 1 x daily - 3 sets - 10 reps - 3 sec  hold - Curl Up with Arms Crossed  - 3 x weekly - 3 sets - 10 reps - Supine Butterfly Groin Stretch  - 1 x daily - 3 reps - 30 sec  hold - Side Stepping with Resistance at Ankles  - 3 x weekly - 10 reps - Standing Shoulder Horizontal Abduction with Resistance  - 3 x weekly - 3 sets - 10 reps - Goblet Squat with Kettlebell  - 3 x weekly - 3 sets - 10 reps - Figure 4 Bridge  - 3 x weekly - 3 sets - 10 reps - Ab Prep  - 3 x weekly - 3 sets - 10 reps   ASSESSMENT:   CLINICAL IMPRESSION:    Pt was able to tolerate all exercises without an increase in her low back even with her having higher initial NPS score. She is seeking out further medical and eval for suspicion of ankylosing spondylosis given family history. She was able to demonstrate an increase in abdominal and hip strength with ability to perform weighted crunches and improvement in hip MMT score. She will continue to benefit from skilled PT to reduce left SIJ joint pain and to increase hip strength to stand and walk for prolonged periods of time  to carry out tasks of her job.    OBJECTIVE IMPAIRMENTS: decreased mobility, decreased strength and pain.    ACTIVITY LIMITATIONS: carrying, bending, squatting, transfers, and locomotion level   PARTICIPATION LIMITATIONS: occupation   PERSONAL FACTORS: Past/current experiences, Time since onset of  injury/illness/exacerbation, and 1-2 comorbidities: occipital neuralgia and h/o brother with ankylosing spondylosis are also affecting patient's functional outcome.    REHAB POTENTIAL: Good   CLINICAL DECISION MAKING: Stable/uncomplicated   EVALUATION COMPLEXITY: Low     GOALS: Goals reviewed with patient? No   SHORT TERM GOALS: Target date: 04/15/2022   Pt will be independent with HEP in order to improve strength and balance in order to decrease fall risk and improve function at home and work. Baseline: NT  Goal status: Ongoing    LONG TERM GOALS: Target date: 06/10/2022   Patient will have improved function and activity level as evidenced by an increase in FOTO score by 10 points or more.  Baseline: 60/100 with target of 71 05/20/22: 63/100 Goal status: Ongoing    2.  Patient will improve hip strength by 1/3 MMT grade for improved spinal stability and reduction of hip pain.  Baseline: Hip Add R/L 4-/4- Hip Flex L 4+, Hip Abd R/L 4+/4+  06/10/22: Hip Add R/L 4/4 Hip Flex L 4+, Hip Abd R/L 5/5   Goal status: Ongoing      PLAN:   PT FREQUENCY: 1-2x/week   PT DURATION: 10 weeks   PLANNED INTERVENTIONS: Therapeutic exercises, Gait training, Joint mobilization, Joint manipulation, Electrical stimulation, Spinal manipulation, Spinal mobilization, Cryotherapy, Moist heat, Manual therapy, and Re-evaluation.   PLAN FOR NEXT SESSION:  Continue to progress abdominal and glute strengthening exercises. Hip IR and ER MMT. Hip Adduction exercise and left hip flexion strengthening   Bradly Chris PT, DPT  Physical Therapist- [facility]  06/10/2022, 2:24 PM

## 2022-06-11 ENCOUNTER — Encounter: Payer: Self-pay | Admitting: Internal Medicine

## 2022-06-11 DIAGNOSIS — M533 Sacrococcygeal disorders, not elsewhere classified: Secondary | ICD-10-CM | POA: Insufficient documentation

## 2022-06-16 ENCOUNTER — Encounter: Payer: Self-pay | Admitting: Physical Therapy

## 2022-06-16 ENCOUNTER — Ambulatory Visit: Payer: BC Managed Care – PPO | Admitting: Physical Therapy

## 2022-06-16 DIAGNOSIS — G8929 Other chronic pain: Secondary | ICD-10-CM

## 2022-06-16 DIAGNOSIS — M5459 Other low back pain: Secondary | ICD-10-CM | POA: Diagnosis not present

## 2022-06-16 NOTE — Therapy (Signed)
OUTPATIENT PHYSICAL THERAPY TREATMENT NOTE  Dates of Reporting: 04/01/22-06/10/22  Patient Name: Tina Fleming MRN: [id number] DOB:[birth year], 42 y.o., female Today's Date: 06/16/2022  PCP: Dr. Einar Pheasant  REFERRING PROVIDER: Dr. Einar Pheasant   END OF SESSION:   PT End of Session - 06/16/22 1509     Visit Number 9    Number of Visits 20    Date for PT Re-Evaluation 08/11/22    Authorization Type [employer]    Authorization Time Period 06/12/22-08/11/22    Authorization - Visit Number 9    Authorization - Number of Visits 20    Progress Note Due on Visit 10    PT Start Time 1505    PT Stop Time 1545    PT Time Calculation (min) 40 min    Activity Tolerance Patient tolerated treatment well    Behavior During Therapy [facility] for tasks assessed/performed              Past Medical History:  Diagnosis Date   Bilateral occipital neuralgia    Complication of anesthesia    hard to wake due to  prolonged sedation   DUB (dysfunctional uterine bleeding)    Heart murmur    per pt asymtomatic, dx age 62 and told had normal echo   History of 2019 novel coronavirus disease (COVID-19) 05/2018   per pt mild symptoms that resolved   History of kidney stones    History of urethral stricture    age 12 s/p dilatation   IBS (irritable bowel syndrome)    IDA (iron deficiency anemia)    Intractable migraine with aura without status migrainosus    neruologist--- dr h. Manuella Ghazi   Thyroid nodule    per pt had biopsy of nodule approx. 2019 or 2020 , benign , and no follow up needed   Vitamin D deficiency    Past Surgical History:  Procedure Laterality Date   DILATATION & CURETTAGE/HYSTEROSCOPY WITH MYOSURE N/A 03/20/2016   Procedure: DILATATION & CURETTAGE/HYSTEROSCOPY WITH MYOSURE;  Surgeon: Servando Salina, MD;  Location: [facility];  Service: Gynecology;  Laterality: N/A;  45 min. requested   DILATATION & CURETTAGE/HYSTEROSCOPY WITH MYOSURE N/A 12/06/2020   Procedure: DILATATION &  CURETTAGE/DIAGNOSTIC HYSTEROSCOPY/ HYSTEROSCOPIC RESECTION OF ENDOMETRIAL POLYP USING MYOSURE;  Surgeon: Servando Salina, MD;  Location: [place];  Service: Gynecology;  Laterality: N/A;   EXTRACORPOREAL SHOCK WAVE LITHOTRIPSY  04/18/2013   @[facility]    EXTRACORPOREAL SHOCK WAVE LITHOTRIPSY Right 07/07/2021   Procedure: RIGHT EXTRACORPOREAL SHOCK WAVE LITHOTRIPSY (ESWL);  Surgeon: Lucas Mallow, MD;  Location: [facility];  Service: Urology;  Laterality: Right;   KNEE ARTHROSCOPY  04/24/2011   Procedure: ARTHROSCOPY KNEE;  Surgeon: Johnn Hai;  Location: [place];  Service: Orthopedics;  Laterality: Right;  /Arthroscopy with debridement, right knee   LAPAROSCOPIC APPENDECTOMY  03/21/2012   Procedure: APPENDECTOMY LAPAROSCOPIC;  Surgeon: Joyice Faster. Cornett, MD;  Location: [place];  Service: General;  Laterality: N/A;   URETHRAL STRICTURE DILATATION     age 66   Patient Active Problem List   Diagnosis Date Noted   Sacral mass 06/11/2022   Genetic testing 02/16/2022   Obesity (BMI 35.0-39.9 without comorbidity) 09/21/2021   Iron deficiency anemia 04/11/2021   Abdominal pain 03/31/2021   Traumatic ecchymosis of right lower leg 03/23/2020   Hematoma of right lower extremity 03/23/2020   Nausea 08/21/2019   Abdominal bloating 08/21/2019   History of COVID-19 06/24/2019   URI (upper respiratory  infection) 08/26/2018   Thyroid nodule 02/27/2018   Intractable migraine with aura with status migrainosus 05/14/2017   Stress 01/10/2017   DUB (dysfunctional uterine bleeding) 07/16/2016   Low back pain 04/30/2015   Fatigue 02/08/2015   Skin lesion of breast 08/06/2014   Health care maintenance 08/06/2014   Nephrolithiasis 02/11/2013   Migraine 02/11/2013   History of frequent urinary tract infections 02/11/2013   Syncope 02/11/2013   IBS (irritable bowel syndrome) 02/11/2013   Anemia 02/11/2013   Vitamin D deficiency 02/11/2013     REFERRING DIAG: 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 It has been getting progressively worse since then. She feels the pain when she sits for awhile or walks for a while. She exercises by doing dance videos and using walking pads.   PRECAUTIONS: None   SUBJECTIVE:                                                                                                                                                                                      SUBJECTIVE STATEMENT: Pt has been feeling better since last session. She is not sure what is causing her improvement. She has stayed away from walking pad but that is the only thing she can think of that is different.    PAIN:  Are you having pain? Yes: NPRS scale: 1-2/10 Pain location: Left SIJ  Pain description: Achy  Aggravating factors: Walking or standing for long distances  Relieving factors: Sitting    OBJECTIVE:    VITALS: BP 140/102 SpO2 100 HR 83    DIAGNOSTIC FINDINGS:  CLINICAL DATA:  Patient having lithotripsy this morning.   EXAM: ABDOMEN - 1 VIEW   COMPARISON:  March 31, 2021 June 25, 2021   FINDINGS: There is a 7 mm stone in the right mid kidney and a 3.5 mm stone in the lower right kidney. No other renal stones identified. There is a stable phleboliths in the deep left pelvis. A sclerotic rounded region projects over the left side of the sacrum measuring 4 mm, unchanged since November 2022 and June 25, 2021 but new since 2014.   IMPRESSION: 1. Two stones in the right kidney as above. 2. The 4 mm sclerotic focus projected over the left side of the sacrum is unchanged since November 2022 but new since 2014. This may represent a bone island but is nonspecific. This finding is  more medially located than  expected for a ureteral stone. 3. Phlebolith in the deep left pelvis.     Electronically Signed   By: Dorise Bullion III M.D.   On: 07/07/2021 10:34   PATIENT SURVEYS:  FOTO 61/100   SCREENING FOR RED FLAGS: Bowel or bladder incontinence: No Spinal tumors: No Cauda equina syndrome: No Compression fracture: No Abdominal aneurysm: No   COGNITION: Overall cognitive status: Within functional limits for tasks assessed                          SENSATION: WFL   MUSCLE LENGTH: Hamstrings: Right 90 deg; Left 90 deg Thomas test: Negative Bilateral  Ely's test: Negative Bilateral    POSTURE: decreased lumbar lordosis   PALPATION: Left PSIS    LUMBAR ROM:    AROM eval  Flexion 100%  Extension 100%  Right lateral flexion 100%  Left lateral flexion 100%  Right rotation 100%  Left rotation 100%   (Blank rows = not tested)   LOWER EXTREMITY ROM:          Active  Right 04/01/2022 Left 04/01/2022  Hip flexion 120 120  Hip extension 30 30  Hip abduction 45 45  Hip adduction 30 30  Hip internal rotation 45 45  Hip external rotation 45 45  Knee flexion 135 135  Knee extension 0 0  Ankle dorsiflexion 20 20  Ankle plantarflexion 50 50  Ankle inversion 35 35  Ankle eversion 15 15   (Blank rows = not tested)          LOWER EXTREMITY MMT:         MMT Right eval Left eval  Hip flexion 5 4+  Hip extension 5 5  Hip abduction 4+ 4+  Hip adduction 4- 4-  Hip internal rotation      Hip external rotation      Knee flexion 5 5  Knee extension 5 5  Ankle dorsiflexion 5 5  Ankle plantarflexion      Ankle inversion      Ankle eversion       (Blank rows = not tested)     LUMBAR SPECIAL TESTS:  SI Compression/distraction test: Negative, FABER test: Negative, Gaenslen's test: Negative, Trendelenburg sign: Negative, and Thomas test: Negative,    FUNCTIONAL TESTS:  Single leg stance: NT    GAIT: Distance walked: 50 ft  Assistive  device utilized: None Level of assistance: Complete Independence Comments: No gait deficits noted    TODAY'S TREATMENT:                                                                                                                              DATE:   06/16/22: Therex  Recumbent Bicycle seat at 11 with resistance of 3 for 5 min Science writer with BUE support 2 x 10  -mod VC for position of knee behind toes Standing Quad Stretch 3 x 30 sec  06/10/22:  Therex  Recumbent Bicycle seat at 11 with resistance of 1 for 5 min Figure 4 Bridges 3 x 10  Lower Trunk Rotations 3 x 10  Loaded Arm Crunch with Arms Extended #15 DB 1 x 10  Loaded Arm Crunch with Arms Extended #10 DB 1 x 10  Hip MMT: See scores below in goal section   05/20/22: There.ex: Recumbent Bicycle seat at 11 with resistance of 5 for 5 min FOTO: 63/100  Matrix Standing Hip Abduction #55 3 x 10 on LLE   Matrix Standing Hip Abduction #70 3 x 10 on RLE  Matrix Standing Hip Adduction #55 3 x 10  -Towel placed over movement arm when doing on right side to prevent irritation to knee   Goblet Squat #20 1 x 10  -Pt reports increased tension in her low back   Standing Forward flexion stretch with straight knees 5 x 30 sec   Goblet Squat #10 2 x 10  -Pt able to perform without feeling as much lumbar tension   05/13/22: There.ex: Recumbent Bicycle seat at 11 with resistance of 5 for 5 min Abdominal Bicycles 3 x 10   -Modified to include hands besides head instead of gripping head  Monster Walks GTB 10 meters x 5  Side Steps GTB 10 meters with band around ankles x 10  Forward walks 10 meters with GTB with lunges in between steps x 10  04/29/22 There.ex: Recumbent Bicycle seat at 7 with resistance of 2 for 5 min    Standing hip abduction with SUE support with 4# AW's: 3x8. Min multimodal cuing for upright posture with fair carryover.     Standing hip extension with BUE support with 4# AW's: 3x8  Red physioball  exercise:   Glut bridging: 3x8   Hamstring curl + bridge: 3x8   Resisted monster walks with Blue TB: 4 x 10 meters  Seated physioball anti rotation paloff press: RTB, x20/side. Min to mod multimodal cuing for form/technique.       PATIENT EDUCATION:  Education details: form and technique for appropriate exercise and explanation of deficits and SIJ PT diagnosis  Person educated: Patient Education method: Explanation, Demonstration, Verbal cues, and Handouts Education comprehension: verbalized understanding, returned demonstration, and verbal cues required   HOME EXERCISE PROGRAM: Access Code: [email or web address] URL: [email or web address] Date: 06/16/2022 Prepared by: Bradly Chris  Exercises - Supine Lower Trunk Rotation  - 7 x weekly - 3 sets - 10 reps - Supine Single Knee to Chest Stretch  - 1 x daily - 3 sets - 10 reps - 3 sec  hold - Curl Up with Arms Crossed  - 3 x weekly - 3 sets - 10 reps - Supine Butterfly Groin Stretch  - 1 x daily - 3 reps - 30 sec  hold - Side Stepping with Resistance at Ankles  - 3 x weekly - 10 reps - Standing Shoulder Horizontal Abduction with Resistance  - 3 x weekly - 3 sets - 10 reps - Goblet Squat with Kettlebell  - 3 x weekly - 3 sets - 10 reps - Ab Prep  - 3 x weekly - 3 sets - 10 reps - Single Leg Sit to Stand with Arms Extended  - 3 x weekly - 3 sets - 10 reps   ASSESSMENT:   CLINICAL IMPRESSION:    Pt somewhat limited during session due to muscle fatigue which she describes as spasms. Modified single leg stance exercises from [place] split squats to single leg sit to  stand with elevated mat height to avoid increased discomfort from muscle spasms. Pt tolerated these modifications better. She will be getting MRI before session to provide more detail about ongoing left sided low back pain. Next session will serve as progress note with reassessment of goals to determine ongoing plan of care. She will continue to benefit from skilled PT  to reduce left SIJ joint pain and to increase hip strength to stand and walk for prolonged periods of time to carry out tasks of her job.  OBJECTIVE IMPAIRMENTS: decreased mobility, decreased strength and pain.    ACTIVITY LIMITATIONS: carrying, bending, squatting, transfers, and locomotion level   PARTICIPATION LIMITATIONS: occupation   PERSONAL FACTORS: Past/current experiences, Time since onset of injury/illness/exacerbation, and 1-2 comorbidities: occipital neuralgia and h/o brother with ankylosing spondylosis are also affecting patient's functional outcome.    REHAB POTENTIAL: Good   CLINICAL DECISION MAKING: Stable/uncomplicated   EVALUATION COMPLEXITY: Low     GOALS: Goals reviewed with patient? No   SHORT TERM GOALS: Target date: 04/15/2022   Pt will be independent with HEP in order to improve strength and balance in order to decrease fall risk and improve function at home and work. Baseline: NT  Goal status: Ongoing    LONG TERM GOALS: Target date: 06/10/2022   Patient will have improved function and activity level as evidenced by an increase in FOTO score by 10 points or more.  Baseline: 60/100 with target of 71 05/20/22: 63/100 Goal status: Ongoing    2.  Patient will improve hip strength by 1/3 MMT grade for improved spinal stability and reduction of hip pain.  Baseline: Hip Add R/L 4-/4- Hip Flex L 4+, Hip Abd R/L 4+/4+  06/10/22: Hip Add R/L 4/4 Hip Flex L 4+, Hip Abd R/L 5/5   Goal status: Ongoing      PLAN:   PT FREQUENCY: 1-2x/week   PT DURATION: 10 weeks   PLANNED INTERVENTIONS: Therapeutic exercises, Gait training, Joint mobilization, Joint manipulation, Electrical stimulation, Spinal manipulation, Spinal mobilization, Cryotherapy, Moist heat, Manual therapy, and Re-evaluation.   PLAN FOR NEXT SESSION:  Reassess goals. Continue to progress abdominal and glute strengthening exercises with single leg loading. Hip IR and ER MMT. Hip Adduction exercise and  left hip flexion strengthening   Ellin Goodie PT, DPT  Physical Therapist- [facility]  [facility]  06/16/2022, 3:11 PM

## 2022-06-24 ENCOUNTER — Encounter: Payer: Self-pay | Admitting: Physical Therapy

## 2022-06-24 ENCOUNTER — Ambulatory Visit: Payer: BC Managed Care – PPO | Admitting: Physical Therapy

## 2022-06-24 DIAGNOSIS — M5459 Other low back pain: Secondary | ICD-10-CM

## 2022-06-24 DIAGNOSIS — G8929 Other chronic pain: Secondary | ICD-10-CM

## 2022-06-24 NOTE — Therapy (Signed)
OUTPATIENT PHYSICAL THERAPY TREATMENT NOTE  Dates of Reporting: 04/01/22-06/10/22  Patient Name: Tina Fleming MRN: [id number] DOB:08/23/1980, 41 y.o., female Today's Date: 06/24/2022  PCP: Dr. Einar Pheasant  REFERRING PROVIDER: Dr. Einar Pheasant   END OF SESSION:   PT End of Session - 06/24/22 1419     Visit Number 10    Number of Visits 20    Date for PT Re-Evaluation 08/11/22    Authorization Type [employer]    Authorization Time Period 06/12/22-08/11/22    Authorization - Visit Number 10    Authorization - Number of Visits 20    Progress Note Due on Visit 10    PT Start Time 1415    PT Stop Time 1500    PT Time Calculation (min) 45 min    Activity Tolerance Patient tolerated treatment well    Behavior During Therapy [facility] for tasks assessed/performed              Past Medical History:  Diagnosis Date   Bilateral occipital neuralgia    Complication of anesthesia    hard to wake due to  prolonged sedation   DUB (dysfunctional uterine bleeding)    Heart murmur    per pt asymtomatic, dx age 46 and told had normal echo   History of 2019 novel coronavirus disease (COVID-19) 05/2018   per pt mild symptoms that resolved   History of kidney stones    History of urethral stricture    age 26 s/p dilatation   IBS (irritable bowel syndrome)    IDA (iron deficiency anemia)    Intractable migraine with aura without status migrainosus    neruologist--- dr h. Manuella Ghazi   Thyroid nodule    per pt had biopsy of nodule approx. 2019 or 2020 , benign , and no follow up needed   Vitamin D deficiency    Past Surgical History:  Procedure Laterality Date   DILATATION & CURETTAGE/HYSTEROSCOPY WITH MYOSURE N/A 03/20/2016   Procedure: DILATATION & CURETTAGE/HYSTEROSCOPY WITH MYOSURE;  Surgeon: Servando Salina, MD;  Location: [facility];  Service: Gynecology;  Laterality: N/A;  45 min. requested   DILATATION & CURETTAGE/HYSTEROSCOPY WITH MYOSURE N/A 12/06/2020   Procedure: DILATATION &  CURETTAGE/DIAGNOSTIC HYSTEROSCOPY/ HYSTEROSCOPIC RESECTION OF ENDOMETRIAL POLYP USING MYOSURE;  Surgeon: Servando Salina, MD;  Location: [place];  Service: Gynecology;  Laterality: N/A;   EXTRACORPOREAL SHOCK WAVE LITHOTRIPSY  04/18/2013   @[facility]    EXTRACORPOREAL SHOCK WAVE LITHOTRIPSY Right 07/07/2021   Procedure: RIGHT EXTRACORPOREAL SHOCK WAVE LITHOTRIPSY (ESWL);  Surgeon: Lucas Mallow, MD;  Location: [facility];  Service: Urology;  Laterality: Right;   KNEE ARTHROSCOPY  04/24/2011   Procedure: ARTHROSCOPY KNEE;  Surgeon: Johnn Hai;  Location: [place];  Service: Orthopedics;  Laterality: Right;  /Arthroscopy with debridement, right knee   LAPAROSCOPIC APPENDECTOMY  03/21/2012   Procedure: APPENDECTOMY LAPAROSCOPIC;  Surgeon: Joyice Faster. Cornett, MD;  Location: [place];  Service: General;  Laterality: N/A;   URETHRAL STRICTURE DILATATION     age 89   Patient Active Problem List   Diagnosis Date Noted   Sacral mass 06/11/2022   Genetic testing 02/16/2022   Obesity (BMI 35.0-39.9 without comorbidity) 09/21/2021   Iron deficiency anemia 04/11/2021   Abdominal pain 03/31/2021   Traumatic ecchymosis of right lower leg 03/23/2020   Hematoma of right lower extremity 03/23/2020   Nausea 08/21/2019   Abdominal bloating 08/21/2019   History of COVID-19 06/24/2019   URI (upper respiratory  infection) 08/26/2018   Thyroid nodule 02/27/2018   Intractable migraine with aura with status migrainosus 05/14/2017   Stress 01/10/2017   DUB (dysfunctional uterine bleeding) 07/16/2016   Low back pain 04/30/2015   Fatigue 02/08/2015   Skin lesion of breast 08/06/2014   Health care maintenance 08/06/2014   Nephrolithiasis 02/11/2013   Migraine 02/11/2013   History of frequent urinary tract infections 02/11/2013   Syncope 02/11/2013   IBS (irritable bowel syndrome) 02/11/2013   Anemia 02/11/2013   Vitamin D deficiency 02/11/2013     REFERRING DIAG: 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 It has been getting progressively worse since then. She feels the pain when she sits for awhile or walks for a while. She exercises by doing dance videos and using walking pads.   PRECAUTIONS: None   SUBJECTIVE:                                                                                                                                                                                      SUBJECTIVE STATEMENT: Pt reports feeling sore after laying in MRI machine for two hours.   PAIN:  Are you having pain? Yes: NPRS scale: 1-2/10 Pain location: Left SIJ  Pain description: Achy  Aggravating factors: Walking or standing for long distances  Relieving factors: Sitting    OBJECTIVE:    VITALS: BP 140/102 SpO2 100 HR 83    DIAGNOSTIC FINDINGS:  CLINICAL DATA:  Patient having lithotripsy this morning.   EXAM: ABDOMEN - 1 VIEW   COMPARISON:  March 31, 2021 June 25, 2021   FINDINGS: There is a 7 mm stone in the right mid kidney and a 3.5 mm stone in the lower right kidney. No other renal stones identified. There is a stable phleboliths in the deep left pelvis. A sclerotic rounded region projects over the left side of the sacrum measuring 4 mm, unchanged since November 2022 and June 25, 2021 but new since 2014.   IMPRESSION: 1. Two stones in the right kidney as above. 2. The 4 mm sclerotic focus projected over the left side of the sacrum is unchanged since November 2022 but new since 2014. This may represent a bone island but is nonspecific. This finding is more medially located than expected for a ureteral stone. 3. Phlebolith in the deep left pelvis.     Electronically Signed    By: Gerome Sam III M.D.  On: 07/07/2021 10:34   PATIENT SURVEYS:  FOTO 61/100   SCREENING FOR RED FLAGS: Bowel or bladder incontinence: No Spinal tumors: No Cauda equina syndrome: No Compression fracture: No Abdominal aneurysm: No   COGNITION: Overall cognitive status: Within functional limits for tasks assessed                          SENSATION: WFL   MUSCLE LENGTH: Hamstrings: Right 90 deg; Left 90 deg Thomas test: Negative Bilateral  Ely's test: Negative Bilateral    POSTURE: decreased lumbar lordosis   PALPATION: Left PSIS    LUMBAR ROM:    AROM eval  Flexion 100%  Extension 100%  Right lateral flexion 100%  Left lateral flexion 100%  Right rotation 100%  Left rotation 100%   (Blank rows = not tested)   LOWER EXTREMITY ROM:          Active  Right 04/01/2022 Left 04/01/2022  Hip flexion 120 120  Hip extension 30 30  Hip abduction 45 45  Hip adduction 30 30  Hip internal rotation 45 45  Hip external rotation 45 45  Knee flexion 135 135  Knee extension 0 0  Ankle dorsiflexion 20 20  Ankle plantarflexion 50 50  Ankle inversion 35 35  Ankle eversion 15 15   (Blank rows = not tested)          LOWER EXTREMITY MMT:         MMT Right eval Left eval  Hip flexion 5 4+  Hip extension 5 5  Hip abduction 4+ 4+  Hip adduction 4- 4-  Hip internal rotation      Hip external rotation      Knee flexion 5 5  Knee extension 5 5  Ankle dorsiflexion 5 5  Ankle plantarflexion      Ankle inversion      Ankle eversion       (Blank rows = not tested)     LUMBAR SPECIAL TESTS:  SI Compression/distraction test: Negative, FABER test: Negative, Gaenslen's test: Negative, Trendelenburg sign: Negative, and Thomas test: Negative,    FUNCTIONAL TESTS:  Single leg stance: NT    GAIT: Distance walked: 50 ft  Assistive device utilized: None Level of assistance: Complete Independence Comments: No gait deficits noted    TODAY'S TREATMENT:                                                                                                                               DATE:   06/24/22: Therex  Recumbent Bicycle seat at 11 with resistance of 2 for 5 min Hip MMT:  -Flexion R/L 5/5  -Adduction R/L 4/4  Side Lying Hip Abduction on diagonal 1 x 10  -Pt reports that exercise is easy  Side Step Up on 6 inch step with 1 UE support 2 x 10  Squat with #20 KB 1 x 10  -Pt unable to  perform; weight is too heavy.  Squat with #15 DB 1 x 10  -min VC to keep knees behind toes    06/16/22: Therex  Recumbent Bicycle seat at 11 with resistance of 3 for 5 min Engineer, manufacturing with BUE support 2 x 10  -mod VC for position of knee behind toes Standing Quad Stretch 3 x 30 sec   06/10/22:  Therex  Recumbent Bicycle seat at 11 with resistance of 1 for 5 min Figure 4 Bridges 3 x 10  Lower Trunk Rotations 3 x 10  Loaded Arm Crunch with Arms Extended #15 DB 1 x 10  Loaded Arm Crunch with Arms Extended #10 DB 1 x 10  Hip MMT: See scores below in goal section   05/20/22: There.ex: Recumbent Bicycle seat at 11 with resistance of 5 for 5 min FOTO: 63/100  Matrix Standing Hip Abduction #55 3 x 10 on LLE   Matrix Standing Hip Abduction #70 3 x 10 on RLE  Matrix Standing Hip Adduction #55 3 x 10  -Towel placed over movement arm when doing on right side to prevent irritation to knee   Goblet Squat #20 1 x 10  -Pt reports increased tension in her low back   Standing Forward flexion stretch with straight knees 5 x 30 sec   Goblet Squat #10 2 x 10  -Pt able to perform without feeling as much lumbar tension     PATIENT EDUCATION:  Education details: form and technique for appropriate exercise and explanation of deficits and SIJ PT diagnosis  Person educated: Patient Education method: Explanation, Demonstration, Verbal cues, and Handouts Education comprehension: verbalized understanding, returned demonstration, and verbal cues required   HOME  EXERCISE PROGRAM: Access Code: [email or web address] URL: [email or web address] Date: 06/24/2022 Prepared by: Ellin Goodie  Exercises - Supine Lower Trunk Rotation  - 7 x weekly - 3 sets - 10 reps - Supine Single Knee to Chest Stretch  - 1 x daily - 3 sets - 10 reps - 3 sec  hold - Supine Butterfly Groin Stretch  - 1 x daily - 3 reps - 30 sec  hold - Standing Shoulder Horizontal Abduction with Resistance  - 3 x weekly - 3 sets - 10 reps - Ab Prep  - 3 x weekly - 3 sets - 10 reps - Lateral Step Up with Counter Support  - 3 x weekly - 3 sets - 10 reps - Squat  - 3 x weekly - 3 sets - 10 reps   ASSESSMENT:   CLINICAL IMPRESSION:    Patient limited by generalized soreness throughout session from prior MRI procedure. Despite an improvement in her LE strength, pt demonstrates a decrease in her perception of her functional ability based on FOTO. She has follow up appointment with neurologist on this upcoming Tuesday to discuss lumbar and hip MRI. Based on diagnosis and prognosis of MRI, PT recommends scheduling more appointments and cancelling if prognosis for PT is limited based on her condition. She will continue to benefit from skilled PT to reduce left SIJ joint pain and to increase hip strength to stand and walk for prolonged periods of time to carry out tasks of her job.   OBJECTIVE IMPAIRMENTS: decreased mobility, decreased strength and pain.    ACTIVITY LIMITATIONS: carrying, bending, squatting, transfers, and locomotion level   PARTICIPATION LIMITATIONS: occupation   PERSONAL FACTORS: Past/current experiences, Time since onset of injury/illness/exacerbation, and 1-2 comorbidities: occipital neuralgia and h/o brother with ankylosing spondylosis are also affecting patient's functional outcome.  REHAB POTENTIAL: Good   CLINICAL DECISION MAKING: Stable/uncomplicated   EVALUATION COMPLEXITY: Low     GOALS: Goals reviewed with patient? No   SHORT TERM GOALS: Target date:  04/15/2022   Pt will be independent with HEP in order to improve strength and balance in order to decrease fall risk and improve function at home and work. Baseline: Able to perform independently  Goal status: Achieved    LONG TERM GOALS: Target date: 06/10/2022   Patient will have improved function and activity level as evidenced by an increase in FOTO score by 10 points or more.  Baseline: 60/100 with target of 71 05/20/22: 63/100 06/24/22: 61/100 Goal status: Ongoing    2.  Patient will improve hip strength by 1/3 MMT grade for improved spinal stability and reduction of hip pain.  Baseline: Hip Add R/L 4-/4- Hip Flex L 4+, Hip Abd R/L 4+/4+  06/10/22: Hip Add R/L 4/4 Hip Flex L 4+, Hip Abd R/L 5/5  06/24/22: Hip Flex R/L 5/5, Hip Add R/L 4/4+ Goal status: Achieved      PLAN:   PT FREQUENCY: 1-2x/week   PT DURATION: 10 weeks   PLANNED INTERVENTIONS: Therapeutic exercises, Gait training, Joint mobilization, Joint manipulation, Electrical stimulation, Spinal manipulation, Spinal mobilization, Cryotherapy, Moist heat, Manual therapy, and Re-evaluation.   PLAN FOR NEXT SESSION: Continue to progress abdominal and glute strengthening exercises: squats and weighted step ups   Bradly Chris PT, DPT  Physical Therapist- [facility]  06/24/2022, 2:21 PM

## 2022-07-07 ENCOUNTER — Ambulatory Visit: Payer: BC Managed Care – PPO | Attending: Internal Medicine | Admitting: Physical Therapy

## 2022-07-07 ENCOUNTER — Encounter: Payer: Self-pay | Admitting: Physical Therapy

## 2022-07-07 DIAGNOSIS — M5459 Other low back pain: Secondary | ICD-10-CM | POA: Diagnosis present

## 2022-07-07 DIAGNOSIS — M533 Sacrococcygeal disorders, not elsewhere classified: Secondary | ICD-10-CM | POA: Insufficient documentation

## 2022-07-07 DIAGNOSIS — G8929 Other chronic pain: Secondary | ICD-10-CM | POA: Insufficient documentation

## 2022-07-07 NOTE — Therapy (Signed)
OUTPATIENT PHYSICAL THERAPY TREATMENT NOTE  Dates of Reporting: 04/01/22-06/10/22  Patient Name: Tina Fleming MRN: [id number] DOB:[birth year], 42 y.o., female Today's Date: 07/07/2022  PCP: Dr. Einar Pheasant  REFERRING PROVIDER: Dr. Einar Pheasant   END OF SESSION:   PT End of Session - 07/07/22 1334     Visit Number 11    Number of Visits 20    Date for PT Re-Evaluation 08/11/22    Authorization Type [employer]    Authorization Time Period 06/12/22-08/11/22    Authorization - Visit Number 11    Authorization - Number of Visits 20    Progress Note Due on Visit 20    PT Start Time 1330    PT Stop Time 1415    PT Time Calculation (min) 45 min    Activity Tolerance Patient tolerated treatment well    Behavior During Therapy [facility] for tasks assessed/performed              Past Medical History:  Diagnosis Date   Bilateral occipital neuralgia    Complication of anesthesia    hard to wake due to  prolonged sedation   DUB (dysfunctional uterine bleeding)    Heart murmur    per pt asymtomatic, dx age 29 and told had normal echo   History of 2019 novel coronavirus disease (COVID-19) 05/2018   per pt mild symptoms that resolved   History of kidney stones    History of urethral stricture    age 42 s/p dilatation   IBS (irritable bowel syndrome)    IDA (iron deficiency anemia)    Intractable migraine with aura without status migrainosus    neruologist--- dr h. Manuella Ghazi   Thyroid nodule    per pt had biopsy of nodule approx. 2019 or 2020 , benign , and no follow up needed   Vitamin D deficiency    Past Surgical History:  Procedure Laterality Date   DILATATION & CURETTAGE/HYSTEROSCOPY WITH MYOSURE N/A 03/20/2016   Procedure: DILATATION & CURETTAGE/HYSTEROSCOPY WITH MYOSURE;  Surgeon: Servando Salina, MD;  Location: [facility];  Service: Gynecology;  Laterality: N/A;  45 min. requested   DILATATION & CURETTAGE/HYSTEROSCOPY WITH MYOSURE N/A 12/06/2020   Procedure: DILATATION &  CURETTAGE/DIAGNOSTIC HYSTEROSCOPY/ HYSTEROSCOPIC RESECTION OF ENDOMETRIAL POLYP USING MYOSURE;  Surgeon: Servando Salina, MD;  Location: [place];  Service: Gynecology;  Laterality: N/A;   EXTRACORPOREAL SHOCK WAVE LITHOTRIPSY  04/18/2013   @[facility]$    EXTRACORPOREAL SHOCK WAVE LITHOTRIPSY Right 07/07/2021   Procedure: RIGHT EXTRACORPOREAL SHOCK WAVE LITHOTRIPSY (ESWL);  Surgeon: Lucas Mallow, MD;  Location: [facility];  Service: Urology;  Laterality: Right;   KNEE ARTHROSCOPY  04/24/2011   Procedure: ARTHROSCOPY KNEE;  Surgeon: Johnn Hai;  Location: [place];  Service: Orthopedics;  Laterality: Right;  /Arthroscopy with debridement, right knee   LAPAROSCOPIC APPENDECTOMY  03/21/2012   Procedure: APPENDECTOMY LAPAROSCOPIC;  Surgeon: Joyice Faster. Cornett, MD;  Location: [place];  Service: General;  Laterality: N/A;   URETHRAL STRICTURE DILATATION     age 89   Patient Active Problem List   Diagnosis Date Noted   Sacral mass 06/11/2022   Genetic testing 02/16/2022   Obesity (BMI 35.0-39.9 without comorbidity) 09/21/2021   Iron deficiency anemia 04/11/2021   Abdominal pain 03/31/2021   Traumatic ecchymosis of right lower leg 03/23/2020   Hematoma of right lower extremity 03/23/2020   Nausea 08/21/2019   Abdominal bloating 08/21/2019   History of COVID-19 06/24/2019   URI (upper respiratory  infection) 08/26/2018   Thyroid nodule 02/27/2018   Intractable migraine with aura with status migrainosus 05/14/2017   Stress 01/10/2017   DUB (dysfunctional uterine bleeding) 07/16/2016   Low back pain 04/30/2015   Fatigue 02/08/2015   Skin lesion of breast 08/06/2014   Health care maintenance 08/06/2014   Nephrolithiasis 02/11/2013   Migraine 02/11/2013   History of frequent urinary tract infections 02/11/2013   Syncope 02/11/2013   IBS (irritable bowel syndrome) 02/11/2013   Anemia 02/11/2013   Vitamin D deficiency 02/11/2013     REFERRING DIAG: 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 It has been getting progressively worse since then. She feels the pain when she sits for awhile or walks for a while. She exercises by doing dance videos and using walking pads.   PRECAUTIONS: None   SUBJECTIVE:                                                                                                                                                                                      SUBJECTIVE STATEMENT: Pt reports receiving and MRI that showed two herniated which explained radiating pain down left hip.   PAIN:  Are you having pain? Yes: NPRS scale: 1-2/10 Pain location: Left SIJ  Pain description: Achy  Aggravating factors: Walking or standing for long distances  Relieving factors: Sitting    OBJECTIVE:    VITALS: BP 140/102 SpO2 100 HR 83    DIAGNOSTIC FINDINGS:  CLINICAL DATA:  Patient having lithotripsy this morning.   EXAM: ABDOMEN - 1 VIEW   COMPARISON:  March 31, 2021 June 25, 2021   FINDINGS: There is a 7 mm stone in the right mid kidney and a 3.5 mm stone in the lower right kidney. No other renal stones identified. There is a stable phleboliths in the deep left pelvis. A sclerotic rounded region projects over the left side of the sacrum measuring 4 mm, unchanged since November 2022 and June 25, 2021 but new since 2014.   IMPRESSION: 1. Two stones in the right kidney as above. 2. The 4 mm sclerotic focus projected over the left side of the sacrum is unchanged since November 2022 but new since 2014. This may represent a bone island but is nonspecific. This finding is more medially located than expected for a ureteral stone. 3. Phlebolith in the deep left  pelvis.     Electronically Signed   By: Shanon Brow  Jimmye Norman III M.D.   On: 07/07/2021 10:34   PATIENT SURVEYS:  FOTO 61/100   SCREENING FOR RED FLAGS: Bowel or bladder incontinence: No Spinal tumors: No Cauda equina syndrome: No Compression fracture: No Abdominal aneurysm: No   COGNITION: Overall cognitive status: Within functional limits for tasks assessed                          SENSATION: WFL   MUSCLE LENGTH: Hamstrings: Right 90 deg; Left 90 deg Thomas test: Negative Bilateral  Ely's test: Negative Bilateral    POSTURE: decreased lumbar lordosis   PALPATION: Left PSIS    LUMBAR ROM:    AROM eval  Flexion 100%  Extension 100%  Right lateral flexion 100%  Left lateral flexion 100%  Right rotation 100%  Left rotation 100%   (Blank rows = not tested)   LOWER EXTREMITY ROM:          Active  Right 04/01/2022 Left 04/01/2022  Hip flexion 120 120  Hip extension 30 30  Hip abduction 45 45  Hip adduction 30 30  Hip internal rotation 45 45  Hip external rotation 45 45  Knee flexion 135 135  Knee extension 0 0  Ankle dorsiflexion 20 20  Ankle plantarflexion 50 50  Ankle inversion 35 35  Ankle eversion 15 15   (Blank rows = not tested)          LOWER EXTREMITY MMT:         MMT Right eval Left eval  Hip flexion 5 4+  Hip extension 5 5  Hip abduction 4+ 4+  Hip adduction 4- 4-  Hip internal rotation      Hip external rotation      Knee flexion 5 5  Knee extension 5 5  Ankle dorsiflexion 5 5  Ankle plantarflexion      Ankle inversion      Ankle eversion       (Blank rows = not tested)     LUMBAR SPECIAL TESTS:  SI Compression/distraction test: Negative, FABER test: Negative, Gaenslen's test: Negative, Trendelenburg sign: Negative, and Thomas test: Negative,    FUNCTIONAL TESTS:  Single leg stance: NT    GAIT: Distance walked: 50 ft  Assistive device utilized: None Level of assistance: Complete Independence Comments: No gait deficits  noted    TODAY'S TREATMENT:                                                                                                                              DATE:   07/07/22: Supine Bridges 3 x 10 Standing Lumbar Extension x 10  Side Lying Hip Abduction 3 x 10  Prone Hip Extension   06/24/22: Therex  Recumbent Bicycle seat at 11 with resistance of 2 for 5 min Hip MMT:  -Flexion R/L 5/5  -Adduction R/L 4/4  Side Lying Hip Abduction on diagonal 1 x 10  -Pt reports that  exercise is easy  Side Step Up on 6 inch step with 1 UE support 2 x 10  Squat with #20 KB 1 x 10  -Pt unable to perform; weight is too heavy.  Squat with #15 DB 1 x 10  -min VC to keep knees behind toes    06/16/22: Therex  Recumbent Bicycle seat at 11 with resistance of 3 for 5 min Science writer with BUE support 2 x 10  -mod VC for position of knee behind toes Standing Quad Stretch 3 x 30 sec   06/10/22:  Therex  Recumbent Bicycle seat at 11 with resistance of 1 for 5 min Figure 4 Bridges 3 x 10  Lower Trunk Rotations 3 x 10  Loaded Arm Crunch with Arms Extended #15 DB 1 x 10  Loaded Arm Crunch with Arms Extended #10 DB 1 x 10  Hip MMT: See scores below in goal section     PATIENT EDUCATION:  Education details: form and technique for appropriate exercise and explanation of deficits and SIJ PT diagnosis  Person educated: Patient Education method: Explanation, Demonstration, Verbal cues, and Handouts Education comprehension: verbalized understanding, returned demonstration, and verbal cues required   HOME EXERCISE PROGRAM: Access Code: [email or web address] URL: [email or web address] Date: 06/24/2022 Prepared by: Bradly Chris  Exercises - Supine Lower Trunk Rotation  - 7 x weekly - 3 sets - 10 reps - Supine Single Knee to Chest Stretch  - 1 x daily - 3 sets - 10 reps - 3 sec  hold - Supine Butterfly Groin Stretch  - 1 x daily - 3 reps - 30 sec  hold - Standing Shoulder Horizontal Abduction  with Resistance  - 3 x weekly - 3 sets - 10 reps - Ab Prep  - 3 x weekly - 3 sets - 10 reps - Lateral Step Up with Counter Support  - 3 x weekly - 3 sets - 10 reps - Squat  - 3 x weekly - 3 sets - 10 reps   ASSESSMENT:   CLINICAL IMPRESSION:   Pt shows preference for lumbar extension exercises. Revised HEP to include all lumbar extension exercises and to avoid lumbar flexion and rotation exercises. She was able to perform all exercises without an increase in her pain. She will continue to benefit from skilled PT to reduce left SIJ joint pain and to increase hip strength to stand and walk for prolonged periods of time to carry out tasks of her job.    OBJECTIVE IMPAIRMENTS: decreased mobility, decreased strength and pain.    ACTIVITY LIMITATIONS: carrying, bending, squatting, transfers, and locomotion level   PARTICIPATION LIMITATIONS: occupation   PERSONAL FACTORS: Past/current experiences, Time since onset of injury/illness/exacerbation, and 1-2 comorbidities: occipital neuralgia and h/o brother with ankylosing spondylosis are also affecting patient's functional outcome.    REHAB POTENTIAL: Good   CLINICAL DECISION MAKING: Stable/uncomplicated   EVALUATION COMPLEXITY: Low     GOALS: Goals reviewed with patient? No   SHORT TERM GOALS: Target date: 04/15/2022   Pt will be independent with HEP in order to improve strength and balance in order to decrease fall risk and improve function at home and work. Baseline: Able to perform independently  Goal status: Achieved    LONG TERM GOALS: Target date: 06/10/2022   Patient will have improved function and activity level as evidenced by an increase in FOTO score by 10 points or more.  Baseline: 60/100 with target of 71 05/20/22: 63/100 06/24/22: 61/100 Goal status: Ongoing    2.  Patient will improve hip strength by 1/3 MMT grade for improved spinal stability and reduction of hip pain.  Baseline: Hip Add R/L 4-/4- Hip Flex L 4+, Hip Abd  R/L 4+/4+  06/10/22: Hip Add R/L 4/4 Hip Flex L 4+, Hip Abd R/L 5/5  06/24/22: Hip Flex R/L 5/5, Hip Add R/L 4/4+ Goal status: Achieved      PLAN:   PT FREQUENCY: 1-2x/week   PT DURATION: 10 weeks   PLANNED INTERVENTIONS: Therapeutic exercises, Gait training, Joint mobilization, Joint manipulation, Electrical stimulation, Spinal manipulation, Spinal mobilization, Cryotherapy, Moist heat, Manual therapy, and Re-evaluation.   PLAN FOR NEXT SESSION: Continue with lumbar extension exercises: Prone press ups, Bird Dogs, etc   Bradly Chris PT, DPT  Physical Therapist- [facility]  07/07/2022, 1:35 PM

## 2022-07-14 ENCOUNTER — Ambulatory Visit: Payer: BC Managed Care – PPO | Admitting: Physical Therapy

## 2022-07-14 ENCOUNTER — Encounter: Payer: Self-pay | Admitting: Physical Therapy

## 2022-07-14 DIAGNOSIS — M5459 Other low back pain: Secondary | ICD-10-CM

## 2022-07-14 NOTE — Therapy (Signed)
OUTPATIENT PHYSICAL THERAPY TREATMENT NOTE  Dates of Reporting: 04/01/22-06/10/22  Patient Name: Tina Fleming MRN: [id number] DOB:10-24-1980, 42 y.o., female Today's Date: 07/14/2022  PCP: Dr. Einar Pheasant  REFERRING PROVIDER: Dr. Einar Pheasant   END OF SESSION:   PT End of Session - 07/14/22 1024     Visit Number 12    Number of Visits 20    Date for PT Re-Evaluation 08/11/22    Authorization Type [employer]    Authorization Time Period 06/12/22-08/11/22    Authorization - Visit Number 12    Authorization - Number of Visits 20    Progress Note Due on Visit 20    PT Start Time 1020    PT Stop Time 1100    PT Time Calculation (min) 40 min    Activity Tolerance Patient tolerated treatment well    Behavior During Therapy [facility] for tasks assessed/performed              Past Medical History:  Diagnosis Date   Bilateral occipital neuralgia    Complication of anesthesia    hard to wake due to  prolonged sedation   DUB (dysfunctional uterine bleeding)    Heart murmur    per pt asymtomatic, dx age 50 and told had normal echo   History of 2019 novel coronavirus disease (COVID-19) 05/2018   per pt mild symptoms that resolved   History of kidney stones    History of urethral stricture    age 60 s/p dilatation   IBS (irritable bowel syndrome)    IDA (iron deficiency anemia)    Intractable migraine with aura without status migrainosus    neruologist--- dr h. Manuella Ghazi   Thyroid nodule    per pt had biopsy of nodule approx. 2019 or 2020 , benign , and no follow up needed   Vitamin D deficiency    Past Surgical History:  Procedure Laterality Date   DILATATION & CURETTAGE/HYSTEROSCOPY WITH MYOSURE N/A 03/20/2016   Procedure: DILATATION & CURETTAGE/HYSTEROSCOPY WITH MYOSURE;  Surgeon: Servando Salina, MD;  Location: [facility];  Service: Gynecology;  Laterality: N/A;  45 min. requested   DILATATION & CURETTAGE/HYSTEROSCOPY WITH MYOSURE N/A 12/06/2020   Procedure: DILATATION &  CURETTAGE/DIAGNOSTIC HYSTEROSCOPY/ HYSTEROSCOPIC RESECTION OF ENDOMETRIAL POLYP USING MYOSURE;  Surgeon: Servando Salina, MD;  Location: [place];  Service: Gynecology;  Laterality: N/A;   EXTRACORPOREAL SHOCK WAVE LITHOTRIPSY  04/18/2013   @[facility]$    EXTRACORPOREAL SHOCK WAVE LITHOTRIPSY Right 07/07/2021   Procedure: RIGHT EXTRACORPOREAL SHOCK WAVE LITHOTRIPSY (ESWL);  Surgeon: Lucas Mallow, MD;  Location: [facility];  Service: Urology;  Laterality: Right;   KNEE ARTHROSCOPY  04/24/2011   Procedure: ARTHROSCOPY KNEE;  Surgeon: Johnn Hai;  Location: [place] [facility];  Service: Orthopedics;  Laterality: Right;  /Arthroscopy with debridement, right knee   LAPAROSCOPIC APPENDECTOMY  03/21/2012   Procedure: APPENDECTOMY LAPAROSCOPIC;  Surgeon: Joyice Faster. Cornett, MD;  Location: [place];  Service: General;  Laterality: N/A;   URETHRAL STRICTURE DILATATION     age 29   Patient Active Problem List   Diagnosis Date Noted   Sacral mass 06/11/2022   Genetic testing 02/16/2022   Obesity (BMI 35.0-39.9 without comorbidity) 09/21/2021   Iron deficiency anemia 04/11/2021   Abdominal pain 03/31/2021   Traumatic ecchymosis of right lower leg 03/23/2020   Hematoma of right lower extremity 03/23/2020   Nausea 08/21/2019   Abdominal bloating 08/21/2019   History of COVID-19 06/24/2019   URI (upper respiratory  infection) 08/26/2018   Thyroid nodule 02/27/2018   Intractable migraine with aura with status migrainosus 05/14/2017   Stress 01/10/2017   DUB (dysfunctional uterine bleeding) 07/16/2016   Low back pain 04/30/2015   Fatigue 02/08/2015   Skin lesion of breast 08/06/2014   Health care maintenance 08/06/2014   Nephrolithiasis 02/11/2013   Migraine 02/11/2013   History of frequent urinary tract infections 02/11/2013   Syncope 02/11/2013   IBS (irritable bowel syndrome) 02/11/2013   Anemia 02/11/2013   Vitamin D deficiency 02/11/2013     REFERRING DIAG: 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 It has been getting progressively worse since then. She feels the pain when she sits for awhile or walks for a while. She exercises by doing dance videos and using walking pads.   PRECAUTIONS: None   SUBJECTIVE:                                                                                                                                                                                      SUBJECTIVE STATEMENT: Pt reports being denied for lumbar injection by insurance. She continues to feel low back pain and she is not sure what is causing pain. She describes feeling increased left quad weakness when walking around [place] in her house.   PAIN:  Are you having pain? No   OBJECTIVE:    VITALS: BP 140/102 SpO2 100 HR 83    DIAGNOSTIC FINDINGS:  CLINICAL DATA:  Patient having lithotripsy this morning.   EXAM: ABDOMEN - 1 VIEW   COMPARISON:  March 31, 2021 June 25, 2021   FINDINGS: There is a 7 mm stone in the right mid kidney and a 3.5 mm stone in the lower right kidney. No other renal stones identified. There is a stable phleboliths in the deep left pelvis. A sclerotic rounded region projects over the left side of the sacrum measuring 4 mm, unchanged since November 2022 and June 25, 2021 but new since 2014.   IMPRESSION: 1. Two stones in the right kidney as above. 2. The 4 mm sclerotic focus projected over the left side of the sacrum is unchanged since November 2022 but new since 2014. This may represent a bone island but is nonspecific. This finding is more medially located than expected for a ureteral stone. 3. Phlebolith in the deep left pelvis.     Electronically Signed   By: Dorise Bullion III M.D.    On: 07/07/2021  10:34   PATIENT SURVEYS:  FOTO 61/100   SCREENING FOR RED FLAGS: Bowel or bladder incontinence: No Spinal tumors: No Cauda equina syndrome: No Compression fracture: No Abdominal aneurysm: No   COGNITION: Overall cognitive status: Within functional limits for tasks assessed                          SENSATION: WFL   MUSCLE LENGTH: Hamstrings: Right 90 deg; Left 90 deg Thomas test: Negative Bilateral  Ely's test: Negative Bilateral    POSTURE: decreased lumbar lordosis   PALPATION: Left PSIS    LUMBAR ROM:    AROM eval  Flexion 100%  Extension 100%  Right lateral flexion 100%  Left lateral flexion 100%  Right rotation 100%  Left rotation 100%   (Blank rows = not tested)   LOWER EXTREMITY ROM:          Active  Right 04/01/2022 Left 04/01/2022  Hip flexion 120 120  Hip extension 30 30  Hip abduction 45 45  Hip adduction 30 30  Hip internal rotation 45 45  Hip external rotation 45 45  Knee flexion 135 135  Knee extension 0 0  Ankle dorsiflexion 20 20  Ankle plantarflexion 50 50  Ankle inversion 35 35  Ankle eversion 15 15   (Blank rows = not tested)          LOWER EXTREMITY MMT:         MMT Right eval Left eval  Hip flexion 5 4+  Hip extension 5 5  Hip abduction 4+ 4+  Hip adduction 4- 4-  Hip internal rotation      Hip external rotation      Knee flexion 5 5  Knee extension 5 5  Ankle dorsiflexion 5 5  Ankle plantarflexion      Ankle inversion      Ankle eversion       (Blank rows = not tested)     LUMBAR SPECIAL TESTS:  SI Compression/distraction test: Negative, FABER test: Negative, Gaenslen's test: Negative, Trendelenburg sign: Negative, and Thomas test: Negative,    FUNCTIONAL TESTS:  Single leg stance: NT    GAIT: Distance walked: 50 ft  Assistive device utilized: None Level of assistance: Complete Independence Comments: No gait deficits noted    TODAY'S TREATMENT:                                                                                                                               DATE:   07/14/22: TM 1.5 mph for 5 min  Omega Knee Extension on LLE #15 3 x 10  Omega HS Curl on LLE #15 3 x 10  Side Lying Hip Abduction 3 x 10   07/07/22: Supine Bridges 3 x 10 Standing Lumbar Extension x 10  Side Lying Hip Abduction 3 x 10  Prone Hip Extension   06/24/22: Therex  Recumbent Bicycle seat at 11 with  resistance of 2 for 5 min Hip MMT:  -Flexion R/L 5/5  -Adduction R/L 4/4  Side Lying Hip Abduction on diagonal 1 x 10  -Pt reports that exercise is easy  Side Step Up on 6 inch step with 1 UE support 2 x 10  Squat with #20 KB 1 x 10  -Pt unable to perform; weight is too heavy.  Squat with #15 DB 1 x 10  -min VC to keep knees behind toes    06/16/22: Therex  Recumbent Bicycle seat at 11 with resistance of 3 for 5 min Science writer with BUE support 2 x 10  -mod VC for position of knee behind toes Standing Quad Stretch 3 x 30 sec   06/10/22:  Therex  Recumbent Bicycle seat at 11 with resistance of 1 for 5 min Figure 4 Bridges 3 x 10  Lower Trunk Rotations 3 x 10  Loaded Arm Crunch with Arms Extended #15 DB 1 x 10  Loaded Arm Crunch with Arms Extended #10 DB 1 x 10  Hip MMT: See scores below in goal section     PATIENT EDUCATION:  Education details: form and technique for appropriate exercise and explanation of deficits and SIJ PT diagnosis  Person educated: Patient Education method: Explanation, Demonstration, Verbal cues, and Handouts Education comprehension: verbalized understanding, returned demonstration, and verbal cues required   HOME EXERCISE PROGRAM: Access Code: [email or web address] URL: [email or web address] Date: 06/24/2022 Prepared by: Bradly Chris  Exercises - Supine Lower Trunk Rotation  - 7 x weekly - 3 sets - 10 reps - Supine Single Knee to Chest Stretch  - 1 x daily - 3 sets - 10 reps - 3 sec  hold - Supine Butterfly Groin Stretch  - 1 x  daily - 3 reps - 30 sec  hold - Standing Shoulder Horizontal Abduction with Resistance  - 3 x weekly - 3 sets - 10 reps - Ab Prep  - 3 x weekly - 3 sets - 10 reps - Lateral Step Up with Counter Support  - 3 x weekly - 3 sets - 10 reps - Squat  - 3 x weekly - 3 sets - 10 reps   ASSESSMENT:   CLINICAL IMPRESSION:   Pt able to perform all exercises without an increase in her low back pain. Focused on left quad strengthening because per subjective pt feels this muscle is weaker when walking. Continued with extension based exercises to avoid provocation of symptoms. She will continue to benefit from skilled PT to reduce left SIJ joint pain and to increase hip strength to stand and walk for prolonged periods of time to carry out tasks of her job.  OBJECTIVE IMPAIRMENTS: decreased mobility, decreased strength and pain.    ACTIVITY LIMITATIONS: carrying, bending, squatting, transfers, and locomotion level   PARTICIPATION LIMITATIONS: occupation   PERSONAL FACTORS: Past/current experiences, Time since onset of injury/illness/exacerbation, and 1-2 comorbidities: occipital neuralgia and h/o brother with ankylosing spondylosis are also affecting patient's functional outcome.    REHAB POTENTIAL: Good   CLINICAL DECISION MAKING: Stable/uncomplicated   EVALUATION COMPLEXITY: Low     GOALS: Goals reviewed with patient? No   SHORT TERM GOALS: Target date: 04/15/2022   Pt will be independent with HEP in order to improve strength and balance in order to decrease fall risk and improve function at home and work. Baseline: Able to perform independently  Goal status: Achieved    LONG TERM GOALS: Target date: 06/10/2022   Patient will have improved function and activity  level as evidenced by an increase in FOTO score by 10 points or more.  Baseline: 60/100 with target of 71 05/20/22: 63/100 06/24/22: 61/100 Goal status: Ongoing    2.  Patient will improve hip strength by 1/3 MMT grade for improved  spinal stability and reduction of hip pain.  Baseline: Hip Add R/L 4-/4- Hip Flex L 4+, Hip Abd R/L 4+/4+  06/10/22: Hip Add R/L 4/4 Hip Flex L 4+, Hip Abd R/L 5/5  06/24/22: Hip Flex R/L 5/5, Hip Add R/L 4/4+ Goal status: Achieved      PLAN:   PT FREQUENCY: 1-2x/week   PT DURATION: 10 weeks   PLANNED INTERVENTIONS: Therapeutic exercises, Gait training, Joint mobilization, Joint manipulation, Electrical stimulation, Spinal manipulation, Spinal mobilization, Cryotherapy, Moist heat, Manual therapy, and Re-evaluation.   PLAN FOR NEXT SESSION: Continue with lumbar extension exercises: Prone press ups, Bird Dogs, etc   Bradly Chris PT, DPT  Physical Therapist- [facility]  07/14/2022, 10:25 AM

## 2022-07-22 ENCOUNTER — Ambulatory Visit: Payer: BC Managed Care – PPO | Admitting: Physical Therapy

## 2022-07-22 DIAGNOSIS — M5459 Other low back pain: Secondary | ICD-10-CM

## 2022-07-22 NOTE — Therapy (Signed)
OUTPATIENT PHYSICAL THERAPY TREATMENT NOTE  Dates of Reporting: 04/01/22-06/10/22  Patient Name: Tina Fleming MRN: [id number] DOB:December 07, 1980, 42 y.o., female Today's Date: 07/22/2022  PCP: Dr. Einar Pheasant  REFERRING PROVIDER: Dr. Einar Pheasant   END OF SESSION:   PT End of Session - 07/22/22 1014     Visit Number 13    Number of Visits 20    Date for PT Re-Evaluation 08/11/22    Authorization Type BCBS    Authorization Time Period 06/12/22-08/11/22    Authorization - Visit Number 13    Authorization - Number of Visits 20    Progress Note Due on Visit 20    PT Start Time [id number]    PT Stop Time 1100    PT Time Calculation (min) 45 min    Activity Tolerance Patient tolerated treatment well    Behavior During Therapy [facility] for tasks assessed/performed               Past Medical History:  Diagnosis Date   Bilateral occipital neuralgia    Complication of anesthesia    hard to wake due to  prolonged sedation   DUB (dysfunctional uterine bleeding)    Heart murmur    per pt asymtomatic, dx age 48 and told had normal echo   History of 2019 novel coronavirus disease (COVID-19) 05/2018   per pt mild symptoms that resolved   History of kidney stones    History of urethral stricture    age 33 s/p dilatation   IBS (irritable bowel syndrome)    IDA (iron deficiency anemia)    Intractable migraine with aura without status migrainosus    neruologist--- dr h. Manuella Ghazi   Thyroid nodule    per pt had biopsy of nodule approx. 2019 or 2020 , benign , and no follow up needed   Vitamin D deficiency    Past Surgical History:  Procedure Laterality Date   DILATATION & CURETTAGE/HYSTEROSCOPY WITH MYOSURE N/A 03/20/2016   Procedure: DILATATION & CURETTAGE/HYSTEROSCOPY WITH MYOSURE;  Surgeon: Servando Salina, MD;  Location: [facility];  Service: Gynecology;  Laterality: N/A;  45 min. requested   DILATATION & CURETTAGE/HYSTEROSCOPY WITH MYOSURE N/A 12/06/2020   Procedure: DILATATION &  CURETTAGE/DIAGNOSTIC HYSTEROSCOPY/ HYSTEROSCOPIC RESECTION OF ENDOMETRIAL POLYP USING MYOSURE;  Surgeon: Servando Salina, MD;  Location: [place];  Service: Gynecology;  Laterality: N/A;   EXTRACORPOREAL SHOCK WAVE LITHOTRIPSY  04/18/2013   '@[facility]'$    EXTRACORPOREAL SHOCK WAVE LITHOTRIPSY Right 07/07/2021   Procedure: RIGHT EXTRACORPOREAL SHOCK WAVE LITHOTRIPSY (ESWL);  Surgeon: Lucas Mallow, MD;  Location: [facility];  Service: Urology;  Laterality: Right;   KNEE ARTHROSCOPY  04/24/2011   Procedure: ARTHROSCOPY KNEE;  Surgeon: Johnn Hai;  Location: [place];  Service: Orthopedics;  Laterality: Right;  /Arthroscopy with debridement, right knee   LAPAROSCOPIC APPENDECTOMY  03/21/2012   Procedure: APPENDECTOMY LAPAROSCOPIC;  Surgeon: Joyice Faster. Cornett, MD;  Location: [place];  Service: General;  Laterality: N/A;   URETHRAL STRICTURE DILATATION     age 39   Patient Active Problem List   Diagnosis Date Noted   Sacral mass 06/11/2022   Genetic testing 02/16/2022   Obesity (BMI 35.0-39.9 without comorbidity) 09/21/2021   Iron deficiency anemia 04/11/2021   Abdominal pain 03/31/2021   Traumatic ecchymosis of right lower leg 03/23/2020   Hematoma of right lower extremity 03/23/2020   Nausea 08/21/2019   Abdominal bloating 08/21/2019   History of COVID-19 06/24/2019   URI (upper  respiratory infection) 08/26/2018   Thyroid nodule 02/27/2018   Intractable migraine with aura with status migrainosus 05/14/2017   Stress 01/10/2017   DUB (dysfunctional uterine bleeding) 07/16/2016   Low back pain 04/30/2015   Fatigue 02/08/2015   Skin lesion of breast 08/06/2014   Health care maintenance 08/06/2014   Nephrolithiasis 02/11/2013   Migraine 02/11/2013   History of frequent urinary tract infections 02/11/2013   Syncope 02/11/2013   IBS (irritable bowel syndrome) 02/11/2013   Anemia 02/11/2013   Vitamin D deficiency 02/11/2013     REFERRING DIAG: 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 It has been getting progressively worse since then. She feels the pain when she sits for awhile or walks for a while. She exercises by doing dance videos and using walking pads.   PRECAUTIONS: None   SUBJECTIVE:                                                                                                                                                                                      SUBJECTIVE STATEMENT: Pt reports that she probably overdid walking around her [place] and then walking around hospital and logging like 10,000 steps.   PAIN:  Are you having pain? No   OBJECTIVE:    VITALS: BP 140/102 SpO2 100 HR 83    DIAGNOSTIC FINDINGS:  CLINICAL DATA:  Patient having lithotripsy this morning.   EXAM: ABDOMEN - 1 VIEW   COMPARISON:  March 31, 2021 June 25, 2021   FINDINGS: There is a 7 mm stone in the right mid kidney and a 3.5 mm stone in the lower right kidney. No other renal stones identified. There is a stable phleboliths in the deep left pelvis. A sclerotic rounded region projects over the left side of the sacrum measuring 4 mm, unchanged since November 2022 and June 25, 2021 but new since 2014.   IMPRESSION: 1. Two stones in the right kidney as above. 2. The 4 mm sclerotic focus projected over the left side of the sacrum is unchanged since November 2022 but new since 2014. This may represent a bone island but is nonspecific. This finding is more medially located than expected for a ureteral stone. 3. Phlebolith in the deep left pelvis.     Electronically Signed   By: Dorise Bullion III M.D.   On: 07/07/2021 10:34   PATIENT SURVEYS:  FOTO 61/100   SCREENING FOR RED FLAGS: Bowel or bladder  incontinence:  No Spinal tumors: No Cauda equina syndrome: No Compression fracture: No Abdominal aneurysm: No   COGNITION: Overall cognitive status: Within functional limits for tasks assessed                          SENSATION: WFL   MUSCLE LENGTH: Hamstrings: Right 90 deg; Left 90 deg Thomas test: Negative Bilateral  Ely's test: Negative Bilateral    POSTURE: decreased lumbar lordosis   PALPATION: Left PSIS    LUMBAR ROM:    AROM eval  Flexion 100%  Extension 100%  Right lateral flexion 100%  Left lateral flexion 100%  Right rotation 100%  Left rotation 100%   (Blank rows = not tested)   LOWER EXTREMITY ROM:          Active  Right 04/01/2022 Left 04/01/2022  Hip flexion 120 120  Hip extension 30 30  Hip abduction 45 45  Hip adduction 30 30  Hip internal rotation 45 45  Hip external rotation 45 45  Knee flexion 135 135  Knee extension 0 0  Ankle dorsiflexion 20 20  Ankle plantarflexion 50 50  Ankle inversion 35 35  Ankle eversion 15 15   (Blank rows = not tested)          LOWER EXTREMITY MMT:         MMT Right eval Left eval  Hip flexion 5 4+  Hip extension 5 5  Hip abduction 4+ 4+  Hip adduction 4- 4-  Hip internal rotation      Hip external rotation      Knee flexion 5 5  Knee extension 5 5  Ankle dorsiflexion 5 5  Ankle plantarflexion      Ankle inversion      Ankle eversion       (Blank rows = not tested)     LUMBAR SPECIAL TESTS:  SI Compression/distraction test: Negative, FABER test: Negative, Gaenslen's test: Negative, Trendelenburg sign: Negative, and Thomas test: Negative,    FUNCTIONAL TESTS:  Single leg stance: NT    GAIT: Distance walked: 50 ft  Assistive device utilized: None Level of assistance: Complete Independence Comments: No gait deficits noted    TODAY'S TREATMENT:                                                                                                                              DATE:    07/22/22: TM 1.5 mph for 6 min with BUE support  Bird Dogs 1 x 10  -Foam block placed on back for tactile  -Pt reports increased wrist pain  Bird Dogs with bolster 1 x 10  Bird Dogs with silver physioball 1 x 10  Bird Dogs planking on elevated mat 1 x 10  Single Leg Stance on LLE 5 x 10 sec holds  OMEGA Seated Knee Extension on LLE #15 3 x 10  OMEGA Leg Press on LLE #25 3 x 10  07/14/22: TM 1.5 mph for 5 min  Omega Knee Extension on LLE #15 3 x 10  Omega HS Curl on LLE #15 3 x 10  Side Lying Hip Abduction 3 x 10   07/07/22: Supine Bridges 3 x 10 Standing Lumbar Extension x 10  Side Lying Hip Abduction 3 x 10  Prone Hip Extension   06/24/22: Therex  Recumbent Bicycle seat at 11 with resistance of 2 for 5 min Hip MMT:  -Flexion R/L 5/5  -Adduction R/L 4/4  Side Lying Hip Abduction on diagonal 1 x 10  -Pt reports that exercise is easy  Side Step Up on 6 inch step with 1 UE support 2 x 10  Squat with #20 KB 1 x 10  -Pt unable to perform; weight is too heavy.  Squat with #15 DB 1 x 10  -min VC to keep knees behind toes    06/16/22: Therex  Recumbent Bicycle seat at 11 with resistance of 3 for 5 min Science writer with BUE support 2 x 10  -mod VC for position of knee behind toes Standing Quad Stretch 3 x 30 sec     PATIENT EDUCATION:  Education details: form and technique for appropriate exercise and explanation of deficits and SIJ PT diagnosis  Person educated: Patient Education method: Explanation, Demonstration, Verbal cues, and Handouts Education comprehension: verbalized understanding, returned demonstration, and verbal cues required   HOME EXERCISE PROGRAM: Access Code: [email or web address] URL: [email or web address] Date: 06/24/2022 Prepared by: Bradly Chris  Exercises - Supine Lower Trunk Rotation  - 7 x weekly - 3 sets - 10 reps - Supine Single Knee to Chest Stretch  - 1 x daily - 3 sets - 10 reps - 3 sec  hold - Supine Butterfly Groin  Stretch  - 1 x daily - 3 reps - 30 sec  hold - Standing Shoulder Horizontal Abduction with Resistance  - 3 x weekly - 3 sets - 10 reps - Ab Prep  - 3 x weekly - 3 sets - 10 reps - Lateral Step Up with Counter Support  - 3 x weekly - 3 sets - 10 reps - Squat  - 3 x weekly - 3 sets - 10 reps   ASSESSMENT:   CLINICAL IMPRESSION:   Pt continues to be able to perform all exercises without an increase in her low back pain. Modified bird dogs to include elevated plank to avoid aggravating pain in wrists. Despite preference for extension based exercises, pt was able to perform leg press without an aggravation of low back pain. She will continue to benefit from skilled PT to reduce left SIJ joint pain and to increase hip strength to stand and walk for prolonged periods of time to carry out tasks of her job.  OBJECTIVE IMPAIRMENTS: decreased mobility, decreased strength and pain.    ACTIVITY LIMITATIONS: carrying, bending, squatting, transfers, and locomotion level   PARTICIPATION LIMITATIONS: occupation   PERSONAL FACTORS: Past/current experiences, Time since onset of injury/illness/exacerbation, and 1-2 comorbidities: occipital neuralgia and h/o brother with ankylosing spondylosis are also affecting patient's functional outcome.    REHAB POTENTIAL: Good   CLINICAL DECISION MAKING: Stable/uncomplicated   EVALUATION COMPLEXITY: Low     GOALS: Goals reviewed with patient? No   SHORT TERM GOALS: Target date: 04/15/2022   Pt will be independent with HEP in order to improve strength and balance in order to decrease fall risk and improve function at home and work. Baseline: Able to perform independently  Goal status: Achieved  LONG TERM GOALS: Target date: 06/10/2022   Patient will have improved function and activity level as evidenced by an increase in FOTO score by 10 points or more.  Baseline: 60/100 with target of 71 05/20/22: 63/100 06/24/22: 61/100 Goal status: Ongoing    2.  Patient  will improve hip strength by 1/3 MMT grade for improved spinal stability and reduction of hip pain.  Baseline: Hip Add R/L 4-/4- Hip Flex L 4+, Hip Abd R/L 4+/4+  06/10/22: Hip Add R/L 4/4 Hip Flex L 4+, Hip Abd R/L 5/5  06/24/22: Hip Flex R/L 5/5, Hip Add R/L 4/4+ Goal status: Achieved      PLAN:   PT FREQUENCY: 1-2x/week   PT DURATION: 10 weeks   PLANNED INTERVENTIONS: Therapeutic exercises, Gait training, Joint mobilization, Joint manipulation, Electrical stimulation, Spinal manipulation, Spinal mobilization, Cryotherapy, Moist heat, Manual therapy, and Re-evaluation.   PLAN FOR NEXT SESSION: Continue with lumbar extension exercises: Prone press ups, Bird Dogs, etc   Bradly Chris PT, DPT  Physical Therapist- [facility]  07/22/2022, 10:22 AM

## 2022-07-29 ENCOUNTER — Encounter: Payer: BC Managed Care – PPO | Admitting: Physical Therapy

## 2022-08-04 ENCOUNTER — Ambulatory Visit: Payer: BC Managed Care – PPO | Attending: Internal Medicine | Admitting: Physical Therapy

## 2022-08-04 ENCOUNTER — Encounter: Payer: Self-pay | Admitting: Physical Therapy

## 2022-08-04 DIAGNOSIS — M533 Sacrococcygeal disorders, not elsewhere classified: Secondary | ICD-10-CM | POA: Diagnosis present

## 2022-08-04 DIAGNOSIS — G8929 Other chronic pain: Secondary | ICD-10-CM | POA: Diagnosis present

## 2022-08-04 DIAGNOSIS — M5459 Other low back pain: Secondary | ICD-10-CM | POA: Insufficient documentation

## 2022-08-04 NOTE — Therapy (Signed)
OUTPATIENT PHYSICAL THERAPY TREATMENT NOTE  Dates of Reporting: 04/01/22-06/10/22  Patient Name: Tina Fleming MRN: [id number] DOB:[birth year], 42 y.o., female Today's Date: 08/04/2022  PCP: Dr. Einar Pheasant  REFERRING PROVIDER: Dr. Einar Pheasant   END OF SESSION:   PT End of Session - 08/04/22 1112     Visit Number 14    Number of Visits 20    Date for PT Re-Evaluation 08/11/22    Authorization Type BCBS    Authorization Time Period 06/12/22-08/11/22    Authorization - Visit Number 14    Authorization - Number of Visits 20    Progress Note Due on Visit 20    PT Start Time 1110    PT Stop Time 1150    PT Time Calculation (min) 40 min    Activity Tolerance Patient tolerated treatment well    Behavior During Therapy [facility] for tasks assessed/performed               Past Medical History:  Diagnosis Date   Bilateral occipital neuralgia    Complication of anesthesia    hard to wake due to  prolonged sedation   DUB (dysfunctional uterine bleeding)    Heart murmur    per pt asymtomatic, dx age 89 and told had normal echo   History of 2019 novel coronavirus disease (COVID-19) 05/2018   per pt mild symptoms that resolved   History of kidney stones    History of urethral stricture    age 85 s/p dilatation   IBS (irritable bowel syndrome)    IDA (iron deficiency anemia)    Intractable migraine with aura without status migrainosus    neruologist--- dr h. Manuella Ghazi   Thyroid nodule    per pt had biopsy of nodule approx. 2019 or 2020 , benign , and no follow up needed   Vitamin D deficiency    Past Surgical History:  Procedure Laterality Date   DILATATION & CURETTAGE/HYSTEROSCOPY WITH MYOSURE N/A 03/20/2016   Procedure: DILATATION & CURETTAGE/HYSTEROSCOPY WITH MYOSURE;  Surgeon: Servando Salina, MD;  Location: [facility];  Service: Gynecology;  Laterality: N/A;  45 min. requested   DILATATION & CURETTAGE/HYSTEROSCOPY WITH MYOSURE N/A 12/06/2020   Procedure: DILATATION &  CURETTAGE/DIAGNOSTIC HYSTEROSCOPY/ HYSTEROSCOPIC RESECTION OF ENDOMETRIAL POLYP USING MYOSURE;  Surgeon: Servando Salina, MD;  Location: [place];  Service: Gynecology;  Laterality: N/A;   EXTRACORPOREAL SHOCK WAVE LITHOTRIPSY  04/18/2013   '@[facility]'$    EXTRACORPOREAL SHOCK WAVE LITHOTRIPSY Right 07/07/2021   Procedure: RIGHT EXTRACORPOREAL SHOCK WAVE LITHOTRIPSY (ESWL);  Surgeon: Lucas Mallow, MD;  Location: [facility];  Service: Urology;  Laterality: Right;   KNEE ARTHROSCOPY  04/24/2011   Procedure: ARTHROSCOPY KNEE;  Surgeon: Johnn Hai;  Location: [place];  Service: Orthopedics;  Laterality: Right;  /Arthroscopy with debridement, right knee   LAPAROSCOPIC APPENDECTOMY  03/21/2012   Procedure: APPENDECTOMY LAPAROSCOPIC;  Surgeon: Joyice Faster. Cornett, MD;  Location: [place];  Service: General;  Laterality: N/A;   URETHRAL STRICTURE DILATATION     age 34   Patient Active Problem List   Diagnosis Date Noted   Sacral mass 06/11/2022   Genetic testing 02/16/2022   Obesity (BMI 35.0-39.9 without comorbidity) 09/21/2021   Iron deficiency anemia 04/11/2021   Abdominal pain 03/31/2021   Traumatic ecchymosis of right lower leg 03/23/2020   Hematoma of right lower extremity 03/23/2020   Nausea 08/21/2019   Abdominal bloating 08/21/2019   History of COVID-19 06/24/2019   URI (upper  respiratory infection) 08/26/2018   Thyroid nodule 02/27/2018   Intractable migraine with aura with status migrainosus 05/14/2017   Stress 01/10/2017   DUB (dysfunctional uterine bleeding) 07/16/2016   Low back pain 04/30/2015   Fatigue 02/08/2015   Skin lesion of breast 08/06/2014   Health care maintenance 08/06/2014   Nephrolithiasis 02/11/2013   Migraine 02/11/2013   History of frequent urinary tract infections 02/11/2013   Syncope 02/11/2013   IBS (irritable bowel syndrome) 02/11/2013   Anemia 02/11/2013   Vitamin D deficiency 02/11/2013     REFERRING DIAG: 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 It has been getting progressively worse since then. She feels the pain when she sits for awhile or walks for a while. She exercises by doing dance videos and using walking pads.   PRECAUTIONS: None   SUBJECTIVE:                                                                                                                                                                                      SUBJECTIVE STATEMENT: Pt reports that she felt like she over did while [place] at beach and she had some pain flares.   PAIN:  Are you having pain? No   OBJECTIVE:    VITALS: BP 140/102 SpO2 100 HR 83    DIAGNOSTIC FINDINGS:  CLINICAL DATA:  Patient having lithotripsy this morning.   EXAM: ABDOMEN - 1 VIEW   COMPARISON:  March 31, 2021 June 25, 2021   FINDINGS: There is a 7 mm stone in the right mid kidney and a 3.5 mm stone in the lower right kidney. No other renal stones identified. There is a stable phleboliths in the deep left pelvis. A sclerotic rounded region projects over the left side of the sacrum measuring 4 mm, unchanged since November 2022 and June 25, 2021 but new since 2014.   IMPRESSION: 1. Two stones in the right kidney as above. 2. The 4 mm sclerotic focus projected over the left side of the sacrum is unchanged since November 2022 but new since 2014. This may represent a bone island but is nonspecific. This finding is more medially located than expected for a ureteral stone. 3. Phlebolith in the deep left pelvis.     Electronically Signed   By: Dorise Bullion III M.D.   On: 07/07/2021 10:34   PATIENT SURVEYS:  FOTO 61/100   SCREENING FOR RED  FLAGS: Bowel or  bladder incontinence: No Spinal tumors: No Cauda equina syndrome: No Compression fracture: No Abdominal aneurysm: No   COGNITION: Overall cognitive status: Within functional limits for tasks assessed                          SENSATION: WFL   MUSCLE LENGTH: Hamstrings: Right 90 deg; Left 90 deg Thomas test: Negative Bilateral  Ely's test: Negative Bilateral    POSTURE: decreased lumbar lordosis   PALPATION: Left PSIS    LUMBAR ROM:    AROM eval  Flexion 100%  Extension 100%  Right lateral flexion 100%  Left lateral flexion 100%  Right rotation 100%  Left rotation 100%   (Blank rows = not tested)   LOWER EXTREMITY ROM:          Active  Right 04/01/2022 Left 04/01/2022  Hip flexion 120 120  Hip extension 30 30  Hip abduction 45 45  Hip adduction 30 30  Hip internal rotation 45 45  Hip external rotation 45 45  Knee flexion 135 135  Knee extension 0 0  Ankle dorsiflexion 20 20  Ankle plantarflexion 50 50  Ankle inversion 35 35  Ankle eversion 15 15   (Blank rows = not tested)          LOWER EXTREMITY MMT:         MMT Right eval Left eval  Hip flexion 5 4+  Hip extension 5 5  Hip abduction 4+ 4+  Hip adduction 4- 4-  Hip internal rotation      Hip external rotation      Knee flexion 5 5  Knee extension 5 5  Ankle dorsiflexion 5 5  Ankle plantarflexion      Ankle inversion      Ankle eversion       (Blank rows = not tested)     LUMBAR SPECIAL TESTS:  SI Compression/distraction test: Negative, FABER test: Negative, Gaenslen's test: Negative, Trendelenburg sign: Negative, and Thomas test: Negative,    FUNCTIONAL TESTS:  Single leg stance: NT    GAIT: Distance walked: 50 ft  Assistive device utilized: None Level of assistance: Complete Independence Comments: No gait deficits noted    TODAY'S TREATMENT:                                                                                                                              DATE:    08/04/22: TM 1.5 mph for 5 min  Prone Hip Extension #3 AW  3 x 10  Side Lying Hip Abduction #3 x 10  Prone Hip Extension with red band 2 x 10  Side Lying Hip Abduction with red band 2 x 10   07/22/22: TM 1.5 mph for 6 min with BUE support  Bird Dogs 1 x 10  -Foam block placed on back for tactile  -Pt reports increased wrist pain  Bird Dogs with bolster  1 x 10  Bird Dogs with silver physioball 1 x 10  Bird Dogs planking on elevated mat 1 x 10  Single Leg Stance on LLE 5 x 10 sec holds  OMEGA Seated Knee Extension on LLE #15 3 x 10  OMEGA Leg Press on LLE #25 3 x 10   07/14/22: TM 1.5 mph for 5 min  Omega Knee Extension on LLE #15 3 x 10  Omega HS Curl on LLE #15 3 x 10  Side Lying Hip Abduction 3 x 10     PATIENT EDUCATION:  Education details: form and technique for appropriate exercise and explanation of deficits and SIJ PT diagnosis  Person educated: Patient Education method: Explanation, Demonstration, Verbal cues, and Handouts Education comprehension: verbalized understanding, returned demonstration, and verbal cues required   HOME EXERCISE PROGRAM: Access Code: [email or web address] URL: [email or web address] Date: 08/04/2022 Prepared by: Bradly Chris  Exercises - Supine Bridge  - 3 x weekly - 3 sets - 10 reps - Sidelying Hip Abduction  - 3 x weekly - 3 sets - 10 reps - Prone Hip Extension  - 3 x weekly - 3 sets - 10 reps   ASSESSMENT:   CLINICAL IMPRESSION:   Pt able to show improvement with hip strength with ability to perform hip abduction and extension exercises with increased resistance in extension based positions and without provocation. She will continue to benefit from skilled PT to reduce left SIJ joint pain and to increase hip strength to stand and walk for prolonged periods of time to carry out tasks of her job.  OBJECTIVE IMPAIRMENTS: decreased mobility, decreased strength and pain.    ACTIVITY LIMITATIONS: carrying, bending, squatting,  transfers, and locomotion level   PARTICIPATION LIMITATIONS: occupation   PERSONAL FACTORS: Past/current experiences, Time since onset of injury/illness/exacerbation, and 1-2 comorbidities: occipital neuralgia and h/o brother with ankylosing spondylosis are also affecting patient's functional outcome.    REHAB POTENTIAL: Good   CLINICAL DECISION MAKING: Stable/uncomplicated   EVALUATION COMPLEXITY: Low     GOALS: Goals reviewed with patient? No   SHORT TERM GOALS: Target date: 04/15/2022   Pt will be independent with HEP in order to improve strength and balance in order to decrease fall risk and improve function at home and work. Baseline: Able to perform independently  Goal status: Achieved    LONG TERM GOALS: Target date: 06/10/2022   Patient will have improved function and activity level as evidenced by an increase in FOTO score by 10 points or more.  Baseline: 60/100 with target of 71 05/20/22: 63/100 06/24/22: 61/100 Goal status: Ongoing    2.  Patient will improve hip strength by 1/3 MMT grade for improved spinal stability and reduction of hip pain.  Baseline: Hip Add R/L 4-/4- Hip Flex L 4+, Hip Abd R/L 4+/4+  06/10/22: Hip Add R/L 4/4 Hip Flex L 4+, Hip Abd R/L 5/5  06/24/22: Hip Flex R/L 5/5, Hip Add R/L 4/4+ Goal status: Achieved      PLAN:   PT FREQUENCY: 1-2x/week   PT DURATION: 10 weeks   PLANNED INTERVENTIONS: Therapeutic exercises, Gait training, Joint mobilization, Joint manipulation, Electrical stimulation, Spinal manipulation, Spinal mobilization, Cryotherapy, Moist heat, Manual therapy, and Re-evaluation.   PLAN FOR NEXT SESSION: Re-certification. Continue with lumbar extension exercises: Prone press ups, Bird Dogs, etc   Bradly Chris PT, DPT  Physical Therapist- [facility]  08/04/2022, 11:13 AM

## 2022-08-11 ENCOUNTER — Encounter: Payer: Self-pay | Admitting: Physical Therapy

## 2022-08-11 ENCOUNTER — Ambulatory Visit: Payer: BC Managed Care – PPO | Admitting: Physical Therapy

## 2022-08-11 DIAGNOSIS — G8929 Other chronic pain: Secondary | ICD-10-CM

## 2022-08-11 DIAGNOSIS — M5459 Other low back pain: Secondary | ICD-10-CM | POA: Diagnosis not present

## 2022-08-11 NOTE — Therapy (Signed)
OUTPATIENT PHYSICAL THERAPY TREATMENT NOTE  Dates of Reporting: 04/01/22-06/10/22  Patient Name: Tina Fleming MRN: [id number] DOB:[birth year], 42 y.o., female Today's Date: 08/11/2022  PCP: Dr. Einar Pheasant  REFERRING PROVIDER: Dr. Einar Pheasant   END OF SESSION:   PT End of Session - 08/11/22 1019     Visit Number 15    Number of Visits 20    Date for PT Re-Evaluation 08/11/22    Authorization Type BCBS    Authorization Time Period 06/12/22-08/11/22    Authorization - Visit Number 15    Authorization - Number of Visits 20    Progress Note Due on Visit 20    PT Start Time [id number]    PT Stop Time 1100    PT Time Calculation (min) 45 min    Activity Tolerance Patient tolerated treatment well    Behavior During Therapy [facility] for tasks assessed/performed               Past Medical History:  Diagnosis Date   Bilateral occipital neuralgia    Complication of anesthesia    hard to wake due to  prolonged sedation   DUB (dysfunctional uterine bleeding)    Heart murmur    per pt asymtomatic, dx age 89 and told had normal echo   History of 2019 novel coronavirus disease (COVID-19) 05/2018   per pt mild symptoms that resolved   History of kidney stones    History of urethral stricture    age 47 s/p dilatation   IBS (irritable bowel syndrome)    IDA (iron deficiency anemia)    Intractable migraine with aura without status migrainosus    neruologist--- dr h. Manuella Ghazi   Thyroid nodule    per pt had biopsy of nodule approx. 2019 or 2020 , benign , and no follow up needed   Vitamin D deficiency    Past Surgical History:  Procedure Laterality Date   DILATATION & CURETTAGE/HYSTEROSCOPY WITH MYOSURE N/A 03/20/2016   Procedure: DILATATION & CURETTAGE/HYSTEROSCOPY WITH MYOSURE;  Surgeon: Servando Salina, MD;  Location: [facility];  Service: Gynecology;  Laterality: N/A;  45 min. requested   DILATATION & CURETTAGE/HYSTEROSCOPY WITH MYOSURE N/A 12/06/2020   Procedure: DILATATION &  CURETTAGE/DIAGNOSTIC HYSTEROSCOPY/ HYSTEROSCOPIC RESECTION OF ENDOMETRIAL POLYP USING MYOSURE;  Surgeon: Servando Salina, MD;  Location: [place];  Service: Gynecology;  Laterality: N/A;   EXTRACORPOREAL SHOCK WAVE LITHOTRIPSY  04/18/2013   @[facility]    EXTRACORPOREAL SHOCK WAVE LITHOTRIPSY Right 07/07/2021   Procedure: RIGHT EXTRACORPOREAL SHOCK WAVE LITHOTRIPSY (ESWL);  Surgeon: Lucas Mallow, MD;  Location: [facility];  Service: Urology;  Laterality: Right;   KNEE ARTHROSCOPY  04/24/2011   Procedure: ARTHROSCOPY KNEE;  Surgeon: Johnn Hai;  Location: [place];  Service: Orthopedics;  Laterality: Right;  /Arthroscopy with debridement, right knee   LAPAROSCOPIC APPENDECTOMY  03/21/2012   Procedure: APPENDECTOMY LAPAROSCOPIC;  Surgeon: Joyice Faster. Cornett, MD;  Location: [place];  Service: General;  Laterality: N/A;   URETHRAL STRICTURE DILATATION     age 89   Patient Active Problem List   Diagnosis Date Noted   Sacral mass 06/11/2022   Genetic testing 02/16/2022   Obesity (BMI 35.0-39.9 without comorbidity) 09/21/2021   Iron deficiency anemia 04/11/2021   Abdominal pain 03/31/2021   Traumatic ecchymosis of right lower leg 03/23/2020   Hematoma of right lower extremity 03/23/2020   Nausea 08/21/2019   Abdominal bloating 08/21/2019   History of COVID-19 06/24/2019   URI (upper  respiratory infection) 08/26/2018   Thyroid nodule 02/27/2018   Intractable migraine with aura with status migrainosus 05/14/2017   Stress 01/10/2017   DUB (dysfunctional uterine bleeding) 07/16/2016   Low back pain 04/30/2015   Fatigue 02/08/2015   Skin lesion of breast 08/06/2014   Health care maintenance 08/06/2014   Nephrolithiasis 02/11/2013   Migraine 02/11/2013   History of frequent urinary tract infections 02/11/2013   Syncope 02/11/2013   IBS (irritable bowel syndrome) 02/11/2013   Anemia 02/11/2013   Vitamin D deficiency 02/11/2013     REFERRING DIAG: 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 It has been getting progressively worse since then. She feels the pain when she sits for awhile or walks for a while. She exercises by doing dance videos and using walking pads.   PRECAUTIONS: None   SUBJECTIVE:                                                                                                                                                                                      SUBJECTIVE STATEMENT: Pt reports that she continues feel left sided low back pain sporadically. She saw her neurosurgeon again who suggested she continue with physical therapy as long as physical therapist feels is warranted. She continues to do exercises without symptom provocation. She does occasionally do exercise videos which stress flexion based positions, but this does not seem to trigger symptoms.   PAIN:  Are you having pain? No   OBJECTIVE:    VITALS: BP 140/102 SpO2 100 HR 83    DIAGNOSTIC FINDINGS:  CLINICAL DATA:  Patient having lithotripsy this morning.   EXAM: ABDOMEN - 1 VIEW   COMPARISON:  March 31, 2021 June 25, 2021   FINDINGS: There is a 7 mm stone in the right mid kidney and a 3.5 mm stone in the lower right kidney. No other renal stones identified. There is a stable phleboliths in the deep left pelvis. A sclerotic rounded region projects over the left side of the sacrum measuring 4 mm, unchanged since November 2022 and June 25, 2021 but new since 2014.   IMPRESSION: 1. Two stones in the right kidney as above. 2. The 4 mm sclerotic focus projected over the left side of the sacrum is unchanged since November 2022 but new since 2014. This may represent a bone island but is  nonspecific. This finding is more medially located than expected for a  ureteral stone. 3. Phlebolith in the deep left pelvis.     Electronically Signed   By: Dorise Bullion III M.D.   On: 07/07/2021 10:34   PATIENT SURVEYS:  FOTO 61/100   SCREENING FOR RED FLAGS: Bowel or bladder incontinence: No Spinal tumors: No Cauda equina syndrome: No Compression fracture: No Abdominal aneurysm: No   COGNITION: Overall cognitive status: Within functional limits for tasks assessed                          SENSATION: WFL   MUSCLE LENGTH: Hamstrings: Right 90 deg; Left 90 deg Thomas test: Negative Bilateral  Ely's test: Negative Bilateral    POSTURE: decreased lumbar lordosis   PALPATION: Left PSIS    LUMBAR ROM:    AROM eval  Flexion 100%  Extension 100%  Right lateral flexion 100%  Left lateral flexion 100%  Right rotation 100%  Left rotation 100%   (Blank rows = not tested)   LOWER EXTREMITY ROM:          Active  Right 04/01/2022 Left 04/01/2022  Hip flexion 120 120  Hip extension 30 30  Hip abduction 45 45  Hip adduction 30 30  Hip internal rotation 45 45  Hip external rotation 45 45  Knee flexion 135 135  Knee extension 0 0  Ankle dorsiflexion 20 20  Ankle plantarflexion 50 50  Ankle inversion 35 35  Ankle eversion 15 15   (Blank rows = not tested)          LOWER EXTREMITY MMT:         MMT Right eval Left eval  Hip flexion 5 4+  Hip extension 5 5  Hip abduction 4+ 4+  Hip adduction 4- 4-  Hip internal rotation      Hip external rotation      Knee flexion 5 5  Knee extension 5 5  Ankle dorsiflexion 5 5  Ankle plantarflexion      Ankle inversion      Ankle eversion       (Blank rows = not tested)     LUMBAR SPECIAL TESTS:  SI Compression/distraction test: Negative, FABER test: Negative, Gaenslen's test: Negative, Trendelenburg sign: Negative, and Thomas test: Negative,    FUNCTIONAL TESTS:  Single leg stance: NT    GAIT: Distance  walked: 50 ft  Assistive device utilized: None Level of assistance: Complete Independence Comments: No gait deficits noted    TODAY'S TREATMENT:                                                                                                                              DATE:   08/11/22: Recumbent Bicycle with seat at 6 and resistance at 3 for 5 min  Reviewed education on disc herniation and interventions available to treat this condition and provided resource to patient: [email or web address] FOTO: 45 Reviewed HEP with progressions including new exercises: Plank and  superman with arc   08/04/22: TM 1.5 mph for 5 min  Prone Hip Extension #3 AW  3 x 10  Side Lying Hip Abduction #3 x 10  Prone Hip Extension with red band 2 x 10  Side Lying Hip Abduction with red band 2 x 10   07/22/22: TM 1.5 mph for 6 min with BUE support  Bird Dogs 1 x 10  -Foam block placed on back for tactile  -Pt reports increased wrist pain  Bird Dogs with bolster 1 x 10  Bird Dogs with silver physioball 1 x 10  Bird Dogs planking on elevated mat 1 x 10  Single Leg Stance on LLE 5 x 10 sec holds  OMEGA Seated Knee Extension on LLE #15 3 x 10  OMEGA Leg Press on LLE #25 3 x 10   07/14/22: TM 1.5 mph for 5 min  Omega Knee Extension on LLE #15 3 x 10  Omega HS Curl on LLE #15 3 x 10  Side Lying Hip Abduction 3 x 10     PATIENT EDUCATION:  Education details: form and technique for appropriate exercise and explanation of deficits and SIJ PT diagnosis  Person educated: Patient Education method: Explanation, Demonstration, Verbal cues, and Handouts Education comprehension: verbalized understanding, returned demonstration, and verbal cues required   HOME EXERCISE PROGRAM: Access Code: [email or web address] URL: [email or web address] Date: 08/11/2022 Prepared by: Bradly Chris  Exercises - Supine Bridge  - 3 x weekly - 3 sets - 10 reps - Sidelying Hip Abduction  - 3 x weekly - 3  sets - 10 reps - Standard Plank  - 3 x weekly - 1 sets - 5 reps - 10 sec  hold - Prone Press Up  - 1 x daily - 1 sets - 10 reps - 3 sec  hold - Standing Lumbar Extension  - 1 x daily - 1 sets - 10 reps - 3 sec  hold - Supine Bridge with Leg Extension  - 3 x weekly - 3 sets - 10 reps - Bird Dog on Forearms  - 3 x weekly - 3 sets - 10 reps - Superman on Table  - 3 x weekly - 1 sets - 5 reps - 5 sec  hold   ASSESSMENT:   CLINICAL IMPRESSION:   Pt has met all her rehab goals despite continuing to feel ongoing symptom provocation. She feels confident to manage her underlying condition through HEP and she is able to demonstrate understanding through performance of exercises. PT instructed pt to return to PT if she has any additional issues with physical therapy or her condition worsens. Pt is in agreement with this plan and she is now ready for discharge.   OBJECTIVE IMPAIRMENTS: decreased mobility, decreased strength and pain.    ACTIVITY LIMITATIONS: carrying, bending, squatting, transfers, and locomotion level   PARTICIPATION LIMITATIONS: occupation   PERSONAL FACTORS: Past/current experiences, Time since onset of injury/illness/exacerbation, and 1-2 comorbidities: occipital neuralgia and h/o brother with ankylosing spondylosis are also affecting patient's functional outcome.    REHAB POTENTIAL: Good   CLINICAL DECISION MAKING: Stable/uncomplicated   EVALUATION COMPLEXITY: Low     GOALS: Goals reviewed with patient? No   SHORT TERM GOALS: Target date: 04/15/2022   Pt will be independent with HEP in order to improve strength and balance in order to decrease fall risk and improve function at home and work. Baseline: Able to perform independently  Goal status: Achieved    LONG TERM GOALS: Target date: 06/10/2022  Patient will have improved function and activity level as evidenced by an increase in FOTO score by 10 points or more.  Baseline: 60/100 with target of 71 05/20/22: 63/100  06/24/22: 61/100 08/11/22: 77 Goal status: Achieved    2.  Patient will improve hip strength by 1/3 MMT grade for improved spinal stability and reduction of hip pain.  Baseline: Hip Add R/L 4-/4- Hip Flex L 4+, Hip Abd R/L 4+/4+  06/10/22: Hip Add R/L 4/4 Hip Flex L 4+, Hip Abd R/L 5/5  06/24/22: Hip Flex R/L 5/5, Hip Add R/L 4/4+ Goal status: Achieved      PLAN:   PT FREQUENCY: 1-2x/week   PT DURATION: 10 weeks   PLANNED INTERVENTIONS: Therapeutic exercises, Gait training, Joint mobilization, Joint manipulation, Electrical stimulation, Spinal manipulation, Spinal mobilization, Cryotherapy, Moist heat, Manual therapy, and Re-evaluation.   PLAN FOR NEXT SESSION: Discharge from physical therapy   Bradly Chris PT, DPT  Physical Therapist- [facility]  08/11/2022, 10:20 AM

## 2022-08-18 ENCOUNTER — Ambulatory Visit: Payer: BC Managed Care – PPO | Admitting: Physical Therapy

## 2022-08-25 ENCOUNTER — Encounter: Payer: BC Managed Care – PPO | Admitting: Physical Therapy

## 2022-09-08 ENCOUNTER — Encounter: Payer: BC Managed Care – PPO | Admitting: Physical Therapy

## 2022-09-11 ENCOUNTER — Other Ambulatory Visit: Payer: BC Managed Care – PPO

## 2022-09-11 ENCOUNTER — Ambulatory Visit: Payer: BC Managed Care – PPO | Admitting: Oncology

## 2022-09-15 ENCOUNTER — Encounter: Payer: BC Managed Care – PPO | Admitting: Physical Therapy

## 2022-09-16 ENCOUNTER — Encounter: Payer: BC Managed Care – PPO | Admitting: Internal Medicine

## 2023-01-20 ENCOUNTER — Other Ambulatory Visit: Payer: Self-pay

## 2023-01-20 ENCOUNTER — Ambulatory Visit: Payer: BC Managed Care – PPO | Attending: Neurosurgery | Admitting: Physical Therapy

## 2023-01-20 ENCOUNTER — Encounter: Payer: Self-pay | Admitting: Physical Therapy

## 2023-01-20 DIAGNOSIS — M5459 Other low back pain: Secondary | ICD-10-CM | POA: Diagnosis present

## 2023-01-20 NOTE — Therapy (Signed)
OUTPATIENT PHYSICAL THERAPY THORACOLUMBAR EVALUATION   Patient Name: Tina Fleming MRN: [id number] DOB:Aug 07, 1980, 42 y.o., female Today's Date: 01/20/2023  END OF SESSION:  PT End of Session - 01/20/23 1306     Visit Number 1    Number of Visits 20    Date for PT Re-Evaluation 03/31/23    Authorization Type BCBS [id number]    Authorization Time Period 01/20/23-03/31/2023    Authorization - Visit Number 1    Authorization - Number of Visits 20    Progress Note Due on Visit 10    PT Start Time 1300    PT Stop Time 1345    PT Time Calculation (min) 45 min    Activity Tolerance Patient tolerated treatment well    Behavior During Therapy [facility] for tasks assessed/performed             Past Medical History:  Diagnosis Date   Bilateral occipital neuralgia    Complication of anesthesia    hard to wake due to  prolonged sedation   DUB (dysfunctional uterine bleeding)    Heart murmur    per pt asymtomatic, dx age 35 and told had normal echo   History of 2019 novel coronavirus disease (COVID-19) 05/2018   per pt mild symptoms that resolved   History of kidney stones    History of urethral stricture    age 50 s/p dilatation   IBS (irritable bowel syndrome)    IDA (iron deficiency anemia)    Intractable migraine with aura without status migrainosus    neruologist--- dr h. Sherryll Burger   Thyroid nodule    per pt had biopsy of nodule approx. 2019 or 2020 , benign , and no follow up needed   Vitamin D deficiency    Past Surgical History:  Procedure Laterality Date   DILATATION & CURETTAGE/HYSTEROSCOPY WITH MYOSURE N/A 03/20/2016   Procedure: DILATATION & CURETTAGE/HYSTEROSCOPY WITH MYOSURE;  Surgeon: Maxie Better, MD;  Location: [facility];  Service: Gynecology;  Laterality: N/A;  45 min. requested   DILATATION & CURETTAGE/HYSTEROSCOPY WITH MYOSURE N/A 12/06/2020   Procedure: DILATATION & CURETTAGE/DIAGNOSTIC HYSTEROSCOPY/ HYSTEROSCOPIC RESECTION OF ENDOMETRIAL POLYP USING MYOSURE;  Surgeon:  Maxie Better, MD;  Location: [facility] [place];  Service: Gynecology;  Laterality: N/A;   EXTRACORPOREAL SHOCK WAVE LITHOTRIPSY  04/18/2013   @[facility]    EXTRACORPOREAL SHOCK WAVE LITHOTRIPSY Right 07/07/2021   Procedure: RIGHT EXTRACORPOREAL SHOCK WAVE LITHOTRIPSY (ESWL);  Surgeon: Crista Elliot, MD;  Location: [facility];  Service: Urology;  Laterality: Right;   KNEE ARTHROSCOPY  04/24/2011   Procedure: ARTHROSCOPY KNEE;  Surgeon: Javier Docker;  [facility];  Service: Orthopedics;  Laterality: Right;  /Arthroscopy with debridement, right knee   LAPAROSCOPIC APPENDECTOMY  03/21/2012   Procedure: APPENDECTOMY LAPAROSCOPIC;  Surgeon: Clovis Pu. Cornett, MD;  Location: [facility];  Service: General;  Laterality: N/A;   URETHRAL STRICTURE DILATATION     age 26   Patient Active Problem List   Diagnosis Date Noted   Sacral mass 06/11/2022   Genetic testing 02/16/2022   Obesity (BMI 35.0-39.9 without comorbidity) 09/21/2021   Iron deficiency anemia 04/11/2021   Abdominal pain 03/31/2021   Traumatic ecchymosis of right lower leg 03/23/2020   Hematoma of right lower extremity 03/23/2020   Nausea 08/21/2019   Abdominal bloating 08/21/2019   History of COVID-19 06/24/2019   URI (upper respiratory infection) 08/26/2018   Thyroid nodule 02/27/2018   Intractable migraine with aura with status migrainosus 05/14/2017  Stress 01/10/2017   DUB (dysfunctional uterine bleeding) 07/16/2016   Low back pain 04/30/2015   Fatigue 02/08/2015   Skin lesion of breast 08/06/2014   Health care maintenance 08/06/2014   Nephrolithiasis 02/11/2013   Migraine 02/11/2013   History of frequent urinary tract infections 02/11/2013   Syncope 02/11/2013   IBS (irritable bowel syndrome) 02/11/2013   Anemia 02/11/2013   Vitamin D deficiency 02/11/2013    PCP: Dr. Dale Clare   REFERRING PROVIDER: Dr. Donalee Citrin   REFERRING DIAG: M51.26 (ICD-10-CM) - Other  intervertebral disc displacement, lumbar region  Rationale for Evaluation and Treatment: Rehabilitation  THERAPY DIAG:  Other low back pain  ONSET DATE: 2 mo from now, 04/22/23   SUBJECTIVE:                                                                                                                                                                                           SUBJECTIVE STATEMENT: See pertinent history   PERTINENT HISTORY:  Pt started to develop bilateral hip pain months ago which she thought was LE fatigue. Pt had steriod joint injection in L4-L5 back in July, with little to no relief. She came to find out that it was a radiating pain from her spine that has worsened over time. She experiences most increase in her pain with prolonged sitting and this has limited her from being able to participate in work events like commencement. She wants to be able to sit for longer periods of time without pain worsening.   PAIN:  Are you having pain? Yes: NPRS scale: 4-5/10 Pain location: L1-L5 central spinous process and posterior knee pain  Pain description: Achy  Aggravating factors: Sitting for long period  Relieving factors: Not sure   PRECAUTIONS: None  RED FLAGS: None   WEIGHT BEARING RESTRICTIONS: No  FALLS:  Has patient fallen in last 6 months? No  LIVING ENVIRONMENT: Lives with: lives alone Lives in: House/apartment Stairs: Yes: External: 4 steps; on right going up and on left going up Has following equipment at home: None  OCCUPATION: Nursing Instructor   PLOF: Independent  PATIENT GOALS: To avoid surgery and to relieve low back pain   NEXT MD VISIT: October 2024   OBJECTIVE:   VITALS: BP 125/77 HR 79 SpO2 99  DIAGNOSTIC FINDINGS:  No recent lumbar images from the past couple of years   PATIENT SURVEYS:  FOTO 67/100 with target of 73   SCREENING FOR RED FLAGS: Bowel or bladder incontinence: No Spinal tumors: No Cauda equina syndrome:  No Compression fracture: No Abdominal aneurysm: No  COGNITION: Overall cognitive status: Within functional limits for  tasks assessed     SENSATION: WFL  MUSCLE LENGTH: Hamstrings: Right 70 deg; Left 70 deg Thomas test: Negative bilaterally  POSTURE: No Significant postural limitations  PALPATION: Left paraspinal   LUMBAR ROM:   AROM eval  Flexion 90%  Extension 100%  Right lateral flexion 100%  Left lateral flexion 100%  Right rotation 100%  Left rotation 100%   (Blank rows = not tested)  LOWER EXTREMITY ROM:     Active  Right eval Left eval  Hip flexion    Hip extension    Hip abduction    Hip adduction    Hip internal rotation    Hip external rotation    Knee flexion    Knee extension    Ankle dorsiflexion    Ankle plantarflexion    Ankle inversion    Ankle eversion     (Blank rows = not tested)  LOWER EXTREMITY MMT:    MMT Right eval Left eval  Hip flexion 4+ 4+  Hip extension 4 4  Hip abduction 4 4-  Hip adduction 4 4  Hip internal rotation    Hip external rotation    Knee flexion 4 4  Knee extension 4 4  Ankle dorsiflexion    Ankle plantarflexion    Ankle inversion    Ankle eversion     (Blank rows = not tested)  LUMBAR SPECIAL TESTS:  Straight leg raise test: Negative, SI Compression/distraction test: Negative, FABER test: Negative, and FADIR Negative   FUNCTIONAL TESTS:  None performed   GAIT: Distance walked: 50 ft  Assistive device utilized: None Level of assistance: Complete Independence Comments: No gait deficits noted   TODAY'S TREATMENT:                                                                                                                              DATE:   01/20/23:  Forward fold hamstring stretch 2 x 30 sec  Side Lying IT band stretch 2 x 30 sec   PATIENT EDUCATION:  Education details: form and technique for correct performance of exercise  Person educated: Patient Education method: Explanation,  Demonstration, Verbal cues, and Handouts Education comprehension: verbalized understanding, returned demonstration, and verbal cues required  HOME EXERCISE PROGRAM: Access Code: [email or web address] URL: [email or web address] Date: 01/20/2023 Prepared by: Ellin Goodie  Exercises - Standing Forward Trunk Flexion  - 1 x daily - 3 reps - 30 sec   hold - Sidelying ITB Stretch off Table  - 1 x daily - 3 reps - 30 sec hold  ASSESSMENT:  CLINICAL IMPRESSION: Patient is a 42 y.o. white female who was seen today for physical therapy evaluation and treatment for low back pain with radiating pain down BLE. Pt has a history of occipital neuralgia and chronic knee pain with arthritis. She demonstrates signs and symptoms that include moderate pain and stable symptom status and that make her most appropriate for the movement control treatment based classification group. She  also shows deficits that include increased lumbar pain with sitting and decreased hip strength. 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  OBJECTIVE IMPAIRMENTS: decreased ROM, decreased strength, obesity, and pain.   ACTIVITY LIMITATIONS: carrying, lifting, sitting, squatting, stairs, dressing, and locomotion level  PARTICIPATION LIMITATIONS: occupation and school  PERSONAL FACTORS: Past/current experiences and Time since onset of injury/illness/exacerbation are also affecting patient's functional outcome.   REHAB POTENTIAL: Fair h/o of msk issues and chronicity of low back pain   CLINICAL DECISION MAKING: Stable/uncomplicated  EVALUATION COMPLEXITY: Low   GOALS: Goals reviewed with patient? No  SHORT TERM GOALS: Target date: 02/03/2023  Pt will be independent with HEP in order to improve strength and balance in order to decrease fall risk and  improve function at home and work. Baseline: NT  Goal status: ONGOING   2.  Patient will understand directional preferences and activity modifications to avoid low back pain exacerbation. Baseline: Unable to perform  Goal status: INITIAL   LONG TERM GOALS: Target date: 03/31/2023  Patient will have improved function and activity level as evidenced by an increase in FOTO score by >=5 points or more.  Baseline: 67 pts with target of 73    Goal status: ONGOING    2.  Patient will improve hip strength by 1/3 grade MMT (4- to 4) for improved lumbar stability and for symptom reduction.  Baseline: Hip Abd R/L 4/4    Goal status: INITIAL  3.  Patient will be able to sit for prolonged period of time without experiencing pain of >=3/10 NRPS to better be able to perform job related tasks.    Baseline: 5/10 NRPS Goal status: INITIAL  PLAN:  PT FREQUENCY: 1-2x/week  PT DURATION: 10 weeks  PLANNED INTERVENTIONS: Therapeutic exercises, Therapeutic activity, Neuromuscular re-education, Balance training, Gait training, Patient/Family education, Self Care, Joint mobilization, Joint manipulation, Aquatic Therapy, Dry Needling, Electrical stimulation, Spinal manipulation, Spinal mobilization, Cryotherapy, Moist heat, Manual therapy, and Re-evaluation.  PLAN FOR NEXT SESSION: Dry needling of lumbar multifidi. Progress hip and abdominal strengthening     Ellin Goodie PT, DPT  [facility] [address] [place], [address] Phone: [phone]   Fax:  [phone]

## 2023-01-27 ENCOUNTER — Ambulatory Visit: Payer: BC Managed Care – PPO | Attending: Neurosurgery | Admitting: Physical Therapy

## 2023-01-27 DIAGNOSIS — M533 Sacrococcygeal disorders, not elsewhere classified: Secondary | ICD-10-CM | POA: Diagnosis present

## 2023-01-27 DIAGNOSIS — G8929 Other chronic pain: Secondary | ICD-10-CM | POA: Insufficient documentation

## 2023-01-27 DIAGNOSIS — M5459 Other low back pain: Secondary | ICD-10-CM | POA: Insufficient documentation

## 2023-01-27 NOTE — Therapy (Signed)
OUTPATIENT PHYSICAL THERAPY THORACOLUMBAR TREATMENT    Patient Name: Tina Fleming MRN: [id number] DOB:May 24, 1981, 42 y.o., female Today's Date: 01/27/2023  END OF SESSION:  PT End of Session - 01/27/23 1605     Visit Number 2    Number of Visits 20    Date for PT Re-Evaluation 03/31/23    Authorization Type BCBS [id number]    Authorization Time Period 01/20/23-03/31/2023    Authorization - Visit Number 2    Authorization - Number of Visits 20    Progress Note Due on Visit 10    PT Start Time 1600    PT Stop Time 1645    PT Time Calculation (min) 45 min    Activity Tolerance Patient tolerated treatment well    Behavior During Therapy WFL for tasks assessed/performed              Past Medical History:  Diagnosis Date   Bilateral occipital neuralgia    Complication of anesthesia    hard to wake due to  prolonged sedation   DUB (dysfunctional uterine bleeding)    Heart murmur    per pt asymtomatic, dx age 41 and told had normal echo   History of 2019 novel coronavirus disease (COVID-19) 05/2018   per pt mild symptoms that resolved   History of kidney stones    History of urethral stricture    age 80 s/p dilatation   IBS (irritable bowel syndrome)    IDA (iron deficiency anemia)    Intractable migraine with aura without status migrainosus    neruologist--- dr h. Sherryll Burger   Thyroid nodule    per pt had biopsy of nodule approx. 2019 or 2020 , benign , and no follow up needed   Vitamin D deficiency    Past Surgical History:  Procedure Laterality Date   DILATATION & CURETTAGE/HYSTEROSCOPY WITH MYOSURE N/A 03/20/2016   Procedure: DILATATION & CURETTAGE/HYSTEROSCOPY WITH MYOSURE;  Surgeon: Maxie Better, MD;  Location: [facility];  Service: Gynecology;  Laterality: N/A;  45 min. requested   DILATATION & CURETTAGE/HYSTEROSCOPY WITH MYOSURE N/A 12/06/2020   Procedure: DILATATION & CURETTAGE/DIAGNOSTIC HYSTEROSCOPY/ HYSTEROSCOPIC RESECTION OF ENDOMETRIAL POLYP USING MYOSURE;   Surgeon: Maxie Better, MD;  Location: [facility];  Service: Gynecology;  Laterality: N/A;   EXTRACORPOREAL SHOCK WAVE LITHOTRIPSY  04/18/2013   @[facility]    EXTRACORPOREAL SHOCK WAVE LITHOTRIPSY Right 07/07/2021   Procedure: RIGHT EXTRACORPOREAL SHOCK WAVE LITHOTRIPSY (ESWL);  Surgeon: Crista Elliot, MD;  Location: [facility];  Service: Urology;  Laterality: Right;   KNEE ARTHROSCOPY  04/24/2011   Procedure: ARTHROSCOPY KNEE;  Surgeon: Javier Docker;  Location: [facility];  Service: Orthopedics;  Laterality: Right;  /Arthroscopy with debridement, right knee   LAPAROSCOPIC APPENDECTOMY  03/21/2012   Procedure: APPENDECTOMY LAPAROSCOPIC;  Surgeon: Clovis Pu. Cornett, MD;  Location: [facility];  Service: General;  Laterality: N/A;   URETHRAL STRICTURE DILATATION     age 2   Patient Active Problem List   Diagnosis Date Noted   Sacral mass 06/11/2022   Genetic testing 02/16/2022   Obesity (BMI 35.0-39.9 without comorbidity) 09/21/2021   Iron deficiency anemia 04/11/2021   Abdominal pain 03/31/2021   Traumatic ecchymosis of right lower leg 03/23/2020   Hematoma of right lower extremity 03/23/2020   Nausea 08/21/2019   Abdominal bloating 08/21/2019   History of COVID-19 06/24/2019   URI (upper respiratory infection) 08/26/2018   Thyroid nodule 02/27/2018   Intractable migraine with aura with status  migrainosus 05/14/2017   Stress 01/10/2017   DUB (dysfunctional uterine bleeding) 07/16/2016   Low back pain 04/30/2015   Fatigue 02/08/2015   Skin lesion of breast 08/06/2014   Health care maintenance 08/06/2014   Nephrolithiasis 02/11/2013   Migraine 02/11/2013   History of frequent urinary tract infections 02/11/2013   Syncope 02/11/2013   IBS (irritable bowel syndrome) 02/11/2013   Anemia 02/11/2013   Vitamin D deficiency 02/11/2013    PCP: Dr. Dale Akins   REFERRING PROVIDER: Dr. Donalee Citrin   REFERRING DIAG: M51.26 (ICD-10-CM) -  Other intervertebral disc displacement, lumbar region  Rationale for Evaluation and Treatment: Rehabilitation  THERAPY DIAG:  Other low back pain  Chronic left SI joint pain  ONSET DATE: 2 mo from now, 04/22/23   SUBJECTIVE:                                                                                                                                                                                           SUBJECTIVE STATEMENT: Pt reports that she has to balance sitting and standing to avoid an increase in her low back pain.    PERTINENT HISTORY:  Pt started to develop bilateral hip pain months ago which she thought was LE fatigue. Pt had steriod joint injection in L4-L5 back in July, with little to no relief. She came to find out that it was a radiating pain from her spine that has worsened over time. She experiences most increase in her pain with prolonged sitting and this has limited her from being able to participate in work events like commencement. She wants to be able to sit for longer periods of time without pain worsening.   PAIN:  Are you having pain? No  PRECAUTIONS: None  RED FLAGS: None   WEIGHT BEARING RESTRICTIONS: No  FALLS:  Has patient fallen in last 6 months? No  LIVING ENVIRONMENT: Lives with: lives alone Lives in: House/apartment Stairs: Yes: External: 4 steps; on right going up and on left going up Has following equipment at home: None  OCCUPATION: Nursing Instructor   PLOF: Independent  PATIENT GOALS: To avoid surgery and to relieve low back pain   NEXT MD VISIT: October 2024   OBJECTIVE:   VITALS: BP 125/77 HR 79 SpO2 99  DIAGNOSTIC FINDINGS:  No recent lumbar images from the past couple of years   PATIENT SURVEYS:  FOTO 67/100 with target of 73   SCREENING FOR RED FLAGS: Bowel or bladder incontinence: No Spinal tumors: No Cauda equina syndrome: No Compression fracture: No Abdominal aneurysm: No  COGNITION: Overall cognitive  status: Within functional limits for tasks assessed  SENSATION: WFL  MUSCLE LENGTH: Hamstrings: Right 70 deg; Left 70 deg Thomas test: Negative bilaterally  POSTURE: No Significant postural limitations  PALPATION: Left paraspinal   LUMBAR ROM:   AROM eval  Flexion 90%  Extension 100%  Right lateral flexion 100%  Left lateral flexion 100%  Right rotation 100%  Left rotation 100%   (Blank rows = not tested)  LOWER EXTREMITY ROM:     Active  Right eval Left eval  Hip flexion    Hip extension    Hip abduction    Hip adduction    Hip internal rotation    Hip external rotation    Knee flexion    Knee extension    Ankle dorsiflexion    Ankle plantarflexion    Ankle inversion    Ankle eversion     (Blank rows = not tested)  LOWER EXTREMITY MMT:    MMT Right eval Left eval  Hip flexion 4+ 4+  Hip extension 4 4  Hip abduction 4 4-  Hip adduction 4 4  Hip internal rotation    Hip external rotation    Knee flexion 4 4  Knee extension 4 4  Ankle dorsiflexion    Ankle plantarflexion    Ankle inversion    Ankle eversion     (Blank rows = not tested)  LUMBAR SPECIAL TESTS:  Straight leg raise test: Negative, SI Compression/distraction test: Negative, FABER test: Negative, and FADIR Negative   FUNCTIONAL TESTS:  None performed   GAIT: Distance walked: 50 ft  Assistive device utilized: None Level of assistance: Complete Independence Comments: No gait deficits noted   TODAY'S TREATMENT:                                                                                                                              DATE:   02/16/23  MODALITY UNBILLED  Performed by: Ellin Goodie, PT, DPT Trigger Point Dry-Needling  Treatment instructions: Expect mild to moderate muscle soreness. S/S of pneumothorax if dry needled over a lung field, and to seek immediate medical attention should they occur. Patient verbalized understanding of these instructions and  education.  Patient Consent Given: Yes Muscles treated: Left multifidus  Electrical stimulation performed: No Parameters: N/A Treatment response/outcome: No twitch  Needles Size: 0.25 x 60 mm   THEREX   Side Lying Hip Abduction 2 x 10  Side Lying Hip Abduction #2 DB 1 x 10  Pallof Press #5 1 x 10  Pallof   01/20/23:  Forward fold hamstring stretch 2 x 30 sec  Side Lying IT band stretch 2 x 30 sec   PATIENT EDUCATION:  Education details: form and technique for correct performance of exercise  Person educated: Patient Education method: Explanation, Demonstration, Verbal cues, and Handouts Education comprehension: verbalized understanding, returned demonstration, and verbal cues required  HOME EXERCISE PROGRAM: Access Code: [email or web address] URL: [email or web address] Date: 01/27/2023 Prepared by: Ellin Goodie  Exercises - Standing Forward Trunk Flexion  -  1 x daily - 3 reps - 30 sec   hold - Sidelying ITB Stretch off Table  - 1 x daily - 3 reps - 30 sec hold - Sidelying Hip Abduction  - 3-4 x weekly - 3 sets - 10 reps  ASSESSMENT:  CLINICAL IMPRESSION: Pt shows activity tolerance for all exercises with no increase in her symptoms. Pt did not respond dry with twitch response. 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   OBJECTIVE IMPAIRMENTS: decreased ROM, decreased strength, obesity, and pain.   ACTIVITY LIMITATIONS: carrying, lifting, sitting, squatting, stairs, dressing, and locomotion level  PARTICIPATION LIMITATIONS: occupation and school  PERSONAL FACTORS: Past/current experiences and Time since onset of injury/illness/exacerbation are also affecting patient's functional outcome.   REHAB POTENTIAL: Fair h/o of msk issues and chronicity of low back pain   CLINICAL DECISION MAKING:  Stable/uncomplicated  EVALUATION COMPLEXITY: Low   GOALS: Goals reviewed with patient? No  SHORT TERM GOALS: Target date: 02/03/2023  Pt will be independent with HEP in order to improve strength and balance in order to decrease fall risk and improve function at home and work. Baseline: NT  Goal status: ONGOING   2.  Patient will understand directional preferences and activity modifications to avoid low back pain exacerbation. Baseline: Unable to perform  Goal status: ONGOING    LONG TERM GOALS: Target date: 03/31/2023  Patient will have improved function and activity level as evidenced by an increase in FOTO score by >=5 points or more.  Baseline: 67 pts with target of 73    Goal status: ONGOING    2.  Patient will improve hip strength by 1/3 grade MMT (4- to 4) for improved lumbar stability and for symptom reduction.  Baseline: Hip Abd R/L 4/4, Hip Ext R/L 4/4 Goal status: ONGOING   3.  Patient will be able to sit for prolonged period of time without experiencing pain of >=3/10 NRPS to better be able to perform job related tasks.    Baseline: 5/10 NRPS Goal status: ONGOING   PLAN:  PT FREQUENCY: 1-2x/week  PT DURATION: 10 weeks  PLANNED INTERVENTIONS: Therapeutic exercises, Therapeutic activity, Neuromuscular re-education, Balance training, Gait training, Patient/Family education, Self Care, Joint mobilization, Joint manipulation, Aquatic Therapy, Dry Needling, Electrical stimulation, Spinal manipulation, Spinal mobilization, Cryotherapy, Moist heat, Manual therapy, and Re-evaluation.  PLAN FOR NEXT SESSION: Dry needling of iliocostals . Progress hip and abdominal strengthening: bird dog, standing marches, etc     Ellin Goodie PT, DPT  [facility] [address] [place], [address] Phone: [phone]   Fax:  [phone]

## 2023-02-01 ENCOUNTER — Ambulatory Visit: Payer: BC Managed Care – PPO | Admitting: Physical Therapy

## 2023-02-08 ENCOUNTER — Ambulatory Visit: Payer: BC Managed Care – PPO | Admitting: Physical Therapy

## 2023-02-08 DIAGNOSIS — M5459 Other low back pain: Secondary | ICD-10-CM | POA: Diagnosis not present

## 2023-02-08 DIAGNOSIS — G8929 Other chronic pain: Secondary | ICD-10-CM

## 2023-02-08 NOTE — Therapy (Signed)
OUTPATIENT PHYSICAL THERAPY THORACOLUMBAR TREATMENT    Patient Name: Tina Fleming MRN: [id number] DOB:10-Dec-1980, 42 y.o., female Today's Date: 02/08/2023  END OF SESSION:  PT End of Session - 02/08/23 1652     Visit Number 3    Number of Visits 20    Date for PT Re-Evaluation 03/31/23    Authorization Type BCBS 2024    Authorization Time Period 01/20/23-03/31/2023    Authorization - Visit Number 3    Authorization - Number of Visits 20    Progress Note Due on Visit 10    PT Start Time 1647    PT Stop Time 1730    PT Time Calculation (min) 43 min    Activity Tolerance Patient tolerated treatment well    Behavior During Therapy [facility] for tasks assessed/performed              Past Medical History:  Diagnosis Date   Bilateral occipital neuralgia    Complication of anesthesia    hard to wake due to  prolonged sedation   DUB (dysfunctional uterine bleeding)    Heart murmur    per pt asymtomatic, dx age 76 and told had normal echo   History of 2019 novel coronavirus disease (COVID-19) 05/2018   per pt mild symptoms that resolved   History of kidney stones    History of urethral stricture    age 61 s/p dilatation   IBS (irritable bowel syndrome)    IDA (iron deficiency anemia)    Intractable migraine with aura without status migrainosus    neruologist--- dr h. Sherryll Burger   Thyroid nodule    per pt had biopsy of nodule approx. 2019 or 2020 , benign , and no follow up needed   Vitamin D deficiency    Past Surgical History:  Procedure Laterality Date   DILATATION & CURETTAGE/HYSTEROSCOPY WITH MYOSURE N/A 03/20/2016   Procedure: DILATATION & CURETTAGE/HYSTEROSCOPY WITH MYOSURE;  Surgeon: Maxie Better, MD;  Location: [facility];  Service: Gynecology;  Laterality: N/A;  45 min. requested   DILATATION & CURETTAGE/HYSTEROSCOPY WITH MYOSURE N/A 12/06/2020   Procedure: DILATATION & CURETTAGE/DIAGNOSTIC HYSTEROSCOPY/ HYSTEROSCOPIC RESECTION OF ENDOMETRIAL POLYP USING MYOSURE;   Surgeon: Maxie Better, MD;  Location: [facility];  Service: Gynecology;  Laterality: N/A;   EXTRACORPOREAL SHOCK WAVE LITHOTRIPSY  04/18/2013   @[facility]    EXTRACORPOREAL SHOCK WAVE LITHOTRIPSY Right 07/07/2021   Procedure: RIGHT EXTRACORPOREAL SHOCK WAVE LITHOTRIPSY (ESWL);  Surgeon: Crista Elliot, MD;  Location: [facility];  Service: Urology;  Laterality: Right;   KNEE ARTHROSCOPY  04/24/2011   Procedure: ARTHROSCOPY KNEE;  Surgeon: Javier Docker;  [facility];  Service: Orthopedics;  Laterality: Right;  /Arthroscopy with debridement, right knee   LAPAROSCOPIC APPENDECTOMY  03/21/2012   Procedure: APPENDECTOMY LAPAROSCOPIC;  Surgeon: Clovis Pu. Cornett, MD;  Location: [facility];  Service: General;  Laterality: N/A;   URETHRAL STRICTURE DILATATION     age 89   Patient Active Problem List   Diagnosis Date Noted   Sacral mass 06/11/2022   Genetic testing 02/16/2022   Obesity (BMI 35.0-39.9 without comorbidity) 09/21/2021   Iron deficiency anemia 04/11/2021   Abdominal pain 03/31/2021   Traumatic ecchymosis of right lower leg 03/23/2020   Hematoma of right lower extremity 03/23/2020   Nausea 08/21/2019   Abdominal bloating 08/21/2019   History of COVID-19 06/24/2019   URI (upper respiratory infection) 08/26/2018   Thyroid nodule 02/27/2018   Intractable migraine with aura with status  migrainosus 05/14/2017   Stress 01/10/2017   DUB (dysfunctional uterine bleeding) 07/16/2016   Low back pain 04/30/2015   Fatigue 02/08/2015   Skin lesion of breast 08/06/2014   Health care maintenance 08/06/2014   Nephrolithiasis 02/11/2013   Migraine 02/11/2013   History of frequent urinary tract infections 02/11/2013   Syncope 02/11/2013   IBS (irritable bowel syndrome) 02/11/2013   Anemia 02/11/2013   Vitamin D deficiency 02/11/2013    PCP: Dr. Dale Willshire   REFERRING PROVIDER: Dr. Donalee Citrin   REFERRING DIAG: M51.26 (ICD-10-CM) -  Other intervertebral disc displacement, lumbar region  Rationale for Evaluation and Treatment: Rehabilitation  THERAPY DIAG:  Other low back pain  Chronic left SI joint pain  ONSET DATE: 2 mo from now, 04/22/23   SUBJECTIVE:                                                                                                                                                                                           SUBJECTIVE STATEMENT: Pt reports radiating LLE pain that worsens with prolonged sitting.    PERTINENT HISTORY:  Pt started to develop bilateral hip pain months ago which she thought was LE fatigue. Pt had steriod joint injection in L4-L5 back in July, with little to no relief. She came to find out that it was a radiating pain from her spine that has worsened over time. She experiences most increase in her pain with prolonged sitting and this has limited her from being able to participate in work events like commencement. She wants to be able to sit for longer periods of time without pain worsening.   PAIN:  Are you having pain? No  PRECAUTIONS: None  RED FLAGS: None   WEIGHT BEARING RESTRICTIONS: No  FALLS:  Has patient fallen in last 6 months? No  LIVING ENVIRONMENT: Lives with: lives alone Lives in: House/apartment Stairs: Yes: External: 4 steps; on right going up and on left going up Has following equipment at home: None  OCCUPATION: Nursing Instructor   PLOF: Independent  PATIENT GOALS: To avoid surgery and to relieve low back pain   NEXT MD VISIT: October 2024   OBJECTIVE:   VITALS: BP 125/77 HR 79 SpO2 99  DIAGNOSTIC FINDINGS:  No recent lumbar images from the past couple of years   PATIENT SURVEYS:  FOTO 67/100 with target of 73   SCREENING FOR RED FLAGS: Bowel or bladder incontinence: No Spinal tumors: No Cauda equina syndrome: No Compression fracture: No Abdominal aneurysm: No  COGNITION: Overall cognitive status: Within functional limits  for tasks assessed     SENSATION: WFL  MUSCLE LENGTH: Hamstrings:  Right 70 deg; Left 70 deg Thomas test: Negative bilaterally  POSTURE: No Significant postural limitations  PALPATION: Left paraspinal   LUMBAR ROM:   AROM eval  Flexion 90%  Extension 100%  Right lateral flexion 100%  Left lateral flexion 100%  Right rotation 100%  Left rotation 100%   (Blank rows = not tested)  LOWER EXTREMITY ROM:     Active  Right eval Left eval  Hip flexion    Hip extension    Hip abduction    Hip adduction    Hip internal rotation    Hip external rotation    Knee flexion    Knee extension    Ankle dorsiflexion    Ankle plantarflexion    Ankle inversion    Ankle eversion     (Blank rows = not tested)  LOWER EXTREMITY MMT:    MMT Right eval Left eval  Hip flexion 4+ 4+  Hip extension 4 4  Hip abduction 4 4-  Hip adduction 4 4  Hip internal rotation    Hip external rotation    Knee flexion 4 4  Knee extension 4 4  Ankle dorsiflexion    Ankle plantarflexion    Ankle inversion    Ankle eversion     (Blank rows = not tested)  LUMBAR SPECIAL TESTS:  Straight leg raise test: Negative, SI Compression/distraction test: Negative, FABER test: Negative, and FADIR Negative   FUNCTIONAL TESTS:  None performed   GAIT: Distance walked: 50 ft  Assistive device utilized: None Level of assistance: Complete Independence Comments: No gait deficits noted   TODAY'S TREATMENT:                                                                                                                              DATE:   02/08/23: Nu-Step with seat and arms at 8 for 5 min and resistance at 4   OMEGA Leg Press #55 3 x 10  OMEGA Standing Hip Adduction #40 with arm set to 3- 2 x10 OMEGA Standing Hip Adduction #55 with pillow around arm of machine set to prevent irritation 4 x 10  Standing Marches #5 DB 3 x 10  Standing Hip Flexor Stretch on Step with BUE support 3 x 30 sec  Modified  Thomas Stretch 2 x 10 sec  -Pt reports increased dizziness when attempting exercise   01/16/23  MODALITY UNBILLED  Performed by: Ellin Goodie, PT, DPT Trigger Point Dry-Needling  Treatment instructions: Expect mild to moderate muscle soreness. S/S of pneumothorax if dry needled over a lung field, and to seek immediate medical attention should they occur. Patient verbalized understanding of these instructions and education.  Patient Consent Given: Yes Muscles treated: Left multifidus  Electrical stimulation performed: No Parameters: N/A Treatment response/outcome: No twitch  Needles Size: 0.25 x 60 mm   THEREX   Side Lying Hip Abduction 2 x 10  Side Lying Hip Abduction #2 DB 1 x 10  Pallof Press #5 1 x 10  Pallof   01/20/23:  Forward fold hamstring stretch 2 x 30 sec  Side Lying IT band stretch 2 x 30 sec   PATIENT EDUCATION:  Education details: form and technique for correct performance of exercise  Person educated: Patient Education method: Explanation, Demonstration, Verbal cues, and Handouts Education comprehension: verbalized understanding, returned demonstration, and verbal cues required  HOME EXERCISE PROGRAM: Access Code: [email or web address] URL: [email or web address] Date: 02/08/2023 Prepared by: Ellin Goodie  Exercises - Standing Forward Trunk Flexion  - 1 x daily - 3 reps - 30 sec   hold - Sidelying ITB Stretch off Table  - 1 x daily - 3 reps - 30 sec hold - Hip Flexor Stretch with Chair  - 1 x daily - 3 reps - 30 sec  hold - Sidelying Hip Abduction  - 3-4 x weekly - 3 sets - 10 reps - Standing March  - 3-4 x weekly - 3 sets - 10 reps ASSESSMENT:  CLINICAL IMPRESSION: Pt able to perform all hip strengthening exercises without an increase in her left sided low back pain. Hip abduction modified to include increased cushioning over MCL to avoid pain. Pt unable to perform supine modified thomas stretch due to vertigo that appears to BPPV with occurrence  with positional change.    OBJECTIVE IMPAIRMENTS: decreased ROM, decreased strength, obesity, and pain.   ACTIVITY LIMITATIONS: carrying, lifting, sitting, squatting, stairs, dressing, and locomotion level  PARTICIPATION LIMITATIONS: occupation and school  PERSONAL FACTORS: Past/current experiences and Time since onset of injury/illness/exacerbation are also affecting patient's functional outcome.   REHAB POTENTIAL: Fair h/o of msk issues and chronicity of low back pain   CLINICAL DECISION MAKING: Stable/uncomplicated  EVALUATION COMPLEXITY: Low   GOALS: Goals reviewed with patient? No  SHORT TERM GOALS: Target date: 02/03/2023  Pt will be independent with HEP in order to improve strength and balance in order to decrease fall risk and improve function at home and work. Baseline: NT  Goal status: ONGOING   2.  Patient will understand directional preferences and activity modifications to avoid low back pain exacerbation. Baseline: Unable to perform  Goal status: ONGOING    LONG TERM GOALS: Target date: 03/31/2023  Patient will have improved function and activity level as evidenced by an increase in FOTO score by >=5 points or more.  Baseline: 67 pts with target of 73    Goal status: ONGOING    2.  Patient will improve hip strength by 1/3 grade MMT (4- to 4) for improved lumbar stability and for symptom reduction.  Baseline: Hip Abd R/L 4/4, Hip Ext R/L 4/4 Goal status: ONGOING   3.  Patient will be able to sit for prolonged period of time without experiencing pain of >=3/10 NRPS to better be able to perform job related tasks.    Baseline: 5/10 NRPS Goal status: ONGOING   PLAN:  PT FREQUENCY: 1-2x/week  PT DURATION: 10 weeks  PLANNED INTERVENTIONS: Therapeutic exercises, Therapeutic activity, Neuromuscular re-education, Balance training, Gait training, Patient/Family education, Self Care, Joint mobilization, Joint manipulation, Aquatic Therapy, Dry Needling, Electrical  stimulation, Spinal manipulation, Spinal mobilization, Cryotherapy, Moist heat, Manual therapy, and Re-evaluation.  PLAN FOR NEXT SESSION: Progress hip strengthening exercises: Bird Dog and straight leg raise or standing bird dog    Ellin Goodie PT, DPT  [facility] [address] [place], [address] Phone: [phone]   Fax:  [phone]

## 2023-02-11 ENCOUNTER — Encounter: Payer: BC Managed Care – PPO | Admitting: Physical Therapy

## 2023-02-15 ENCOUNTER — Ambulatory Visit: Payer: BC Managed Care – PPO | Admitting: Physical Therapy

## 2023-02-15 DIAGNOSIS — M5459 Other low back pain: Secondary | ICD-10-CM

## 2023-02-15 DIAGNOSIS — G8929 Other chronic pain: Secondary | ICD-10-CM

## 2023-02-15 NOTE — Therapy (Signed)
OUTPATIENT PHYSICAL THERAPY THORACOLUMBAR TREATMENT    Patient Name: Tina Fleming MRN: [id number] DOB:[birth year], 42 y.o., female Today's Date: 02/15/2023  END OF SESSION:  PT End of Session - 02/15/23 1438     Visit Number 4    Number of Visits 20    Date for PT Re-Evaluation 03/31/23    Authorization Type [id number]    Authorization Time Period 01/20/23-03/31/2023    Authorization - Visit Number 4    Authorization - Number of Visits 20    Progress Note Due on Visit 10    PT Start Time 1435    PT Stop Time 1515    PT Time Calculation (min) 40 min    Activity Tolerance Patient tolerated treatment well    Behavior During Therapy [facility] for tasks assessed/performed              Past Medical History:  Diagnosis Date   Bilateral occipital neuralgia    Complication of anesthesia    hard to wake due to  prolonged sedation   DUB (dysfunctional uterine bleeding)    Heart murmur    per pt asymtomatic, dx age 30 and told had normal echo   History of 2019 novel coronavirus disease (COVID-19) 05/2018   per pt mild symptoms that resolved   History of kidney stones    History of urethral stricture    age 69 s/p dilatation   IBS (irritable bowel syndrome)    IDA (iron deficiency anemia)    Intractable migraine with aura without status migrainosus    neruologist--- dr h. Sherryll Burger   Thyroid nodule    per pt had biopsy of nodule approx. 2019 or 2020 , benign , and no follow up needed   Vitamin D deficiency    Past Surgical History:  Procedure Laterality Date   DILATATION & CURETTAGE/HYSTEROSCOPY WITH MYOSURE N/A 03/20/2016   Procedure: DILATATION & CURETTAGE/HYSTEROSCOPY WITH MYOSURE;  Surgeon: Maxie Better, MD;  Location: [facility];  Service: Gynecology;  Laterality: N/A;  45 min. requested   DILATATION & CURETTAGE/HYSTEROSCOPY WITH MYOSURE N/A 12/06/2020   Procedure: DILATATION & CURETTAGE/DIAGNOSTIC HYSTEROSCOPY/ HYSTEROSCOPIC RESECTION OF ENDOMETRIAL POLYP USING MYOSURE;   Surgeon: Maxie Better, MD;  Location: [facility];  Service: Gynecology;  Laterality: N/A;   EXTRACORPOREAL SHOCK WAVE LITHOTRIPSY  04/18/2013   @[facility]    EXTRACORPOREAL SHOCK WAVE LITHOTRIPSY Right 07/07/2021   Procedure: RIGHT EXTRACORPOREAL SHOCK WAVE LITHOTRIPSY (ESWL);  Surgeon: Crista Elliot, MD;  Location: [facility];  Service: Urology;  Laterality: Right;   KNEE ARTHROSCOPY  04/24/2011   Procedure: ARTHROSCOPY KNEE;  Surgeon: Javier Docker;  [facility]:  [facility];  Service: Orthopedics;  Laterality: Right;  /Arthroscopy with debridement, right knee   LAPAROSCOPIC APPENDECTOMY  03/21/2012   Procedure: APPENDECTOMY LAPAROSCOPIC;  Surgeon: Clovis Pu. Cornett, MD;  Location: [facility];  Service: General;  Laterality: N/A;   URETHRAL STRICTURE DILATATION     age 89   Patient Active Problem List   Diagnosis Date Noted   Sacral mass 06/11/2022   Genetic testing 02/16/2022   Obesity (BMI 35.0-39.9 without comorbidity) 09/21/2021   Iron deficiency anemia 04/11/2021   Abdominal pain 03/31/2021   Traumatic ecchymosis of right lower leg 03/23/2020   Hematoma of right lower extremity 03/23/2020   Nausea 08/21/2019   Abdominal bloating 08/21/2019   History of COVID-19 06/24/2019   URI (upper respiratory infection) 08/26/2018   Thyroid nodule 02/27/2018   Intractable migraine with aura with status  migrainosus 05/14/2017   Stress 01/10/2017   DUB (dysfunctional uterine bleeding) 07/16/2016   Low back pain 04/30/2015   Fatigue 02/08/2015   Skin lesion of breast 08/06/2014   Health care maintenance 08/06/2014   Nephrolithiasis 02/11/2013   Migraine 02/11/2013   History of frequent urinary tract infections 02/11/2013   Syncope 02/11/2013   IBS (irritable bowel syndrome) 02/11/2013   Anemia 02/11/2013   Vitamin D deficiency 02/11/2013    PCP: Dr. Dale [place]   REFERRING PROVIDER: Dr. Donalee Citrin   REFERRING DIAG: M51.26 (ICD-10-CM) -  Other intervertebral disc displacement, lumbar region  Rationale for Evaluation and Treatment: Rehabilitation  THERAPY DIAG:  Other low back pain  Chronic left SI joint pain  ONSET DATE: 2 mo from now, 04/22/23   SUBJECTIVE:                                                                                                                                                                                           SUBJECTIVE STATEMENT: Pt reports that she was doing well until this past weekend where she felt increased back pain on Saturday to the point where she had to lay down.    PERTINENT HISTORY:  Pt started to develop bilateral hip pain months ago which she thought was LE fatigue. Pt had steriod joint injection in L4-L5 back in July, with little to no relief. She came to find out that it was a radiating pain from her spine that has worsened over time. She experiences most increase in her pain with prolonged sitting and this has limited her from being able to participate in work events like commencement. She wants to be able to sit for longer periods of time without pain worsening.   PAIN:  Are you having pain? No  PRECAUTIONS: None  RED FLAGS: None   WEIGHT BEARING RESTRICTIONS: No  FALLS:  Has patient fallen in last 6 months? No  LIVING ENVIRONMENT: Lives with: lives alone Lives in: House/apartment Stairs: Yes: External: 4 steps; on right going up and on left going up Has following equipment at home: None  OCCUPATION: Nursing Instructor   PLOF: Independent  PATIENT GOALS: To avoid surgery and to relieve low back pain   NEXT MD VISIT: October 2024   OBJECTIVE:   VITALS: BP 125/77 HR 79 SpO2 99  DIAGNOSTIC FINDINGS:  No recent lumbar images from the past couple of years   PATIENT SURVEYS:  FOTO 67/100 with target of 73   SCREENING FOR RED FLAGS: Bowel or bladder incontinence: No Spinal tumors: No Cauda equina syndrome: No Compression fracture: No Abdominal  aneurysm: No  COGNITION: Overall  cognitive status: Within functional limits for tasks assessed     SENSATION: WFL  MUSCLE LENGTH: Hamstrings: Right 70 deg; Left 70 deg Thomas test: Negative bilaterally  POSTURE: No Significant postural limitations  PALPATION: Left paraspinal   LUMBAR ROM:   AROM eval  Flexion 90%  Extension 100%  Right lateral flexion 100%  Left lateral flexion 100%  Right rotation 100%  Left rotation 100%   (Blank rows = not tested)  LOWER EXTREMITY ROM:     Active  Right eval Left eval  Hip flexion    Hip extension    Hip abduction    Hip adduction    Hip internal rotation    Hip external rotation    Knee flexion    Knee extension    Ankle dorsiflexion    Ankle plantarflexion    Ankle inversion    Ankle eversion     (Blank rows = not tested)  LOWER EXTREMITY MMT:    MMT Right eval Left eval  Hip flexion 4+ 4+  Hip extension 4 4  Hip abduction 4 4-  Hip adduction 4 4  Hip internal rotation    Hip external rotation    Knee flexion 4 4  Knee extension 4 4  Ankle dorsiflexion    Ankle plantarflexion    Ankle inversion    Ankle eversion     (Blank rows = not tested)  LUMBAR SPECIAL TESTS:  Straight leg raise test: Negative, SI Compression/distraction test: Negative, FABER test: Negative, and FADIR Negative   FUNCTIONAL TESTS:  None performed   GAIT: Distance walked: 50 ft  Assistive device utilized: None Level of assistance: Complete Independence Comments: No gait deficits noted   TODAY'S TREATMENT:                                                                                                                              DATE:   02/15/23: Matrix Recumbent Bicycle with level 3 resistance and seat at 9 for 5 min  Quadruped on elbows with contralateral hip and knee extension 3 x 10  Standing Marches with Shoulder Flexion against wall 1 x 10  Standing Marches with Shoulder Flexion away from wall 1 x 10  Standing Marches  with Shoulder Flexion away from wall #1 DB 1 x 10  Standing Marches with Shoulder Flexion away from wall #2 DB 1 x 10   02/08/23: Nu-Step with seat and arms at 8 for 5 min and resistance at 4   OMEGA Leg Press #55 3 x 10  OMEGA Standing Hip Adduction #40 with arm set to 3- 2 x10 OMEGA Standing Hip Adduction #55 with pillow around arm of machine set to prevent irritation 4 x 10  Standing Marches #5 DB 3 x 10  Standing Hip Flexor Stretch on Step with BUE support 3 x 30 sec  Modified Thomas Stretch 2 x 10 sec  -Pt reports increased dizziness when attempting exercise   01/16/23  MODALITY UNBILLED  Performed by: Ellin Goodie, PT, DPT Trigger Point Dry-Needling  Treatment instructions: Expect mild to moderate muscle soreness. S/S of pneumothorax if dry needled over a lung field, and to seek immediate medical attention should they occur. Patient verbalized understanding of these instructions and education.  Patient Consent Given: Yes Muscles treated: Left multifidus  Electrical stimulation performed: No Parameters: N/A Treatment response/outcome: No twitch  Needles Size: 0.25 x 60 mm   THEREX   Side Lying Hip Abduction 2 x 10  Side Lying Hip Abduction #2 DB 1 x 10  Pallof Press #5 1 x 10  Pallof   01/20/23:  Forward fold hamstring stretch 2 x 30 sec  Side Lying IT band stretch 2 x 30 sec   PATIENT EDUCATION:  Education details: form and technique for correct performance of exercise  Person educated: Patient Education method: Explanation, Demonstration, Verbal cues, and Handouts Education comprehension: verbalized understanding, returned demonstration, and verbal cues required  HOME EXERCISE PROGRAM: Access Code: [email or web address] URL: [email or web address] Date: 02/15/2023 Prepared by: Ellin Goodie  Exercises - Standing Forward Trunk Flexion  - 1 x daily - 3 reps - 30 sec   hold - Sidelying ITB Stretch off Table  - 1 x daily - 3 reps - 30 sec hold - Hip Flexor  Stretch with Chair  - 1 x daily - 3 reps - 30 sec  hold - Sidelying Hip Abduction  - 3-4 x weekly - 3 sets - 10 reps - Standing March with Alternating Shoulder Press   - 3-4 x weekly - 3 sets - 10 reps - Bird Dog  - 3-4 x weekly - 3 sets - 10 reps  ASSESSMENT:  CLINICAL IMPRESSION: Pt continues to demonstrate improvement with hip strength with ability to perform quadruped hip extension and standing marches with resistance and shoulder flexion. She did not experience an increase in her low back pain during the session. She will continue to benefit from skilled PT to improve hip strength and decrease low back pain.     OBJECTIVE IMPAIRMENTS: decreased ROM, decreased strength, obesity, and pain.   ACTIVITY LIMITATIONS: carrying, lifting, sitting, squatting, stairs, dressing, and locomotion level  PARTICIPATION LIMITATIONS: occupation and school  PERSONAL FACTORS: Past/current experiences and Time since onset of injury/illness/exacerbation are also affecting patient's functional outcome.   REHAB POTENTIAL: Fair h/o of msk issues and chronicity of low back pain   CLINICAL DECISION MAKING: Stable/uncomplicated  EVALUATION COMPLEXITY: Low   GOALS: Goals reviewed with patient? No  SHORT TERM GOALS: Target date: 02/03/2023  Pt will be independent with HEP in order to improve strength and balance in order to decrease fall risk and improve function at home and work. Baseline: NT 02/15/23: Able to perform independently  Goal status: Achieved   2.  Patient will understand directional preferences and activity modifications to avoid low back pain exacerbation. Baseline: Unable to perform  Goal status: ONGOING    LONG TERM GOALS: Target date: 03/31/2023  Patient will have improved function and activity level as evidenced by an increase in FOTO score by >=5 points or more.  Baseline: 67 pts with target of 73    Goal status: ONGOING    2.  Patient will improve hip strength by 1/3 grade MMT  (4- to 4) for improved lumbar stability and for symptom reduction.  Baseline: Hip Abd R/L 4/4, Hip Ext R/L 4/4 Goal status: ONGOING   3.  Patient will be able to sit for prolonged period of time without experiencing  pain of >=3/10 NRPS to better be able to perform job related tasks.    Baseline: 5/10 NRPS Goal status: ONGOING   PLAN:  PT FREQUENCY: 1-2x/week  PT DURATION: 10 weeks  PLANNED INTERVENTIONS: Therapeutic exercises, Therapeutic activity, Neuromuscular re-education, Balance training, Gait training, Patient/Family education, Self Care, Joint mobilization, Joint manipulation, Aquatic Therapy, Dry Needling, Electrical stimulation, Spinal manipulation, Spinal mobilization, Cryotherapy, Moist heat, Manual therapy, and Re-evaluation.  PLAN FOR NEXT SESSION: Progress side lying hip abduction and marches with increased resistance. Sled Push    Ellin Goodie PT, DPT  [facility] [address], [place], [address] Phone: [phone]   Fax:  [phone]

## 2023-02-18 ENCOUNTER — Encounter: Payer: BC Managed Care – PPO | Admitting: Physical Therapy

## 2023-02-23 ENCOUNTER — Ambulatory Visit: Payer: BC Managed Care – PPO | Admitting: Physical Therapy

## 2023-02-25 ENCOUNTER — Encounter: Payer: BC Managed Care – PPO | Admitting: Physical Therapy

## 2023-03-01 ENCOUNTER — Ambulatory Visit: Payer: BC Managed Care – PPO | Attending: Neurosurgery | Admitting: Physical Therapy

## 2023-03-01 DIAGNOSIS — M5459 Other low back pain: Secondary | ICD-10-CM | POA: Diagnosis present

## 2023-03-01 DIAGNOSIS — M533 Sacrococcygeal disorders, not elsewhere classified: Secondary | ICD-10-CM | POA: Insufficient documentation

## 2023-03-01 DIAGNOSIS — G8929 Other chronic pain: Secondary | ICD-10-CM | POA: Diagnosis present

## 2023-03-01 NOTE — Therapy (Signed)
OUTPATIENT PHYSICAL THERAPY THORACOLUMBAR TREATMENT    Patient Name: Tina Fleming MRN: [id number] DOB:01/14/1981, 42 y.o., female Today's Date: 03/01/2023  END OF SESSION:  PT End of Session - 03/01/23 0826     Visit Number 5    Number of Visits 20    Date for PT Re-Evaluation 03/31/23    Authorization Type [id number]    Authorization Time Period 01/20/23-03/31/2023    Authorization - Visit Number 5    Authorization - Number of Visits 20    Progress Note Due on Visit 10    PT Start Time 0820    PT Stop Time 0900    PT Time Calculation (min) 40 min    Activity Tolerance Patient tolerated treatment well    Behavior During Therapy [facility] for tasks assessed/performed               Past Medical History:  Diagnosis Date   Bilateral occipital neuralgia    Complication of anesthesia    hard to wake due to  prolonged sedation   DUB (dysfunctional uterine bleeding)    Heart murmur    per pt asymtomatic, dx age 38 and told had normal echo   History of 2019 novel coronavirus disease (COVID-19) 05/2018   per pt mild symptoms that resolved   History of kidney stones    History of urethral stricture    age 20 s/p dilatation   IBS (irritable bowel syndrome)    IDA (iron deficiency anemia)    Intractable migraine with aura without status migrainosus    neruologist--- dr h. Sherryll Burger   Thyroid nodule    per pt had biopsy of nodule approx. 2019 or 2020 , benign , and no follow up needed   Vitamin D deficiency    Past Surgical History:  Procedure Laterality Date   DILATATION & CURETTAGE/HYSTEROSCOPY WITH MYOSURE N/A 03/20/2016   Procedure: DILATATION & CURETTAGE/HYSTEROSCOPY WITH MYOSURE;  Surgeon: Maxie Better, MD;  Location: [facility];  Service: Gynecology;  Laterality: N/A;  45 min. requested   DILATATION & CURETTAGE/HYSTEROSCOPY WITH MYOSURE N/A 12/06/2020   Procedure: DILATATION & CURETTAGE/DIAGNOSTIC HYSTEROSCOPY/ HYSTEROSCOPIC RESECTION OF ENDOMETRIAL POLYP USING MYOSURE;   Surgeon: Maxie Better, MD;  Location: [facility] [place];  Service: Gynecology;  Laterality: N/A;   EXTRACORPOREAL SHOCK WAVE LITHOTRIPSY  04/18/2013   @[facility]    EXTRACORPOREAL SHOCK WAVE LITHOTRIPSY Right 07/07/2021   Procedure: RIGHT EXTRACORPOREAL SHOCK WAVE LITHOTRIPSY (ESWL);  Surgeon: Crista Elliot, MD;  Location: [facility];  Service: Urology;  Laterality: Right;   KNEE ARTHROSCOPY  04/24/2011   Procedure: ARTHROSCOPY KNEE;  Surgeon: Javier Docker;  Location:  [facility];  Service: Orthopedics;  Laterality: Right;  /Arthroscopy with debridement, right knee   LAPAROSCOPIC APPENDECTOMY  03/21/2012   Procedure: APPENDECTOMY LAPAROSCOPIC;  Surgeon: Clovis Pu. Cornett, MD;  Location: [facility];  Service: General;  Laterality: N/A;   URETHRAL STRICTURE DILATATION     age 20   Patient Active Problem List   Diagnosis Date Noted   Sacral mass 06/11/2022   Genetic testing 02/16/2022   Obesity (BMI 35.0-39.9 without comorbidity) 09/21/2021   Iron deficiency anemia 04/11/2021   Abdominal pain 03/31/2021   Traumatic ecchymosis of right lower leg 03/23/2020   Hematoma of right lower extremity 03/23/2020   Nausea 08/21/2019   Abdominal bloating 08/21/2019   History of COVID-19 06/24/2019   URI (upper respiratory infection) 08/26/2018   Thyroid nodule 02/27/2018   Intractable migraine with aura with  status migrainosus 05/14/2017   Stress 01/10/2017   DUB (dysfunctional uterine bleeding) 07/16/2016   Low back pain 04/30/2015   Fatigue 02/08/2015   Skin lesion of breast 08/06/2014   Health care maintenance 08/06/2014   Nephrolithiasis 02/11/2013   Migraine 02/11/2013   History of frequent urinary tract infections 02/11/2013   Syncope 02/11/2013   IBS (irritable bowel syndrome) 02/11/2013   Anemia 02/11/2013   Vitamin D deficiency 02/11/2013    PCP: Dr. Dale Olinda   REFERRING PROVIDER: Dr. Donalee Citrin   REFERRING DIAG: M51.26 (ICD-10-CM) -  Other intervertebral disc displacement, lumbar region  Rationale for Evaluation and Treatment: Rehabilitation  THERAPY DIAG:  No diagnosis found.  ONSET DATE: 2 mo from now, 04/22/23   SUBJECTIVE:                                                                                                                                                                                           SUBJECTIVE STATEMENT: Pt states that she recently saw Dr. Wynetta Emery and they decided to continue with physical therapy to avoid surgery.    PERTINENT HISTORY:  Pt started to develop bilateral hip pain months ago which she thought was LE fatigue. Pt had steriod joint injection in L4-L5 back in July, with little to no relief. She came to find out that it was a radiating pain from her spine that has worsened over time. She experiences most increase in her pain with prolonged sitting and this has limited her from being able to participate in work events like commencement. She wants to be able to sit for longer periods of time without pain worsening.   PAIN:  Are you having pain? No  PRECAUTIONS: None  RED FLAGS: None   WEIGHT BEARING RESTRICTIONS: No  FALLS:  Has patient fallen in last 6 months? No  LIVING ENVIRONMENT: Lives with: lives alone Lives in: House/apartment Stairs: Yes: External: 4 steps; on right going up and on left going up Has following equipment at home: None  OCCUPATION: Nursing Instructor   PLOF: Independent  PATIENT GOALS: To avoid surgery and to relieve low back pain   NEXT MD VISIT: October 2024   OBJECTIVE:   VITALS: BP 125/77 HR 79 SpO2 99  DIAGNOSTIC FINDINGS:  No recent lumbar images from the past couple of years   PATIENT SURVEYS:  FOTO 67/100 with target of 73   SCREENING FOR RED FLAGS: Bowel or bladder incontinence: No Spinal tumors: No Cauda equina syndrome: No Compression fracture: No Abdominal aneurysm: No  COGNITION: Overall cognitive status: Within  functional limits for tasks assessed     SENSATION: [facility]  MUSCLE LENGTH: Hamstrings: Right 70 deg; Left 70 deg Thomas test: Negative bilaterally  POSTURE: No Significant postural limitations  PALPATION: Left paraspinal   LUMBAR ROM:   AROM eval  Flexion 90%  Extension 100%  Right lateral flexion 100%  Left lateral flexion 100%  Right rotation 100%  Left rotation 100%   (Blank rows = not tested)  LOWER EXTREMITY ROM:     Active  Right eval Left eval  Hip flexion    Hip extension    Hip abduction    Hip adduction    Hip internal rotation    Hip external rotation    Knee flexion    Knee extension    Ankle dorsiflexion    Ankle plantarflexion    Ankle inversion    Ankle eversion     (Blank rows = not tested)  LOWER EXTREMITY MMT:    MMT Right eval Left eval  Hip flexion 4+ 4+  Hip extension 4 4  Hip abduction 4 4-  Hip adduction 4 4  Hip internal rotation    Hip external rotation    Knee flexion 4 4  Knee extension 4 4  Ankle dorsiflexion    Ankle plantarflexion    Ankle inversion    Ankle eversion     (Blank rows = not tested)  LUMBAR SPECIAL TESTS:  Straight leg raise test: Negative, SI Compression/distraction test: Negative, FABER test: Negative, and FADIR Negative   FUNCTIONAL TESTS:  None performed   GAIT: Distance walked: 50 ft  Assistive device utilized: None Level of assistance: Complete Independence Comments: No gait deficits noted   TODAY'S TREATMENT:                                                                                                                              DATE:   03/01/23: Nu-Step seat 11 and resistance 3  or 5 min  Bird Dog with forward lean over table 3 x 10  -Remove shoulder flexion for improved exercise tolerance.  Side Lying Hip Abduction against wall 3 x 10  Standing Marches #2 AW in corner of room 2 x 10    02/15/23: Matrix Recumbent Bicycle with level 3 resistance and seat at 9 for 5 min  Quadruped  on elbows with contralateral hip and knee extension 3 x 10  Standing Marches with Shoulder Flexion against wall 1 x 10  Standing Marches with Shoulder Flexion away from wall 1 x 10  Standing Marches with Shoulder Flexion away from wall #1 DB 1 x 10  Standing Marches with Shoulder Flexion away from wall #2 DB 1 x 10   02/08/23: Nu-Step with seat and arms at 8 for 5 min and resistance at 4   OMEGA Leg Press #55 3 x 10  OMEGA Standing Hip Adduction #40 with arm set to 3- 2 x10 OMEGA Standing Hip Adduction #55 with pillow around arm of machine set to prevent irritation 4 x 10  Standing Marches #5 DB 3 x 10  Standing Hip Flexor Stretch on Step with BUE support 3 x 30 sec  Modified Thomas Stretch 2 x 10 sec  -Pt reports increased dizziness when attempting exercise   01/16/23  MODALITY UNBILLED  Performed by: Ellin Goodie, PT, DPT Trigger Point Dry-Needling  Treatment instructions: Expect mild to moderate muscle soreness. S/S of pneumothorax if dry needled over a lung field, and to seek immediate medical attention should they occur. Patient verbalized understanding of these instructions and education.  Patient Consent Given: Yes Muscles treated: Left multifidus  Electrical stimulation performed: No Parameters: N/A Treatment response/outcome: No twitch  Needles Size: 0.25 x 60 mm   THEREX   Side Lying Hip Abduction 2 x 10  Side Lying Hip Abduction #2 DB 1 x 10  Pallof Press #5 1 x 10  Pallof   01/20/23:  Forward fold hamstring stretch 2 x 30 sec  Side Lying IT band stretch 2 x 30 sec   PATIENT EDUCATION:  Education details: form and technique for correct performance of exercise  Person educated: Patient Education method: Explanation, Demonstration, Verbal cues, and Handouts Education comprehension: verbalized understanding, returned demonstration, and verbal cues required  HOME EXERCISE PROGRAM: Access Code: [email or web address] URL: [email or web address] Date:  03/01/2023 Prepared by: Ellin Goodie  Exercises - Standing Forward Trunk Flexion  - 1 x daily - 3 reps - 30 sec   hold - Sidelying ITB Stretch off Table  - 1 x daily - 3 reps - 30 sec hold - Hip Flexor Stretch with Chair  - 1 x daily - 3 reps - 30 sec  hold - Sidelying Hip Abduction  - 3-4 x weekly - 3 sets - 10 reps - Bird Dog on Counter  - 3-4 x weekly - 3 sets - 10 reps - Standing March  - 3-4 x weekly - 3 sets - 10 reps  ASSESSMENT:  CLINICAL IMPRESSION: Pt shows improved ability to perform hip strengthening exercises with removal of UE component. She required min cues to maintain decreased UE support from the wall for increased trunk activation. She will continue to benefit from skilled PT to improve hip strength and decrease low back pain.    OBJECTIVE IMPAIRMENTS: decreased ROM, decreased strength, obesity, and pain.   ACTIVITY LIMITATIONS: carrying, lifting, sitting, squatting, stairs, dressing, and locomotion level  PARTICIPATION LIMITATIONS: occupation and school  PERSONAL FACTORS: Past/current experiences and Time since onset of injury/illness/exacerbation are also affecting patient's functional outcome.   REHAB POTENTIAL: Fair h/o of msk issues and chronicity of low back pain   CLINICAL DECISION MAKING: Stable/uncomplicated  EVALUATION COMPLEXITY: Low   GOALS: Goals reviewed with patient? No  SHORT TERM GOALS: Target date: 02/03/2023  Pt will be independent with HEP in order to improve strength and balance in order to decrease fall risk and improve function at home and work. Baseline: NT 02/15/23: Able to perform independently  Goal status: Achieved   2.  Patient will understand directional preferences and activity modifications to avoid low back pain exacerbation. Baseline: Unable to perform  Goal status: ONGOING    LONG TERM GOALS: Target date: 03/31/2023  Patient will have improved function and activity level as evidenced by an increase in FOTO score by  >=5 points or more.  Baseline: 67 pts with target of 73    Goal status: ONGOING    2.  Patient will improve hip strength by 1/3 grade MMT (4- to 4) for improved lumbar stability and for symptom reduction.  Baseline: Hip Abd R/L 4/4, Hip Ext R/L 4/4 Goal status: ONGOING   3.  Patient will be able to sit for prolonged period of time without experiencing pain of >=3/10 NRPS to better be able to perform job related tasks.    Baseline: 5/10 NRPS Goal status: ONGOING   PLAN:  PT FREQUENCY: 1-2x/week  PT DURATION: 10 weeks  PLANNED INTERVENTIONS: Therapeutic exercises, Therapeutic activity, Neuromuscular re-education, Balance training, Gait training, Patient/Family education, Self Care, Joint mobilization, Joint manipulation, Aquatic Therapy, Dry Needling, Electrical stimulation, Spinal manipulation, Spinal mobilization, Cryotherapy, Moist heat, Manual therapy, and Re-evaluation.  PLAN FOR NEXT SESSION: Continue to progress hip strengthening exercises with obstacle step over and side stepping.    Ellin Goodie PT, DPT  [facility] [address], [place], [address] Phone: [phone]   Fax:  [phone]

## 2023-03-03 ENCOUNTER — Encounter: Payer: BC Managed Care – PPO | Admitting: Physical Therapy

## 2023-03-09 ENCOUNTER — Ambulatory Visit: Payer: BC Managed Care – PPO | Admitting: Physical Therapy

## 2023-03-09 DIAGNOSIS — G8929 Other chronic pain: Secondary | ICD-10-CM

## 2023-03-09 DIAGNOSIS — M5459 Other low back pain: Secondary | ICD-10-CM

## 2023-03-09 NOTE — Therapy (Signed)
OUTPATIENT PHYSICAL THERAPY THORACOLUMBAR TREATMENT    Patient Name: Tina Fleming MRN: [id number] DOB:02-14-1981, 42 y.o., female Today's Date: 03/09/2023  END OF SESSION:  PT End of Session - 03/09/23 1431     Visit Number 6    Number of Visits 20    Date for PT Re-Evaluation 03/31/23    Authorization Type [id number]    Authorization Time Period 01/20/23-03/31/2023    Authorization - Visit Number 6    Authorization - Number of Visits 20    Progress Note Due on Visit 10    PT Start Time 1345    PT Stop Time 1430    PT Time Calculation (min) 45 min    Activity Tolerance Patient tolerated treatment well    Behavior During Therapy [facility] for tasks assessed/performed                Past Medical History:  Diagnosis Date   Bilateral occipital neuralgia    Complication of anesthesia    hard to wake due to  prolonged sedation   DUB (dysfunctional uterine bleeding)    Heart murmur    per pt asymtomatic, dx age 18 and told had normal echo   History of 2019 novel coronavirus disease (COVID-19) 05/2018   per pt mild symptoms that resolved   History of kidney stones    History of urethral stricture    age 22 s/p dilatation   IBS (irritable bowel syndrome)    IDA (iron deficiency anemia)    Intractable migraine with aura without status migrainosus    neruologist--- dr h. Sherryll Burger   Thyroid nodule    per pt had biopsy of nodule approx. 2019 or 2020 , benign , and no follow up needed   Vitamin D deficiency    Past Surgical History:  Procedure Laterality Date   DILATATION & CURETTAGE/HYSTEROSCOPY WITH MYOSURE N/A 03/20/2016   Procedure: DILATATION & CURETTAGE/HYSTEROSCOPY WITH MYOSURE;  Surgeon: Maxie Better, MD;  Location: [facility];  Service: Gynecology;  Laterality: N/A;  45 min. requested   DILATATION & CURETTAGE/HYSTEROSCOPY WITH MYOSURE N/A 12/06/2020   Procedure: DILATATION & CURETTAGE/DIAGNOSTIC HYSTEROSCOPY/ HYSTEROSCOPIC RESECTION OF ENDOMETRIAL POLYP USING MYOSURE;   Surgeon: Maxie Better, MD;  Location: [facility];  Service: Gynecology;  Laterality: N/A;   EXTRACORPOREAL SHOCK WAVE LITHOTRIPSY  04/18/2013   @[facility]    EXTRACORPOREAL SHOCK WAVE LITHOTRIPSY Right 07/07/2021   Procedure: RIGHT EXTRACORPOREAL SHOCK WAVE LITHOTRIPSY (ESWL);  Surgeon: Crista Elliot, MD;  Location: [facility];  Service: Urology;  Laterality: Right;   KNEE ARTHROSCOPY  04/24/2011   Procedure: ARTHROSCOPY KNEE;  Surgeon: Javier Docker;  Location: [facility];  Service: Orthopedics;  Laterality: Right;  /Arthroscopy with debridement, right knee   LAPAROSCOPIC APPENDECTOMY  03/21/2012   Procedure: APPENDECTOMY LAPAROSCOPIC;  Surgeon: Clovis Pu. Cornett, MD;  Location: [facility];  Service: General;  Laterality: N/A;   URETHRAL STRICTURE DILATATION     age 72   Patient Active Problem List   Diagnosis Date Noted   Sacral mass 06/11/2022   Genetic testing 02/16/2022   Obesity (BMI 35.0-39.9 without comorbidity) 09/21/2021   Iron deficiency anemia 04/11/2021   Abdominal pain 03/31/2021   Traumatic ecchymosis of right lower leg 03/23/2020   Hematoma of right lower extremity 03/23/2020   Nausea 08/21/2019   Abdominal bloating 08/21/2019   History of COVID-19 06/24/2019   URI (upper respiratory infection) 08/26/2018   Thyroid nodule 02/27/2018   Intractable migraine with aura  with status migrainosus 05/14/2017   Stress 01/10/2017   DUB (dysfunctional uterine bleeding) 07/16/2016   Low back pain 04/30/2015   Fatigue 02/08/2015   Skin lesion of breast 08/06/2014   Health care maintenance 08/06/2014   Nephrolithiasis 02/11/2013   Migraine 02/11/2013   History of frequent urinary tract infections 02/11/2013   Syncope 02/11/2013   IBS (irritable bowel syndrome) 02/11/2013   Anemia 02/11/2013   Vitamin D deficiency 02/11/2013    PCP: Dr. Dale [place]   REFERRING PROVIDER: Dr. Donalee Citrin   REFERRING DIAG: M51.26 (ICD-10-CM) -  Other intervertebral disc displacement, lumbar region  Rationale for Evaluation and Treatment: Rehabilitation  THERAPY DIAG:  Other low back pain  Chronic left SI joint pain  ONSET DATE: 2 mo from now, 04/22/23   SUBJECTIVE:                                                                                                                                                                                           SUBJECTIVE STATEMENT: Pt reports that she continues to have increased symptoms when sitting or walking for longer periods of time. She is concerned about next week when she has to start for    PERTINENT HISTORY:  Pt started to develop bilateral hip pain months ago which she thought was LE fatigue. Pt had steriod joint injection in L4-L5 back in July, with little to no relief. She came to find out that it was a radiating pain from her spine that has worsened over time. She experiences most increase in her pain with prolonged sitting and this has limited her from being able to participate in work events like commencement. She wants to be able to sit for longer periods of time without pain worsening.   PAIN:  Are you having pain? No  PRECAUTIONS: None  RED FLAGS: None   WEIGHT BEARING RESTRICTIONS: No  FALLS:  Has patient fallen in last 6 months? No  LIVING ENVIRONMENT: Lives with: lives alone Lives in: House/apartment Stairs: Yes: External: 4 steps; on right going up and on left going up Has following equipment at home: None  OCCUPATION: Nursing Instructor   PLOF: Independent  PATIENT GOALS: To avoid surgery and to relieve low back pain   NEXT MD VISIT: October 2024   OBJECTIVE:   VITALS: BP 125/77 HR 79 SpO2 99  DIAGNOSTIC FINDINGS:  No recent lumbar images from the past couple of years   PATIENT SURVEYS:  FOTO 67/100 with target of 73   SCREENING FOR RED FLAGS: Bowel or bladder incontinence: No Spinal tumors: No Cauda equina syndrome: No Compression  fracture: No Abdominal aneurysm:  No  COGNITION: Overall cognitive status: Within functional limits for tasks assessed     SENSATION: WFL  MUSCLE LENGTH: Hamstrings: Right 70 deg; Left 70 deg Thomas test: Negative bilaterally  POSTURE: No Significant postural limitations  PALPATION: Left paraspinal   LUMBAR ROM:   AROM eval  Flexion 90%  Extension 100%  Right lateral flexion 100%  Left lateral flexion 100%  Right rotation 100%  Left rotation 100%   (Blank rows = not tested)  LOWER EXTREMITY ROM:     Active  Right eval Left eval  Hip flexion    Hip extension    Hip abduction    Hip adduction    Hip internal rotation    Hip external rotation    Knee flexion    Knee extension    Ankle dorsiflexion    Ankle plantarflexion    Ankle inversion    Ankle eversion     (Blank rows = not tested)  LOWER EXTREMITY MMT:    MMT Right eval Left eval  Hip flexion 4+ 4+  Hip extension 4 4  Hip abduction 4 4-  Hip adduction 4 4  Hip internal rotation    Hip external rotation    Knee flexion 4 4  Knee extension 4 4  Ankle dorsiflexion    Ankle plantarflexion    Ankle inversion    Ankle eversion     (Blank rows = not tested)  LUMBAR SPECIAL TESTS:  Straight leg raise test: Negative, SI Compression/distraction test: Negative, FABER test: Negative, and FADIR Negative   FUNCTIONAL TESTS:  None performed   GAIT: Distance walked: 50 ft  Assistive device utilized: None Level of assistance: Complete Independence Comments: No gait deficits noted   TODAY'S TREATMENT:                                                                                                                              DATE:   03/09/23: Matrix Recumbent Bicycle with level 4 resistance and seat at 11 for 5 min  Banded side step with yellow band 10 ft x 10  Sumo Squat with #15 Kettle bell 2 x 10  Good Morning with Yellow Band 1 x 10  -Pt reports increased low back pain  Good Morning with #15  KB 3 x 10  -mod VC for increased hip hinge.    03/01/23: Nu-Step seat 11 and resistance 3  or 5 min  Bird Dog with forward lean over table 3 x 10  -Remove shoulder flexion for improved exercise tolerance.  Side Lying Hip Abduction against wall 3 x 10  Standing Marches #2 AW in corner of room 2 x 10    02/15/23: Matrix Recumbent Bicycle with level 3 resistance and seat at 9 for 5 min  Quadruped on elbows with contralateral hip and knee extension 3 x 10  Standing Marches with Shoulder Flexion against wall 1 x 10  Standing Marches with Shoulder Flexion away from  wall 1 x 10  Standing Marches with Shoulder Flexion away from wall #1 DB 1 x 10  Standing Marches with Shoulder Flexion away from wall #2 DB 1 x 10   02/08/23: Nu-Step with seat and arms at 8 for 5 min and resistance at 4   OMEGA Leg Press #55 3 x 10  OMEGA Standing Hip Adduction #40 with arm set to 3- 2 x10 OMEGA Standing Hip Adduction #55 with pillow around arm of machine set to prevent irritation 4 x 10  Standing Marches #5 DB 3 x 10  Standing Hip Flexor Stretch on Step with BUE support 3 x 30 sec  Modified Thomas Stretch 2 x 10 sec  -Pt reports increased dizziness when attempting exercise     PATIENT EDUCATION:  Education details: form and technique for correct performance of exercise  Person educated: Patient Education method: Programmer, multimedia, Demonstration, Verbal cues, and Handouts Education comprehension: verbalized understanding, returned demonstration, and verbal cues required  HOME EXERCISE PROGRAM: Access Code: [email or web address] URL: [email or web address] Date: 03/09/2023 Prepared by: Ellin Goodie  Exercises - Standing Forward Trunk Flexion  - 1 x daily - 3 reps - 30 sec   hold - Sidelying ITB Stretch off Table  - 1 x daily - 3 reps - 30 sec hold - Hip Flexor Stretch with Chair  - 1 x daily - 3 reps - 30 sec  hold - Sidelying Hip Abduction  - 3-4 x weekly - 3 sets - 10 reps - Standing March  - 3-4 x  weekly - 3 sets - 10 reps - Sumo Squat with Dumbbell  - 1 x daily - 3-4 x weekly - 3 sets - 10 reps - Standing Good Morning with Barbell  - 3-4 x weekly - 3 sets - 10 reps  ASSESSMENT:  CLINICAL IMPRESSION: Pt continues to show tolerance for hip strengthening exercises which stress lumbar flexion instead of extension. Modified exercises to include decrease knee flexion to avoid increase knee pain as well as good morning with kettle bell instead of band to avoid increasing low back pain. She will continue to benefit from skilled PT to improve hip strength and decrease low back pain.   OBJECTIVE IMPAIRMENTS: decreased ROM, decreased strength, obesity, and pain.   ACTIVITY LIMITATIONS: carrying, lifting, sitting, squatting, stairs, dressing, and locomotion level  PARTICIPATION LIMITATIONS: occupation and school  PERSONAL FACTORS: Past/current experiences and Time since onset of injury/illness/exacerbation are also affecting patient's functional outcome.   REHAB POTENTIAL: Fair h/o of msk issues and chronicity of low back pain   CLINICAL DECISION MAKING: Stable/uncomplicated  EVALUATION COMPLEXITY: Low   GOALS: Goals reviewed with patient? No  SHORT TERM GOALS: Target date: 02/03/2023  Pt will be independent with HEP in order to improve strength and balance in order to decrease fall risk and improve function at home and work. Baseline: NT 02/15/23: Able to perform independently  Goal status: Achieved   2.  Patient will understand directional preferences and activity modifications to avoid low back pain exacerbation. Baseline: Unable to perform  Goal status: ONGOING    LONG TERM GOALS: Target date: 03/31/2023  Patient will have improved function and activity level as evidenced by an increase in FOTO score by >=5 points or more.  Baseline: 67 pts with target of 73    Goal status: ONGOING    2.  Patient will improve hip strength by 1/3 grade MMT (4- to 4) for improved lumbar  stability and for symptom reduction.  Baseline: Hip Abd R/L  4/4, Hip Ext R/L 4/4 Goal status: ONGOING   3.  Patient will be able to sit for prolonged period of time without experiencing pain of >=3/10 NRPS to better be able to perform job related tasks.    Baseline: 5/10 NRPS Goal status: ONGOING   PLAN:  PT FREQUENCY: 1-2x/week  PT DURATION: 10 weeks  PLANNED INTERVENTIONS: Therapeutic exercises, Therapeutic activity, Neuromuscular re-education, Balance training, Gait training, Patient/Family education, Self Care, Joint mobilization, Joint manipulation, Aquatic Therapy, Dry Needling, Electrical stimulation, Spinal manipulation, Spinal mobilization, Cryotherapy, Moist heat, Manual therapy, and Re-evaluation.  PLAN FOR NEXT SESSION: Continue to progress hip strengthening exercises with obstacle step over and side stepping and progress marches with overhead press.    Ellin Goodie PT, DPT  [facility] [address], [place], [address] Phone: [phone]   Fax:  [phone]

## 2023-03-11 ENCOUNTER — Encounter: Payer: BC Managed Care – PPO | Admitting: Physical Therapy

## 2023-03-15 ENCOUNTER — Ambulatory Visit: Payer: BC Managed Care – PPO | Admitting: Physical Therapy

## 2023-03-15 DIAGNOSIS — M5459 Other low back pain: Secondary | ICD-10-CM | POA: Diagnosis not present

## 2023-03-15 DIAGNOSIS — G8929 Other chronic pain: Secondary | ICD-10-CM

## 2023-03-15 NOTE — Therapy (Signed)
OUTPATIENT PHYSICAL THERAPY THORACOLUMBAR TREATMENT    Patient Name: Tina Fleming MRN: [id number] DOB:[birth year], 42 y.o., female Today's Date: 03/15/2023  END OF SESSION:  PT End of Session - 03/15/23 0823     Visit Number 7    Number of Visits 20    Date for PT Re-Evaluation 03/31/23    Authorization Type [id number]    Authorization Time Period 01/20/23-03/31/2023    Authorization - Visit Number 7    Authorization - Number of Visits 20    Progress Note Due on Visit 10    PT Start Time 0820    PT Stop Time 0900    PT Time Calculation (min) 40 min    Activity Tolerance Patient tolerated treatment well    Behavior During Therapy [facility] for tasks assessed/performed                Past Medical History:  Diagnosis Date   Bilateral occipital neuralgia    Complication of anesthesia    hard to wake due to  prolonged sedation   DUB (dysfunctional uterine bleeding)    Heart murmur    per pt asymtomatic, dx age 89 and told had normal echo   History of 2019 novel coronavirus disease (COVID-19) 05/2018   per pt mild symptoms that resolved   History of kidney stones    History of urethral stricture    age 47 s/p dilatation   IBS (irritable bowel syndrome)    IDA (iron deficiency anemia)    Intractable migraine with aura without status migrainosus    neruologist--- dr h. Sherryll Burger   Thyroid nodule    per pt had biopsy of nodule approx. 2019 or 2020 , benign , and no follow up needed   Vitamin D deficiency    Past Surgical History:  Procedure Laterality Date   DILATATION & CURETTAGE/HYSTEROSCOPY WITH MYOSURE N/A 03/20/2016   Procedure: DILATATION & CURETTAGE/HYSTEROSCOPY WITH MYOSURE;  Surgeon: Maxie Better, MD;  Location: [facility];  Service: Gynecology;  Laterality: N/A;  45 min. requested   DILATATION & CURETTAGE/HYSTEROSCOPY WITH MYOSURE N/A 12/06/2020   Procedure: DILATATION & CURETTAGE/DIAGNOSTIC HYSTEROSCOPY/ HYSTEROSCOPIC RESECTION OF ENDOMETRIAL POLYP USING MYOSURE;   Surgeon: Maxie Better, MD;  Location: [facility];  Service: Gynecology;  Laterality: N/A;   EXTRACORPOREAL SHOCK WAVE LITHOTRIPSY  04/18/2013   @[facility]    EXTRACORPOREAL SHOCK WAVE LITHOTRIPSY Right 07/07/2021   Procedure: RIGHT EXTRACORPOREAL SHOCK WAVE LITHOTRIPSY (ESWL);  Surgeon: Crista Elliot, MD;  Location: [facility];  Service: Urology;  Laterality: Right;   KNEE ARTHROSCOPY  04/24/2011   Procedure: ARTHROSCOPY KNEE;  Surgeon: Javier Docker;  Location: [facility];  Service: Orthopedics;  Laterality: Right;  /Arthroscopy with debridement, right knee   LAPAROSCOPIC APPENDECTOMY  03/21/2012   Procedure: APPENDECTOMY LAPAROSCOPIC;  Surgeon: Clovis Pu. Cornett, MD;  Location: [facility];  Service: General;  Laterality: N/A;   URETHRAL STRICTURE DILATATION     age 26   Patient Active Problem List   Diagnosis Date Noted   Sacral mass 06/11/2022   Genetic testing 02/16/2022   Obesity (BMI 35.0-39.9 without comorbidity) 09/21/2021   Iron deficiency anemia 04/11/2021   Abdominal pain 03/31/2021   Traumatic ecchymosis of right lower leg 03/23/2020   Hematoma of right lower extremity 03/23/2020   Nausea 08/21/2019   Abdominal bloating 08/21/2019   History of COVID-19 06/24/2019   URI (upper respiratory infection) 08/26/2018   Thyroid nodule 02/27/2018   Intractable migraine with aura  with status migrainosus 05/14/2017   Stress 01/10/2017   DUB (dysfunctional uterine bleeding) 07/16/2016   Low back pain 04/30/2015   Fatigue 02/08/2015   Skin lesion of breast 08/06/2014   Health care maintenance 08/06/2014   Nephrolithiasis 02/11/2013   Migraine 02/11/2013   History of frequent urinary tract infections 02/11/2013   Syncope 02/11/2013   IBS (irritable bowel syndrome) 02/11/2013   Anemia 02/11/2013   Vitamin D deficiency 02/11/2013    PCP: Dr. Dale Dover   REFERRING PROVIDER: Dr. Donalee Citrin   REFERRING DIAG: M51.26 (ICD-10-CM) -  Other intervertebral disc displacement, lumbar region  Rationale for Evaluation and Treatment: Rehabilitation  THERAPY DIAG:  Other low back pain  Chronic left SI joint pain  ONSET DATE: 2 mo from now, 04/22/23   SUBJECTIVE:                                                                                                                                                                                           SUBJECTIVE STATEMENT: Pt is concerned about being asked to work in clinical rotation without support. She has requested accommodations to avoid low back pain exacerbation.    Pt reports that she continues to have increased symptoms when sitting or walking for longer periods of time. She is concerned about next week when she has to start for    PERTINENT HISTORY:  Pt started to develop bilateral hip pain months ago which she thought was LE fatigue. Pt had steriod joint injection in L4-L5 back in July, with little to no relief. She came to find out that it was a radiating pain from her spine that has worsened over time. She experiences most increase in her pain with prolonged sitting and this has limited her from being able to participate in work events like commencement. She wants to be able to sit for longer periods of time without pain worsening.   PAIN:  Are you having pain? No  PRECAUTIONS: None  RED FLAGS: None   WEIGHT BEARING RESTRICTIONS: No  FALLS:  Has patient fallen in last 6 months? No  LIVING ENVIRONMENT: Lives with: lives alone Lives in: House/apartment Stairs: Yes: External: 4 steps; on right going up and on left going up Has following equipment at home: None  OCCUPATION: Nursing Instructor   PLOF: Independent  PATIENT GOALS: To avoid surgery and to relieve low back pain   NEXT MD VISIT: October 2024   OBJECTIVE:   VITALS: BP 125/77 HR 79 SpO2 99  DIAGNOSTIC FINDINGS:  No recent lumbar images from the past couple of years   PATIENT SURVEYS:   FOTO 67/100 with  target of 73   SCREENING FOR RED FLAGS: Bowel or bladder incontinence: No Spinal tumors: No Cauda equina syndrome: No Compression fracture: No Abdominal aneurysm: No  COGNITION: Overall cognitive status: Within functional limits for tasks assessed     SENSATION: WFL  MUSCLE LENGTH: Hamstrings: Right 70 deg; Left 70 deg Thomas test: Negative bilaterally  POSTURE: No Significant postural limitations  PALPATION: Left paraspinal   LUMBAR ROM:   AROM eval  Flexion 90%  Extension 100%  Right lateral flexion 100%  Left lateral flexion 100%  Right rotation 100%  Left rotation 100%   (Blank rows = not tested)  LOWER EXTREMITY ROM:     Active  Right eval Left eval  Hip flexion    Hip extension    Hip abduction    Hip adduction    Hip internal rotation    Hip external rotation    Knee flexion    Knee extension    Ankle dorsiflexion    Ankle plantarflexion    Ankle inversion    Ankle eversion     (Blank rows = not tested)  LOWER EXTREMITY MMT:    MMT Right eval Left eval  Hip flexion 4+ 4+  Hip extension 4 4  Hip abduction 4 4-  Hip adduction 4 4  Hip internal rotation    Hip external rotation    Knee flexion 4 4  Knee extension 4 4  Ankle dorsiflexion    Ankle plantarflexion    Ankle inversion    Ankle eversion     (Blank rows = not tested)  LUMBAR SPECIAL TESTS:  Straight leg raise test: Negative, SI Compression/distraction test: Negative, FABER test: Negative, and FADIR Negative   FUNCTIONAL TESTS:  None performed   GAIT: Distance walked: 50 ft  Assistive device utilized: None Level of assistance: Complete Independence Comments: No gait deficits noted   TODAY'S TREATMENT:                                                                                                                              DATE:   03/15/23:  THEREX  Matrix recumbent bicycle level 13 with 1 resistance for 6 min Lower Trunk Rotation 2 x 10   Single Knee to Chest 3 sec hold 1 x 10  Monster Walks 10 m x 6 with yellow band on knees   Monster Walks 10 M x 4 with yellow band on ankles Side Lying Hip Abduction with #3 AW  3 x 10     MANUAL  Trigger point release of left paraspinals     03/09/23: Matrix Recumbent Bicycle with level 4 resistance and seat at 11 for 5 min  Banded side step with yellow band 10 ft x 10  Sumo Squat with #15 Kettle bell 2 x 10  Good Morning with Yellow Band 1 x 10  -Pt reports increased low back pain  Good Morning with #15 KB 3 x 10  -mod  VC for increased hip hinge.    03/01/23: Nu-Step seat 11 and resistance 3  or 5 min  Bird Dog with forward lean over table 3 x 10  -Remove shoulder flexion for improved exercise tolerance.  Side Lying Hip Abduction against wall 3 x 10  Standing Marches #2 AW in corner of room 2 x 10    02/15/23: Matrix Recumbent Bicycle with level 3 resistance and seat at 9 for 5 min  Quadruped on elbows with contralateral hip and knee extension 3 x 10  Standing Marches with Shoulder Flexion against wall 1 x 10  Standing Marches with Shoulder Flexion away from wall 1 x 10  Standing Marches with Shoulder Flexion away from wall #1 DB 1 x 10  Standing Marches with Shoulder Flexion away from wall #2 DB 1 x 10    PATIENT EDUCATION:  Education details: form and technique for correct performance of exercise  Person educated: Patient Education method: Programmer, multimedia, Demonstration, Verbal cues, and Handouts Education comprehension: verbalized understanding, returned demonstration, and verbal cues required  HOME EXERCISE PROGRAM: Access Code: [email or web address] URL: [email or web address] Date: 03/09/2023 Prepared by: Ellin Goodie  Exercises - Standing Forward Trunk Flexion  - 1 x daily - 3 reps - 30 sec   hold - Sidelying ITB Stretch off Table  - 1 x daily - 3 reps - 30 sec hold - Hip Flexor Stretch with Chair  - 1 x daily - 3 reps - 30 sec  hold - Sidelying Hip  Abduction  - 3-4 x weekly - 3 sets - 10 reps - Standing March  - 3-4 x weekly - 3 sets - 10 reps - Sumo Squat with Dumbbell  - 1 x daily - 3-4 x weekly - 3 sets - 10 reps - Standing Good Morning with Barbell  - 3-4 x weekly - 3 sets - 10 reps  ASSESSMENT:  CLINICAL IMPRESSION: Despite pt starting session with increased pain, she was able to complete all strengthening exercises without an increase in her pain. Focused on hip strengthening with progression with added resistance and increased lever arm. PT will follow up with letter describing need for pt to have appropriate accommodations with work to avoid pain and symptom exacerbation. She will continue to benefit from skilled PT to improve hip strength and decrease low back pain.  OBJECTIVE IMPAIRMENTS: decreased ROM, decreased strength, obesity, and pain.   ACTIVITY LIMITATIONS: carrying, lifting, sitting, squatting, stairs, dressing, and locomotion level  PARTICIPATION LIMITATIONS: occupation and school  PERSONAL FACTORS: Past/current experiences and Time since onset of injury/illness/exacerbation are also affecting patient's functional outcome.   REHAB POTENTIAL: Fair h/o of msk issues and chronicity of low back pain   CLINICAL DECISION MAKING: Stable/uncomplicated  EVALUATION COMPLEXITY: Low   GOALS: Goals reviewed with patient? No  SHORT TERM GOALS: Target date: 02/03/2023  Pt will be independent with HEP in order to improve strength and balance in order to decrease fall risk and improve function at home and work. Baseline: NT 02/15/23: Able to perform independently  Goal status: Achieved   2.  Patient will understand directional preferences and activity modifications to avoid low back pain exacerbation. Baseline: Unable to perform  Goal status: ONGOING    LONG TERM GOALS: Target date: 03/31/2023  Patient will have improved function and activity level as evidenced by an increase in FOTO score by >=5 points or more.   Baseline: 67 pts with target of 73    Goal status: ONGOING    2.  Patient  will improve hip strength by 1/3 grade MMT (4- to 4) for improved lumbar stability and for symptom reduction.  Baseline: Hip Abd R/L 4/4, Hip Ext R/L 4/4 Goal status: ONGOING   3.  Patient will be able to sit for prolonged period of time without experiencing pain of >=3/10 NRPS to better be able to perform job related tasks.    Baseline: 5/10 NRPS Goal status: ONGOING   PLAN:  PT FREQUENCY: 1-2x/week  PT DURATION: 10 weeks  PLANNED INTERVENTIONS: Therapeutic exercises, Therapeutic activity, Neuromuscular re-education, Balance training, Gait training, Patient/Family education, Self Care, Joint mobilization, Joint manipulation, Aquatic Therapy, Dry Needling, Electrical stimulation, Spinal manipulation, Spinal mobilization, Cryotherapy, Moist heat, Manual therapy, and Re-evaluation.  PLAN FOR NEXT SESSION: Continue to progress hip strengthening exercises with obstacle step over and side stepping, progress marches with overhead press, good morning with resistance     Ellin Goodie PT, DPT  [facility] [address] [place], [address] Phone: [phone]   Fax:  [phone]

## 2023-03-18 ENCOUNTER — Encounter: Payer: BC Managed Care – PPO | Admitting: Physical Therapy

## 2023-03-23 ENCOUNTER — Ambulatory Visit: Payer: BC Managed Care – PPO | Admitting: Physical Therapy

## 2023-03-25 ENCOUNTER — Ambulatory Visit: Payer: BC Managed Care – PPO | Admitting: Physical Therapy

## 2023-03-25 ENCOUNTER — Encounter: Payer: BC Managed Care – PPO | Admitting: Physical Therapy

## 2023-03-25 DIAGNOSIS — M5459 Other low back pain: Secondary | ICD-10-CM | POA: Diagnosis not present

## 2023-03-25 DIAGNOSIS — G8929 Other chronic pain: Secondary | ICD-10-CM

## 2023-03-25 NOTE — Therapy (Signed)
OUTPATIENT PHYSICAL THERAPY THORACOLUMBAR DISCHARGE   Patient Name: Tina Fleming MRN: [id number] DOB:01/20/1981, 42 y.o., female Today's Date: 03/25/2023  END OF SESSION:  PT End of Session - 03/25/23 1428     Visit Number 8    Number of Visits 20    Date for PT Re-Evaluation 03/31/23    Authorization Type [id number]    Authorization Time Period 01/20/23-03/31/2023    Authorization - Visit Number 8    Authorization - Number of Visits 20    Progress Note Due on Visit 10    PT Start Time 1345    PT Stop Time 1415    PT Time Calculation (min) 30 min    Activity Tolerance Patient tolerated treatment well    Behavior During Therapy [facility] for tasks assessed/performed                 Past Medical History:  Diagnosis Date   Bilateral occipital neuralgia    Complication of anesthesia    hard to wake due to  prolonged sedation   DUB (dysfunctional uterine bleeding)    Heart murmur    per pt asymtomatic, dx age 84 and told had normal echo   History of 2019 novel coronavirus disease (COVID-19) 05/2018   per pt mild symptoms that resolved   History of kidney stones    History of urethral stricture    age 50 s/p dilatation   IBS (irritable bowel syndrome)    IDA (iron deficiency anemia)    Intractable migraine with aura without status migrainosus    neruologist--- dr h. Sherryll Burger   Thyroid nodule    per pt had biopsy of nodule approx. 2019 or 2020 , benign , and no follow up needed   Vitamin D deficiency    Past Surgical History:  Procedure Laterality Date   DILATATION & CURETTAGE/HYSTEROSCOPY WITH MYOSURE N/A 03/20/2016   Procedure: DILATATION & CURETTAGE/HYSTEROSCOPY WITH MYOSURE;  Surgeon: Maxie Better, MD;  Location: [facility];  Service: Gynecology;  Laterality: N/A;  45 min. requested   DILATATION & CURETTAGE/HYSTEROSCOPY WITH MYOSURE N/A 12/06/2020   Procedure: DILATATION & CURETTAGE/DIAGNOSTIC HYSTEROSCOPY/ HYSTEROSCOPIC RESECTION OF ENDOMETRIAL POLYP USING MYOSURE;   Surgeon: Maxie Better, MD;  Location: [facility];  Service: Gynecology;  Laterality: N/A;   EXTRACORPOREAL SHOCK WAVE LITHOTRIPSY  04/18/2013   @[facility]    EXTRACORPOREAL SHOCK WAVE LITHOTRIPSY Right 07/07/2021   Procedure: RIGHT EXTRACORPOREAL SHOCK WAVE LITHOTRIPSY (ESWL);  Surgeon: Crista Elliot, MD;  Location[facility];  Service: Urology;  Laterality: Right;   KNEE ARTHROSCOPY  04/24/2011   Procedure: ARTHROSCOPY KNEE;  Surgeon: Javier Docker;  Location: [facility];  Service: Orthopedics;  Laterality: Right;  /Arthroscopy with debridement, right knee   LAPAROSCOPIC APPENDECTOMY  03/21/2012   Procedure: APPENDECTOMY LAPAROSCOPIC;  Surgeon: Clovis Pu. Cornett, MD;  Location: [facility];  Service: General;  Laterality: N/A;   URETHRAL STRICTURE DILATATION     age 67   Patient Active Problem List   Diagnosis Date Noted   Sacral mass 06/11/2022   Genetic testing 02/16/2022   Obesity (BMI 35.0-39.9 without comorbidity) 09/21/2021   Iron deficiency anemia 04/11/2021   Abdominal pain 03/31/2021   Traumatic ecchymosis of right lower leg 03/23/2020   Hematoma of right lower extremity 03/23/2020   Nausea 08/21/2019   Abdominal bloating 08/21/2019   History of COVID-19 06/24/2019   URI (upper respiratory infection) 08/26/2018   Thyroid nodule 02/27/2018   Intractable migraine with aura  with status migrainosus 05/14/2017   Stress 01/10/2017   DUB (dysfunctional uterine bleeding) 07/16/2016   Low back pain 04/30/2015   Fatigue 02/08/2015   Skin lesion of breast 08/06/2014   Health care maintenance 08/06/2014   Nephrolithiasis 02/11/2013   Migraine 02/11/2013   History of frequent urinary tract infections 02/11/2013   Syncope 02/11/2013   IBS (irritable bowel syndrome) 02/11/2013   Anemia 02/11/2013   Vitamin D deficiency 02/11/2013    PCP: Dr. Dale Andover   REFERRING PROVIDER: Dr. Donalee Citrin   REFERRING DIAG: M51.26 (ICD-10-CM) -  Other intervertebral disc displacement, lumbar region  Rationale for Evaluation and Treatment: Rehabilitation  THERAPY DIAG:  Other low back pain  Chronic left SI joint pain  ONSET DATE: 2 mo from now, 04/22/23   SUBJECTIVE:                                                                                                                                                                                           SUBJECTIVE STATEMENT: Pt returning an informing us that this will be her last day because she will no longer be at her job and she is switching insurances.   Pt reports that she continues to have increased symptoms when sitting or walking for longer periods of time. She is concerned about next week when she has to start for    PERTINENT HISTORY:  Pt started to develop bilateral hip pain months ago which she thought was LE fatigue. Pt had steriod joint injection in L4-L5 back in July, with little to no relief. She came to find out that it was a radiating pain from her spine that has worsened over time. She experiences most increase in her pain with prolonged sitting and this has limited her from being able to participate in work events like commencement. She wants to be able to sit for longer periods of time without pain worsening.   PAIN:  Are you having pain? No  PRECAUTIONS: None  RED FLAGS: None   WEIGHT BEARING RESTRICTIONS: No  FALLS:  Has patient fallen in last 6 months? No  LIVING ENVIRONMENT: Lives with: lives alone Lives in: House/apartment Stairs: Yes: External: 4 steps; on right going up and on left going up Has following equipment at home: None  OCCUPATION: Nursing Instructor   PLOF: Independent  PATIENT GOALS: To avoid surgery and to relieve low back pain   NEXT MD VISIT: October 2024   OBJECTIVE:   VITALS: BP 125/77 HR 79 SpO2 99  DIAGNOSTIC FINDINGS:  No recent lumbar images from the past couple of years   PATIENT SURVEYS:  FOTO 67/100  with  target of 73   SCREENING FOR RED FLAGS: Bowel or bladder incontinence: No Spinal tumors: No Cauda equina syndrome: No Compression fracture: No Abdominal aneurysm: No  COGNITION: Overall cognitive status: Within functional limits for tasks assessed     SENSATION: WFL  MUSCLE LENGTH: Hamstrings: Right 70 deg; Left 70 deg Thomas test: Negative bilaterally  POSTURE: No Significant postural limitations  PALPATION: Left paraspinal   LUMBAR ROM:   AROM eval  Flexion 90%  Extension 100%  Right lateral flexion 100%  Left lateral flexion 100%  Right rotation 100%  Left rotation 100%   (Blank rows = not tested)  LOWER EXTREMITY ROM:     Active  Right eval Left eval  Hip flexion    Hip extension    Hip abduction    Hip adduction    Hip internal rotation    Hip external rotation    Knee flexion    Knee extension    Ankle dorsiflexion    Ankle plantarflexion    Ankle inversion    Ankle eversion     (Blank rows = not tested)  LOWER EXTREMITY MMT:    MMT Right eval Left eval  Hip flexion 4+ 4+  Hip extension 4 4  Hip abduction 4 4-  Hip adduction 4 4  Hip internal rotation    Hip external rotation    Knee flexion 4 4  Knee extension 4 4  Ankle dorsiflexion    Ankle plantarflexion    Ankle inversion    Ankle eversion     (Blank rows = not tested)  LUMBAR SPECIAL TESTS:  Straight leg raise test: Negative, SI Compression/distraction test: Negative, FABER test: Negative, and FADIR Negative   FUNCTIONAL TESTS:  None performed   GAIT: Distance walked: 50 ft  Assistive device utilized: None Level of assistance: Complete Independence Comments: No gait deficits noted   TODAY'S TREATMENT:                                                                                                                              DATE:   03/25/23: THEREX  FOTO 67  Review of home exercise plan- Posterior pelvic tilt 1 x 10 - 3 sec  Mini Squat 22 inch 1 x 10     03/15/23:  THEREX  Matrix recumbent bicycle level 13 with 1 resistance for 6 min Lower Trunk Rotation 2 x 10  Single Knee to Chest 3 sec hold 1 x 10  Monster Walks 10 m x 6 with yellow band on knees   Monster Walks 10 M x 4 with yellow band on ankles Side Lying Hip Abduction with #3 AW  3 x 10     MANUAL  Trigger point release of left paraspinals     03/09/23: Matrix Recumbent Bicycle with level 4 resistance and seat at 11 for 5 min  Banded side step with yellow band 10 ft x 10  Sumo Squat with #  15 Kettle bell 2 x 10  Good Morning with Yellow Band 1 x 10  -Pt reports increased low back pain  Good Morning with #15 KB 3 x 10  -mod VC for increased hip hinge.    03/01/23: Nu-Step seat 11 and resistance 3  or 5 min  Bird Dog with forward lean over table 3 x 10  -Remove shoulder flexion for improved exercise tolerance.  Side Lying Hip Abduction against wall 3 x 10  Standing Marches #2 AW in corner of room 2 x 10     PATIENT EDUCATION:  Education details: form and technique for correct performance of exercise  Person educated: Patient Education method: Explanation, Demonstration, Verbal cues, and Handouts Education comprehension: verbalized understanding, returned demonstration, and verbal cues required  HOME EXERCISE PROGRAM: Access Code: [email or web address] URL: [email or web address] Date: 03/25/2023 Prepared by: Ellin Goodie  Exercises - Standing Forward Trunk Flexion  - 1 x daily - 3 reps - 30 sec   hold - Sidelying ITB Stretch off Table  - 1 x daily - 3 reps - 30 sec hold - Hip Flexor Stretch with Chair  - 1 x daily - 3 reps - 30 sec  hold - Sidelying Hip Abduction  - 3-4 x weekly - 3 sets - 10 reps - Mini Squat  - 3-4 x weekly - 3 sets - 10 reps - Seated Posterior Pelvic Tilt  - 3-4 x weekly - 3 sets - 10 reps - 3sec  hold  ASSESSMENT:  CLINICAL IMPRESSION: Pt has not met all rehab goals but she needs to discharge due to insurance. She continues to  experience increased pain especially with seated positions. Pt is now being discharged due to loss in health insurance.    OBJECTIVE IMPAIRMENTS: decreased ROM, decreased strength, obesity, and pain.   ACTIVITY LIMITATIONS: carrying, lifting, sitting, squatting, stairs, dressing, and locomotion level  PARTICIPATION LIMITATIONS: occupation and school  PERSONAL FACTORS: Past/current experiences and Time since onset of injury/illness/exacerbation are also affecting patient's functional outcome.   REHAB POTENTIAL: Fair h/o of msk issues and chronicity of low back pain   CLINICAL DECISION MAKING: Stable/uncomplicated  EVALUATION COMPLEXITY: Low   GOALS: Goals reviewed with patient? No  SHORT TERM GOALS: Target date: 02/03/2023  Pt will be independent with HEP in order to improve strength and balance in order to decrease fall risk and improve function at home and work. Baseline: NT 02/15/23: Able to perform independently  Goal status: Achieved   2.  Patient will understand directional preferences and activity modifications to avoid low back pain exacerbation. Baseline: Unable to perform 03/25/23:  Goal status: ONGOING    LONG TERM GOALS: Target date: 03/31/2023  Patient will have improved function and activity level as evidenced by an increase in FOTO score by >=5 points or more.  Baseline: 67 pts with target of 73    Goal status: ONGOING    2.  Patient will improve hip strength by 1/3 grade MMT (4- to 4) for improved lumbar stability and for symptom reduction.  Baseline: Hip Abd R/L 4/4, Hip Ext R/L 4/4 Goal status: ONGOING   3.  Patient will be able to sit for prolonged period of time without experiencing pain of >=3/10 NRPS to better be able to perform job related tasks.    Baseline: 5/10 NRPS  03/25/23: NRPS 5/10  Goal status: NOT MET   PLAN:  PT FREQUENCY: 1-2x/week  PT DURATION: 10 weeks  PLANNED INTERVENTIONS: Therapeutic exercises, Therapeutic activity,  Neuromuscular re-education, Balance  training, Gait training, Patient/Family education, Self Care, Joint mobilization, Joint manipulation, Aquatic Therapy, Dry Needling, Electrical stimulation, Spinal manipulation, Spinal mobilization, Cryotherapy, Moist heat, Manual therapy, and Re-evaluation.  PLAN FOR NEXT SESSION: Discharging from physical therapy    Ellin Goodie PT, DPT  [facility] [address], [place], [address] Phone: [phone]   Fax:  [phone]

## 2023-03-29 ENCOUNTER — Ambulatory Visit: Payer: BC Managed Care – PPO | Admitting: Physical Therapy

## 2023-04-06 ENCOUNTER — Encounter: Payer: BC Managed Care – PPO | Admitting: Physical Therapy

## 2023-04-08 ENCOUNTER — Encounter: Payer: BC Managed Care – PPO | Admitting: Physical Therapy

## 2023-04-13 ENCOUNTER — Encounter: Payer: BC Managed Care – PPO | Admitting: Physical Therapy

## 2023-04-15 ENCOUNTER — Encounter: Payer: BC Managed Care – PPO | Admitting: Physical Therapy

## 2023-04-27 ENCOUNTER — Encounter: Payer: BC Managed Care – PPO | Admitting: Physical Therapy

## 2023-04-29 ENCOUNTER — Encounter: Payer: BC Managed Care – PPO | Admitting: Physical Therapy

## 2023-05-03 ENCOUNTER — Encounter: Payer: BC Managed Care – PPO | Admitting: Physical Therapy

## 2023-05-06 ENCOUNTER — Encounter: Payer: BC Managed Care – PPO | Admitting: Physical Therapy

## 2023-05-11 ENCOUNTER — Encounter: Payer: BC Managed Care – PPO | Admitting: Physical Therapy

## 2023-05-13 ENCOUNTER — Encounter: Payer: BC Managed Care – PPO | Admitting: Physical Therapy

## 2023-05-17 ENCOUNTER — Encounter: Payer: BC Managed Care – PPO | Admitting: Physical Therapy

## 2023-09-24 ENCOUNTER — Other Ambulatory Visit: Payer: Self-pay | Admitting: Obstetrics and Gynecology

## 2023-09-24 DIAGNOSIS — Z1231 Encounter for screening mammogram for malignant neoplasm of breast: Secondary | ICD-10-CM

## 2023-11-08 ENCOUNTER — Other Ambulatory Visit (HOSPITAL_COMMUNITY): Payer: Self-pay

## 2023-11-08 ENCOUNTER — Encounter: Payer: Self-pay | Admitting: Oncology

## 2023-11-08 MED ORDER — MEDROXYPROGESTERONE ACETATE 10 MG PO TABS
10.0000 mg | ORAL_TABLET | Freq: Every day | ORAL | 0 refills | Status: AC
  Filled 2023-11-08: qty 30, 30d supply, fill #0
